# Patient Record
Sex: Female | Born: 1960 | Race: White | Hispanic: No | Marital: Married | Smoking: Never smoker
Health system: Southern US, Community
[De-identification: ages and names within clinical notes are randomized; demographics above are authoritative.]

## PROBLEM LIST (undated history)

## (undated) DIAGNOSIS — Z9889 Other specified postprocedural states: Secondary | ICD-10-CM

## (undated) DIAGNOSIS — E119 Type 2 diabetes mellitus without complications: Secondary | ICD-10-CM

## (undated) DIAGNOSIS — R112 Nausea with vomiting, unspecified: Secondary | ICD-10-CM

## (undated) DIAGNOSIS — I10 Essential (primary) hypertension: Secondary | ICD-10-CM

## (undated) HISTORY — PX: BUNIONECTOMY: SHX129

## (undated) HISTORY — DX: Essential (primary) hypertension: I10

## (undated) HISTORY — DX: Type 2 diabetes mellitus without complications: E11.9

---

## 2007-02-14 ENCOUNTER — Ambulatory Visit: Payer: Self-pay | Admitting: Family Medicine

## 2007-04-06 ENCOUNTER — Ambulatory Visit: Payer: Self-pay | Admitting: Family Medicine

## 2017-01-05 DIAGNOSIS — Z23 Encounter for immunization: Secondary | ICD-10-CM | POA: Diagnosis not present

## 2018-02-20 ENCOUNTER — Other Ambulatory Visit
Admission: RE | Admit: 2018-02-20 | Discharge: 2018-02-20 | Disposition: A | Discharge status: Home / Self Care | Payer: Self-pay | Source: Ambulatory Visit | Attending: Internal Medicine | Admitting: Internal Medicine

## 2018-02-20 DIAGNOSIS — M48061 Spinal stenosis, lumbar region without neurogenic claudication: Secondary | ICD-10-CM | POA: Diagnosis not present

## 2018-02-20 DIAGNOSIS — M546 Pain in thoracic spine: Secondary | ICD-10-CM | POA: Insufficient documentation

## 2018-02-20 DIAGNOSIS — R739 Hyperglycemia, unspecified: Secondary | ICD-10-CM | POA: Diagnosis not present

## 2018-02-20 DIAGNOSIS — I10 Essential (primary) hypertension: Secondary | ICD-10-CM | POA: Diagnosis not present

## 2018-02-20 LAB — FIBRIN DERIVATIVES D-DIMER (ARMC ONLY): Fibrin derivatives D-dimer (ARMC): 287.41 ng/mL (FEU) (ref 0.00–499.00)

## 2018-03-06 DIAGNOSIS — E119 Type 2 diabetes mellitus without complications: Secondary | ICD-10-CM | POA: Diagnosis not present

## 2018-03-06 DIAGNOSIS — I10 Essential (primary) hypertension: Secondary | ICD-10-CM | POA: Diagnosis not present

## 2018-03-06 DIAGNOSIS — M545 Low back pain: Secondary | ICD-10-CM | POA: Diagnosis not present

## 2018-03-27 ENCOUNTER — Encounter: Payer: Self-pay | Admitting: Internal Medicine

## 2018-03-27 ENCOUNTER — Encounter (INDEPENDENT_AMBULATORY_CARE_PROVIDER_SITE_OTHER): Payer: Self-pay

## 2018-03-27 ENCOUNTER — Ambulatory Visit: Payer: BLUE CROSS/BLUE SHIELD | Admitting: Internal Medicine

## 2018-03-27 VITALS — BP 170/90 | HR 108 | Temp 98.0°F | Ht 66.75 in | Wt 203.0 lb

## 2018-03-27 DIAGNOSIS — Z23 Encounter for immunization: Secondary | ICD-10-CM | POA: Diagnosis not present

## 2018-03-27 DIAGNOSIS — E1159 Type 2 diabetes mellitus with other circulatory complications: Secondary | ICD-10-CM

## 2018-03-27 DIAGNOSIS — E119 Type 2 diabetes mellitus without complications: Secondary | ICD-10-CM | POA: Insufficient documentation

## 2018-03-27 DIAGNOSIS — I10 Essential (primary) hypertension: Secondary | ICD-10-CM | POA: Insufficient documentation

## 2018-03-27 MED ORDER — METFORMIN HCL 500 MG PO TABS: 500.0000 mg | ORAL_TABLET | Freq: Two times a day (BID) | ORAL | 0 refills | Status: DC

## 2018-03-27 MED ORDER — LISINOPRIL-HYDROCHLOROTHIAZIDE 20-12.5 MG PO TABS: 2.0000 | ORAL_TABLET | Freq: Every day | ORAL | 0 refills | Status: DC

## 2018-03-27 NOTE — Addendum Note (Signed)
Addended by: Lurlean Nanny on: 03/27/2018 12:52 PM   Modules accepted: Orders

## 2018-03-27 NOTE — Assessment & Plan Note (Signed)
Uncontrolled Change Lisinopril 20 mg to Lisinopril 20-12.5 mg, 2 tabs PO daily Reinforced DASH diet and exercise for weight loss Will check CMET at next visit  RTC in 2 weeks for HTN followup

## 2018-03-27 NOTE — Patient Instructions (Signed)
Diabetes Mellitus and Standards of Medical Care Managing diabetes (diabetes mellitus) can be complicated. Your diabetes treatment may be managed by a team of health care providers, including:  A physician who specializes in diabetes (endocrinologist).  A nurse practitioner or physician assistant.  Nurses.  A diet and nutrition specialist (registered dietitian).  A certified diabetes educator (CDE).  An exercise specialist.  A pharmacist.  An eye doctor.  A foot specialist (podiatrist).  A dentist.  A primary care provider.  A mental health provider. Your health care providers follow guidelines to help you get the best quality of care. The following schedule is a general guideline for your diabetes management plan. Your health care providers may give you more specific instructions. Physical exams Upon being diagnosed with diabetes mellitus, and each year after that, your health care provider will ask about your medical and family history. He or she will also do a physical exam. Your exam may include:  Measuring your height, weight, and body mass index (BMI).  Checking your blood pressure. This will be done at every routine medical visit. Your target blood pressure may vary depending on your medical conditions, your age, and other factors.  Thyroid gland exam.  Skin exam.  Screening for damage to your nerves (peripheral neuropathy). This may include checking the pulse in your legs and feet and checking the level of sensation in your hands and feet.  A complete foot exam to inspect the structure and skin of your feet, including checking for cuts, bruises, redness, blisters, sores, or other problems.  Screening for blood vessel (vascular) problems, which may include checking the pulse in your legs and feet and checking your temperature. Blood tests Depending on your treatment plan and your personal needs, you may have the following tests done:  HbA1c (hemoglobin A1c). This  test provides information about blood sugar (glucose) control over the previous 2-3 months. It is used to adjust your treatment plan, if needed. This test will be done: ? At least 2 times a year, if you are meeting your treatment goals. ? 4 times a year, if you are not meeting your treatment goals or if treatment goals have changed.  Lipid testing, including total, LDL, and HDL cholesterol and triglyceride levels. ? The goal for LDL is less than 100 mg/dL (5.5 mmol/L). If you are at high risk for complications, the goal is less than 70 mg/dL (3.9 mmol/L). ? The goal for HDL is 40 mg/dL (2.2 mmol/L) or higher for men and 50 mg/dL (2.8 mmol/L) or higher for women. An HDL cholesterol of 60 mg/dL (3.3 mmol/L) or higher gives some protection against heart disease. ? The goal for triglycerides is less than 150 mg/dL (8.3 mmol/L).  Liver function tests.  Kidney function tests.  Thyroid function tests. Dental and eye exams  Visit your dentist two times a year.  If you have type 1 diabetes, your health care provider may recommend an eye exam 3-5 years after you are diagnosed, and then once a year after your first exam. ? For children with type 1 diabetes, a health care provider may recommend an eye exam when your child is age 10 or older and has had diabetes for 3-5 years. After the first exam, your child should get an eye exam once a year.  If you have type 2 diabetes, your health care provider may recommend an eye exam as soon as you are diagnosed, and then once a year after your first exam. Immunizations   The  your first exam.  Immunizations    The yearly flu (influenza) vaccine is recommended for everyone 6 months or older who has diabetes.  The pneumonia (pneumococcal) vaccine is recommended for everyone 2 years or older who has diabetes. If you are 65 or older, you may get the pneumonia vaccine as a series of two separate shots.  The hepatitis B vaccine is recommended for adults shortly after being diagnosed with diabetes.  Adults and children with diabetes should receive all other vaccines  according to age-specific recommendations from the Centers for Disease Control and Prevention (CDC).  Mental and emotional health  Screening for symptoms of eating disorders, anxiety, and depression is recommended at the time of diagnosis and afterward as needed. If your screening shows that you have symptoms (positive screening result), you may need more evaluation and you may work with a mental health care provider.  Treatment plan  Your treatment plan will be reviewed at every medical visit. You and your health care provider will discuss:  How you are taking your medicines, including insulin.  Any side effects you are experiencing.  Your blood glucose target goals.  The frequency of your blood glucose monitoring.  Lifestyle habits, such as activity level as well as tobacco, alcohol, and substance use.  Diabetes self-management education  Your health care provider will assess how well you are monitoring your blood glucose levels and whether you are taking your insulin correctly. He or she may refer you to:  A certified diabetes educator to manage your diabetes throughout your life, starting at diagnosis.  A registered dietitian who can create or review your personal nutrition plan.  An exercise specialist who can discuss your activity level and exercise plan.  Summary  Managing diabetes (diabetes mellitus) can be complicated. Your diabetes treatment may be managed by a team of health care providers.  Your health care providers follow guidelines in order to help you get the best quality of care.  Standards of care including having regular physical exams, blood tests, blood pressure monitoring, immunizations, screening tests, and education about how to manage your diabetes.  Your health care providers may also give you more specific instructions based on your individual health.  This information is not intended to replace advice given to you by your health care provider. Make sure you discuss any questions you have  with your health care provider.  Document Released: 01/10/2009 Document Revised: 12/02/2017 Document Reviewed: 12/12/2015  Elsevier Interactive Patient Education  2019 [employer]

## 2018-03-27 NOTE — Assessment & Plan Note (Signed)
Uncontrolled She is not interested in getting a meter at this time Continue Metformin, refilled today No microalbumin needed secondary to ACEI  therapy Discussed low carb diet and exercise for weight loss Foot exam today Flu and pneumovax today Encouraged her to make an appt for an annual eye exam

## 2018-03-27 NOTE — Progress Notes (Signed)
HPI  Pt presents to the clinic today to establish care and for management of the conditions listed below.  HTN: Diagnosed 01/2018. She is taking Lisinopril as prescribed. Her BP today is 170/90 . She has had a slight scratchy throat but denies cough. There is no ECG on file.  DM 2: New diagnosis 01/2018. A1C 11.8, 02/20/2018. She was started on Metformin 500 mg BID, which she has been taking as prescribed. She has had some mild GI upset but nothing that is worrisome. She is not checking her sugars. She reports numbness and tingling in hands and feet. She denies visual changes, increased thirst or urinary frequency. She checks her feet daily. She has never had a pneumonia shot. Her last eye exam was > 1 year ago.  Flu: 12/2016 Tetanus: > 10 years ago Pneumovax: never Pap Smear: > 5 years ago Mammogram: > 5 years ago Colon Screening: never Vision Screening: as needed Dentist: biannually  Past Medical History:  Diagnosis Date  . Diabetes mellitus without complication ([place])   . Hypertension     Current Outpatient Medications  Medication Sig Dispense Refill  . lisinopril (PRINIVIL,ZESTRIL) 20 MG tablet Take by mouth.    . metFORMIN (GLUCOPHAGE) 500 MG tablet Take 500 mg by mouth 2 (two) times daily with a meal.      No current facility-administered medications for this visit.     No Known Allergies  Family History  Problem Relation Age of Onset  . Heart disease Father   . Heart attack Father   . Heart disease Maternal Grandfather     Social History   Socioeconomic History  . Marital status: Single    Spouse name: Not on file  . Number of children: Not on file  . Years of education: Not on file  . Highest education level: Not on file  Occupational History  . Not on file  Social Needs  . Financial resource strain: Not on file  . Food insecurity:    Worry: Not on file    Inability: Not on file  . Transportation needs:    Medical: Not on file    Non-medical: Not on  file  Tobacco Use  . Smoking status: Never Smoker  . Smokeless tobacco: Never Used  Substance and Sexual Activity  . Alcohol use: Yes    Comment: recently quit 01/2018, 3-4 beers QD  . Drug use: Never  . Sexual activity: Not on file  Lifestyle  . Physical activity:    Days per week: Not on file    Minutes per session: Not on file  . Stress: Not on file  Relationships  . Social connections:    Talks on phone: Not on file    Gets together: Not on file    Attends religious service: Not on file    Active member of club or organization: Not on file    Attends meetings of clubs or organizations: Not on file    Relationship status: Not on file  . Intimate partner violence:    Fear of current or ex partner: Not on file    Emotionally abused: Not on file    Physically abused: Not on file    Forced sexual activity: Not on file  Other Topics Concern  . Not on file  Social History Narrative  . Not on file    ROS:  Constitutional: Pt reports intermittent fatigue. Denies fever, malaise, headache or abrupt weight changes.  HEENT: Denies eye pain, eye redness, ear pain,  ringing in the ears, wax buildup, runny nose, nasal congestion, bloody nose, or sore throat. Respiratory: Denies difficulty breathing, shortness of breath, cough or sputum production.   Cardiovascular: Denies chest pain, chest tightness, palpitations or swelling in the hands or feet.  Gastrointestinal: Denies abdominal pain, bloating, constipation, diarrhea or blood in the stool.  GU: Denies frequency, urgency, pain with urination, blood in urine, odor or discharge. Musculoskeletal: Denies decrease in range of motion, difficulty with gait, muscle pain or joint pain and swelling.  Skin: Denies redness, rashes, lesions or ulcercations.  Neurological: Pt reports intermittent lightheadedness, numbness and tingling of hands and feet. Denies dizziness, difficulty with memory, difficulty with speech or problems with balance and  coordination.  Psych: Denies anxiety, depression, SI/HI.  No other specific complaints in a complete review of systems (except as listed in HPI above).  PE:  BP (!) 170/90   Pulse (!) 108   Temp 98 F (36.7 C) (Oral)   Ht 5' 6.75" (1.695 m)   Wt 203 lb (92.1 kg)   SpO2 98%   BMI 32.03 kg/m   Wt Readings from Last 3 Encounters:  03/27/18 203 lb (92.1 kg)    General: Appears her stated age, obese, in NAD. Skin: Vascular changes noted of BLE. Scab noted of left lateral lower leg. HEENT: Head: normal shape and size; Eyes: sclera white, no icterus, conjunctiva pink, PERRLA and EOMs intact;  Cardiovascular: Normal rate and rhythm. S1,S2 noted.  No murmur, rubs or gallops noted. No JVD or BLE edema. No carotid bruits noted. Pulmonary/Chest: Normal effort and positive vesicular breath sounds. No respiratory distress. No wheezes, rales or ronchi noted.  Abdomen: Soft and nontender. Normal bowel sounds. Neurological: Alert and oriented. Sensation intact to BLE. Psychiatric: Mood and affect normal. Behavior is normal. Judgment and thought content normal.     Assessment and Plan:

## 2018-04-10 ENCOUNTER — Ambulatory Visit: Payer: BLUE CROSS/BLUE SHIELD | Admitting: Internal Medicine

## 2018-04-10 ENCOUNTER — Encounter: Payer: Self-pay | Admitting: Internal Medicine

## 2018-04-10 VITALS — BP 160/90 | HR 105 | Temp 98.1°F | Wt 198.0 lb

## 2018-04-10 DIAGNOSIS — I10 Essential (primary) hypertension: Secondary | ICD-10-CM

## 2018-04-10 LAB — BASIC METABOLIC PANEL
BUN: 29 mg/dL — AB (ref 6–23)
CALCIUM: 9.2 mg/dL (ref 8.4–10.5)
CO2: 29 mEq/L (ref 19–32)
CREATININE: 0.91 mg/dL (ref 0.40–1.20)
Chloride: 93 mEq/L — ABNORMAL LOW (ref 96–112)
GFR: 67.6 mL/min (ref >60.00)
Glucose, Bld: 481 mg/dL — ABNORMAL HIGH (ref 70–99)
Potassium: 4.2 mEq/L (ref 3.5–5.1)
Sodium: 130 mEq/L — ABNORMAL LOW (ref 135–145)

## 2018-04-10 MED ORDER — LOSARTAN POTASSIUM 50 MG PO TABS: 50.0000 mg | ORAL_TABLET | Freq: Every day | ORAL | 0 refills | Status: DC

## 2018-04-10 NOTE — Progress Notes (Signed)
Subjective:    Patient ID: Rebecca Park, female    DOB: [birth year], 58 y.o.   MRN: [id number]  HPI  Pt presents to the clinic today for 2 week follow up of HTN. At her last visit, her Lisinopril was changed to Lisinopril 20-12.5 mg tabs, take 2 tabs daily. She has been taking the medication as prescribed. She denies adverse side effects. Her BP today is 160/90. She reports she took her blood pressure medication about 1 hour ago. There is no ECG on file but she recently had one at [facility].  Review of Systems      Past Medical History:  Diagnosis Date  . Diabetes mellitus without complication ([place])   . Hypertension     Current Outpatient Medications  Medication Sig Dispense Refill  . lisinopril (PRINIVIL,ZESTRIL) 20 MG tablet Take by mouth.    Marland Kitchen lisinopril-hydrochlorothiazide (ZESTORETIC) 20-12.5 MG tablet Take 2 tablets by mouth daily. 60 tablet 0  . metFORMIN (GLUCOPHAGE) 500 MG tablet Take 1 tablet (500 mg total) by mouth 2 (two) times daily with a meal. 180 tablet 0   No current facility-administered medications for this visit.     No Known Allergies  Family History  Problem Relation Age of Onset  . Heart disease Father   . Heart attack Father   . Heart disease Maternal Grandfather     Social History   Socioeconomic History  . Marital status: Single    Spouse name: Not on file  . Number of children: Not on file  . Years of education: Not on file  . Highest education level: Not on file  Occupational History  . Not on file  Social Needs  . Financial resource strain: Not on file  . Food insecurity:    Worry: Not on file    Inability: Not on file  . Transportation needs:    Medical: Not on file    Non-medical: Not on file  Tobacco Use  . Smoking status: Never Smoker  . Smokeless tobacco: Never Used  Substance and Sexual Activity  . Alcohol use: Yes    Comment: recently quit 01/2018, 3-4 beers QD  . Drug use: Never  . Sexual activity: Not on file    Lifestyle  . Physical activity:    Days per week: Not on file    Minutes per session: Not on file  . Stress: Not on file  Relationships  . Social connections:    Talks on phone: Not on file    Gets together: Not on file    Attends religious service: Not on file    Active member of club or organization: Not on file    Attends meetings of clubs or organizations: Not on file    Relationship status: Not on file  . Intimate partner violence:    Fear of current or ex partner: Not on file    Emotionally abused: Not on file    Physically abused: Not on file    Forced sexual activity: Not on file  Other Topics Concern  . Not on file  Social History Narrative  . Not on file     Constitutional: Pt reports fatigue. Denies fever, malaise, headache or abrupt weight changes.  Respiratory: Denies difficulty breathing, shortness of breath, cough or sputum production.   Cardiovascular: Denies chest pain, chest tightness, palpitations or swelling in the hands or feet.  Neurological: Denies dizziness, difficulty with memory, difficulty with speech or problems with balance and coordination.  No other specific complaints in a complete review of systems (except as listed in HPI above).  Objective:   Physical Exam   BP (!) 160/90   Pulse (!) 105   Temp 98.1 F (36.7 C) (Oral)   Wt 198 lb (89.8 kg)   SpO2 98%   BMI 31.24 kg/m  Wt Readings from Last 3 Encounters:  04/10/18 198 lb (89.8 kg)  03/27/18 203 lb (92.1 kg)    General: Appears her stated age, obese, in NAD. Cardiovascular: Tachycardic with normal rhythm. S1,S2 noted.  No murmur, rubs or gallops noted. No JVD or BLE edema.  Pulmonary/Chest: Normal effort and positive vesicular breath sounds. No respiratory distress. No wheezes, rales or ronchi noted.  Neurological: Alert and oriented.       Assessment & Plan:

## 2018-04-10 NOTE — Assessment & Plan Note (Signed)
Uncontrolled on Lisinopril HCT Will change to Losartan 50 mg daily Reinforced DASH diet and exercise for weight loss BMET today Will request record of ECG from [facility]  RTC in 2 weeks for BP check

## 2018-04-10 NOTE — Patient Instructions (Signed)

## 2018-04-12 ENCOUNTER — Encounter: Payer: Self-pay | Admitting: Internal Medicine

## 2018-04-13 ENCOUNTER — Encounter: Payer: Self-pay | Admitting: Internal Medicine

## 2018-04-13 MED ORDER — METFORMIN HCL 1000 MG PO TABS: 1000.0000 mg | ORAL_TABLET | Freq: Two times a day (BID) | ORAL | 0 refills | Status: DC

## 2018-04-19 ENCOUNTER — Other Ambulatory Visit: Payer: Self-pay | Admitting: Internal Medicine

## 2018-04-24 ENCOUNTER — Encounter: Payer: Self-pay | Admitting: Internal Medicine

## 2018-04-24 ENCOUNTER — Ambulatory Visit: Payer: BLUE CROSS/BLUE SHIELD | Admitting: Internal Medicine

## 2018-04-24 VITALS — BP 152/94 | HR 100 | Temp 98.2°F | Wt 203.0 lb

## 2018-04-24 DIAGNOSIS — I10 Essential (primary) hypertension: Secondary | ICD-10-CM | POA: Diagnosis not present

## 2018-04-24 MED ORDER — LOSARTAN POTASSIUM 100 MG PO TABS: 100.0000 mg | ORAL_TABLET | Freq: Every day | ORAL | 1 refills | Status: DC

## 2018-04-24 MED ORDER — AMLODIPINE BESYLATE 5 MG PO TABS: 5.0000 mg | ORAL_TABLET | Freq: Every day | ORAL | 1 refills | Status: DC

## 2018-04-24 NOTE — Patient Instructions (Signed)

## 2018-04-24 NOTE — Assessment & Plan Note (Signed)
Still not controlled Continue Losartan 100 mg daily, RX sent to pharmacy Consider changing to Losartan- HCT if edema persists or worsens Add Amlodipine 5 mg daily, RX sent to pharmacy Will obtain US to check for renal artery stenosis Reinforced DASH diet and exercise for weight loss Update me via mychart in 1 week with BP readings  RTC in 1 month for your annual exam

## 2018-04-24 NOTE — Progress Notes (Signed)
Subjective:    Patient ID: Rebecca Park, female    DOB: 1961-02-16, 58 y.o.   MRN: [id number]  HPI  Pt presents to the clinic today for follow up of HTN. At her last visit, she was started on Losartan 50 mg. She initially felt a little lightheaded and felt like her blood pressures were low, she she stopped the medication after only a few days. I advised her to cut Losartan in half, but then she noticed her blood pressures were elevated. She increased her Losartan to 50 mg and BP at home was 152/90, 170/92. I advised her to start taking 2 tabs of Losartan. She has been taking the medication as prescribed. She denies adverse side effects. She has noticed some mild swelling in her legs since coming off the Lisinopril HCT but it is not painful. She denies cough or SOB. Her BP today is 152/94. There is no ECG on file.   Review of Systems       Past Medical History:  Diagnosis Date  . Diabetes mellitus without complication ([place])   . Hypertension     Current Outpatient Medications  Medication Sig Dispense Refill  . losartan (COZAAR) 50 MG tablet Take 1 tablet (50 mg total) by mouth daily. 30 tablet 0  . metFORMIN (GLUCOPHAGE) 1000 MG tablet Take 1 tablet (1,000 mg total) by mouth 2 (two) times daily with a meal. 180 tablet 0   No current facility-administered medications for this visit.     No Known Allergies  Family History  Problem Relation Age of Onset  . Heart disease Father   . Heart attack Father   . Heart disease Maternal Grandfather     Social History   Socioeconomic History  . Marital status: Single    Spouse name: Not on file  . Number of children: Not on file  . Years of education: Not on file  . Highest education level: Not on file  Occupational History  . Not on file  Social Needs  . Financial resource strain: Not on file  . Food insecurity:    Worry: Not on file    Inability: Not on file  . Transportation needs:    Medical: Not on file    Non-medical: Not  on file  Tobacco Use  . Smoking status: Never Smoker  . Smokeless tobacco: Never Used  Substance and Sexual Activity  . Alcohol use: Yes    Comment: recently quit 01/2018, 3-4 beers QD  . Drug use: Never  . Sexual activity: Not on file  Lifestyle  . Physical activity:    Days per week: Not on file    Minutes per session: Not on file  . Stress: Not on file  Relationships  . Social connections:    Talks on phone: Not on file    Gets together: Not on file    Attends religious service: Not on file    Active member of club or organization: Not on file    Attends meetings of clubs or organizations: Not on file    Relationship status: Not on file  . Intimate partner violence:    Fear of current or ex partner: Not on file    Emotionally abused: Not on file    Physically abused: Not on file    Forced sexual activity: Not on file  Other Topics Concern  . Not on file  Social History Narrative  . Not on file     Constitutional: Denies fever, malaise,  fatigue, headache or abrupt weight changes.  Respiratory: Denies difficulty breathing, shortness of breath, cough or sputum production.   Cardiovascular: Pt reports mild swelling in legs. Denies chest pain, chest tightness, palpitations or swelling in the hands.  Neurological: Denies dizziness, difficulty with memory, difficulty with speech or problems with balance and coordination.    No other specific complaints in a complete review of systems (except as listed in HPI above).  Objective:   Physical Exam  BP (!) 152/94   Pulse 100   Temp 98.2 F (36.8 C) (Oral)   Wt 203 lb (92.1 kg)   SpO2 98%   BMI 32.03 kg/m  Wt Readings from Last 3 Encounters:  04/24/18 203 lb (92.1 kg)  04/10/18 198 lb (89.8 kg)  03/27/18 203 lb (92.1 kg)    General: Appears her stated age, obese, in NAD. Cardiovascular: Tachycardic with normal rhythm. S1,S2 noted.  No murmur, rubs or gallops noted. Trace BLE edema. Pulmonary/Chest: Normal effort and  positive vesicular breath sounds. No respiratory distress. No wheezes, rales or ronchi noted.  Neurological: Alert and oriented.    BMET    Component Value Date/Time   NA 130 (L) 04/10/2018 0828   K 4.2 04/10/2018 0828   CL 93 (L) 04/10/2018 0828   CO2 29 04/10/2018 0828   GLUCOSE 481 (H) 04/10/2018 0828   BUN 29 (H) 04/10/2018 0828   CREATININE 0.91 04/10/2018 0828   CALCIUM 9.2 04/10/2018 0828    Lipid Panel  No results found for: CHOL, TRIG, HDL, CHOLHDL, VLDL, LDLCALC  CBC No results found for: WBC, RBC, HGB, HCT, PLT, MCV, MCH, MCHC, RDW, LYMPHSABS, MONOABS, EOSABS, BASOSABS  Hgb A1C No results found for: HGBA1C          Assessment & Plan:

## 2018-04-25 ENCOUNTER — Encounter: Payer: Self-pay | Admitting: Internal Medicine

## 2018-04-28 ENCOUNTER — Encounter: Payer: Self-pay | Admitting: Internal Medicine

## 2018-05-02 ENCOUNTER — Other Ambulatory Visit: Payer: Self-pay | Admitting: Internal Medicine

## 2018-05-16 ENCOUNTER — Other Ambulatory Visit: Payer: Self-pay | Admitting: Internal Medicine

## 2018-06-06 ENCOUNTER — Ambulatory Visit (INDEPENDENT_AMBULATORY_CARE_PROVIDER_SITE_OTHER): Payer: BLUE CROSS/BLUE SHIELD | Admitting: Internal Medicine

## 2018-06-06 ENCOUNTER — Encounter: Payer: Self-pay | Admitting: Internal Medicine

## 2018-06-06 VITALS — BP 146/100 | HR 110 | Temp 97.9°F | Ht 66.75 in | Wt 212.0 lb

## 2018-06-06 DIAGNOSIS — Z114 Encounter for screening for human immunodeficiency virus [HIV]: Secondary | ICD-10-CM

## 2018-06-06 DIAGNOSIS — E1159 Type 2 diabetes mellitus with other circulatory complications: Secondary | ICD-10-CM

## 2018-06-06 DIAGNOSIS — Z0001 Encounter for general adult medical examination with abnormal findings: Secondary | ICD-10-CM | POA: Diagnosis not present

## 2018-06-06 DIAGNOSIS — I10 Essential (primary) hypertension: Secondary | ICD-10-CM | POA: Diagnosis not present

## 2018-06-06 DIAGNOSIS — Z1211 Encounter for screening for malignant neoplasm of colon: Secondary | ICD-10-CM | POA: Diagnosis not present

## 2018-06-06 DIAGNOSIS — Z23 Encounter for immunization: Secondary | ICD-10-CM

## 2018-06-06 DIAGNOSIS — Z1239 Encounter for other screening for malignant neoplasm of breast: Secondary | ICD-10-CM

## 2018-06-06 DIAGNOSIS — Z1159 Encounter for screening for other viral diseases: Secondary | ICD-10-CM

## 2018-06-06 LAB — COMPREHENSIVE METABOLIC PANEL
ALT: 20 U/L (ref 0–35)
AST: 14 U/L (ref 0–37)
Albumin: 4.2 g/dL (ref 3.5–5.2)
Alkaline Phosphatase: 139 U/L — ABNORMAL HIGH (ref 39–117)
BUN: 19 mg/dL (ref 6–23)
CO2: 26 mEq/L (ref 19–32)
Calcium: 9.4 mg/dL (ref 8.4–10.5)
Chloride: 98 mEq/L (ref 96–112)
Creatinine, Ser: 0.64 mg/dL (ref 0.40–1.20)
GFR: 95.42 mL/min (ref >60.00)
GLUCOSE: 361 mg/dL — AB (ref 70–99)
Potassium: 4 mEq/L (ref 3.5–5.1)
Sodium: 133 mEq/L — ABNORMAL LOW (ref 135–145)
Total Bilirubin: 0.6 mg/dL (ref 0.2–1.2)
Total Protein: 7.2 g/dL (ref 6.0–8.3)

## 2018-06-06 LAB — LIPID PANEL
CHOL/HDL RATIO: 3
Cholesterol: 245 mg/dL — ABNORMAL HIGH (ref 0–200)
HDL: 75.7 mg/dL (ref >39.00)
LDL Cholesterol: 150 mg/dL — ABNORMAL HIGH (ref 0–99)
NonHDL: 169.2
Triglycerides: 98 mg/dL (ref 0.0–149.0)
VLDL: 19.6 mg/dL (ref 0.0–40.0)

## 2018-06-06 LAB — VITAMIN D 25 HYDROXY (VIT D DEFICIENCY, FRACTURES): VITD: 18.99 ng/mL — ABNORMAL LOW (ref 30.00–100.00)

## 2018-06-06 LAB — CBC
HCT: 39.4 % (ref 36.0–46.0)
Hemoglobin: 13.4 g/dL (ref 12.0–15.0)
MCHC: 34 g/dL (ref 30.0–36.0)
MCV: 87.3 fl (ref 78.0–100.0)
Platelets: 155 10*3/uL (ref 150.0–400.0)
RBC: 4.52 Mil/uL (ref 3.87–5.11)
RDW: 13.6 % (ref 11.5–15.5)
WBC: 7.2 10*3/uL (ref 4.0–10.5)

## 2018-06-06 LAB — HEMOGLOBIN A1C: Hgb A1c MFr Bld: 11.1 % — ABNORMAL HIGH (ref 4.6–6.5)

## 2018-06-06 MED ORDER — HYDROCHLOROTHIAZIDE 25 MG PO TABS: 25.0000 mg | ORAL_TABLET | Freq: Every day | ORAL | 3 refills | Status: DC

## 2018-06-06 NOTE — Patient Instructions (Signed)
Health Maintenance for Postmenopausal Women Menopause is a normal process in which your reproductive ability comes to an end. This process happens gradually over a span of months to years, usually between the ages of 62 and 89. Menopause is complete when you have missed 12 consecutive menstrual periods. It is important to talk with your health care provider about some of the most common conditions that affect postmenopausal women, such as heart disease, cancer, and bone loss (osteoporosis). Adopting a healthy lifestyle and getting preventive care can help to promote your health and wellness. Those actions can also lower your chances of developing some of these common conditions. What should I know about menopause? During menopause, you may experience a number of symptoms, such as:  Moderate-to-severe hot flashes.  Night sweats.  Decrease in sex drive.  Mood swings.  Headaches.  Tiredness.  Irritability.  Memory problems.  Insomnia. Choosing to treat or not to treat menopausal changes is an individual decision that you make with your health care provider. What should I know about hormone replacement therapy and supplements? Hormone therapy products are effective for treating symptoms that are associated with menopause, such as hot flashes and night sweats. Hormone replacement carries certain risks, especially as you become older. If you are thinking about using estrogen or estrogen with progestin treatments, discuss the benefits and risks with your health care provider. What should I know about heart disease and stroke? Heart disease, heart attack, and stroke become more likely as you age. This may be due, in part, to the hormonal changes that your body experiences during menopause. These can affect how your body processes dietary fats, triglycerides, and cholesterol. Heart attack and stroke are both medical emergencies. There are many things that you can do to help prevent heart disease  and stroke:  Have your blood pressure checked at least every 1-2 years. High blood pressure causes heart disease and increases the risk of stroke.  If you are 79-72 years old, ask your health care provider if you should take aspirin to prevent a heart attack or a stroke.  Do not use any tobacco products, including cigarettes, chewing tobacco, or electronic cigarettes. If you need help quitting, ask your health care provider.  It is important to eat a healthy diet and maintain a healthy weight. ? Be sure to include plenty of vegetables, fruits, low-fat dairy products, and lean protein. ? Avoid eating foods that are high in solid fats, added sugars, or salt (sodium).  Get regular exercise. This is one of the most important things that you can do for your health. ? Try to exercise for at least 150 minutes each week. The type of exercise that you do should increase your heart rate and make you sweat. This is known as moderate-intensity exercise. ? Try to do strengthening exercises at least twice each week. Do these in addition to the moderate-intensity exercise.  Know your numbers.Ask your health care provider to check your cholesterol and your blood glucose. Continue to have your blood tested as directed by your health care provider.  What should I know about cancer screening? There are several types of cancer. Take the following steps to reduce your risk and to catch any cancer development as early as possible. Breast Cancer  Practice breast self-awareness. ? This means understanding how your breasts normally appear and feel. ? It also means doing regular breast self-exams. Let your health care provider know about any changes, no matter how small.  If you are 40 or  older, have a clinician do a breast exam (clinical breast exam or CBE) every year. Depending on your age, family history, and medical history, it may be recommended that you also have a yearly breast X-ray (mammogram).  If you  have a family history of breast cancer, talk with your health care provider about genetic screening.  If you are at high risk for breast cancer, talk with your health care provider about having an MRI and a mammogram every year.  Breast cancer (BRCA) gene test is recommended for women who have family members with BRCA-related cancers. Results of the assessment will determine the need for genetic counseling and BRCA1 and for BRCA2 testing. BRCA-related cancers include these types: ? Breast. This occurs in males or females. ? Ovarian. ? Tubal. This may also be called fallopian tube cancer. ? Cancer of the abdominal or pelvic lining (peritoneal cancer). ? Prostate. ? Pancreatic. Cervical, Uterine, and Ovarian Cancer Your health care provider may recommend that you be screened regularly for cancer of the pelvic organs. These include your ovaries, uterus, and vagina. This screening involves a pelvic exam, which includes checking for microscopic changes to the surface of your cervix (Pap test).  For women ages 21-65, health care providers may recommend a pelvic exam and a Pap test every three years. For women ages 39-65, they may recommend the Pap test and pelvic exam, combined with testing for human papilloma virus (HPV), every five years. Some types of HPV increase your risk of cervical cancer. Testing for HPV may also be done on women of any age who have unclear Pap test results.  Other health care providers may not recommend any screening for nonpregnant women who are considered low risk for pelvic cancer and have no symptoms. Ask your health care provider if a screening pelvic exam is right for you.  If you have had past treatment for cervical cancer or a condition that could lead to cancer, you need Pap tests and screening for cancer for at least 20 years after your treatment. If Pap tests have been discontinued for you, your risk factors (such as having a new sexual partner) need to be reassessed  to determine if you should start having screenings again. Some women have medical problems that increase the chance of getting cervical cancer. In these cases, your health care provider may recommend that you have screening and Pap tests more often.  If you have a family history of uterine cancer or ovarian cancer, talk with your health care provider about genetic screening.  If you have vaginal bleeding after reaching menopause, tell your health care provider.  There are currently no reliable tests available to screen for ovarian cancer. Lung Cancer Lung cancer screening is recommended for adults 57-50 years old who are at high risk for lung cancer because of a history of smoking. A yearly low-dose CT scan of the lungs is recommended if you:  Currently smoke.  Have a history of at least 30 pack-years of smoking and you currently smoke or have quit within the past 15 years. A pack-year is smoking an average of one pack of cigarettes per day for one year. Yearly screening should:  Continue until it has been 15 years since you quit.  Stop if you develop a health problem that would prevent you from having lung cancer treatment. Colorectal Cancer  This type of cancer can be detected and can often be prevented.  Routine colorectal cancer screening usually begins at age 12 and continues through  age 75.  If you have risk factors for colon cancer, your health care provider may recommend that you be screened at an earlier age.  If you have a family history of colorectal cancer, talk with your health care provider about genetic screening.  Your health care provider may also recommend using home test kits to check for hidden blood in your stool.  A small camera at the end of a tube can be used to examine your colon directly (sigmoidoscopy or colonoscopy). This is done to check for the earliest forms of colorectal cancer.  Direct examination of the colon should be repeated every 5-10 years until  age 75. However, if early forms of precancerous polyps or small growths are found or if you have a family history or genetic risk for colorectal cancer, you may need to be screened more often. Skin Cancer  Check your skin from head to toe regularly.  Monitor any moles. Be sure to tell your health care provider: ? About any new moles or changes in moles, especially if there is a change in a mole's shape or color. ? If you have a mole that is larger than the size of a pencil eraser.  If any of your family members has a history of skin cancer, especially at a young age, talk with your health care provider about genetic screening.  Always use sunscreen. Apply sunscreen liberally and repeatedly throughout the day.  Whenever you are outside, protect yourself by wearing long sleeves, pants, a wide-brimmed hat, and sunglasses. What should I know about osteoporosis? Osteoporosis is a condition in which bone destruction happens more quickly than new bone creation. After menopause, you may be at an increased risk for osteoporosis. To help prevent osteoporosis or the bone fractures that can happen because of osteoporosis, the following is recommended:  If you are 19-50 years old, get at least 1,000 mg of calcium and at least 600 mg of vitamin D per day.  If you are older than age 50 but younger than age 70, get at least 1,200 mg of calcium and at least 600 mg of vitamin D per day.  If you are older than age 70, get at least 1,200 mg of calcium and at least 800 mg of vitamin D per day. Smoking and excessive alcohol intake increase the risk of osteoporosis. Eat foods that are rich in calcium and vitamin D, and do weight-bearing exercises several times each week as directed by your health care provider. What should I know about how menopause affects my mental health? Depression may occur at any age, but it is more common as you become older. Common symptoms of depression include:  Low or sad  mood.  Changes in sleep patterns.  Changes in appetite or eating patterns.  Feeling an overall lack of motivation or enjoyment of activities that you previously enjoyed.  Frequent crying spells. Talk with your health care provider if you think that you are experiencing depression. What should I know about immunizations? It is important that you get and maintain your immunizations. These include:  Tetanus, diphtheria, and pertussis (Tdap) booster vaccine.  Influenza every year before the flu season begins.  Pneumonia vaccine.  Shingles vaccine. Your health care provider may also recommend other immunizations. This information is not intended to replace advice given to you by your health care provider. Make sure you discuss any questions you have with your health care provider. Document Released: 05/07/2005 Document Revised: 10/03/2015 Document Reviewed: 12/17/2014 Elsevier Interactive Patient Education    2019 [place].

## 2018-06-06 NOTE — Assessment & Plan Note (Signed)
BP's much better at home, she will continue to monitor Reinforced DASH diet and exercise for weight loss CBC and CMET today Continue Losartan and Amlodipine Add HCTZ 25 mg for edema

## 2018-06-06 NOTE — Progress Notes (Signed)
Subjective:    Patient ID: Rebecca Park, female    DOB: Jul 09, 1960, 58 y.o.   MRN: [id number]  HPI  Pt presents to the clinic today for her annual exam.  HTN: Her BP today is 148/96. Her BP runs 120-130/70-80's. She seems to have an element of [place] HTN. She is taking Losartan and Amlodipine as prescribed. She denies side effects. There is no ECG of file.  DM 2: Her last A1C was 11.8, 01/2018. She stopped taking Metformin 1 week ago due to GI upset. She is not checking her sugars. She checks her feet routinely. She has not had an eye exam in a long time.  Flu: 02/2018 Tetanus: > 10 years ago Pneumovax: 02/2018 Pap Smear: > 5 years ago Mammogram: > 5 years ago Colon Screening: never Vision Screening: as needed Dentist: biannually  Diet: She does eat meat. She consumes fruits and veggies daily. She tries to avoid fried foods. She drinks mostly water, Diet Coke Exercise: none  Review of Systems      Past Medical History:  Diagnosis Date  . Diabetes mellitus without complication ([place])   . Hypertension     Current Outpatient Medications  Medication Sig Dispense Refill  . amLODipine (NORVASC) 5 MG tablet TAKE 1 TABLET BY MOUTH EVERY DAY 30 tablet 0  . losartan (COZAAR) 100 MG tablet TAKE 1 TABLET BY MOUTH EVERY DAY 30 tablet 0  . Multiple Vitamins-Minerals (WOMENS MULTIVITAMIN PO) Take by mouth.    . metFORMIN (GLUCOPHAGE) 1000 MG tablet Take 1 tablet (1,000 mg total) by mouth 2 (two) times daily with a meal. (Patient not taking: Reported on 06/06/2018) 180 tablet 0   No current facility-administered medications for this visit.     No Known Allergies  Family History  Problem Relation Age of Onset  . Heart disease Father   . Heart attack Father   . Heart disease Maternal Grandfather     Social History   Socioeconomic History  . Marital status: Single    Spouse name: Not on file  . Number of children: Not on file  . Years of education: Not on file  . Highest  education level: Not on file  Occupational History  . Not on file  Social Needs  . Financial resource strain: Not on file  . Food insecurity:    Worry: Not on file    Inability: Not on file  . Transportation needs:    Medical: Not on file    Non-medical: Not on file  Tobacco Use  . Smoking status: Never Smoker  . Smokeless tobacco: Never Used  Substance and Sexual Activity  . Alcohol use: Yes    Comment: recently quit 01/2018, 3-4 beers QD  . Drug use: Never  . Sexual activity: Not on file  Lifestyle  . Physical activity:    Days per week: Not on file    Minutes per session: Not on file  . Stress: Not on file  Relationships  . Social connections:    Talks on phone: Not on file    Gets together: Not on file    Attends religious service: Not on file    Active member of club or organization: Not on file    Attends meetings of clubs or organizations: Not on file    Relationship status: Not on file  . Intimate partner violence:    Fear of current or ex partner: Not on file    Emotionally abused: Not on file  Physically abused: Not on file    Forced sexual activity: Not on file  Other Topics Concern  . Not on file  Social History Narrative  . Not on file     Constitutional: Denies fever, malaise, fatigue, headache or abrupt weight changes.  HEENT: Denies eye pain, eye redness, ear pain, ringing in the ears, wax buildup, runny nose, nasal congestion, bloody nose, or sore throat. Respiratory: Denies difficulty breathing, shortness of breath, cough or sputum production.   Cardiovascular: Denies chest pain, chest tightness, palpitations or swelling in the hands or feet.  Gastrointestinal: Denies abdominal pain, bloating, constipation, diarrhea or blood in the stool.  GU: Denies urgency, frequency, pain with urination, burning sensation, blood in urine, odor or discharge. Musculoskeletal: Denies decrease in range of motion, difficulty with gait, muscle pain or joint pain and  swelling.  Skin: Denies redness, rashes, lesions or ulcercations.  Neurological: Denies dizziness, difficulty with memory, difficulty with speech or problems with balance and coordination.  Psych: Denies anxiety, depression, SI/HI.  No other specific complaints in a complete review of systems (except as listed in HPI above).  Objective:   Physical Exam  BP (!) 148/96   Temp 97.9 F (36.6 C) (Oral)   Ht 5' 6.75" (1.695 m)   Wt 212 lb (96.2 kg)   SpO2 98%   BMI 33.45 kg/m  Wt Readings from Last 3 Encounters:  06/06/18 212 lb (96.2 kg)  04/24/18 203 lb (92.1 kg)  04/10/18 198 lb (89.8 kg)    General: Appears her stated age, obese, in NAD. Skin: Warm, dry and intact. PVD changes noted of BLE. HEENT: Head: normal shape and size; Eyes: sclera white, no icterus, conjunctiva pink, PERRLA and EOMs intact; Ears: Tm's gray and intact, normal light reflex; Nose: mucosa pink and moist, septum midline; Throat/Mouth: Teeth present, mucosa pink and moist, no exudate, lesions or ulcerations noted.  Neck:  Neck supple, trachea midline. No masses, lumps or thyromegaly present.  Cardiovascular: Normal rate and rhythm. S1,S2 noted.  No murmur, rubs or gallops noted. 1+ ptting BLE edema. No carotid bruits noted. Pulmonary/Chest: Normal effort and positive vesicular breath sounds. No respiratory distress. No wheezes, rales or ronchi noted.  Abdomen: Soft and nontender. Normal bowel sounds. No distention or masses noted.  Musculoskeletal: Strength 5/5 BUE/BLE. No difficulty with gait.  Neurological: Alert and oriented. Cranial nerves II-XII grossly intact. Coordination normal.  Psychiatric: Mood and affect normal. Mildly anxious appearing. Judgment and thought content normal.     BMET    Component Value Date/Time   NA 130 (L) 04/10/2018 0828   K 4.2 04/10/2018 0828   CL 93 (L) 04/10/2018 0828   CO2 29 04/10/2018 0828   GLUCOSE 481 (H) 04/10/2018 0828   BUN 29 (H) 04/10/2018 0828   CREATININE  0.91 04/10/2018 0828   CALCIUM 9.2 04/10/2018 0828    Lipid Panel  No results found for: CHOL, TRIG, HDL, CHOLHDL, VLDL, LDLCALC  CBC No results found for: WBC, RBC, HGB, HCT, PLT, MCV, MCH, MCHC, RDW, LYMPHSABS, MONOABS, EOSABS, BASOSABS  Hgb A1C No results found for: HGBA1C          Assessment & Plan:   Preventative Health Maintenance:  Flu shot UTD Tdap today Pneumovax UTD She will call to schedule pap smear Mammogram ordered, she will call [facility] to schedule Referral to GI for screening colonoscopy Encouraged her to consume a balanced diet and exercise regimen Advised her to see an eye doctor and dentist annually Will check CBC,  CMET, Lipid, A1C, HIV, Hep C and Vit D today  Will follow up after labs, RTC in 3 months for follow up DM 2 Webb Silversmith, NP

## 2018-06-06 NOTE — Assessment & Plan Note (Signed)
A1C today No microalbumin secondary to ARB therapy Encouraged her to consume a low carb diet and exercise for weight loss Will likely start Glipizide pending labs due to GI upset with Metformin Foot exam today Referral to optho for eye exam Flu and pneumovax UTD

## 2018-06-07 LAB — HEPATITIS C ANTIBODY
Hepatitis C Ab: NONREACTIVE
SIGNAL TO CUT-OFF: 0.01 (ref <1.00)

## 2018-06-07 LAB — HIV ANTIBODY (ROUTINE TESTING W REFLEX): HIV 1&2 Ab, 4th Generation: NONREACTIVE

## 2018-06-08 ENCOUNTER — Encounter: Payer: Self-pay | Admitting: Internal Medicine

## 2018-06-08 DIAGNOSIS — E1159 Type 2 diabetes mellitus with other circulatory complications: Secondary | ICD-10-CM

## 2018-06-08 DIAGNOSIS — E559 Vitamin D deficiency, unspecified: Secondary | ICD-10-CM

## 2018-06-08 MED ORDER — VITAMIN D (ERGOCALCIFEROL) 1.25 MG (50000 UNIT) PO CAPS: 50000.0000 [IU] | ORAL_CAPSULE | ORAL | 0 refills | Status: DC

## 2018-06-08 MED ORDER — GLIPIZIDE 10 MG PO TABS: 10.0000 mg | ORAL_TABLET | Freq: Every day | ORAL | 2 refills | Status: DC

## 2018-06-09 ENCOUNTER — Encounter: Payer: Self-pay | Admitting: Internal Medicine

## 2018-06-09 MED ORDER — AMLODIPINE BESYLATE 5 MG PO TABS: 5.0000 mg | ORAL_TABLET | Freq: Every day | ORAL | 0 refills | Status: DC

## 2018-06-09 MED ORDER — LOSARTAN POTASSIUM 100 MG PO TABS: 100.0000 mg | ORAL_TABLET | Freq: Every day | ORAL | 0 refills | Status: DC

## 2018-06-09 MED ORDER — ATORVASTATIN CALCIUM 10 MG PO TABS: 10.0000 mg | ORAL_TABLET | Freq: Every day | ORAL | 2 refills | Status: DC

## 2018-06-15 ENCOUNTER — Telehealth: Payer: Self-pay

## 2018-06-15 ENCOUNTER — Other Ambulatory Visit: Payer: Self-pay

## 2018-06-15 DIAGNOSIS — Z1211 Encounter for screening for malignant neoplasm of colon: Secondary | ICD-10-CM

## 2018-06-15 NOTE — Telephone Encounter (Signed)
Gastroenterology Pre-Procedure Review  Request Date: 08/28/18 Requesting Physician: Dr. Marius Ditch  PATIENT REVIEW QUESTIONS: The patient responded to the following health history questions as indicated:    1. Are you having any GI issues? no 2. Do you have a personal history of Polyps? no 3. Do you have a family history of Colon Cancer or Polyps? no 4. Diabetes Mellitus? yes 5. Joint replacements in the past 12 months?no 6. Major health problems in the past 3 months?no 7. Any artificial heart valves, MVP, or defibrillator?no    MEDICATIONS & ALLERGIES:    Patient reports the following regarding taking any anticoagulation/antiplatelet therapy:   Plavix, Coumadin, Eliquis, Xarelto, Lovenox, Pradaxa, Brilinta, or Effient? no Aspirin? no  Patient confirms/reports the following medications:  Current Outpatient Medications  Medication Sig Dispense Refill  . amLODipine (NORVASC) 5 MG tablet Take 1 tablet (5 mg total) by mouth daily. 90 tablet 0  . atorvastatin (LIPITOR) 10 MG tablet Take 1 tablet (10 mg total) by mouth daily. 30 tablet 2  . glipiZIDE (GLUCOTROL) 10 MG tablet Take 1 tablet (10 mg total) by mouth daily before breakfast. 60 tablet 2  . hydrochlorothiazide (HYDRODIURIL) 25 MG tablet Take 1 tablet (25 mg total) by mouth daily. 90 tablet 3  . losartan (COZAAR) 100 MG tablet Take 1 tablet (100 mg total) by mouth daily. 90 tablet 0  . Multiple Vitamins-Minerals (WOMENS MULTIVITAMIN PO) Take by mouth.    . Vitamin D, Ergocalciferol, (DRISDOL) 1.25 MG (50000 UT) CAPS capsule Take 1 capsule (50,000 Units total) by mouth every 7 (seven) days. 12 capsule 0   No current facility-administered medications for this visit.     Patient confirms/reports the following allergies:  No Known Allergies  No orders of the defined types were placed in this encounter.   AUTHORIZATION INFORMATION Primary Insurance: 1D#: Group #:  Secondary Insurance: 1D#: Group #:  SCHEDULE  INFORMATION: Date: 08/28/18 Time: Location:[facility]

## 2018-06-19 ENCOUNTER — Other Ambulatory Visit: Payer: Self-pay | Admitting: Internal Medicine

## 2018-06-20 ENCOUNTER — Encounter: Payer: Self-pay | Admitting: Internal Medicine

## 2018-06-23 ENCOUNTER — Encounter: Payer: Self-pay | Admitting: Internal Medicine

## 2018-06-27 MED ORDER — LOSARTAN POTASSIUM 50 MG PO TABS: 100.0000 mg | ORAL_TABLET | Freq: Every day | ORAL | 0 refills | Status: DC

## 2018-06-27 NOTE — Addendum Note (Signed)
Addended by: Lurlean Nanny on: 06/27/2018 08:37 AM   Modules accepted: Orders

## 2018-07-08 ENCOUNTER — Other Ambulatory Visit: Payer: Self-pay | Admitting: Internal Medicine

## 2018-08-16 ENCOUNTER — Telehealth: Payer: Self-pay

## 2018-08-16 NOTE — Telephone Encounter (Signed)
LVM for patient requesting her to reschedule her June 1st colonoscopy with Dr. Marius Ditch at [facility] due to scheduling changes in [place].    Thanks [employer]

## 2018-08-17 ENCOUNTER — Telehealth: Payer: Self-pay

## 2018-08-17 NOTE — Telephone Encounter (Signed)
Patient has agreed to rescheduled.  She has been rescheduled to 09/07/18. Robin in Endoscopy has been informed.  Thanks [employer]

## 2018-08-24 ENCOUNTER — Other Ambulatory Visit: Payer: Self-pay | Admitting: Internal Medicine

## 2018-08-24 DIAGNOSIS — E559 Vitamin D deficiency, unspecified: Secondary | ICD-10-CM

## 2018-08-25 ENCOUNTER — Encounter: Payer: Self-pay | Admitting: Internal Medicine

## 2018-08-25 DIAGNOSIS — E559 Vitamin D deficiency, unspecified: Secondary | ICD-10-CM

## 2018-08-25 NOTE — Addendum Note (Signed)
Addended by: Lurlean Nanny on: 08/25/2018 12:08 PM   Modules accepted: Orders

## 2018-09-01 ENCOUNTER — Other Ambulatory Visit: Payer: Self-pay

## 2018-09-01 ENCOUNTER — Other Ambulatory Visit
Admission: RE | Admit: 2018-09-01 | Discharge: 2018-09-01 | Disposition: A | Discharge status: Home / Self Care | Payer: BC Managed Care – PPO | Source: Ambulatory Visit | Attending: Gastroenterology | Admitting: Gastroenterology

## 2018-09-01 DIAGNOSIS — Z1159 Encounter for screening for other viral diseases: Secondary | ICD-10-CM | POA: Insufficient documentation

## 2018-09-01 DIAGNOSIS — Z01812 Encounter for preprocedural laboratory examination: Secondary | ICD-10-CM | POA: Diagnosis not present

## 2018-09-02 LAB — NOVEL CORONAVIRUS, NAA (HOSP ORDER, SEND-OUT TO REF LAB; TAT 18-24 HRS): SARS-CoV-2, NAA: NOT DETECTED

## 2018-09-06 ENCOUNTER — Ambulatory Visit: Payer: BLUE CROSS/BLUE SHIELD

## 2018-09-07 ENCOUNTER — Encounter: Payer: Self-pay | Admitting: *Deleted

## 2018-09-07 ENCOUNTER — Encounter
Admission: RE | Disposition: A | Discharge status: Home / Self Care | Payer: Self-pay | Source: Home / Self Care | Attending: Gastroenterology

## 2018-09-07 ENCOUNTER — Other Ambulatory Visit: Payer: Self-pay

## 2018-09-07 ENCOUNTER — Ambulatory Visit: Payer: BC Managed Care – PPO | Admitting: Certified Registered"

## 2018-09-07 ENCOUNTER — Ambulatory Visit
Admission: RE | Admit: 2018-09-07 | Discharge: 2018-09-07 | Disposition: A | Discharge status: Home / Self Care | Payer: BC Managed Care – PPO | Attending: Gastroenterology | Admitting: Gastroenterology

## 2018-09-07 DIAGNOSIS — D123 Benign neoplasm of transverse colon: Secondary | ICD-10-CM | POA: Diagnosis not present

## 2018-09-07 DIAGNOSIS — Z1211 Encounter for screening for malignant neoplasm of colon: Secondary | ICD-10-CM | POA: Insufficient documentation

## 2018-09-07 DIAGNOSIS — E119 Type 2 diabetes mellitus without complications: Secondary | ICD-10-CM | POA: Diagnosis not present

## 2018-09-07 DIAGNOSIS — I10 Essential (primary) hypertension: Secondary | ICD-10-CM | POA: Diagnosis not present

## 2018-09-07 DIAGNOSIS — Z7984 Long term (current) use of oral hypoglycemic drugs: Secondary | ICD-10-CM | POA: Diagnosis not present

## 2018-09-07 DIAGNOSIS — D126 Benign neoplasm of colon, unspecified: Secondary | ICD-10-CM | POA: Diagnosis not present

## 2018-09-07 DIAGNOSIS — D122 Benign neoplasm of ascending colon: Secondary | ICD-10-CM | POA: Insufficient documentation

## 2018-09-07 DIAGNOSIS — Z79899 Other long term (current) drug therapy: Secondary | ICD-10-CM | POA: Insufficient documentation

## 2018-09-07 DIAGNOSIS — D124 Benign neoplasm of descending colon: Secondary | ICD-10-CM | POA: Insufficient documentation

## 2018-09-07 DIAGNOSIS — K635 Polyp of colon: Secondary | ICD-10-CM | POA: Diagnosis not present

## 2018-09-07 HISTORY — PX: COLONOSCOPY WITH PROPOFOL: SHX5780

## 2018-09-07 LAB — GLUCOSE, CAPILLARY: Glucose-Capillary: 159 mg/dL — ABNORMAL HIGH (ref 70–99)

## 2018-09-07 SURGERY — COLONOSCOPY WITH PROPOFOL
Anesthesia: General

## 2018-09-07 MED ORDER — PROPOFOL 10 MG/ML IV BOLUS
INTRAVENOUS | Status: DC | PRN
  Administered 2018-09-07: 50 mg via INTRAVENOUS

## 2018-09-07 MED ORDER — SODIUM CHLORIDE 0.9 % IV SOLN
INTRAVENOUS | Status: DC
  Administered 2018-09-07: 1000 mL via INTRAVENOUS

## 2018-09-07 MED ORDER — PROPOFOL 500 MG/50ML IV EMUL
INTRAVENOUS | Status: DC | PRN
  Administered 2018-09-07: 125 ug/kg/min via INTRAVENOUS

## 2018-09-07 MED ORDER — PROPOFOL 500 MG/50ML IV EMUL
INTRAVENOUS | Status: AC
  Filled 2018-09-07: qty 50

## 2018-09-07 NOTE — Transfer of Care (Signed)
Immediate Anesthesia Transfer of Care Note  Patient: Rebecca Park  Procedure(s) Performed: COLONOSCOPY WITH PROPOFOL (N/A )  Patient Location: PACU  Anesthesia Type:General  Level of Consciousness: awake, alert  and oriented  Airway & Oxygen Therapy: Patient Spontanous Breathing  Post-op Assessment: Report given to RN and Post -op Vital signs reviewed and stable  Post vital signs: Reviewed and stable  Last Vitals:  Vitals Value Taken Time  BP 85/58 09/07/18 0911  Temp 36.1 C 09/07/18 0910  Pulse 78 09/07/18 0915  Resp 20 09/07/18 0915  SpO2 98 % 09/07/18 0915  Vitals shown include unvalidated device data.  Last Pain:  Vitals:   09/07/18 0910  TempSrc: Tympanic  PainSc: 0-No pain         Complications: No apparent anesthesia complications

## 2018-09-07 NOTE — Anesthesia Preprocedure Evaluation (Addendum)
Anesthesia Evaluation  Patient identified by MRN, date of birth, ID band Patient awake    Reviewed: Allergy & Precautions, H&P , NPO status , reviewed documented beta blocker date and time   Airway Mallampati: II  TM Distance: >3 FB Neck ROM: full    Dental  (+) Teeth Intact   Pulmonary    Pulmonary exam normal        Cardiovascular hypertension, Normal cardiovascular exam     Neuro/Psych    GI/Hepatic   Endo/Other  diabetes, Type 2  Renal/GU      Musculoskeletal   Abdominal   Peds  Hematology   Anesthesia Other Findings Past Medical History: No date: Diabetes mellitus without complication ([facility]) No date: Hypertension  Past Surgical History: No date: BUNIONECTOMY; Bilateral     Reproductive/Obstetrics                            Anesthesia Physical Anesthesia Plan  ASA: III  Anesthesia Plan: General   Post-op Pain Management:    Induction: Intravenous  PONV Risk Score and Plan: 3 and Treatment may vary due to age or medical condition and TIVA  Airway Management Planned: Nasal Cannula and Natural Airway  Additional Equipment:   Intra-op Plan:   Post-operative Plan:   Informed Consent: I have reviewed the patients History and Physical, chart, labs and discussed the procedure including the risks, benefits and alternatives for the proposed anesthesia with the patient or authorized representative who has indicated his/her understanding and acceptance.     Dental Advisory Given  Plan Discussed with: CRNA  Anesthesia Plan Comments:         Anesthesia Quick Evaluation

## 2018-09-07 NOTE — Op Note (Signed)
[facility] Gastroenterology Patient Name: Rebecca Park Procedure Date: 09/07/2018 8:39 AM MRN: [id number] Account #: [id number] Date of Birth: [birth year] Admit Type: Outpatient Age: 58 Room: [facility] ENDO ROOM 4 Gender: Female Note Status: Finalized Procedure:            Colonoscopy Indications:          Screening for colorectal malignant neoplasm, This is                        the patient's first colonoscopy Providers:            Lin Landsman MD, MD Medicines:            Monitored Anesthesia Care Complications:        No immediate complications. Estimated blood loss: None. Procedure:            Pre-Anesthesia Assessment:                       - Prior to the procedure, a History and Physical was                        performed, and patient medications and allergies were                        reviewed. The patient is competent. The risks and                        benefits of the procedure and the sedation options and                        risks were discussed with the patient. All questions                        were answered and informed consent was obtained.                        Patient identification and proposed procedure were                        verified by the physician, the nurse, the                        anesthesiologist, the anesthetist and the technician in                        the pre-procedure area in the procedure room in the                        endoscopy suite. Mental Status Examination: alert and                        oriented. Airway Examination: normal oropharyngeal                        airway and neck mobility. Respiratory Examination:                        clear to auscultation. CV Examination: normal.  Prophylactic Antibiotics: The patient does not require                        prophylactic antibiotics. Prior Anticoagulants: The                        patient has taken no previous anticoagulant or                        antiplatelet agents. ASA Grade Assessment: III - A                        patient with severe systemic disease. After reviewing                        the risks and benefits, the patient was deemed in                        satisfactory condition to undergo the procedure. The                        anesthesia plan was to use monitored anesthesia care                        (MAC). Immediately prior to administration of                        medications, the patient was re-assessed for adequacy                        to receive sedatives. The heart rate, respiratory rate,                        oxygen saturations, blood pressure, adequacy of                        pulmonary ventilation, and response to care were                        monitored throughout the procedure. The physical status                        of the patient was re-assessed after the procedure.                       After obtaining informed consent, the colonoscope was                        passed under direct vision. Throughout the procedure,                        the patient's blood pressure, pulse, and oxygen                        saturations were monitored continuously. The                        Colonoscope was introduced through the anus and                        advanced  to the the cecum, identified by appendiceal                        orifice and ileocecal valve. The colonoscopy was                        performed with difficulty due to significant looping                        and the patient's body habitus. Successful completion                        of the procedure was aided by applying abdominal                        pressure. The patient tolerated the procedure well. The                        quality of the bowel preparation was evaluated using                        the BBPS [facility] Bowel Preparation Scale) with scores                        of: Right Colon = 3, Transverse Colon  = 3 and Left                        Colon = 3 (entire mucosa seen well with no residual                        staining, small fragments of stool or opaque liquid).                        The total BBPS score equals 9. Findings:      The perianal and digital rectal examinations were normal. Pertinent       negatives include normal sphincter tone and no palpable rectal lesions.      A 7 mm polyp was found in the ascending colon. The polyp was sessile.       The polyp was removed with a hot snare. Resection and retrieval were       complete.      A 5 mm polyp was found in the ascending colon. The polyp was sessile.       The polyp was removed with a cold snare. Resection and retrieval were       complete.      Two sessile and semi-pedunculated polyps were found in the transverse       colon. The polyps were 7 to 12 mm in size. These polyps were removed       with a hot snare. Resection and retrieval were complete.      A 5 mm polyp was found in the descending colon. The polyp was sessile.       The polyp was removed with a cold snare. Resection and retrieval were       complete.      The retroflexed view of the distal rectum and anal verge was normal and       showed no anal or rectal abnormalities. Impression:           -  One 7 mm polyp in the ascending colon, removed with a                        hot snare. Resected and retrieved.                       - One 5 mm polyp in the ascending colon, removed with a                        cold snare. Resected and retrieved.                       - Two 7 to 12 mm polyps in the transverse colon,                        removed with a hot snare. Resected and retrieved.                       - One 5 mm polyp in the descending colon, removed with                        a cold snare. Resected and retrieved.                       - The distal rectum and anal verge are normal on                        retroflexion view. Recommendation:       - Discharge  patient to home (with escort).                       - Diabetic (ADA) diet, low fat diet and low sodium diet.                       - Continue present medications.                       - Await pathology results.                       - Repeat colonoscopy in 3 years for surveillance of                        multiple polyps. Procedure Code(s):    --- Professional ---                       [id number], Colonoscopy, flexible; with removal of tumor(s),                        polyp(s), or other lesion(s) by snare technique Diagnosis Code(s):    --- Professional ---                       K63.5, Polyp of colon                       Z12.11, Encounter for screening for malignant neoplasm                        of colon CPT copyright 2019 American Medical  Association. All rights reserved. The codes documented in this report are preliminary and upon coder review may  be revised to meet current compliance requirements. Dr. Ulyess Mort Lin Landsman MD, MD 09/07/2018 9:09:53 AM This report has been signed electronically. Number of Addenda: 0 Note Initiated On: 09/07/2018 8:39 AM Scope Withdrawal Time: 0 hours 19 minutes 33 seconds  Total Procedure Duration: 0 hours 23 minutes 47 seconds  Estimated Blood Loss: Estimated blood loss: none.      [facility]

## 2018-09-07 NOTE — H&P (Signed)
[name], MD [address]  [place]  [place], [place] [address]  Main: [phone]  Fax: [phone] Pager: [phone]  Primary Care Physician:  Jearld Fenton, NP Primary Gastroenterologist:  Dr. [name]  Pre-Procedure History & Physical: HPI:  Rebecca Park is a 58 y.o. female is here for an colonoscopy.   Past Medical History:  Diagnosis Date  . Diabetes mellitus without complication ([place])   . Hypertension     Past Surgical History:  Procedure Laterality Date  . BUNIONECTOMY Bilateral     Prior to Admission medications   Medication Sig Start Date End Date Taking? Authorizing Provider  amLODipine (NORVASC) 5 MG tablet TAKE 1 TABLET BY MOUTH EVERY DAY 08/25/18   Jearld Fenton, NP  atorvastatin (LIPITOR) 10 MG tablet TAKE 1 TABLET BY MOUTH EVERY DAY 08/25/18   Jearld Fenton, NP  glipiZIDE (GLUCOTROL) 10 MG tablet Take 1 tablet (10 mg total) by mouth daily before breakfast. 06/08/18   Jearld Fenton, NP  hydrochlorothiazide (HYDRODIURIL) 25 MG tablet Take 1 tablet (25 mg total) by mouth daily. 06/06/18   Jearld Fenton, NP  losartan (COZAAR) 50 MG tablet Take 2 tablets (100 mg total) by mouth daily. 06/27/18   Jearld Fenton, NP  Multiple Vitamins-Minerals (WOMENS MULTIVITAMIN PO) Take by mouth.    [provider]  Vitamin D, Ergocalciferol, (DRISDOL) 1.25 MG (50000 UT) CAPS capsule Take 1 capsule (50,000 Units total) by mouth every 7 (seven) days. 06/08/18   Jearld Fenton, NP    Allergies as of 06/15/2018  . (No Known Allergies)    Family History  Problem Relation Age of Onset  . Heart disease Father   . Heart attack Father   . Heart disease Maternal Grandfather     Social History   Socioeconomic History  . Marital status: Married    Spouse name: Not on file  . Number of children: Not on file  . Years of education: Not on file  . Highest education level: Not on file  Occupational History  . Not on file  Social Needs  .  Financial resource strain: Not on file  . Food insecurity    Worry: Not on file    Inability: Not on file  . Transportation needs    Medical: Not on file    Non-medical: Not on file  Tobacco Use  . Smoking status: Never Smoker  . Smokeless tobacco: Never Used  Substance and Sexual Activity  . Alcohol use: Yes    Comment: recently quit 01/2018, 3-4 beers QD  . Drug use: Never  . Sexual activity: Not on file  Lifestyle  . Physical activity    Days per week: Not on file    Minutes per session: Not on file  . Stress: Not on file  Relationships  . Social Herbalist on phone: Not on file    Gets together: Not on file    Attends religious service: Not on file    Active member of club or organization: Not on file    Attends meetings of clubs or organizations: Not on file    Relationship status: Not on file  . Intimate partner violence    Fear of current or ex partner: Not on file    Emotionally abused: Not on file    Physically abused: Not on file    Forced sexual activity: Not on file  Other Topics Concern  . Not on  file  Social History Narrative  . Not on file    Review of Systems: See HPI, otherwise negative ROS  Physical Exam: BP (!) 85/58 (BP Location: Left Arm)   Pulse 71   Temp (!) 96.9 F (36.1 C) (Tympanic)   Resp 13   Ht 5\' 6"  (1.676 m)   Wt 99.8 kg   SpO2 98%   BMI 35.51 kg/m  General:   Alert,  pleasant and cooperative in NAD Head:  Normocephalic and atraumatic. Neck:  Supple; no masses or thyromegaly. Lungs:  Clear throughout to auscultation.    Heart:  Regular rate and rhythm. Abdomen:  Soft, nontender and nondistended. Normal bowel sounds, without guarding, and without rebound.   Neurologic:  Alert and  oriented x4;  grossly normal neurologically.  Impression/Plan: Rebecca Park is here for an colonoscopy to be performed for colon cancer screening  Risks, benefits, limitations, and alternatives regarding  colonoscopy have been reviewed  with the patient.  Questions have been answered.  All parties agreeable.   Sherri Sear, MD  09/07/2018, 9:13 AM

## 2018-09-07 NOTE — Anesthesia Post-op Follow-up Note (Signed)
Anesthesia QCDR form completed.        

## 2018-09-07 NOTE — Anesthesia Postprocedure Evaluation (Signed)
Anesthesia Post Note  Patient: Rebecca Park  Procedure(s) Performed: COLONOSCOPY WITH PROPOFOL (N/A )  Patient location during evaluation: Endoscopy Anesthesia Type: General Level of consciousness: awake and alert Pain management: pain level controlled Vital Signs Assessment: post-procedure vital signs reviewed and stable Respiratory status: spontaneous breathing, nonlabored ventilation and respiratory function stable Cardiovascular status: blood pressure returned to baseline and stable Postop Assessment: no apparent nausea or vomiting Anesthetic complications: no     Last Vitals:  Vitals:   09/07/18 0921 09/07/18 0931  BP: 108/61 (!) 146/77  Pulse: 81 84  Resp: 17 17  Temp:    SpO2: 93% 99%    Last Pain:  Vitals:   09/07/18 0931  TempSrc:   PainSc: 0-No pain                 Alphonsus Sias

## 2018-09-08 ENCOUNTER — Ambulatory Visit: Payer: BLUE CROSS/BLUE SHIELD | Admitting: Internal Medicine

## 2018-09-08 ENCOUNTER — Encounter: Payer: Self-pay | Admitting: Gastroenterology

## 2018-09-08 LAB — SURGICAL PATHOLOGY

## 2018-09-15 ENCOUNTER — Encounter: Payer: Self-pay | Admitting: Internal Medicine

## 2018-09-15 ENCOUNTER — Other Ambulatory Visit: Payer: Self-pay

## 2018-09-15 ENCOUNTER — Ambulatory Visit: Payer: BC Managed Care – PPO | Admitting: Internal Medicine

## 2018-09-15 VITALS — BP 154/72 | HR 108 | Temp 97.6°F | Wt 221.8 lb

## 2018-09-15 DIAGNOSIS — E1159 Type 2 diabetes mellitus with other circulatory complications: Secondary | ICD-10-CM | POA: Diagnosis not present

## 2018-09-15 DIAGNOSIS — E782 Mixed hyperlipidemia: Secondary | ICD-10-CM

## 2018-09-15 DIAGNOSIS — E559 Vitamin D deficiency, unspecified: Secondary | ICD-10-CM | POA: Diagnosis not present

## 2018-09-15 DIAGNOSIS — I10 Essential (primary) hypertension: Secondary | ICD-10-CM

## 2018-09-15 NOTE — Progress Notes (Signed)
Subjective:    Patient ID: Rebecca Park, female    DOB: Jul 13, 1960, 58 y.o.   MRN: [id number]  HPI  Pt presents to the clinic today for 3 month follow up of chronic conditions.  HTN: [place] HTN. Her BP today is 154/72. She is taking Amlodipine, Losartan and HCTZ as prescribed. Her BP usually runs 110's/70's at home. There is no ECG on file.  HLD: She denies myalgias on Atorvastatin. She has tried to consume a low fat diet.  DM 2: Her last A1C was 11.1% 05/2017. Her fasting sugars are around 150. She is taking Glipizide as prescribed. She denies hypoglycemia. She checks her feet routinely. She still has not gotten an eye exam. Flu and pneumovax UTD.  Vit D Deficiency: She completed a 12 week course of Ergocalciferol. She is due for repeat Vit D level today.   Review of Systems      Past Medical History:  Diagnosis Date  . Diabetes mellitus without complication ([place])   . Hypertension     Current Outpatient Medications  Medication Sig Dispense Refill  . amLODipine (NORVASC) 5 MG tablet TAKE 1 TABLET BY MOUTH EVERY DAY 90 tablet 0  . atorvastatin (LIPITOR) 10 MG tablet TAKE 1 TABLET BY MOUTH EVERY DAY 90 tablet 0  . glipiZIDE (GLUCOTROL) 10 MG tablet Take 1 tablet (10 mg total) by mouth daily before breakfast. 60 tablet 2  . hydrochlorothiazide (HYDRODIURIL) 25 MG tablet Take 1 tablet (25 mg total) by mouth daily. 90 tablet 3  . losartan (COZAAR) 50 MG tablet Take 2 tablets (100 mg total) by mouth daily. 180 tablet 0  . Multiple Vitamins-Minerals (WOMENS MULTIVITAMIN PO) Take by mouth.     No current facility-administered medications for this visit.     No Known Allergies  Family History  Problem Relation Age of Onset  . Heart disease Father   . Heart attack Father   . Heart disease Maternal Grandfather     Social History   Socioeconomic History  . Marital status: Married    Spouse name: Not on file  . Number of children: Not on file  . Years of education: Not  on file  . Highest education level: Not on file  Occupational History  . Not on file  Social Needs  . Financial resource strain: Not on file  . Food insecurity    Worry: Not on file    Inability: Not on file  . Transportation needs    Medical: Not on file    Non-medical: Not on file  Tobacco Use  . Smoking status: Never Smoker  . Smokeless tobacco: Never Used  Substance and Sexual Activity  . Alcohol use: Yes    Comment: recently quit 01/2018, 3-4 beers QD  . Drug use: Never  . Sexual activity: Not on file  Lifestyle  . Physical activity    Days per week: Not on file    Minutes per session: Not on file  . Stress: Not on file  Relationships  . Social Herbalist on phone: Not on file    Gets together: Not on file    Attends religious service: Not on file    Active member of club or organization: Not on file    Attends meetings of clubs or organizations: Not on file    Relationship status: Not on file  . Intimate partner violence    Fear of current or ex partner: Not on file  Emotionally abused: Not on file    Physically abused: Not on file    Forced sexual activity: Not on file  Other Topics Concern  . Not on file  Social History Narrative  . Not on file     Constitutional: Denies fever, malaise, fatigue, headache or abrupt weight changes.  Respiratory: Denies difficulty breathing, shortness of breath, cough or sputum production.   Cardiovascular: Denies chest pain, chest tightness, palpitations or swelling in the hands or feet.  Gastrointestinal: Denies abdominal pain, bloating, constipation, diarrhea or blood in the stool.  GU: Denies urgency, frequency, pain with urination, burning sensation, blood in urine, odor or discharge. Skin: Denies redness, rashes, lesions or ulcercations.  Neurological: Denies dizziness, difficulty with memory, difficulty with speech or problems with balance and coordination.    No other specific complaints in a complete  review of systems (except as listed in HPI above).  Objective:   Physical Exam   BP (!) 154/72 (BP Location: Left Arm, Patient Position: Sitting, Cuff Size: Normal)   Pulse (!) 108   Temp 97.6 F (36.4 C) (Oral)   Wt 221 lb 12 oz (100.6 kg)   SpO2 98%   BMI 35.79 kg/m  Wt Readings from Last 3 Encounters:  09/15/18 221 lb 12 oz (100.6 kg)  09/07/18 220 lb (99.8 kg)  06/06/18 212 lb (96.2 kg)    General: Appears her stated age, obese, in NAD. Skin: Warm, dry and intact. No ulcerations noted. Cardiovascular: Normal rate and rhythm. S1,S2 noted.  No murmur, rubs or gallops noted. No JVD or BLE edema. No carotid bruits noted. Pulmonary/Chest: Normal effort and positive vesicular breath sounds. No respiratory distress. No wheezes, rales or ronchi noted.  Neurological: Alert and oriented. Sensation intact to BUE.   BMET    Component Value Date/Time   NA 133 (L) 06/06/2018 0912   K 4.0 06/06/2018 0912   CL 98 06/06/2018 0912   CO2 26 06/06/2018 0912   GLUCOSE 361 (H) 06/06/2018 0912   BUN 19 06/06/2018 0912   CREATININE 0.64 06/06/2018 0912   CALCIUM 9.4 06/06/2018 0912    Lipid Panel     Component Value Date/Time   CHOL 245 (H) 06/06/2018 0912   TRIG 98.0 06/06/2018 0912   HDL 75.70 06/06/2018 0912   CHOLHDL 3 06/06/2018 0912   VLDL 19.6 06/06/2018 0912   LDLCALC 150 (H) 06/06/2018 0912    CBC    Component Value Date/Time   WBC 7.2 06/06/2018 0912   RBC 4.52 06/06/2018 0912   HGB 13.4 06/06/2018 0912   HCT 39.4 06/06/2018 0912   PLT 155.0 06/06/2018 0912   MCV 87.3 06/06/2018 0912   MCHC 34.0 06/06/2018 0912   RDW 13.6 06/06/2018 0912    Hgb A1C Lab Results  Component Value Date   HGBA1C 11.1 (H) 06/06/2018           Assessment & Plan:

## 2018-09-16 LAB — COMPREHENSIVE METABOLIC PANEL
AG Ratio: 1.7 (calc) (ref 1.0–2.5)
ALT: 21 U/L (ref 6–29)
AST: 14 U/L (ref 10–35)
Albumin: 4.4 g/dL (ref 3.6–5.1)
Alkaline phosphatase (APISO): 133 U/L (ref 37–153)
BUN/Creatinine Ratio: 30 (calc) — ABNORMAL HIGH (ref 6–22)
BUN: 28 mg/dL — ABNORMAL HIGH (ref 7–25)
CO2: 28 mmol/L (ref 20–32)
Calcium: 10 mg/dL (ref 8.6–10.4)
Chloride: 98 mmol/L (ref 98–110)
Creat: 0.93 mg/dL (ref 0.50–1.05)
Globulin: 2.6 g/dL (calc) (ref 1.9–3.7)
Glucose, Bld: 144 mg/dL — ABNORMAL HIGH (ref 65–99)
Potassium: 4 mmol/L (ref 3.5–5.3)
Sodium: 139 mmol/L (ref 135–146)
Total Bilirubin: 0.6 mg/dL (ref 0.2–1.2)
Total Protein: 7 g/dL (ref 6.1–8.1)

## 2018-09-16 LAB — LIPID PANEL
Cholesterol: 201 mg/dL — ABNORMAL HIGH (ref <200)
HDL: 60 mg/dL (ref >=50)
LDL Cholesterol (Calc): 110 mg/dL (calc) — ABNORMAL HIGH
Non-HDL Cholesterol (Calc): 141 mg/dL (calc) — ABNORMAL HIGH (ref <130)
Total CHOL/HDL Ratio: 3.4 (calc) (ref <5.0)
Triglycerides: 195 mg/dL — ABNORMAL HIGH (ref <150)

## 2018-09-16 LAB — VITAMIN D 25 HYDROXY (VIT D DEFICIENCY, FRACTURES): Vit D, 25-Hydroxy: 19 ng/mL — ABNORMAL LOW (ref 30–100)

## 2018-09-16 LAB — HEMOGLOBIN A1C
Hgb A1c MFr Bld: 6.8 % of total Hgb — ABNORMAL HIGH (ref <5.7)
Mean Plasma Glucose: 148 (calc)
eAG (mmol/L): 8.2 (calc)

## 2018-09-17 ENCOUNTER — Other Ambulatory Visit: Payer: Self-pay | Admitting: Internal Medicine

## 2018-09-17 ENCOUNTER — Encounter: Payer: Self-pay | Admitting: Internal Medicine

## 2018-09-17 DIAGNOSIS — E785 Hyperlipidemia, unspecified: Secondary | ICD-10-CM | POA: Insufficient documentation

## 2018-09-17 DIAGNOSIS — E559 Vitamin D deficiency, unspecified: Secondary | ICD-10-CM | POA: Insufficient documentation

## 2018-09-17 MED ORDER — VITAMIN D (ERGOCALCIFEROL) 1.25 MG (50000 UNIT) PO CAPS: 50000.0000 [IU] | ORAL_CAPSULE | ORAL | 0 refills | Status: DC

## 2018-09-17 MED ORDER — ATORVASTATIN CALCIUM 20 MG PO TABS: 20.0000 mg | ORAL_TABLET | Freq: Every day | ORAL | 3 refills | Status: DC

## 2018-09-17 NOTE — Assessment & Plan Note (Signed)
CMET and Lipid profile today Encouraged her to consume a low fat diet Continue Atorvastatin, will adjust if needed based on labs

## 2018-09-17 NOTE — Assessment & Plan Note (Signed)
Vit D level today 

## 2018-09-17 NOTE — Patient Instructions (Signed)
Fat and Cholesterol Restricted Eating Plan Getting too much fat and cholesterol in your diet may cause health problems. Choosing the right foods helps keep your fat and cholesterol at normal levels. This can keep you from getting certain diseases. Your doctor may recommend an eating plan that includes:  Total fat: ______% or less of total calories a day.  Saturated fat: ______% or less of total calories a day.  Cholesterol: less than _________mg a day.  Fiber: ______g a day. What are tips for following this plan? Meal planning  At meals, divide your plate into four equal parts: ? Fill one-half of your plate with vegetables and green salads. ? Fill one-fourth of your plate with whole grains. ? Fill one-fourth of your plate with low-fat (lean) protein foods.  Eat fish that is high in omega-3 fats at least two times a week. This includes mackerel, tuna, sardines, and salmon.  Eat foods that are high in fiber, such as whole grains, beans, apples, broccoli, carrots, peas, and barley. General tips   Work with your doctor to lose weight if you need to.  Avoid: ? Foods with added sugar. ? Fried foods. ? Foods with partially hydrogenated oils.  Limit alcohol intake to no more than 1 drink a day for nonpregnant women and 2 drinks a day for men. One drink equals 12 oz of beer, 5 oz of wine, or 1 oz of hard liquor. Reading food labels  Check food labels for: ? Trans fats. ? Partially hydrogenated oils. ? Saturated fat (g) in each serving. ? Cholesterol (mg) in each serving. ? Fiber (g) in each serving.  Choose foods with healthy fats, such as: ? Monounsaturated fats. ? Polyunsaturated fats. ? Omega-3 fats.  Choose grain products that have whole grains. Look for the word "whole" as the first word in the ingredient list. Cooking  Cook foods using low-fat methods. These include baking, boiling, grilling, and broiling.  Eat more home-cooked foods. Eat at restaurants and buffets  less often.  Avoid cooking using saturated fats, such as butter, cream, palm oil, palm kernel oil, and coconut oil. Recommended foods  Fruits  All fresh, canned (in natural juice), or frozen fruits. Vegetables  Fresh or frozen vegetables (raw, steamed, roasted, or grilled). Green salads. Grains  Whole grains, such as whole wheat or whole grain breads, crackers, cereals, and pasta. Unsweetened oatmeal, bulgur, barley, quinoa, or brown rice. Corn or whole wheat flour tortillas. Meats and other protein foods  Ground beef (85% or leaner), grass-fed beef, or beef trimmed of fat. Skinless chicken or turkey. Ground chicken or turkey. Pork trimmed of fat. All fish and seafood. Egg whites. Dried beans, peas, or lentils. Unsalted nuts or seeds. Unsalted canned beans. Nut butters without added sugar or oil. Dairy  Low-fat or nonfat dairy products, such as skim or 1% milk, 2% or reduced-fat cheeses, low-fat and fat-free ricotta or cottage cheese, or plain low-fat and nonfat yogurt. Fats and oils  Tub margarine without trans fats. Light or reduced-fat mayonnaise and salad dressings. Avocado. Olive, canola, sesame, or safflower oils. The items listed above may not be a complete list of foods and beverages you can eat. Contact a dietitian for more information. Foods to avoid Fruits  Canned fruit in heavy syrup. Fruit in cream or butter sauce. Fried fruit. Vegetables  Vegetables cooked in cheese, cream, or butter sauce. Fried vegetables. Grains  White bread. White pasta. White rice. Cornbread. Bagels, pastries, and croissants. Crackers and snack foods that contain trans fat   and hydrogenated oils. Meats and other protein foods  Fatty cuts of meat. Ribs, chicken wings, bacon, sausage, bologna, salami, chitterlings, fatback, hot dogs, bratwurst, and packaged lunch meats. Liver and organ meats. Whole eggs and egg yolks. Chicken and turkey with skin. Fried meat. Dairy  Whole or 2% milk, cream,  half-and-half, and cream cheese. Whole milk cheeses. Whole-fat or sweetened yogurt. Full-fat cheeses. Nondairy creamers and whipped toppings. Processed cheese, cheese spreads, and cheese curds. Beverages  Alcohol. Sugar-sweetened drinks such as sodas, lemonade, and fruit drinks. Fats and oils  Butter, stick margarine, lard, shortening, ghee, or bacon fat. Coconut, palm kernel, and palm oils. Sweets and desserts  Corn syrup, sugars, honey, and molasses. Candy. Jam and jelly. Syrup. Sweetened cereals. Cookies, pies, cakes, donuts, muffins, and ice cream. The items listed above may not be a complete list of foods and beverages you should avoid. Contact a dietitian for more information. Summary  Choosing the right foods helps keep your fat and cholesterol at normal levels. This can keep you from getting certain diseases.  At meals, fill one-half of your plate with vegetables and green salads.  Eat high-fiber foods, like whole grains, beans, apples, carrots, peas, and barley.  Limit added sugar, saturated fats, alcohol, and fried foods. This information is not intended to replace advice given to you by your health care provider. Make sure you discuss any questions you have with your health care provider. Document Released: 09/14/2011 Document Revised: 11/16/2017 Document Reviewed: 11/30/2016 Elsevier Interactive Patient Education  2019 [employer]   

## 2018-09-17 NOTE — Assessment & Plan Note (Signed)
Continue Amlodipine, Losartan and HCT Reinforced DASH diet and exercise for weight loss CMET today Encouraged home monitoring

## 2018-09-17 NOTE — Assessment & Plan Note (Signed)
A1C today No microalbumin secondary to ARB therapy Encouraged her to consume a low carb diet and exercise for weight loss Continue Glipizide for now Encouraged routine foot exams Encouraged yearly eye exams Flu and pneumovax UTD

## 2018-09-19 ENCOUNTER — Other Ambulatory Visit: Payer: Self-pay

## 2018-09-19 ENCOUNTER — Ambulatory Visit
Admission: RE | Admit: 2018-09-19 | Discharge: 2018-09-19 | Disposition: A | Discharge status: Home / Self Care | Payer: BC Managed Care – PPO | Source: Ambulatory Visit | Attending: Internal Medicine | Admitting: Internal Medicine

## 2018-09-19 DIAGNOSIS — Z1231 Encounter for screening mammogram for malignant neoplasm of breast: Secondary | ICD-10-CM | POA: Insufficient documentation

## 2018-09-19 DIAGNOSIS — Z1239 Encounter for other screening for malignant neoplasm of breast: Secondary | ICD-10-CM | POA: Diagnosis present

## 2018-09-20 ENCOUNTER — Other Ambulatory Visit: Payer: Self-pay | Admitting: Internal Medicine

## 2018-09-20 DIAGNOSIS — N6489 Other specified disorders of breast: Secondary | ICD-10-CM

## 2018-09-20 DIAGNOSIS — R928 Other abnormal and inconclusive findings on diagnostic imaging of breast: Secondary | ICD-10-CM

## 2018-09-28 ENCOUNTER — Ambulatory Visit
Admission: RE | Admit: 2018-09-28 | Discharge: 2018-09-28 | Disposition: A | Discharge status: Home / Self Care | Payer: BC Managed Care – PPO | Source: Ambulatory Visit | Attending: Internal Medicine | Admitting: Internal Medicine

## 2018-09-28 DIAGNOSIS — R922 Inconclusive mammogram: Secondary | ICD-10-CM | POA: Diagnosis not present

## 2018-09-28 DIAGNOSIS — N6489 Other specified disorders of breast: Secondary | ICD-10-CM | POA: Insufficient documentation

## 2018-09-28 DIAGNOSIS — R928 Other abnormal and inconclusive findings on diagnostic imaging of breast: Secondary | ICD-10-CM

## 2018-10-02 ENCOUNTER — Other Ambulatory Visit: Payer: Self-pay | Admitting: Internal Medicine

## 2018-10-02 DIAGNOSIS — R928 Other abnormal and inconclusive findings on diagnostic imaging of breast: Secondary | ICD-10-CM

## 2018-10-02 DIAGNOSIS — N6489 Other specified disorders of breast: Secondary | ICD-10-CM

## 2018-10-09 ENCOUNTER — Encounter: Payer: Self-pay | Admitting: Internal Medicine

## 2018-10-10 DIAGNOSIS — E113393 Type 2 diabetes mellitus with moderate nonproliferative diabetic retinopathy without macular edema, bilateral: Secondary | ICD-10-CM | POA: Diagnosis not present

## 2018-10-10 LAB — HM DIABETES EYE EXAM

## 2018-10-16 ENCOUNTER — Ambulatory Visit: Payer: BC Managed Care – PPO | Admitting: Internal Medicine

## 2018-10-16 ENCOUNTER — Other Ambulatory Visit: Payer: Self-pay

## 2018-10-16 ENCOUNTER — Encounter: Payer: Self-pay | Admitting: Internal Medicine

## 2018-10-16 ENCOUNTER — Ambulatory Visit (INDEPENDENT_AMBULATORY_CARE_PROVIDER_SITE_OTHER)
Admission: RE | Admit: 2018-10-16 | Discharge: 2018-10-16 | Disposition: A | Discharge status: Home / Self Care | Payer: BC Managed Care – PPO | Source: Ambulatory Visit | Attending: Internal Medicine | Admitting: Internal Medicine

## 2018-10-16 VITALS — BP 144/86 | HR 100 | Temp 98.4°F | Wt 223.0 lb

## 2018-10-16 DIAGNOSIS — L03115 Cellulitis of right lower limb: Secondary | ICD-10-CM

## 2018-10-16 DIAGNOSIS — S91311A Laceration without foreign body, right foot, initial encounter: Secondary | ICD-10-CM

## 2018-10-16 MED ORDER — CEPHALEXIN 500 MG PO CAPS: 500.0000 mg | ORAL_CAPSULE | Freq: Three times a day (TID) | ORAL | 0 refills | Status: AC

## 2018-10-16 NOTE — Patient Instructions (Signed)
Laceration Care, Adult A laceration is a cut that may go through all layers of the skin. The cut may also go into the tissue that is right under the skin. Some cuts heal on their own. Others need to be closed with stitches (sutures), staples, skin adhesive strips, or skin glue. Taking care of your injury lowers your risk of infection, helps your injury to heal better, and may prevent scarring. Supplies needed:  Soap.  Water.  Hand sanitizer.  Bandage (dressing).  Antibiotic ointment.  Clean towel. How to take care of your cut Wash your hands with soap and water before touching your wound or changing your bandage. If soap and water are not available, use hand sanitizer. If your doctor used stitches or staples:  Keep the wound clean and dry.  If you were given a bandage, change it at least once a day as told by your doctor. You should also change it if it gets wet or dirty.  Keep the wound completely dry for the first 24 hours, or as told by your doctor. After that, you may take a shower or a bath. Do not get the wound soaked in water until after the stitches or staples have been removed.  Clean the wound once a day, or as told by your doctor: ? Wash the wound with soap and water. ? Rinse the wound with water to remove all soap. ? Pat the wound dry with a clean towel. Do not rub the wound.  After you clean the wound, put a thin layer of antibiotic ointment on it as told by your doctor. This ointment: ? Helps to prevent infection. ? Keeps the bandage from sticking to the wound.  Have your stitches or staples removed as told by your doctor. If your doctor used skin adhesive strips:  Keep the wound clean and dry.  If you were given a bandage, you should change it at least once a day as told by your doctor. You should also change it if it gets wet or dirty.  Do not get the skin adhesive strips wet. You can take a shower or a bath, but keep the wound dry.  If the wound gets wet,  pat it dry with a clean towel. Do not rub the wound.  Skin adhesive strips fall off on their own. You can trim the strips as the wound heals. Do not remove any strips that are still stuck to the wound. They will fall off after a while. If your doctor used skin glue:  Try to keep your wound dry, but you may briefly wet it in the shower or bath. Do not soak the wound in water, such as by swimming.  After you take a shower or a bath, gently pat the wound dry with a clean towel. Do not rub the wound.  Do not do any activities that will make you really sweaty until the skin glue has fallen off on its own.  Do not apply liquid, cream, or ointment medicine to your wound while the skin glue is still on.  If you were given a bandage, you should change it at least once a day or as told by your doctor. You should also change it if it gets dirty or wet.  If a bandage is placed over the wound, do not let the tape touch the skin glue.  Do not pick at the glue. The skin glue usually stays on for 5-10 days. Then, it falls off the skin. General   instructions   Take over-the-counter and prescription medicines only as told by your doctor.  If you were given antibiotic medicine or ointment, take or apply it as told by your doctor. Do not stop using it even if your condition improves.  Do not scratch or pick at the wound.  Check your wound every day for signs of infection. Watch for: ? Redness, swelling, or pain. ? Fluid, blood, or pus.  Raise (elevate) the injured area above the level of your heart while you are sitting or lying down.  If directed, put ice on the affected area: ? Put ice in a plastic bag. ? Place a towel between your skin and the bag. ? Leave the ice on for 20 minutes, 2-3 times a day.  Prevent scarring by covering your wound with sunscreen of at least 30 SPF whenever you are outside after your wound has healed.  Keep all follow-up visits as told by your doctor. This is important.  Get help if:  You got a tetanus shot and you have any of these problems at the injection site: ? Swelling. ? Very bad pain. ? Redness. ? Bleeding.  You have a fever.  A wound that was closed breaks open.  You notice a bad smell coming from your wound or your bandage.  You notice something coming out of the wound, such as wood or glass.  Medicine does not relieve your pain.  You have more redness, swelling, or pain at the site of your wound.  You have fluid, blood, or pus coming from your wound.  You notice a change in the color of your skin near your wound.  You need to change the bandage often because fluid, blood, or pus is coming from the wound.  You start to have a new rash.  You start to have numbness around the wound. Get help right away if:  You have very bad swelling around the wound.  Your pain suddenly gets worse and is very bad.  You notice painful lumps near the wound or anywhere on your body.  You have a red streak going away from your wound.  The wound is on your hand or foot, and: ? You cannot move a finger or toe. ? Your fingers or toes look pale or bluish. Summary  A laceration is a cut that may go through all layers of the skin. The cut may also go into the tissue right under the skin.  Some cuts heal on their own. Others need to be closed with stitches, staples, skin adhesive strips, or skin glue.  Follow your doctor's instructions for caring for your cut. Proper care of a cut lowers the risk of infection, helps the cut heal better, and prevents scarring. This information is not intended to replace advice given to you by your health care provider. Make sure you discuss any questions you have with your health care provider. Document Released: 09/01/2007 Document Revised: 05/13/2017 Document Reviewed: 04/04/2017 Elsevier Patient Education  2020 [employer].

## 2018-10-16 NOTE — Progress Notes (Signed)
Subjective:    Patient ID: Rebecca Park, female    DOB: 10-Sep-1960, 58 y.o.   MRN: [id number]  HPI  Patient presents to the clinic today with complaints of a possibly infected cut to the bottom of her right foot which she sustained 8 days ago. She tripped over a 2 x 4 and cut her foot on the timber She has kept it clean and has been applying antibiotic ointment ever since.  She is concerned about healing because she is diabetic. She started to notice redness to her 5th digit on right foot with associated swelling. Her last Tetanus vaccine was 05/2018.   Review of Systems  Past Medical History:  Diagnosis Date  . Diabetes mellitus without complication ([place])   . Hypertension     Current Outpatient Medications  Medication Sig Dispense Refill  . amLODipine (NORVASC) 5 MG tablet TAKE 1 TABLET BY MOUTH EVERY DAY 90 tablet 0  . atorvastatin (LIPITOR) 20 MG tablet Take 1 tablet (20 mg total) by mouth daily. 90 tablet 3  . glipiZIDE (GLUCOTROL) 10 MG tablet Take 1 tablet (10 mg total) by mouth daily before breakfast. 60 tablet 2  . hydrochlorothiazide (HYDRODIURIL) 25 MG tablet Take 1 tablet (25 mg total) by mouth daily. 90 tablet 3  . losartan (COZAAR) 50 MG tablet Take 2 tablets (100 mg total) by mouth daily. 180 tablet 0  . Multiple Vitamins-Minerals (WOMENS MULTIVITAMIN PO) Take by mouth.    . Vitamin D, Ergocalciferol, (DRISDOL) 1.25 MG (50000 UT) CAPS capsule Take 1 capsule (50,000 Units total) by mouth every 7 (seven) days. 12 capsule 0   No current facility-administered medications for this visit.     No Known Allergies  Family History  Problem Relation Age of Onset  . Heart disease Father   . Heart attack Father   . Heart disease Maternal Grandfather     Social History   Socioeconomic History  . Marital status: Married    Spouse name: Not on file  . Number of children: Not on file  . Years of education: Not on file  . Highest education level: Not on file  Occupational  History  . Not on file  Social Needs  . Financial resource strain: Not on file  . Food insecurity    Worry: Not on file    Inability: Not on file  . Transportation needs    Medical: Not on file    Non-medical: Not on file  Tobacco Use  . Smoking status: Never Smoker  . Smokeless tobacco: Never Used  Substance and Sexual Activity  . Alcohol use: Yes    Comment: recently quit 01/2018, 3-4 beers QD  . Drug use: Never  . Sexual activity: Not on file  Lifestyle  . Physical activity    Days per week: Not on file    Minutes per session: Not on file  . Stress: Not on file  Relationships  . Social Herbalist on phone: Not on file    Gets together: Not on file    Attends religious service: Not on file    Active member of club or organization: Not on file    Attends meetings of clubs or organizations: Not on file    Relationship status: Not on file  . Intimate partner violence    Fear of current or ex partner: Not on file    Emotionally abused: Not on file    Physically abused: Not on file  Forced sexual activity: Not on file  Other Topics Concern  . Not on file  Social History Narrative  . Not on file     Constitutional: Denies fever, malaise, fatigue, headache or abrupt weight changes.  Cardiovascular: Pt reports swelling to BLE.  Denies chest pain, chest tightness, palpitations. Skin: Pt reports laceration to bottom of right foot with redness and warmth.  No other specific complaints in a complete review of systems (except as listed in HPI above).     Objective:   Physical Exam  BP (!) 144/86   Pulse 100   Temp 98.4 F (36.9 C) (Oral)   Wt 223 lb (101.2 kg)   SpO2 98%   BMI 35.99 kg/m    Wt Readings from Last 3 Encounters:  09/15/18 221 lb 12 oz (100.6 kg)  09/07/18 220 lb (99.8 kg)  06/06/18 212 lb (96.2 kg)    General: Appears her stated age, obese. Skin: 1 in long, 2-3 mm deep laceration to right plantar surface of the foot just below the  toes with gray, pale skin with associated warmth, edema, erythema extending to the anterior part of the foot near the 5th digit.   BMET    Component Value Date/Time   NA 139 09/15/2018 1545   K 4.0 09/15/2018 1545   CL 98 09/15/2018 1545   CO2 28 09/15/2018 1545   GLUCOSE 144 (H) 09/15/2018 1545   BUN 28 (H) 09/15/2018 1545   CREATININE 0.93 09/15/2018 1545   CALCIUM 10.0 09/15/2018 1545    Lipid Panel     Component Value Date/Time   CHOL 201 (H) 09/15/2018 1545   TRIG 195 (H) 09/15/2018 1545   HDL 60 09/15/2018 1545   CHOLHDL 3.4 09/15/2018 1545   VLDL 19.6 06/06/2018 0912   LDLCALC 110 (H) 09/15/2018 1545    CBC    Component Value Date/Time   WBC 7.2 06/06/2018 0912   RBC 4.52 06/06/2018 0912   HGB 13.4 06/06/2018 0912   HCT 39.4 06/06/2018 0912   PLT 155.0 06/06/2018 0912   MCV 87.3 06/06/2018 0912   MCHC 34.0 06/06/2018 0912   RDW 13.6 06/06/2018 0912    Hgb A1C Lab Results  Component Value Date   HGBA1C 6.8 (H) 09/15/2018           Assessment & Plan:   Laceration to Plantar Area of Right Foot:  DG right foot- checking for foreign body and infection Instructed patient to keep foot clean and dry.   Can do Epsom salt soaks.  Cellulitis of Right Foot:  Keflex 500 mg TID x 10 days Encouraged elevation  Return precautions discussed.  Webb Silversmith, NP

## 2018-10-19 ENCOUNTER — Encounter: Payer: Self-pay | Admitting: Internal Medicine

## 2018-10-27 ENCOUNTER — Encounter: Payer: Self-pay | Admitting: Internal Medicine

## 2018-10-27 DIAGNOSIS — E11628 Type 2 diabetes mellitus with other skin complications: Secondary | ICD-10-CM

## 2018-10-27 DIAGNOSIS — S91311D Laceration without foreign body, right foot, subsequent encounter: Secondary | ICD-10-CM

## 2018-10-30 ENCOUNTER — Telehealth: Payer: Self-pay | Admitting: *Deleted

## 2018-10-30 ENCOUNTER — Ambulatory Visit: Payer: BC Managed Care – PPO | Admitting: Internal Medicine

## 2018-10-30 ENCOUNTER — Other Ambulatory Visit: Payer: Self-pay | Admitting: Internal Medicine

## 2018-10-30 DIAGNOSIS — R509 Fever, unspecified: Secondary | ICD-10-CM | POA: Diagnosis not present

## 2018-10-30 DIAGNOSIS — Z03818 Encounter for observation for suspected exposure to other biological agents ruled out: Secondary | ICD-10-CM | POA: Diagnosis not present

## 2018-10-30 DIAGNOSIS — Z209 Contact with and (suspected) exposure to unspecified communicable disease: Secondary | ICD-10-CM | POA: Diagnosis not present

## 2018-10-30 DIAGNOSIS — R110 Nausea: Secondary | ICD-10-CM | POA: Diagnosis not present

## 2018-10-30 NOTE — Telephone Encounter (Signed)
Pt going to [facility] and number was given to call... pt also asked about the wound clinic referral... did not see please advise

## 2018-10-30 NOTE — Telephone Encounter (Signed)
Patient called stating that she has a wound on her leg and is being referred to the [facility].  Patient stated that the area on her leg is no longer red and swollen. Patient stated that the only problem that she is having now is it continues to bleed. Patient stated that she started late Saturday with nausea, back ache and fever. Patient stated that her fever now is 101.3 and she has been feeling horrible. Patient scheduled for a virtual visit today at 3:45. Patient stated that she is not aware of being exposed to Covid that she is aware of.

## 2018-10-30 NOTE — Telephone Encounter (Signed)
Noted.  Agree with the advice given.

## 2018-10-30 NOTE — Telephone Encounter (Signed)
Per verbal order from [facility], pt needs to go to urgent care, can not wait for an afternoon appt

## 2018-11-06 ENCOUNTER — Other Ambulatory Visit: Payer: Self-pay | Admitting: Physician Assistant

## 2018-11-06 ENCOUNTER — Other Ambulatory Visit
Admission: RE | Admit: 2018-11-06 | Discharge: 2018-11-06 | Disposition: A | Discharge status: Home / Self Care | Payer: BC Managed Care – PPO | Source: Ambulatory Visit | Attending: Physician Assistant | Admitting: Physician Assistant

## 2018-11-06 ENCOUNTER — Other Ambulatory Visit: Payer: Self-pay

## 2018-11-06 ENCOUNTER — Encounter: Payer: BC Managed Care – PPO | Attending: Physician Assistant | Admitting: Physician Assistant

## 2018-11-06 DIAGNOSIS — Z8249 Family history of ischemic heart disease and other diseases of the circulatory system: Secondary | ICD-10-CM | POA: Insufficient documentation

## 2018-11-06 DIAGNOSIS — I10 Essential (primary) hypertension: Secondary | ICD-10-CM | POA: Insufficient documentation

## 2018-11-06 DIAGNOSIS — L97513 Non-pressure chronic ulcer of other part of right foot with necrosis of muscle: Secondary | ICD-10-CM | POA: Insufficient documentation

## 2018-11-06 DIAGNOSIS — E11621 Type 2 diabetes mellitus with foot ulcer: Secondary | ICD-10-CM | POA: Diagnosis not present

## 2018-11-06 DIAGNOSIS — L03031 Cellulitis of right toe: Secondary | ICD-10-CM | POA: Insufficient documentation

## 2018-11-06 DIAGNOSIS — B9689 Other specified bacterial agents as the cause of diseases classified elsewhere: Secondary | ICD-10-CM | POA: Diagnosis not present

## 2018-11-06 DIAGNOSIS — M86171 Other acute osteomyelitis, right ankle and foot: Secondary | ICD-10-CM | POA: Diagnosis not present

## 2018-11-06 DIAGNOSIS — E119 Type 2 diabetes mellitus without complications: Secondary | ICD-10-CM | POA: Diagnosis not present

## 2018-11-06 NOTE — Progress Notes (Signed)
[name], [name] ([id number]) Visit Report for 11/06/2018 Abuse/Suicide Risk Screen Details Patient Name: Rebecca Park, [name] Date of Service: 11/06/2018 9:45 AM Medical Record Number: [id number] Patient Account Number: [id number] Date of Birth/Sex: 26-Oct-1960 (58 y.o. F) Treating RN: Rebecca Park Primary Care Rebecca Park: Rebecca Park Other Clinician: Referring Rebecca Park: Rebecca Park Treating Rebecca Park/Extender: Rebecca Park Weeks in Treatment: 0 Abuse/Suicide Risk Screen Items Answer ABUSE RISK SCREEN: Has anyone close to you tried to hurt or harm you recentlyo No Do you feel uncomfortable with anyone in your familyo No Has anyone forced you do things that you didnot want to doo No Electronic Signature(s) Signed: 11/06/2018 5:04:06 PM By: Rebecca Park, BSN, RN, CWS, Rebecca Park Entered By: Rebecca Park, BSN, RN, CWS, Rebecca on 11/06/2018 10:22:40 Rebecca Park, [name] ([id number]) -------------------------------------------------------------------------------- Activities of Daily Living Details Patient Name: [name], [name] Date of Service: 11/06/2018 9:45 AM Medical Record Number: [id number] Patient Account Number: [id number] Date of Birth/Sex: [birth year] (58 y.o. F) Treating RN: Rebecca Park Primary Care Rebecca Park: Rebecca Park Other Clinician: Referring Rebecca Park: Rebecca Park Treating Gissell Barra/Extender: Rebecca Park Weeks in Treatment: 0 Activities of Daily Living Items Answer Activities of Daily Living (Please select one for each item) Drive Automobile Completely Able Take Medications Completely Able Use Telephone Completely Able Care for Appearance Completely Able Use Toilet Completely Able Bath / Shower Completely Able Dress Self Completely Able Feed Self Completely Able Walk Completely Able Get In / Out Bed Completely Able Housework Completely Able Prepare Meals Completely Able Handle Money Completely Able Shop for Self Completely Able Electronic Signature(s) Signed: 11/06/2018 5:04:06 PM By:  Rebecca Park, BSN, RN, CWS, Rebecca Park Entered By: Rebecca Park, BSN, RN, CWS, Rebecca on 11/06/2018 10:22:51 Rebecca Park ([id number]) -------------------------------------------------------------------------------- Education Screening Details Patient Name: Rebecca Park, [name] Date of Service: 11/06/2018 9:45 AM Medical Record Number: [id number] Patient Account Number: [id number] Date of Birth/Sex: May 23, 1960 (58 y.o. F) Treating RN: Rebecca Park Primary Care Rebecca Park: Rebecca Park Other Clinician: Referring Rebecca Park: Rebecca Park Treating Rebecca Park/Extender: Rebecca Park Weeks in Treatment: 0 Learning Preferences/Education Level/Primary Language Learning Preference: Explanation, Demonstration Highest Education Level: College or Above Preferred Language: English Cognitive Barrier Language Barrier: No Translator Needed: No Memory Deficit: No Emotional Barrier: No Cultural/Religious Beliefs Affecting Medical Care: No Physical Barrier Impaired Vision: No Impaired Hearing: No Decreased Hand dexterity: No Knowledge/Comprehension Knowledge Level: High Comprehension Level: High Ability to understand written High instructions: Ability to understand verbal High instructions: Motivation Anxiety Level: Calm Cooperation: Cooperative Education Importance: Acknowledges Need Interest in Health Problems: Asks Questions Perception: Coherent Willingness to Engage in Self- High Management Activities: Readiness to Engage in Self- High Management Activities: Electronic Signature(s) Signed: 11/06/2018 5:04:06 PM By: Rebecca Park, BSN, RN, CWS, Rebecca Park Entered By: Rebecca Park, BSN, RN, CWS, Rebecca on 11/06/2018 10:23:32 Rebecca Park ([id number]) -------------------------------------------------------------------------------- Fall Risk Assessment Details Patient Name: Rebecca Park, [name] Date of Service: 11/06/2018 9:45 AM Medical Record Number: [id number] Patient Account Number: [id number] Date of Birth/Sex:  04/13/1960 (58 y.o. F) Treating RN: Rebecca Park Primary Care Rebecca Park: Rebecca Park Other Clinician: Referring Rebecca Park: Rebecca Park Treating Rebecca Park/Extender: Rebecca Park Weeks in Treatment: 0 Fall Risk Assessment Items Have you had 2 or more falls in the last 12 monthso 0 No Have you had any fall that resulted in injury in the last 12 monthso 0 No FALLS RISK SCREEN History of falling - immediate or within 3 months 0 No Secondary diagnosis (Do you have 2 or more medical diagnoseso) 0 No Ambulatory aid None/bed rest/wheelchair/nurse 0  Yes Crutches/cane/walker 0 No Furniture 0 No Intravenous therapy Access/Saline/Heparin Lock 0 No Gait/Transferring Normal/ bed rest/ wheelchair 0 Yes Weak (short steps with or without shuffle, stooped but able to lift head while 0 No walking, may seek support from furniture) Impaired (short steps with shuffle, may have difficulty arising from chair, head 0 No down, impaired balance) Mental Status Oriented to own ability 0 Yes Electronic Signature(s) Signed: 11/06/2018 5:04:06 PM By: Rebecca Park, BSN, RN, CWS, Rebecca Park Entered By: Rebecca Park, BSN, RN, CWS, Rebecca on 11/06/2018 10:24:09 Rebecca Park, [name] ([id number]) -------------------------------------------------------------------------------- Foot Assessment Details Patient Name: Rebecca Park, [name] Date of Service: 11/06/2018 9:45 AM Medical Record Number: [id number] Patient Account Number: [id number] Date of Birth/Sex: January 22, 1961 (58 y.o. F) Treating RN: Rebecca Park Primary Care Linc Renne: Rebecca Park Other Clinician: Referring Rebecca Park: Rebecca Park Treating Rebecca Park/Extender: Rebecca Park Weeks in Treatment: 0 Foot Assessment Items Site Locations + = Sensation present, - = Sensation absent, C = Callus, U = Ulcer R = Redness, W = Warmth, M = Maceration, PU = Pre-ulcerative lesion F = Fissure, S = Swelling, D = Dryness Assessment Right: Left: Other Deformity: No No Prior Foot Ulcer: No  No Prior Amputation: No No Charcot Joint: No No Ambulatory Status: Ambulatory Without Help Gait: Steady Electronic Signature(s) Signed: 11/06/2018 5:04:06 PM By: Rebecca Park, BSN, RN, CWS, Rebecca Park Entered By: Rebecca Park, BSN, RN, CWS, Rebecca on 11/06/2018 10:28:20 Rebecca Park Kitchen ([id number]) -------------------------------------------------------------------------------- Nutrition Risk Screening Details Patient Name: Rebecca Park, [name] Date of Service: 11/06/2018 9:45 AM Medical Record Number: [id number] Patient Account Number: [id number] Date of Birth/Sex: Aug 02, 1960 (58 y.o. F) Treating RN: Rebecca Park Primary Care Colette Dicamillo: Rebecca Park Other Clinician: Referring Aquilla Shambley: Rebecca Park Treating Demarea Lorey/Extender: Rebecca Park Weeks in Treatment: 0 Height (in): 66 Weight (lbs): 209 Body Mass Index (BMI): 33.7 Nutrition Risk Screening Items Score Screening NUTRITION RISK SCREEN: I have an illness or condition that made me change the kind and/or amount of 0 No food I eat I eat fewer than two meals per day 0 No I eat few fruits and vegetables, or milk products 0 No I have three or more drinks of beer, liquor or wine almost every day 0 No I have tooth or mouth problems that make it hard for me to eat 0 No I don't always have enough money to buy the food I need 0 No I eat alone most of the time 0 No I take three or more different prescribed or over-the-counter drugs a day 1 Yes Without wanting to, I have lost or gained 10 pounds in the last six months 0 No I am not always physically able to shop, cook and/or feed myself 0 No Nutrition Protocols Good Risk Protocol 0 No interventions needed Moderate Risk Protocol High Risk Proctocol Risk Level: Good Risk Score: 1 Electronic Signature(s) Signed: 11/06/2018 5:04:06 PM By: Rebecca Park, BSN, RN, CWS, Rebecca Park Entered By: Rebecca Park, BSN, RN, CWS, Rebecca on 11/06/2018 10:24:36

## 2018-11-06 NOTE — Progress Notes (Addendum)
Rebecca Park, Rebecca Park ([id number]) Visit Report for 11/06/2018 Allergy List Details Patient Name: Batty, Verline L. Date of Service: 11/06/2018 9:45 AM Medical Record Number: [id number] Patient Account Number: [id number] Date of Birth/Sex: [birth year] (58 y.o. F) Treating RN: Cornell Barman Primary Care Tallan Sandoz: Webb Silversmith Other Clinician: Referring Andranik Jeune: Webb Silversmith Treating Samanthajo Payano/Extender: Melburn Hake, HOYT Weeks in Treatment: 0 Allergies Active Allergies No Known Drug Allergies Allergy Notes Electronic Signature(s) Signed: 11/06/2018 5:04:06 PM By: Gretta Cool, BSN, RN, CWS, Kim RN, BSN Entered By: Gretta Cool, BSN, RN, CWS, Kim on 11/06/2018 10:17:35 Aggarwal, Su Grand ([id number]) -------------------------------------------------------------------------------- Arrival Information Details Patient Name: [name], [name] Date of Service: 11/06/2018 9:45 AM Medical Record Number: [id number] Patient Account Number: [id number] Date of Birth/Sex: 09-09-1960 (58 y.o. F) Treating RN: Harold Barban Primary Care Kahley Leib: Webb Silversmith Other Clinician: Referring Seidy Labreck: Webb Silversmith Treating Deuntae Kocsis/Extender: Melburn Hake, HOYT Weeks in Treatment: 0 Visit Information Patient Arrived: Ambulatory Arrival Time: 10:05 Accompanied By: self Transfer Assistance: None Patient Identification Verified: Yes Secondary Verification Process Completed: Yes Patient Requires Transmission-Based No Precautions: Patient Has Alerts: Yes Patient Alerts: Type II Diabetic Electronic Signature(s) Signed: 11/06/2018 5:04:06 PM By: Gretta Cool, BSN, RN, CWS, Kim RN, BSN Entered By: Gretta Cool, BSN, RN, CWS, Kim on 11/06/2018 10:15:18 Burroughs, Su Grand ([id number]) -------------------------------------------------------------------------------- Clinic Level of Care Assessment Details Patient Name: Rebecca Park, [name] Date of Service: 11/06/2018 9:45 AM Medical Record Number: [id number] Patient Account Number: [id number] Date of Birth/Sex:  05-Nov-1960 (58 y.o. F) Treating RN: Harold Barban Primary Care Karanvir Balderston: Webb Silversmith Other Clinician: Referring Jaimarie Rapozo: Webb Silversmith Treating Kambria Grima/Extender: Melburn Hake, HOYT Weeks in Treatment: 0 Clinic Level of Care Assessment Items TOOL 2 Quantity Score []  - Use when only an EandM is performed on the INITIAL visit 0 ASSESSMENTS - Nursing Assessment / Reassessment X - General Physical Exam (combine w/ comprehensive assessment (listed just below) when 1 20 performed on new pt. evals) X- 1 25 Comprehensive Assessment (HX, ROS, Risk Assessments, Wounds Hx, etc.) ASSESSMENTS - Wound and Skin Assessment / Reassessment X - Simple Wound Assessment / Reassessment - one wound 1 5 []  - 0 Complex Wound Assessment / Reassessment - multiple wounds []  - 0 Dermatologic / Skin Assessment (not related to wound area) ASSESSMENTS - Ostomy and/or Continence Assessment and Care []  - Incontinence Assessment and Management 0 []  - 0 Ostomy Care Assessment and Management (repouching, etc.) PROCESS - Coordination of Care X - Simple Patient / Family Education for ongoing care 1 15 []  - 0 Complex (extensive) Patient / Family Education for ongoing care []  - 0 Staff obtains Programmer, systems, Records, Test Results / Process Orders []  - 0 Staff telephones HHA, Nursing Homes / Clarify orders / etc []  - 0 Routine Transfer to another Facility (non-emergent condition) []  - 0 Routine Hospital Admission (non-emergent condition) []  - 0 New Admissions / Biomedical engineer / Ordering NPWT, Apligraf, etc. []  - 0 Emergency Hospital Admission (emergent condition) X- 1 10 Simple Discharge Coordination []  - 0 Complex (extensive) Discharge Coordination PROCESS - Special Needs []  - Pediatric / Minor Patient Management 0 []  - 0 Isolation Patient Management Ibsen, Fatimah L. ([id number]) []  - 0 Hearing / Language / Visual special needs []  - 0 Assessment of Community assistance (transportation, D/C planning,  etc.) []  - 0 Additional assistance / Altered mentation []  - 0 Support Surface(s) Assessment (bed, cushion, seat, etc.) INTERVENTIONS - Wound Cleansing / Measurement X - Wound Imaging (photographs - any number of wounds) 1 5 []  - 0 Wound Tracing (instead of photographs) X-  1 5 Simple Wound Measurement - one wound []  - 0 Complex Wound Measurement - multiple wounds X- 1 5 Simple Wound Cleansing - one wound []  - 0 Complex Wound Cleansing - multiple wounds INTERVENTIONS - Wound Dressings X - Small Wound Dressing one or multiple wounds 1 10 []  - 0 Medium Wound Dressing one or multiple wounds []  - 0 Large Wound Dressing one or multiple wounds []  - 0 Application of Medications - injection INTERVENTIONS - Miscellaneous []  - External ear exam 0 []  - 0 Specimen Collection (cultures, biopsies, blood, body fluids, etc.) []  - 0 Specimen(s) / Culture(s) sent or taken to Lab for analysis []  - 0 Patient Transfer (multiple staff / Civil Service fast streamer / Similar devices) []  - 0 Simple Staple / Suture removal (25 or less) []  - 0 Complex Staple / Suture removal (26 or more) []  - 0 Hypo / Hyperglycemic Management (close monitor of Blood Glucose) X- 1 15 Ankle / Brachial Index (ABI) - do not check if billed separately Has the patient been seen at the hospital within the last three years: Yes Total Score: 115 Level Of Care: New/Established - Level 3 Electronic Signature(s) Signed: 11/06/2018 4:21:56 PM By: Harold Barban Entered By: Harold Barban on 11/06/2018 11:16:27 Henigan, Trixie L. ([id number]) -------------------------------------------------------------------------------- Encounter Discharge Information Details Patient Name: Rebecca Park, [name] Date of Service: 11/06/2018 9:45 AM Medical Record Number: [id number] Patient Account Number: [id number] Date of Birth/Sex: 11-27-1960 (58 y.o. F) Treating RN: Army Melia Primary Care Tonantzin Mimnaugh: Webb Silversmith Other Clinician: Referring Jackob Crookston: Webb Silversmith Treating Dee Maday/Extender: Melburn Hake, HOYT Weeks in Treatment: 0 Encounter Discharge Information Items Post Procedure Vitals Discharge Condition: Stable Temperature (F): 99.2 Ambulatory Status: Ambulatory Pulse (bpm): 115 Discharge Destination: Home Respiratory Rate (breaths/min): 16 Transportation: Private Auto Blood Pressure (mmHg): 153/84 Accompanied By: mother Schedule Follow-up Appointment: Yes Clinical Summary of Care: Electronic Signature(s) Signed: 11/06/2018 11:53:26 AM By: Army Melia Entered By: Army Melia on 11/06/2018 11:27:21 Hanssen, Sury L. ([id number]) -------------------------------------------------------------------------------- Lower Extremity Assessment Details Patient Name: Rebecca Park, [name] Date of Service: 11/06/2018 9:45 AM Medical Record Number: [id number] Patient Account Number: [id number] Date of Birth/Sex: December 21, 1960 (58 y.o. F) Treating RN: Cornell Barman Primary Care Shubh Chiara: Webb Silversmith Other Clinician: Referring Chrislynn Mosely: Webb Silversmith Treating Elanie Hammitt/Extender: Melburn Hake, HOYT Weeks in Treatment: 0 Edema Assessment Assessed: [Left: No] [Right: Yes] Edema: [Left: N] [Right: o] Vascular Assessment Pulses: Dorsalis Pedis Palpable: [Right:Yes] Doppler Audible: [Right:Yes] Posterior Tibial Palpable: [Right:Yes] Doppler Audible: [Right:Yes] Blood Pressure: Brachial: [Right:130] Dorsalis Pedis: 158 Ankle: Posterior Tibial: 150 Ankle Brachial Index: [Right:1.22] Notes Patient wound is on the foot and was from trauma.. Did not do left ABI. Electronic Signature(s) Signed: 11/06/2018 5:04:06 PM By: Gretta Cool, BSN, RN, CWS, Kim RN, BSN Entered By: Gretta Cool, BSN, RN, CWS, Kim on 11/06/2018 10:45:15 Gallaga, Su Grand ([id number]) -------------------------------------------------------------------------------- Multi Wound Chart Details Patient Name: Rebecca Park, [name] Date of Service: 11/06/2018 9:45 AM Medical Record Number: [id number] Patient  Account Number: [id number] Date of Birth/Sex: 1960-04-28 (58 y.o. F) Treating RN: Harold Barban Primary Care Jerrie Schussler: Webb Silversmith Other Clinician: Referring Nazirah Tri: Webb Silversmith Treating Aeson Sawyers/Extender: STONE III, HOYT Weeks in Treatment: 0 Vital Signs Height(in): 66 Pulse(bpm): 115 Weight(lbs): 209 Blood Pressure(mmHg): 153/84 Body Mass Index(BMI): 34 Temperature(F): 99.2 Respiratory Rate 18 (breaths/min): Photos: [N/A:N/A] Wound Location: Right Toe - Web between 4th N/A N/A and 5th Wounding Event: Trauma N/A N/A Primary Etiology: Trauma, Other N/A N/A Secondary Etiology: Diabetic Wound/Ulcer of the N/A N/A Lower Extremity Comorbid History: Hypertension, Type II Diabetes N/A N/A  Date Acquired: 10/08/2018 N/A N/A Weeks of Treatment: 0 N/A N/A Wound Status: Open N/A N/A Pending Amputation on Yes N/A N/A Presentation: Shira, [place] ([id number]) Measurements L x W x D 3.5x1.5x1.2 N/A N/A (cm) Area (cm) : 4.123 N/A N/A Volume (cm) : 4.948 N/A N/A % Reduction in Area: 0.00% N/A N/A % Reduction in Volume: 0.00% N/A N/A Classification: Full Thickness Without N/A N/A Exposed Support Structures Exudate Amount: Large N/A N/A Exudate Type: Serous N/A N/A Exudate Color: amber N/A N/A Wound Margin: Flat and Intact N/A N/A Granulation Amount: None Present (0%) N/A N/A Necrotic Amount: Large (67-100%) N/A N/A Exposed Structures: Fat Layer (Subcutaneous N/A N/A Tissue) Exposed: Yes Fascia: No Tendon: No Muscle: No Joint: No Bone: No Epithelialization: None N/A N/A Assessment Notes: wound is macerated. N/A N/A Treatment Notes Electronic Signature(s) Signed: 11/06/2018 4:21:56 PM By: Harold Barban Entered By: Harold Barban on 11/06/2018 10:59:49 Loudermilk, Gizel Carlean Jews ([id number]) -------------------------------------------------------------------------------- [place] Details Patient Name: Rebecca Park, [name] Date of Service: 11/06/2018 9:45  AM Medical Record Number: [id number] Patient Account Number: [id number] Date of Birth/Sex: Nov 12, 1960 (58 y.o. F) Treating RN: Harold Barban Primary Care Ruhee Enck: Webb Silversmith Other Clinician: Referring Aneeka Bowden: Webb Silversmith Treating Aamari West/Extender: Melburn Hake, HOYT Weeks in Treatment: 0 Active Inactive Electronic Signature(s) Signed: 11/13/2018 11:41:19 AM By: Gretta Cool, BSN, RN, CWS, Kim RN, BSN Signed: 11/24/2018 1:06:48 PM By: Harold Barban Previous Signature: 11/06/2018 4:21:56 PM Version By: Harold Barban Entered By: Gretta Cool BSN, RN, CWS, Kim on 11/13/2018 11:41:18 [place], [place] ([id number]) -------------------------------------------------------------------------------- Pain Assessment Details Patient Name: Rebecca Park, [name] Date of Service: 11/06/2018 9:45 AM Medical Record Number: [id number] Patient Account Number: [id number] Date of Birth/Sex: 26-Dec-1960 (58 y.o. F) Treating RN: Harold Barban Primary Care Jalesha Plotz: Webb Silversmith Other Clinician: Referring Gwendolen Hewlett: Webb Silversmith Treating Makaia Rappa/Extender: Melburn Hake, HOYT Weeks in Treatment: 0 Active Problems Location of Pain Severity and Description of Pain Patient Has Paino No Site Locations Pain Management and Medication Current Pain Management: Notes Patient denies pain at this time. Electronic Signature(s) Signed: 11/06/2018 4:21:56 PM By: Harold Barban Signed: 11/06/2018 5:04:06 PM By: Gretta Cool, BSN, RN, CWS, Kim RN, BSN Entered By: Gretta Cool, BSN, RN, CWS, Kim on 11/06/2018 10:15:47 Styles, Su Grand ([id number]) -------------------------------------------------------------------------------- Patient/Caregiver Education Details Patient Name: Rebecca Park, [name] Date of Service: 11/06/2018 9:45 AM Medical Record Number: [id number] Patient Account Number: [id number] Date of Birth/Gender: June 12, 1960 (58 y.o. F) Treating RN: Harold Barban Primary Care Physician: Webb Silversmith Other Clinician: Referring Physician: Webb Silversmith Treating Physician/Extender: Melburn Hake, HOYT Weeks in Treatment: 0 Education Assessment Education Provided To: Patient Education Topics Provided Pressure: Handouts: Pressure Ulcers: Care and Offloading Methods: Demonstration, Explain/Verbal Responses: State content correctly Welcome [facility]: Handouts: Welcome [facility] Methods: Demonstration, Explain/Verbal Responses: State content correctly Wound/Skin Impairment: Handouts: Caring for Your Ulcer Methods: Demonstration, Explain/Verbal Responses: State content correctly Electronic Signature(s) Signed: 11/06/2018 4:21:56 PM By: Harold Barban Entered By: Harold Barban on 11/06/2018 11:00:27 Hornberger, Vinia L. ([id number]) -------------------------------------------------------------------------------- Wound Assessment Details Patient Name: Rebecca Park, [name] Date of Service: 11/06/2018 9:45 AM Medical Record Number: [id number] Patient Account Number: [id number] Date of Birth/Sex: [birth year] (58 y.o. F) Treating RN: Cornell Barman Primary Care Shevon Sian: Webb Silversmith Other Clinician: Referring Ashlin Hidalgo: Webb Silversmith Treating Lecretia Buczek/Extender: STONE III, HOYT Weeks in Treatment: 0 Wound Status Wound Number: 1 Primary Etiology: Trauma, Other Wound Location: Right Toe - Web between 4th and 5th Secondary Diabetic Wound/Ulcer of the Lower Etiology: Extremity Wounding Event: Trauma Wound Status: Open  Date Acquired: 10/08/2018 Comorbid History: Hypertension, Type II Diabetes Weeks Of Treatment: 0 Clustered Wound: No Pending Amputation On Presentation Photos Wound Measurements Length: (cm) 3.5 Width: (cm) 1.5 Depth: (cm) 1.2 Area: (cm) 4.123 Volume: (cm) 4.948 % Reduction in Area: 0% % Reduction in Volume: 0% Epithelialization: None Tunneling: No Undermining: No Wound Description Full Thickness Without Exposed Support Classification: Structures Wound Margin: Flat and  Intact Exudate Large Amount: Exudate Type: Serous Exudate Color: amber Foul Odor After Cleansing: No Slough/Fibrino Yes Wound Bed Granulation Amount: None Present (0%) Exposed Structure Necrotic Amount: Large (67-100%) Fascia Exposed: No Necrotic Quality: Adherent Slough Fat Layer (Subcutaneous Tissue) Exposed: Yes Tendon Exposed: No Muscle Exposed: No Joint Exposed: No Bone Exposed: No Rhoda, Ondrea L. ([id number]) Assessment Notes wound is macerated. Electronic Signature(s) Signed: 11/06/2018 1:39:53 PM By: Harold Barban Signed: 11/06/2018 5:04:06 PM By: Gretta Cool, BSN, RN, CWS, Kim RN, BSN Entered By: Harold Barban on 11/06/2018 13:39:52 Rebecca Park, [name] ([id number]) -------------------------------------------------------------------------------- Vitals Details Patient Name: Rebecca Park, [name] Date of Service: 11/06/2018 9:45 AM Medical Record Number: [id number] Patient Account Number: [id number] Date of Birth/Sex: [birth year] (58 y.o. F) Treating RN: Harold Barban Primary Care Ayva Veilleux: Webb Silversmith Other Clinician: Referring Cniyah Sproull: Webb Silversmith Treating Sabirin Baray/Extender: STONE III, HOYT Weeks in Treatment: 0 Vital Signs Time Taken: 10:10 Temperature (F): 99.2 Height (in): 66 Pulse (bpm): 115 Source: Stated Respiratory Rate (breaths/min): 18 Weight (lbs): 209 Blood Pressure (mmHg): 153/84 Source: Measured Reference Range: 80 - 120 mg / dl Body Mass Index (BMI): 33.7 Notes Patient states she has not taken any of her medications today. BP 153/84 Damoney Julia notified, patient instructed to follow-up with PCP if this continues. Electronic Signature(s) Signed: 11/06/2018 5:04:06 PM By: Gretta Cool, BSN, RN, CWS, Kim RN, BSN Entered By: Gretta Cool, BSN, RN, CWS, Kim on 11/06/2018 10:17:23

## 2018-11-07 ENCOUNTER — Ambulatory Visit
Admission: RE | Admit: 2018-11-07 | Discharge: 2018-11-07 | Disposition: A | Discharge status: Home / Self Care | Payer: BC Managed Care – PPO | Source: Ambulatory Visit | Attending: Physician Assistant | Admitting: Physician Assistant

## 2018-11-07 DIAGNOSIS — L97513 Non-pressure chronic ulcer of other part of right foot with necrosis of muscle: Secondary | ICD-10-CM

## 2018-11-07 DIAGNOSIS — S91311A Laceration without foreign body, right foot, initial encounter: Secondary | ICD-10-CM | POA: Diagnosis not present

## 2018-11-07 LAB — POCT I-STAT CREATININE: Creatinine, Ser: 1.4 mg/dL — ABNORMAL HIGH (ref 0.44–1.00)

## 2018-11-07 MED ORDER — GADOBUTROL 1 MMOL/ML IV SOLN
9.0000 mL | Freq: Once | INTRAVENOUS | Status: AC | PRN
  Administered 2018-11-07: 10:00:00 9 mL via INTRAVENOUS

## 2018-11-07 NOTE — Progress Notes (Addendum)
CAEDENCE, SNOWDEN ([id number]) Visit Report for 11/06/2018 Chief Complaint Document Details Patient Name: Park, [name] Date of Service: 11/06/2018 9:45 AM Medical Record Number: [id number] Patient Account Number: [id number] Date of Birth/Sex: 10/22/1960 (58 y.o. F) Treating RN: Harold Barban Primary Care Provider: Webb Silversmith Other Clinician: Referring Provider: Webb Silversmith Treating Provider/Extender: Rebecca Park Weeks in Treatment: 0 Information Obtained from: Patient Chief Complaint Right foot ulcer Electronic Signature(s) Signed: 11/06/2018 1:00:42 PM By: Worthy Keeler PA-C Entered By: Worthy Keeler on 11/06/2018 13:00:42 Laconte, Graham. ([id number]) -------------------------------------------------------------------------------- Debridement Details Patient Name: Park, [name] Date of Service: 11/06/2018 9:45 AM Medical Record Number: [id number] Patient Account Number: [id number] Date of Birth/Sex: [birth year] (58 y.o. F) Treating RN: Harold Barban Primary Care Provider: Webb Silversmith Other Clinician: Referring Provider: Webb Silversmith Treating Provider/Extender: Rebecca Park Weeks in Treatment: 0 Debridement Performed for Wound #1 Right Toe - Web between 4th and 5th Assessment: Performed By: Physician Rebecca III, Anhthu Perdew E., PA-C Debridement Type: Debridement Severity of Tissue Pre Necrosis of muscle Debridement: Level of Consciousness (Pre- Awake and Alert procedure): Pre-procedure Verification/Time Yes - 11:05 Out Taken: Start Time: 11:05 Pain Control: Lidocaine Total Area Debrided (L x W): 3.5 (cm) x 1.5 (cm) = 5.25 (cm) Tissue and other material Viable, Non-Viable, Callus, Muscle, Slough, Subcutaneous, Skin: Epidermis, Slough debrided: Level: Skin/Subcutaneous Tissue/Muscle Debridement Description: Excisional Instrument: Curette, Forceps, Scissors Specimen: Tissue Culture Number of Specimens Taken: 1 Bleeding: Moderate Hemostasis Achieved:  Pressure End Time: 11:13 Procedural Pain: 0 Post Procedural Pain: 0 Response to Treatment: Procedure was tolerated well Level of Consciousness Awake and Alert (Post-procedure): Post Debridement Measurements of Total Wound Length: (cm) 3.5 Width: (cm) 1.5 Depth: (cm) 1.2 Volume: (cm) 4.948 Character of Wound/Ulcer Post Debridement: Improved Severity of Tissue Post Debridement: Necrosis of muscle Post Procedure Diagnosis Same as Pre-procedure Electronic Signature(s) Signed: 11/06/2018 1:43:09 PM By: Worthy Keeler PA-C Signed: 11/06/2018 4:21:56 PM By: Harold Barban Entered By: Worthy Keeler on 11/06/2018 13:43:08 Rebecca Park ([id number]) [place], Park ([id number]) -------------------------------------------------------------------------------- HPI Details Patient Name: Park, [name] Date of Service: 11/06/2018 9:45 AM Medical Record Number: [id number] Patient Account Number: [id number] Date of Birth/Sex: 1960-10-16 (58 y.o. F) Treating RN: Harold Barban Primary Care Provider: Webb Silversmith Other Clinician: Referring Provider: Webb Silversmith Treating Provider/Extender: Rebecca Hake, Ashvin Adelson Weeks in Treatment: 0 History of Present Illness HPI Description: 11/06/2018 upon evaluation today patient presents for initial evaluation in our clinic concerning issues that she is having with her right foot. This mainly involves the webspace between her fourth and fifth toes. With that being said this actually occurred as an injury on 10/08/2018. She was working in her garden when she inadvertently put down 1 of the tomato cages on her croc and did not realize it. When she went to walk away the [place] pulled off made her stumble and she actually hit a wood beam causing an injury/laceration to her foot. Subsequently she likely should have gone in and have this checked out immediately although about a week before she went to go see her primary care provider and according to the patient  he stated she likely had needed stitches initially. Obviously that point that was not a possibility. Since that time she was placed on Keflex which she finished about a week ago that was prescribed by her primary care provider. No culture was taken at that time. Subsequently she did have an x-ray which was negative she also does have a good ABI today at  1.22 here in the office. She does have hypertension as well as diabetes her most recent hemoglobin A1c was 6.8 and that was just recently she was only initially diagnosed around 6-9 months ago with diabetes. Obviously this is excellent progress during that time. She is obviously very concerned about her foot as well at this time this is a significant injury/ulcer. Electronic Signature(s) Signed: 11/06/2018 1:38:51 PM By: Worthy Keeler PA-C Entered By: Worthy Keeler on 11/06/2018 13:38:51 Rebecca Park ([id number]) -------------------------------------------------------------------------------- Physical Exam Details Patient Name: Park, [name] Date of Service: 11/06/2018 9:45 AM Medical Record Number: [id number] Patient Account Number: [id number] Date of Birth/Sex: [birth year] (58 y.o. F) Treating RN: Harold Barban Primary Care Provider: Webb Silversmith Other Clinician: Referring Provider: Webb Silversmith Treating Provider/Extender: Rebecca III, Kyair Ditommaso Weeks in Treatment: 0 Constitutional patient is hypertensive.. pulse regular and within target range for patient.Marland Kitchen respirations regular, non-labored and within target range for patient.Marland Kitchen temperature within target range for patient.. Well-nourished and well-hydrated in no acute distress. Eyes conjunctiva clear no eyelid edema noted. pupils equal round and reactive to light and accommodation. Ears, Nose, Mouth, and Throat no gross abnormality of ear auricles or external auditory canals. normal hearing noted during conversation. mucus membranes moist. Respiratory normal breathing without  difficulty. clear to auscultation bilaterally. Cardiovascular regular rate and rhythm with normal S1, S2. 2+ dorsalis pedis/posterior tibialis pulses. no clubbing, cyanosis, significant edema, <3 sec cap refill. Gastrointestinal (GI) soft, non-tender, non-distended, +BS. no ventral hernia noted. Musculoskeletal normal gait and posture. no significant deformity or arthritic changes, no loss or range of motion, no clubbing. Psychiatric this patient is able to make decisions and demonstrates good insight into disease process. Alert and Oriented x 3. pleasant and cooperative. Notes Upon inspection patient's wound bed actually showed a lot of necrotic tissue in the surface of the wound upon evaluation today. She actually did tolerate debridement today without complication she really did not seem to have any pain at this time. Fortunately there is no sign of active infection at this point either. Fortunately there does not appear to be any sign of systemic infection at this time. With that being said I do feel like she has a bit of local infection noted currently. Post debridement the wound bed appears to be doing better although again she still has quite a bit a ways to go before she will be out of the woods as far as necrotic tissue is concerned. I do believe this is can end up being a little bit deeper than what we see currently even though she did have a significant debridement today as well. Electronic Signature(s) Signed: 11/06/2018 1:40:19 PM By: Worthy Keeler PA-C Entered By: Worthy Keeler on 11/06/2018 13:40:19 Park, Rebecca Carlean Jews ([id number]) -------------------------------------------------------------------------------- Physician Orders Details Patient Name: Park, [name] Date of Service: 11/06/2018 9:45 AM Medical Record Number: [id number] Patient Account Number: [id number] Date of Birth/Sex: 08-31-1960 (58 y.o. F) Treating RN: Harold Barban Primary Care Provider: Webb Silversmith  Other Clinician: Referring Provider: Webb Silversmith Treating Provider/Extender: Rebecca Hake, Rahel Carlton Weeks in Treatment: 0 Verbal / Phone Orders: No Diagnosis Coding Wound Cleansing Wound #1 Right Toe - Web between 4th and 5th o Dial antibacterial soap, wash wounds, rinse and pat dry prior to dressing wounds - Don't clean wound in the shower. Primary Wound Dressing Wound #1 Right Toe - Web between 4th and 5th o Silver Alginate Secondary Dressing Wound #1 Right Toe - Web between 4th and 5th o ABD and Kerlix/Conform o  Dry Gauze Dressing Change Frequency Wound #1 Right Toe - Web between 4th and 5th o Change dressing every day. Follow-up Appointments Wound #1 Right Toe - Web between 4th and 5th o Return Appointment in 1 week. Off-Loading Wound #1 Right Toe - Web between 4th and 5th o Open toe surgical [place] to: o Other: - Elevate leg when sitting Additional Orders / Instructions o Other: - Supplies ordered through [facility], gave patient their phone number Hyperbaric Oxygen Therapy Wound #1 Right Toe - Web between 4th and 5th o Evaluate for HBO Therapy o Indication: - Wagoner grade 3 ulcer as confirmed My MRI on 11/07/18 show osteomyelitis of the 5th toe and 4th and 5th metatarsals o If appropriate for treatment, begin HBOT per protocol: o 2.0 ATA for 90 Minutes without Air Breaks o One treatment per day (delivered Monday through Friday unless otherwise specified in Special Instructions below): o Total # of Treatments: - 40 o Finger stick Blood Glucose Pre- and Post- HBOT Treatment. o Follow Hyperbaric Oxygen Glycemia Protocol Park, [name] ([id number]) Consults o Infectious Disease - [facility] o MRI, lower extremity without contrast - Right Foot, web between 4th and 5th digits. Patient Medications Allergies: No Known Drug Allergies Notifications Medication Indication Start End doxycycline hyclate 11/06/2018 DOSE 1 - oral 100 mg  capsule - 1 capsule oral taken 2 times a day for 14 days GLYCEMIA INTERVENTIONS PROTOCOL PRE-HBO GLYCEMIA INTERVENTIONS ACTION INTERVENTION Obtain pre-HBO capillary blood glucose 1 (ensure physician order is in chart). A. Notify HBO physician and await physician orders. 2 If result is 70 mg/dl or below: B. If the result meets the hospital definition of a critical result, follow hospital policy. A. Give patient an 8 ounce Glucerna Shake, an 8 ounce Ensure, or 8 ounces of a Glucerna/Ensure equivalent dietary supplement*. B. Wait 30 minutes. C. Retest patientos capillary blood If result is 71 mg/dl to 130 mg/dl: glucose (CBG). D. If result greater than or equal to 110 mg/dl, proceed with HBO. If result less than 110 mg/dl, notify HBO physician and consider holding HBO. If result is 131 mg/dl to 249 mg/dl: A. Proceed with HBO. A. Notify HBO physician and await physician orders. B. It is recommended to hold HBO and If result is 250 mg/dl or greater: do blood/urine ketone testing. C. If the result meets the hospital definition of a critical result, follow hospital policy. POST-HBO GLYCEMIA INTERVENTIONS ACTION INTERVENTION Obtain post HBO capillary blood glucose 1 (ensure physician order is in chart). A. Notify HBO physician and await physician orders. 2 If result is 70 mg/dl or below: B. If the result meets the hospital definition of a critical result, follow hospital policy. If result is 71 mg/dl to 100 mg/dl: Fullam, Shenee L. ([id number]) A. Give patient an 8 ounce Glucerna Shake, an 8 ounce Ensure, or 8 ounces of a Glucerna/Ensure equivalent dietary supplement*. B. Wait 15 minutes for symptoms of hypoglycemia (i.e. nervousness, anxiety, sweating, chills, clamminess, irritability, confusion, tachycardia or dizziness). C. If patient asymptomatic, discharge patient. If patient symptomatic, repeat capillary blood glucose (CBG) and notify HBO physician. If  result is 101 mg/dl to 249 mg/dl: A. Discharge patient. A. Notify HBO physician and await physician orders. B. It is recommended to do blood/urine If result is 250 mg/dl or greater: ketone testing. C. If the result meets the hospital definition of a critical result, follow hospital policy. *Juice or candies are NOT equivalent products. If patient refuses the Glucerna or Ensure, please consult the hospital dietitian for an  appropriate substitute. Electronic Signature(s) Unsigned Previous Signature: 11/06/2018 4:21:56 PM Version By: Harold Barban Previous Signature: 11/06/2018 5:48:01 PM Version By: Worthy Keeler PA-C Previous Signature: 11/06/2018 11:27:39 AM Version By: Worthy Keeler PA-C Entered By: Worthy Keeler on 11/07/2018 12:48:32 Signature(s): Date(s): Park, [name] ([id number]) -------------------------------------------------------------------------------- Problem List Details Patient Name: Park, [name] Date of Service: 11/06/2018 9:45 AM Medical Record Number: [id number] Patient Account Number: [id number] Date of Birth/Sex: 06/24/1960 (58 y.o. 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 M86.171 Other acute osteomyelitis, right ankle and foot 11/07/2018 No Yes I10 Essential (primary) hypertension 11/06/2018 No Yes Inactive Problems Resolved Problems Electronic Signature(s) Signed: 11/07/2018 12:00:30 PM By: Worthy Keeler PA-C Previous Signature: 11/06/2018 1:36:46 PM Version By: Worthy Keeler PA-C Previous Signature: 11/06/2018 11:25:39 AM Version By: Worthy Keeler PA-C Entered By: Worthy Keeler on 11/07/2018 12:00:30 [place], [place] ([id number]) -------------------------------------------------------------------------------- Progress Note Details Patient Name: Park, [name] Date of Service: 11/06/2018 9:45 AM Medical Record Number: [id number] Patient Account Number: [id number] Date of Birth/Sex: 1961/03/27 (58 y.o. F) Treating RN: Harold Barban Primary Care Provider: Webb Silversmith Other Clinician: Referring Provider: Webb Silversmith Treating Provider/Extender: Rebecca Hake, Jewell Ryans Weeks in Treatment: 0 Subjective Chief Complaint Information obtained from Patient Right foot ulcer History of Present Illness (HPI) 11/06/2018 upon evaluation today patient presents for initial evaluation in our clinic concerning issues that she is having with her right foot. This mainly involves the webspace between her fourth and fifth toes. With that being said this actually occurred as an injury on 10/08/2018. She was working in her garden when she inadvertently put down 1 of the tomato cages on her croc and did not realize it. When she went to walk away the [place] pulled off made her stumble and she actually hit a wood beam causing an injury/laceration to her foot. Subsequently she likely should have gone in and have this checked out immediately although about a week before she went to go see her primary care provider and according to the patient he stated she likely had needed stitches initially. Obviously that point that was not a possibility. Since that time she was placed on Keflex which she finished about a week ago that was prescribed by her primary care provider. No culture was taken at that time. Subsequently she did have an x-ray which was negative she also does have a good ABI today at 1.22 here in the office. She does have hypertension as well as diabetes her most recent hemoglobin A1c was 6.8 and that was just recently she was only initially diagnosed  around 6-9 months ago with diabetes. Obviously this is excellent progress during that time. She is obviously very concerned about her foot as well at this time this is a significant injury/ulcer. Patient History Information obtained from Patient. Allergies No Known Drug Allergies Family History Heart Disease - Father, Hypertension - Father, Stroke - Father, No family history of Cancer, Diabetes, Kidney Disease, Lung Disease, Seizures, Thyroid Problems, Tuberculosis. Social History Never smoker, Marital Status - Married, Alcohol Use - Moderate, Drug Use - No History, Caffeine Use - Rarely. Medical History Cardiovascular Patient has history of Hypertension  Denies history of Angina, Arrhythmia, Congestive Heart Failure, Coronary Artery Disease, Deep Vein Thrombosis, Hypotension, Myocardial Infarction, Peripheral Arterial Disease Endocrine Patient has history of Type II Diabetes Genitourinary Denies history of End Stage Renal Disease Patient is treated with Oral Agents. Blood sugar is not tested. Review of Systems (ROS) Wamble, Rebecca Park ([id number]) Eyes Denies complaints or symptoms of Dry Eyes, Vision Changes, Glasses / Contacts. Ear/Nose/Mouth/Throat Denies complaints or symptoms of Difficult clearing ears, Sinusitis. Hematologic/Lymphatic Denies complaints or symptoms of Bleeding / Clotting Disorders, Human Immunodeficiency Virus. Respiratory Denies complaints or symptoms of Chronic or frequent coughs, Shortness of Breath. Cardiovascular Complains or has symptoms of LE edema - time to time legs swell. Denies complaints or symptoms of Chest pain. Gastrointestinal Denies complaints or symptoms of Frequent diarrhea, Nausea, Vomiting. Genitourinary Denies complaints or symptoms of Kidney failure/ Dialysis, Incontinence/dribbling. Immunological Denies complaints or symptoms of Hives, Itching. Integumentary (Skin) Complains or has symptoms of Wounds. Musculoskeletal Denies  complaints or symptoms of Muscle Pain, Muscle Weakness. Neurologic Denies complaints or symptoms of Numbness/parasthesias, Focal/Weakness. Psychiatric Denies complaints or symptoms of Anxiety, Claustrophobia. Objective Constitutional patient is hypertensive.. pulse regular and within target range for patient.Marland Kitchen respirations regular, non-labored and within target range for patient.Marland Kitchen temperature within target range for patient.. Well-nourished and well-hydrated in no acute distress. Vitals Time Taken: 10:10 AM, Height: 66 in, Source: Stated, Weight: 209 lbs, Source: Measured, BMI: 33.7, Temperature: 99.2 F, Pulse: 115 bpm, Respiratory Rate: 18 breaths/min, Blood Pressure: 153/84 mmHg. General Notes: Patient states she has not taken any of her medications today. BP 153/84 provider notified, patient instructed to follow-up with PCP if this continues. Eyes conjunctiva clear no eyelid edema noted. pupils equal round and reactive to light and accommodation. Ears, Nose, Mouth, and Throat no gross abnormality of ear auricles or external auditory canals. normal hearing noted during conversation. mucus membranes moist. Respiratory normal breathing without difficulty. clear to auscultation bilaterally. Cardiovascular regular rate and rhythm with normal S1, S2. 2+ dorsalis pedis/posterior tibialis pulses. no clubbing, cyanosis, significant edema, Gastrointestinal (GI) Watkinson, Bryanah L. ([id number]) soft, non-tender, non-distended, +BS. no ventral hernia noted. Musculoskeletal normal gait and posture. no significant deformity or arthritic changes, no loss or range of motion, no clubbing. Psychiatric this patient is able to make decisions and demonstrates good insight into disease process. Alert and Oriented x 3. pleasant and cooperative. General Notes: Upon inspection patient's wound bed actually showed a lot of necrotic tissue in the surface of the wound upon evaluation today. She actually did  tolerate debridement today without complication she really did not seem to have any pain at this time. Fortunately there is no sign of active infection at this point either. Fortunately there does not appear to be any sign of systemic infection at this time. With that being said I do feel like she has a bit of local infection noted currently. Post debridement the wound bed appears to be doing better although again she still has quite a bit a ways to go before she will be out of the woods as far as necrotic tissue is concerned. I do believe this is can end up being a little bit deeper than what we see currently even though she did have a significant debridement today as well. Integumentary (Hair, Skin) Wound #1 status is Open. Original cause of wound was Trauma. The wound is located on the Right Toe - Web between 4th and 5th. The wound measures 3.5cm length x 1.5cm width x 1.2cm depth; 4.123cm^2 area and 4.948cm^3  volume. There is Fat Layer (Subcutaneous Tissue) Exposed exposed. There is no tunneling or undermining noted. There is a large amount of serous drainage noted. The wound margin is flat and intact. There is no granulation within the wound bed. There is a large (67-100%) amount of necrotic tissue within the wound bed including Adherent Slough. General Notes: wound is macerated. Assessment Active Problems ICD-10 Type 2 diabetes mellitus with foot ulcer Non-pressure chronic ulcer of other part of right foot with necrosis of muscle Other acute osteomyelitis, right ankle and foot Essential (primary) hypertension Procedures Wound #1 Pre-procedure diagnosis of Wound #1 is a Trauma, Other located on the Right Toe - Web between 4th and 5th .Severity of Tissue Pre Debridement is: Necrosis of muscle. There was a Excisional Skin/Subcutaneous Tissue/Muscle Debridement with a total area of 5.25 sq cm performed by Rebecca III, Paticia Moster E., PA-C. With the following instrument(s): Curette, Forceps,  and Scissors to remove Viable and Non-Viable tissue/material. Material removed includes Muscle, Callus, Subcutaneous Tissue, Slough, and Skin: Epidermis after achieving pain control using Lidocaine. 1 specimen was taken by a Tissue Culture and sent to the lab per facility protocol. A time out was conducted at 11:05, prior to the start of the procedure. A Moderate amount of bleeding was controlled with Pressure. The procedure was tolerated well with a pain level of 0 throughout and a pain level of 0 following the procedure. Post Debridement Measurements: 3.5cm length x 1.5cm width x 1.2cm depth; 4.948cm^3 volume. Character of Wound/Ulcer Post Debridement is improved. Severity of Tissue Post Debridement is: Necrosis of muscle. Post procedure Diagnosis Wound #1: Same as Pre-Procedure Park, [name] ([id number]) Plan Wound Cleansing: Wound #1 Right Toe - Web between 4th and 5th: Dial antibacterial soap, wash wounds, rinse and pat dry prior to dressing wounds - Don't clean wound in the shower. Primary Wound Dressing: Wound #1 Right Toe - Web between 4th and 5th: Silver Alginate Secondary Dressing: Wound #1 Right Toe - Web between 4th and 5th: ABD and Kerlix/Conform Dry Gauze Dressing Change Frequency: Wound #1 Right Toe - Web between 4th and 5th: Change dressing every day. Follow-up Appointments: Wound #1 Right Toe - Web between 4th and 5th: Return Appointment in 1 week. Off-Loading: Wound #1 Right Toe - Web between 4th and 5th: Open toe surgical [place] to: Other: - Elevate leg when sitting Additional Orders / Instructions: Other: - Supplies ordered through [facility], gave patient their phone number Hyperbaric Oxygen Therapy: Wound #1 Right Toe - Web between 4th and 5th: Evaluate for HBO Therapy Indication: - Wagoner grade 3 ulcer as confirmed My MRI on 11/07/18 show osteomyelitis of the 5th toe and 4th and 5th metatarsals If appropriate for treatment, begin HBOT per protocol: 2.0 ATA for  90 Minutes without Air Breaks One treatment per day (delivered Monday through Friday unless otherwise specified in Special Instructions below): Total # of Treatments: - 40 Finger stick Blood Glucose Pre- and Post- HBOT Treatment. Follow Hyperbaric Oxygen Glycemia Protocol Radiology ordered were: MRI, lower extremity without contrast - Right Foot, web between 4th and 5th digits. Consults ordered were: Infectious Disease - [place] The following medication(s) was prescribed: doxycycline hyclate oral 100 mg capsule 1 1 capsule oral taken 2 times a day for 14 days starting 11/06/2018 1. I would recommend that we go ahead and initiate treatment with a silver alginate dressing just due to the drainage and maceration that I see between the webbing of the fourth and fifth digits on her right foot. I think  this will also help to fill in the space currently noted with regard to the wound location. Obviously overall I think moisture control is 1 of the big issues at this point today. 2. I am also going to suggest that we put dry gauze and a Kerlix wrap around this in order to give this something to drain into as needed. 3. I would also suggest a postop Drenning for the patient in order to help with not having to put the dressing and everything else [place] ([id number]) into her Kreuzer which obviously is not benefit very well. 4. I did go ahead and perform a culture of the deep wound area after I debrided and this was at the base of the wound not on the surface. 5. I am also going to go ahead and order an MRI of the right foot in order to evaluate for abscess, septic arthritis, or osteomyelitis. 6. I did go ahead and place the order for the patient to have doxycycline for the next 14 days we will make any adjustments as necessary based on the culture results. We will see patient back for reevaluation in 1 week here in the clinic. If anything worsens or changes patient will contact our office for  additional recommendations. Addendum: I did review patient's MRI today which was performed earlier this morning. Does show that she has evidence of acute osteomyelitis at the fifth toe proximal phalanx. Subsequently there is also marrow edema which is present in the distal portion of the fifth toe as well as the fourth and fifth metatarsal heads. This is potential for another site of osteomyelitis although osteitis is at minimum suspected. There was also diffuse myositis. Nonetheless this does represent confirmation of a [place] Mall grade 3 ulcer in regard to the patient's wound that we are helping to take care of at this point. I do think it would be appropriate for [place] to see about initiation of hyperbaric oxygen therapy along with potentially IV antibiotics I am making a referral to infectious disease today and subsequently also recommending the HBO therapy obviously in order to try to prevent amputation.. Again I did obtain a deep wound culture following significant debridement of the wound today and we will see what this shows as well. In the meantime she is on the doxycycline which have already started her on. Electronic Signature(s) Signed: 11/07/2018 12:48:43 PM By: Worthy Keeler PA-C Previous Signature: 11/07/2018 12:47:58 PM Version By: Worthy Keeler PA-C Previous Signature: 11/07/2018 12:44:35 PM Version By: Worthy Keeler PA-C Previous Signature: 11/06/2018 1:43:33 PM Version By: Worthy Keeler PA-C Previous Signature: 11/06/2018 1:41:55 PM Version By: Worthy Keeler PA-C Entered By: Worthy Keeler on 11/07/2018 12:48:43 [place], [place] ([id number]) -------------------------------------------------------------------------------- ROS/PFSH Details Patient Name: Halling, [name] Date of Service: 11/06/2018 9:45 AM Medical Record Number: [id number] Patient Account Number: [id number] Date of Birth/Sex: 08/05/1960 (58 y.o. F) Treating RN: Cornell Barman Primary Care Provider: Webb Silversmith Other  Clinician: Referring Provider: Webb Silversmith Treating Provider/Extender: Rebecca Hake, Mireille Lacombe Weeks in Treatment: 0 Information Obtained From Patient Eyes Complaints and Symptoms: Negative for: Dry Eyes; Vision Changes; Glasses / Contacts Ear/Nose/Mouth/Throat Complaints and Symptoms: Negative for: Difficult clearing ears; Sinusitis Hematologic/Lymphatic Complaints and Symptoms: Negative for: Bleeding / Clotting Disorders; Human Immunodeficiency Virus Respiratory Complaints and Symptoms: Negative for: Chronic or frequent coughs; Shortness of Breath Cardiovascular Complaints and Symptoms: Positive for: LE edema - time to time legs swell Negative for: Chest pain Medical History: Positive for: Hypertension Negative  for: Angina; Arrhythmia; Congestive Heart Failure; Coronary Artery Disease; Deep Vein Thrombosis; Hypotension; Myocardial Infarction; Peripheral Arterial Disease Gastrointestinal Complaints and Symptoms: Negative for: Frequent diarrhea; Nausea; Vomiting Genitourinary Complaints and Symptoms: Negative for: Kidney failure/ Dialysis; Incontinence/dribbling Medical History: Negative for: End Stage Renal Disease Immunological Mcneil, Lesha L. ([id number]) Complaints and Symptoms: Negative for: Hives; Itching Integumentary (Skin) Complaints and Symptoms: Positive for: Wounds Musculoskeletal Complaints and Symptoms: Negative for: Muscle Pain; Muscle Weakness Neurologic Complaints and Symptoms: Negative for: Numbness/parasthesias; Focal/Weakness Psychiatric Complaints and Symptoms: Negative for: Anxiety; Claustrophobia Endocrine Medical History: Positive for: Type II Diabetes Time with diabetes: 9 months Treated with: Oral agents Blood sugar tested every day: No Immunizations Pneumococcal Vaccine: Received Pneumococcal Vaccination: Yes Tetanus Vaccine: Last tetanus shot: 03/29/2018 Implantable Devices None Family and Social History Cancer: No; Diabetes: No;  Heart Disease: Yes - Father; Hypertension: Yes - Father; Kidney Disease: No; Lung Disease: No; Seizures: No; Stroke: Yes - Father; Thyroid Problems: No; Tuberculosis: No; Never smoker; Marital Status - Married; Alcohol Use: Moderate; Drug Use: No History; Caffeine Use: Rarely Electronic Signature(s) Signed: 11/06/2018 5:04:06 PM By: Gretta Cool, BSN, RN, CWS, Kim RN, BSN Signed: 11/06/2018 5:48:01 PM By: Worthy Keeler PA-C Entered By: Gretta Cool, BSN, RN, CWS, Kim on 11/06/2018 10:22:32 Gad, Kimia Carlean Jews ([id number]) -------------------------------------------------------------------------------- SuperBill Details Patient Name: Goldinger, Alyxis L. Date of Service: 11/06/2018 Medical Record Number: [id number] Patient Account Number: [id number] Date of Birth/Sex: 21-Feb-1961 (58 y.o. F) Treating RN: Harold Barban Primary Care Provider: Webb Silversmith Other Clinician: Referring Provider: Webb Silversmith Treating Provider/Extender: Rebecca Hake, Jumar Greenstreet Weeks in Treatment: 0 Diagnosis Coding ICD-10 Codes Code Description E11.621 Type 2 diabetes mellitus with foot ulcer L97.513 Non-pressure chronic ulcer of other part of right foot with necrosis of muscle I10 Essential (primary) hypertension Facility Procedures CPT4 Code Description: 37858850 99213 - WOUND CARE VISIT-LEV 3 EST PT Modifier: Quantity: 1 CPT4 Code Description: [id number] 11043 - DEB MUSC/FASCIA 20 SQ CM/< ICD-10 Diagnosis Description L97.513 Non-pressure chronic ulcer of other part of right foot with necr Modifier: osis of muscle Quantity: 1 Physician Procedures CPT4 Code Description: [id number] 94709 - WC PHYS LEVEL 4 - NEW PT ICD-10 Diagnosis Description E11.621 Type 2 diabetes mellitus with foot ulcer L97.513 Non-pressure chronic ulcer of other part of right foot with necro I10 Essential (primary) hypertension Modifier: 25 sis of muscle Quantity: 1 CPT4 Code Description: [id number] 94765 - WC PHYS DEBR MUSCLE/FASCIA 20 SQ CM ICD-10 Diagnosis Description  L97.513 Non-pressure chronic ulcer of other part of right foot with necro Modifier: sis of muscle Quantity: 1 Electronic Signature(s) Signed: 11/06/2018 1:44:04 PM By: Worthy Keeler PA-C Previous Signature: 11/06/2018 1:42:19 PM Version By: Worthy Keeler PA-C Entered By: Worthy Keeler on 11/06/2018 13:44:03

## 2018-11-08 ENCOUNTER — Encounter: Payer: Self-pay | Admitting: *Deleted

## 2018-11-08 ENCOUNTER — Inpatient Hospital Stay
Admission: EM | Admit: 2018-11-08 | Discharge: 2018-11-12 | DRG: 617 | Disposition: A | Discharge status: Home Health | Payer: BC Managed Care – PPO | Attending: Internal Medicine | Admitting: Internal Medicine

## 2018-11-08 ENCOUNTER — Other Ambulatory Visit: Payer: Self-pay

## 2018-11-08 ENCOUNTER — Encounter: Payer: Self-pay | Admitting: Internal Medicine

## 2018-11-08 DIAGNOSIS — M869 Osteomyelitis, unspecified: Secondary | ICD-10-CM | POA: Diagnosis not present

## 2018-11-08 DIAGNOSIS — E11628 Type 2 diabetes mellitus with other skin complications: Secondary | ICD-10-CM | POA: Diagnosis not present

## 2018-11-08 DIAGNOSIS — Z89421 Acquired absence of other right toe(s): Secondary | ICD-10-CM | POA: Diagnosis not present

## 2018-11-08 DIAGNOSIS — M86171 Other acute osteomyelitis, right ankle and foot: Secondary | ICD-10-CM | POA: Diagnosis not present

## 2018-11-08 DIAGNOSIS — S91301A Unspecified open wound, right foot, initial encounter: Secondary | ICD-10-CM

## 2018-11-08 DIAGNOSIS — Z20828 Contact with and (suspected) exposure to other viral communicable diseases: Secondary | ICD-10-CM | POA: Diagnosis not present

## 2018-11-08 DIAGNOSIS — E876 Hypokalemia: Secondary | ICD-10-CM | POA: Diagnosis not present

## 2018-11-08 DIAGNOSIS — I10 Essential (primary) hypertension: Secondary | ICD-10-CM | POA: Diagnosis present

## 2018-11-08 DIAGNOSIS — E11621 Type 2 diabetes mellitus with foot ulcer: Secondary | ICD-10-CM | POA: Diagnosis not present

## 2018-11-08 DIAGNOSIS — Z03818 Encounter for observation for suspected exposure to other biological agents ruled out: Secondary | ICD-10-CM | POA: Diagnosis not present

## 2018-11-08 DIAGNOSIS — E785 Hyperlipidemia, unspecified: Secondary | ICD-10-CM | POA: Diagnosis not present

## 2018-11-08 DIAGNOSIS — Z7984 Long term (current) use of oral hypoglycemic drugs: Secondary | ICD-10-CM | POA: Diagnosis not present

## 2018-11-08 DIAGNOSIS — Z8249 Family history of ischemic heart disease and other diseases of the circulatory system: Secondary | ICD-10-CM

## 2018-11-08 DIAGNOSIS — E1169 Type 2 diabetes mellitus with other specified complication: Secondary | ICD-10-CM | POA: Diagnosis not present

## 2018-11-08 DIAGNOSIS — E1142 Type 2 diabetes mellitus with diabetic polyneuropathy: Secondary | ICD-10-CM | POA: Diagnosis not present

## 2018-11-08 DIAGNOSIS — M79671 Pain in right foot: Secondary | ICD-10-CM | POA: Diagnosis not present

## 2018-11-08 DIAGNOSIS — L03115 Cellulitis of right lower limb: Secondary | ICD-10-CM | POA: Diagnosis present

## 2018-11-08 DIAGNOSIS — Z9889 Other specified postprocedural states: Secondary | ICD-10-CM | POA: Diagnosis not present

## 2018-11-08 DIAGNOSIS — L97519 Non-pressure chronic ulcer of other part of right foot with unspecified severity: Secondary | ICD-10-CM | POA: Diagnosis present

## 2018-11-08 DIAGNOSIS — L03031 Cellulitis of right toe: Secondary | ICD-10-CM | POA: Diagnosis not present

## 2018-11-08 DIAGNOSIS — R000 Tachycardia, unspecified: Secondary | ICD-10-CM | POA: Diagnosis not present

## 2018-11-08 DIAGNOSIS — L97514 Non-pressure chronic ulcer of other part of right foot with necrosis of bone: Secondary | ICD-10-CM | POA: Diagnosis not present

## 2018-11-08 DIAGNOSIS — M009 Pyogenic arthritis, unspecified: Secondary | ICD-10-CM | POA: Diagnosis not present

## 2018-11-08 DIAGNOSIS — Z09 Encounter for follow-up examination after completed treatment for conditions other than malignant neoplasm: Secondary | ICD-10-CM

## 2018-11-08 DIAGNOSIS — E119 Type 2 diabetes mellitus without complications: Secondary | ICD-10-CM | POA: Diagnosis not present

## 2018-11-08 DIAGNOSIS — R110 Nausea: Secondary | ICD-10-CM | POA: Diagnosis not present

## 2018-11-08 DIAGNOSIS — Z4781 Encounter for orthopedic aftercare following surgical amputation: Secondary | ICD-10-CM | POA: Diagnosis not present

## 2018-11-08 LAB — CBC
HCT: 34.3 % — ABNORMAL LOW (ref 36.0–46.0)
Hemoglobin: 11.7 g/dL — ABNORMAL LOW (ref 12.0–15.0)
MCH: 29.3 pg (ref 26.0–34.0)
MCHC: 34.1 g/dL (ref 30.0–36.0)
MCV: 86 fL (ref 80.0–100.0)
Platelets: 249 10*3/uL (ref 150–400)
RBC: 3.99 MIL/uL (ref 3.87–5.11)
RDW: 12.5 % (ref 11.5–15.5)
WBC: 14.5 10*3/uL — ABNORMAL HIGH (ref 4.0–10.5)
nRBC: 0 % (ref 0.0–0.2)

## 2018-11-08 LAB — URINALYSIS, COMPLETE (UACMP) WITH MICROSCOPIC
Bacteria, UA: NONE SEEN
Bilirubin Urine: NEGATIVE
Glucose, UA: NEGATIVE mg/dL
Hgb urine dipstick: NEGATIVE
Ketones, ur: NEGATIVE mg/dL
Nitrite: NEGATIVE
Protein, ur: NEGATIVE mg/dL
Specific Gravity, Urine: 1.009 (ref 1.005–1.030)
pH: 5 (ref 5.0–8.0)

## 2018-11-08 LAB — BASIC METABOLIC PANEL
Anion gap: 15 (ref 5–15)
BUN: 29 mg/dL — ABNORMAL HIGH (ref 6–20)
CO2: 25 mmol/L (ref 22–32)
Calcium: 9.4 mg/dL (ref 8.9–10.3)
Chloride: 89 mmol/L — ABNORMAL LOW (ref 98–111)
Creatinine, Ser: 1.09 mg/dL — ABNORMAL HIGH (ref 0.44–1.00)
GFR calc Af Amer: >60 mL/min (ref >60)
GFR calc non Af Amer: 56 mL/min — ABNORMAL LOW (ref >60)
Glucose, Bld: 217 mg/dL — ABNORMAL HIGH (ref 70–99)
Potassium: 2.9 mmol/L — ABNORMAL LOW (ref 3.5–5.1)
Sodium: 129 mmol/L — ABNORMAL LOW (ref 135–145)

## 2018-11-08 LAB — TROPONIN I (HIGH SENSITIVITY)
Troponin I (High Sensitivity): 8 ng/L (ref <18)
Troponin I (High Sensitivity): 8 ng/L (ref <18)

## 2018-11-08 LAB — SARS CORONAVIRUS 2 BY RT PCR (HOSPITAL ORDER, PERFORMED IN ~~LOC~~ HOSPITAL LAB): SARS Coronavirus 2: NEGATIVE

## 2018-11-08 LAB — LACTIC ACID, PLASMA: Lactic Acid, Venous: 1.2 mmol/L (ref 0.5–1.9)

## 2018-11-08 MED ORDER — SODIUM CHLORIDE 0.9 % IV SOLN
2.0000 g | INTRAVENOUS | Status: DC
  Administered 2018-11-08: 2 g via INTRAVENOUS
  Filled 2018-11-08: qty 20

## 2018-11-08 MED ORDER — SODIUM CHLORIDE 0.9 % IV BOLUS
1000.0000 mL | Freq: Once | INTRAVENOUS | Status: AC
  Administered 2018-11-08: 1000 mL via INTRAVENOUS

## 2018-11-08 MED ORDER — POTASSIUM CHLORIDE 10 MEQ/100ML IV SOLN
10.0000 meq | Freq: Once | INTRAVENOUS | Status: AC
  Administered 2018-11-08: 10 meq via INTRAVENOUS
  Filled 2018-11-08: qty 100

## 2018-11-08 MED ORDER — SODIUM CHLORIDE 0.9% FLUSH
3.0000 mL | Freq: Once | INTRAVENOUS | Status: AC
  Administered 2018-11-08: 3 mL via INTRAVENOUS

## 2018-11-08 MED ORDER — METRONIDAZOLE IN NACL 5-0.79 MG/ML-% IV SOLN
500.0000 mg | Freq: Three times a day (TID) | INTRAVENOUS | Status: DC
  Administered 2018-11-08: 500 mg via INTRAVENOUS
  Filled 2018-11-08 (×3): qty 100

## 2018-11-08 NOTE — ED Triage Notes (Signed)
Pt ambulatory to triage.  Pt has a wound to bottom of right foot. Had mri of foot yesterday.  Treated at wound clinic   Diabetic.  Pt reports feeling weak and tired today with nausea.  Pt alert  Speech clear.

## 2018-11-08 NOTE — ED Notes (Signed)
Pt states that she injured bottom of R foot x 1 month, just started seeing wound clinic on Monday. Type 2 DM, takes glipizide. C/o x 2 weeks of nausea and feeling weak. Denies vomiting. Denies fever. A&O, speaking in complete sentences. No distress noted at this time.

## 2018-11-08 NOTE — ED Provider Notes (Signed)
[facility] Emergency Department Provider Note ____________________________________________   First MD Initiated Contact with Patient 11/08/18 1740     (approximate)  I have reviewed the triage vital signs and the nursing notes.   HISTORY  Chief Complaint Nausea and Weakness    HPI Rebecca Park is a 58 y.o. female with a history of diabetes and other PMH as noted below who presents with nausea and weakness over the last few weeks, gradual onset, associated with some lightheadedness intermittently.  The patient reports some chills but no known fever.  She has recently been treated for a right lower extremity wound and had an MRI performed yesterday.  She was told by her doctor to come to the hospital for evaluation.  Past Medical History:  Diagnosis Date   Diabetes mellitus without complication ([place])    Hypertension     Patient Active Problem List   Diagnosis Date Noted   Osteomyelitis ([place]) 11/08/2018   Vitamin D deficiency 09/17/2018   HLD (hyperlipidemia) 09/17/2018   Diabetes mellitus ([place]) 03/27/2018   White coat syndrome with diagnosis of hypertension 03/27/2018    Past Surgical History:  Procedure Laterality Date   BUNIONECTOMY Bilateral    COLONOSCOPY WITH PROPOFOL N/A 09/07/2018   Procedure: COLONOSCOPY WITH PROPOFOL;  Surgeon: Lin Landsman, MD;  Location: [facility];  Service: Gastroenterology;  Laterality: N/A;    Prior to Admission medications   Medication Sig Start Date End Date Taking? Authorizing Provider  amLODipine (NORVASC) 5 MG tablet TAKE 1 TABLET BY MOUTH EVERY DAY 08/25/18   Jearld Fenton, NP  atorvastatin (LIPITOR) 20 MG tablet Take 1 tablet (20 mg total) by mouth daily. 09/17/18   Jearld Fenton, NP  glipiZIDE (GLUCOTROL) 10 MG tablet Take 1 tablet (10 mg total) by mouth daily before breakfast. 06/08/18   Jearld Fenton, NP  hydrochlorothiazide (HYDRODIURIL) 25 MG tablet Take 1 tablet (25 mg total) by  mouth daily. 06/06/18   Jearld Fenton, NP  losartan (COZAAR) 50 MG tablet TAKE 2 TABLETS BY MOUTH EVERY DAY 10/30/18   Jearld Fenton, NP  Multiple Vitamins-Minerals (WOMENS MULTIVITAMIN PO) Take by mouth.    [provider]  Vitamin D, Ergocalciferol, (DRISDOL) 1.25 MG (50000 UT) CAPS capsule Take 1 capsule (50,000 Units total) by mouth every 7 (seven) days. 09/17/18   Jearld Fenton, NP    Allergies Patient has no known allergies.  Family History  Problem Relation Age of Onset   Heart disease Father    Heart attack Father    Heart disease Maternal Grandfather     Social History Social History   Tobacco Use   Smoking status: Never Smoker   Smokeless tobacco: Never Used  Substance Use Topics   Alcohol use: Yes    Comment: recently quit 01/2018, 3-4 beers QD   Drug use: Never    Review of Systems  Constitutional: Positive for chills. Eyes: No redness. ENT: No sore throat. Cardiovascular: Denies chest pain. Respiratory: Denies shortness of breath. Gastrointestinal: Positive for nausea.  No vomiting or diarrhea.  Genitourinary: Negative for dysuria.  Musculoskeletal: Negative for back pain. Skin: Negative for rash. Neurological: Negative for headache.   ____________________________________________   PHYSICAL EXAM:  VITAL SIGNS: ED Triage Vitals  Enc Vitals Group     BP 11/08/18 1600 (!) 141/83     Pulse Rate 11/08/18 1600 (!) 112     Resp 11/08/18 1600 20     Temp 11/08/18 1600 99.3 F (  37.4 C)     Temp Source 11/08/18 1600 Oral     SpO2 11/08/18 1600 100 %     Weight --      Height --      Head Circumference --      Peak Flow --      Pain Score 11/08/18 1557 0     Pain Loc --      Pain Edu? --      Excl. in [place]? --     Constitutional: Alert and oriented.  Relatively well appearing and in no acute distress. Eyes: Conjunctivae are normal.  Head: Atraumatic. Nose: No congestion/rhinnorhea. Mouth/Throat: Mucous membranes are moist.     Neck: Normal range of motion.  Cardiovascular: Borderline tachycardic, regular rhythm. Grossly normal heart sounds.  Good peripheral circulation. Respiratory: Normal respiratory effort.  No retractions.  Gastrointestinal: No distention.  Musculoskeletal: No lower extremity edema.  Extremities warm and well perfused.  Right fifth toe with erythema and swelling, with surrounding 2 to 3 cm of erythema and induration to the distal foot.  There is faint streaking up the dorsum of the foot.  Full range of motion at the ankle. Neurologic:  Normal speech and language. No gross focal neurologic deficits are appreciated.  Skin:  Skin is warm and dry. No rash noted. Psychiatric: Mood and affect are normal. Speech and behavior are normal.  ____________________________________________   LABS (all labs ordered are listed, but only abnormal results are displayed)  Labs Reviewed  BASIC METABOLIC PANEL - Abnormal; Notable for the following components:      Result Value   Sodium 129 (*)    Potassium 2.9 (*)    Chloride 89 (*)    Glucose, Bld 217 (*)    BUN 29 (*)    Creatinine, Ser 1.09 (*)    GFR calc non Af Amer 56 (*)    All other components within normal limits  CBC - Abnormal; Notable for the following components:   WBC 14.5 (*)    Hemoglobin 11.7 (*)    HCT 34.3 (*)    All other components within normal limits  URINALYSIS, COMPLETE (UACMP) WITH MICROSCOPIC - Abnormal; Notable for the following components:   Color, Urine YELLOW (*)    APPearance HAZY (*)    Leukocytes,Ua SMALL (*)    All other components within normal limits  SARS CORONAVIRUS 2 (HOSPITAL ORDER, [facility])  CULTURE, BLOOD (ROUTINE X 2)  CULTURE, BLOOD (ROUTINE X 2)  LACTIC ACID, PLASMA  LACTIC ACID, PLASMA  TROPONIN I (HIGH SENSITIVITY)  TROPONIN I (HIGH SENSITIVITY)    ____________________________________________  EKG   ____________________________________________  RADIOLOGY    ____________________________________________   PROCEDURES  Procedure(s) performed: No  Procedures  Critical Care performed: No ____________________________________________   INITIAL IMPRESSION / ASSESSMENT AND PLAN / ED COURSE  Pertinent labs & imaging results that were available during my care of the patient were reviewed by me and considered in my medical decision making (see chart for details).  58 year old female with a history of diabetes and other PMH as noted above and recently being treated for a right lower extremity wound infection presents with nausea and fatigue over the last few weeks, which have worsened recently.  She has also had some chills.  She states that the right leg wound has not improved despite being on antibiotics.  I reviewed the past medical records in epic.  The patient was most recently put on Keflex on 10/16/2018.  She had an MRI performed yesterday which demonstrates the following findings:   1. Findings highly suspicious for osteomyelitis of the fifth toe proximal phalanx. 2. Marrow edema is also present within the fifth toe distal phalanx and plantar surfaces of the fourth and fifth metatarsal heads without corresponding T1 signal abnormality to suggest acute osteomyelitis. Findings may reflect reactive osteitis secondary to associated soft tissue infection. 3. Findings suggestive of cellulitis centered at the fourth and fifth digits with focal area of hypoenhancement within the dorsal soft tissues at the level of the fourth MTP joint concerning for an area of soft tissue necrosis. No well-defined or drainable fluid collections. 4. Diffuse myositis.  On exam the patient is overall relatively well-appearing.  She has a low-grade temperature and is slightly tachycardic with otherwise normal vital signs.  There is erythema and  swelling of the right great toe and the distal foot surrounding the base of the toe.  Overall presentation is consistent with osteomyelitis versus or in combination with cellulitis.  Given that the patient has already taken oral antibiotics without improvement, her abnormal vital signs, elevated WBC count, and the possibility of osteomyelitis, she will need admission for IV antibiotics.  Initial lab work also reveals hyponatremia and hypokalemia.  I will order fluids, potassium repletion, IV antibiotics, blood cultures, lactic acid, COVID-19 swab, and plan for admission.  ----------------------------------------- 9:52 PM on 11/08/2018 -----------------------------------------  Patient is negative for COVID-19.  Lactic acid is normal.  Her vital signs have improved.  I signed the patient out to the hospitalist service and discussed her case with NP Seals.  _______________________________  Casandra Doffing was evaluated in Emergency Department on 11/08/2018 for the symptoms described in the history of present illness. She was evaluated in the context of the global COVID-19 pandemic, which necessitated consideration that the patient might be at risk for infection with the SARS-CoV-2 virus that causes COVID-19. Institutional protocols and algorithms that pertain to the evaluation of patients at risk for COVID-19 are in a state of rapid change based on information released by regulatory bodies including the CDC and federal and state organizations. These policies and algorithms were followed during the patient's care in the ED. ____________________________________________   FINAL CLINICAL IMPRESSION(S) / ED DIAGNOSES  Final diagnoses:  Osteomyelitis of right foot, unspecified type ([place])      NEW MEDICATIONS STARTED DURING THIS VISIT:  New Prescriptions   No medications on file     Note:  This document was prepared using Dragon voice recognition software and may include unintentional  dictation errors.    Arta Silence, MD 11/08/18 2152

## 2018-11-08 NOTE — ED Notes (Addendum)
ED TO INPATIENT HANDOFF REPORT  ED Nurse Name and Phone #:  Gershon Mussel RN   [phone]  S Name/Age/Gender Rebecca Park 58 y.o. female Room/Bed: ED04A/ED04A  Code Status   Code Status: Not on file  Home/SNF/Other Home Patient oriented to: self, place, time and situation Is this baseline? Yes   Triage Complete: Triage complete  Chief Complaint poss bladder infection  Triage Note Pt ambulatory to triage.  Pt has a wound to bottom of right foot. Had mri of foot yesterday.  Treated at wound clinic   Diabetic.  Pt reports feeling weak and tired today with nausea.  Pt alert  Speech clear.     Allergies No Known Allergies  Level of Care/Admitting Diagnosis ED Disposition    ED Disposition Condition [facility] Area: Halfway [[id number]]  Level of Care: Med-Surg [16]  Covid Evaluation: Confirmed COVID Negative  Diagnosis: Osteomyelitis [facility]) [[id number]]  Admitting Physician: Mayer Camel [[id number]]  Attending Physician: Mayer Camel [[id number]]  Estimated length of stay: past midnight tomorrow  Certification:: I certify this patient will need inpatient services for at least 2 midnights  PT Class (Do Not Modify): Inpatient [101]  PT Acc Code (Do Not Modify): Private [1]       B Medical/Surgery History Past Medical History:  Diagnosis Date  . Diabetes mellitus without complication ([place])   . Hypertension    Past Surgical History:  Procedure Laterality Date  . BUNIONECTOMY Bilateral   . COLONOSCOPY WITH PROPOFOL N/A 09/07/2018   Procedure: COLONOSCOPY WITH PROPOFOL;  Surgeon: Lin Landsman, MD;  Location: [facility] ENDOSCOPY;  Service: Gastroenterology;  Laterality: N/A;     A IV Location/Drains/Wounds Patient Lines/Drains/Airways Status   Active Line/Drains/Airways    Name:   Placement date:   Placement time:   Site:   Days:   Peripheral IV 11/08/18 Right Forearm   11/08/18    1846    Forearm   less than 1   Peripheral IV 11/08/18 Left  Antecubital   11/08/18    1851    Antecubital   less than 1          Intake/Output Last 24 hours  Intake/Output Summary (Last 24 hours) at 11/08/2018 2317 Last data filed at 11/08/2018 2148 Gross per 24 hour  Intake 1290.92 ml  Output -  Net 1290.92 ml    Labs/Imaging Results for orders placed or performed during the hospital encounter of 11/08/18 (from the past 48 hour(s))  Basic metabolic panel     Status: Abnormal   Collection Time: 11/08/18  4:01 PM  Result Value Ref Range   Sodium 129 (L) 135 - 145 mmol/L   Potassium 2.9 (L) 3.5 - 5.1 mmol/L   Chloride 89 (L) 98 - 111 mmol/L   CO2 25 22 - 32 mmol/L   Glucose, Bld 217 (H) 70 - 99 mg/dL   BUN 29 (H) 6 - 20 mg/dL   Creatinine, Ser 1.09 (H) 0.44 - 1.00 mg/dL   Calcium 9.4 8.9 - 10.3 mg/dL   GFR calc non Af Amer 56 (L) >60 mL/min   GFR calc Af Amer >60 >60 mL/min   Anion gap 15 5 - 15    Comment: Performed at [facility], [address] [place], [place] [address]  CBC     Status: Abnormal   Collection Time: 11/08/18  4:01 PM  Result Value Ref Range   WBC 14.5 (H) 4.0 - 10.5 K/uL  RBC 3.99 3.87 - 5.11 MIL/uL   Hemoglobin 11.7 (L) 12.0 - 15.0 g/dL   HCT 34.3 (L) 36.0 - 46.0 %   MCV 86.0 80.0 - 100.0 fL   MCH 29.3 26.0 - 34.0 pg   MCHC 34.1 30.0 - 36.0 g/dL   RDW 12.5 11.5 - 15.5 %   Platelets 249 150 - 400 K/uL   nRBC 0.0 0.0 - 0.2 %    Comment: Performed at [facility], [address] [place], [place] [address]  Urinalysis, Complete w Microscopic     Status: Abnormal   Collection Time: 11/08/18  4:01 PM  Result Value Ref Range   Color, Urine YELLOW (A) YELLOW   APPearance HAZY (A) CLEAR   Specific Gravity, Urine 1.009 1.005 - 1.030   pH 5.0 5.0 - 8.0   Glucose, UA NEGATIVE NEGATIVE mg/dL   Hgb urine dipstick NEGATIVE NEGATIVE   Bilirubin Urine NEGATIVE NEGATIVE   Ketones, ur NEGATIVE NEGATIVE mg/dL   Protein, ur NEGATIVE NEGATIVE mg/dL   Nitrite NEGATIVE NEGATIVE   Leukocytes,Ua SMALL  (A) NEGATIVE   RBC / HPF 0-5 0 - 5 RBC/hpf   WBC, UA 11-20 0 - 5 WBC/hpf   Bacteria, UA NONE SEEN NONE SEEN   Squamous Epithelial / LPF 6-10 0 - 5   Mucus PRESENT    Hyaline Casts, UA PRESENT     Comment: Performed at [facility], [place], [place] [address]  Troponin I (High Sensitivity)     Status: None   Collection Time: 11/08/18  4:01 PM  Result Value Ref Range   Troponin I (High Sensitivity) 8 <18 ng/L    Comment: (NOTE) Elevated high sensitivity troponin I (hsTnI) values and significant  changes across serial measurements may suggest ACS but many other  chronic and acute conditions are known to elevate hsTnI results.  Refer to the "Links" section for chest pain algorithms and additional  guidance. Performed at [facility], [place], [place] [address]   Troponin I (High Sensitivity)     Status: None   Collection Time: 11/08/18  6:39 PM  Result Value Ref Range   Troponin I (High Sensitivity) 8 <18 ng/L    Comment: (NOTE) Elevated high sensitivity troponin I (hsTnI) values and significant  changes across serial measurements may suggest ACS but many other  chronic and acute conditions are known to elevate hsTnI results.  Refer to the "Links" section for chest pain algorithms and additional  guidance. Performed at [facility], [address] [place], [place] [address]   Lactic acid, plasma     Status: None   Collection Time: 11/08/18  6:39 PM  Result Value Ref Range   Lactic Acid, Venous 1.2 0.5 - 1.9 mmol/L    Comment: Performed at [facility], [address] [place], [place] [address]  SARS Coronavirus 2 [facility] order, Performed in [facility] hospital lab) Nasopharyngeal Nasopharyngeal Swab     Status: None   Collection Time: 11/08/18  6:39 PM   Specimen: Nasopharyngeal Swab  Result Value Ref Range   SARS Coronavirus 2 NEGATIVE NEGATIVE    Comment: (NOTE) If result is NEGATIVE SARS-CoV-2  target nucleic acids are NOT DETECTED. The SARS-CoV-2 RNA is generally detectable in upper and lower  respiratory specimens during the acute phase of infection. The lowest  concentration of SARS-CoV-2 viral copies this assay can detect is 250  copies / mL. A negative result does not preclude SARS-CoV-2  infection  and should not be used as the sole basis for treatment or other  patient management decisions.  A negative result may occur with  improper specimen collection / handling, submission of specimen other  than nasopharyngeal swab, presence of viral mutation(s) within the  areas targeted by this assay, and inadequate number of viral copies  (<250 copies / mL). A negative result must be combined with clinical  observations, patient history, and epidemiological information. If result is POSITIVE SARS-CoV-2 target nucleic acids are DETECTED. The SARS-CoV-2 RNA is generally detectable in upper and lower  respiratory specimens dur ing the acute phase of infection.  Positive  results are indicative of active infection with SARS-CoV-2.  Clinical  correlation with patient history and other diagnostic information is  necessary to determine patient infection status.  Positive results do  not rule out bacterial infection or co-infection with other viruses. If result is PRESUMPTIVE POSTIVE SARS-CoV-2 nucleic acids MAY BE PRESENT.   A presumptive positive result was obtained on the submitted specimen  and confirmed on repeat testing.  While 2019 novel coronavirus  (SARS-CoV-2) nucleic acids may be present in the submitted sample  additional confirmatory testing may be necessary for epidemiological  and / or clinical management purposes  to differentiate between  SARS-CoV-2 and other Sarbecovirus currently known to infect humans.  If clinically indicated additional testing with an alternate test  methodology [phone]) is advised. The SARS-CoV-2 RNA is generally  detectable in upper and lower  respiratory sp ecimens during the acute  phase of infection. The expected result is Negative. Fact Sheet for Patients:  StrictlyIdeas.no Fact Sheet for Healthcare Providers: BankingDealers.co.za This test is not yet approved or cleared by the [place] FDA and has been authorized for detection and/or diagnosis of SARS-CoV-2 by FDA under an Emergency Use Authorization (EUA).  This EUA will remain in effect (meaning this test can be used) for the duration of the COVID-19 declaration under Section 564(b)(1) of the Act, 21 U.S.C. section 360bbb-3(b)(1), unless the authorization is terminated or revoked sooner. Performed at [facility], [address] [place], [place] [address]    Mr Foot Right W Wo Contrast  Result Date: 11/07/2018 CLINICAL DATA:  Right foot pain with plantar surface laceration. EXAM: MRI OF THE RIGHT FOREFOOT WITHOUT AND WITH CONTRAST TECHNIQUE: Multiplanar, multisequence MR imaging of the right forefoot was performed before and after the administration of intravenous contrast. CONTRAST:  9 mL Gadavist intravenous contrast COMPARISON:  X-ray 10/16/2018 FINDINGS: Bones/Joint/Cartilage Confluent low T1 signal with associated T2 hyperintensity within the fifth digit proximal phalanx (series 7 & 9, images 8-11). There is marrow edema also present within the fifth digit distal phalanx base and the plantar aspect of the fifth metatarsal head without associated confluent low T1 marrow signal. Similar mild marrow edema along the plantar aspect of the fourth metatarsal head without corresponding T1 signal abnormality. No acute fracture identified. Postoperative changes at the first metatarsal from prior bunionectomy. No joint effusions. Mild first MTP degenerative changes. Ligaments Ligaments including the Lisfranc ligament are intact. Muscles and Tendons There is extensive intramuscular throughout the intrinsic foot musculature.  Tendons appear intact without tenosynovitis. Soft tissues There is extensive subcutaneous edema centered at the level of the fourth and fifth digits. Focal area of non enhancement within the dorsal soft tissues dorsal to the fourth MTP joint may represent an area of soft tissue necrosis (series 12, image 20). No well-defined or rim enhancing fluid collections. No discrete foreign body identified by  MRI. IMPRESSION: 1. Findings highly suspicious for osteomyelitis of the fifth toe proximal phalanx. 2. Marrow edema is also present within the fifth toe distal phalanx and plantar surfaces of the fourth and fifth metatarsal heads without corresponding T1 signal abnormality to suggest acute osteomyelitis. Findings may reflect reactive osteitis secondary to associated soft tissue infection. 3. Findings suggestive of cellulitis centered at the fourth and fifth digits with focal area of hypoenhancement within the dorsal soft tissues at the level of the fourth MTP joint concerning for an area of soft tissue necrosis. No well-defined or drainable fluid collections. 4. Diffuse myositis. These results will be called to the ordering clinician or representative by the Radiologist Assistant, and communication documented in the PACS or zVision Dashboard. Electronically Signed   By: Davina Poke M.D.   On: 11/07/2018 10:50    Pending Labs Unresulted Labs (From admission, onward)    Start     Ordered   11/08/18 1832  Lactic acid, plasma  Now then every 2 hours,   STAT     11/08/18 1831   11/08/18 1832  Culture, blood (routine x 2)  BLOOD CULTURE X 2,   STAT     11/08/18 1831   Signed and Held  Hemoglobin A1c  Once,   R    Comments: To assess prior glycemic control    Signed and Held   Signed and Held  CBC  (enoxaparin (LOVENOX)    CrCl >/= 30 ml/min)  Once,   R    Comments: Baseline for enoxaparin therapy IF NOT ALREADY DRAWN.  Notify MD if PLT < 100 K.    Signed and Held   Signed and Held  Creatinine, serum   (enoxaparin (LOVENOX)    CrCl >/= 30 ml/min)  Once,   R    Comments: Baseline for enoxaparin therapy IF NOT ALREADY DRAWN.    Signed and Held   Signed and Held  Creatinine, serum  (enoxaparin (LOVENOX)    CrCl >/= 30 ml/min)  Weekly,   R    Comments: while on enoxaparin therapy    Signed and Held   Signed and Held  Hemoglobin A1c  Once,   R     Signed and Held   Signed and Held  Basic metabolic panel  Tomorrow morning,   R     Signed and Held   Signed and Held  CBC  Tomorrow morning,   R     Signed and Held   Signed and Held  Aerobic Culture (superficial specimen)  Once,   R    Question:  Patient immune status  Answer:  Normal   Signed and Held          Vitals/Pain Today's Vitals   11/08/18 2000 11/08/18 2130 11/08/18 2230 11/08/18 2300  BP: 124/72 106/71 117/70 127/77  Pulse: (!) 104 (!) 102 100 98  Resp: 18 12 17 20   Temp:      TempSrc:      SpO2: 100% 98% 96% 97%  PainSc:        Isolation Precautions No active isolations  Medications Medications  cefTRIAXone (ROCEPHIN) 2 g in sodium chloride 0.9 % 100 mL IVPB (0 g Intravenous Stopped 11/08/18 2115)    And  metroNIDAZOLE (FLAGYL) IVPB 500 mg (0 mg Intravenous Stopped 11/08/18 2027)  sodium chloride flush (NS) 0.9 % injection 3 mL (3 mLs Intravenous Given 11/08/18 1941)  sodium chloride 0.9 % bolus 1,000 mL (1,000 mLs Intravenous New Bag/Given 11/08/18 2157)  potassium chloride 10 mEq in 100 mL IVPB (0 mEq Intravenous Stopped 11/08/18 2024)  sodium chloride 0.9 % bolus 1,000 mL (0 mLs Intravenous Stopped 11/08/18 2148)    Mobility walks with device Low fall risk   Focused Assessments Cardiac Assessment Handoff:    No results found for: CKTOTAL, CKMB, CKMBINDEX, TROPONINI No results found for: DDIMER Does the Patient currently have chest pain? No      R Recommendations: See Admitting Provider Note  Report given to:  Lexine Baton RN on 1A  Additional Notes:

## 2018-11-08 NOTE — ED Notes (Signed)
Unable to void at this time.

## 2018-11-08 NOTE — H&P (Signed)
[place] [facility] [place] NAME: Rebecca Park    MR#:  [id number]  DATE OF BIRTH:  [birth year]  DATE OF ADMISSION:  11/08/2018  PRIMARY CARE PHYSICIAN: Jearld Fenton, NP   REQUESTING/REFERRING PHYSICIAN: Noralee Stain, MD  CHIEF COMPLAINT:   Chief Complaint  Patient presents with  . Nausea  . Weakness    HISTORY OF PRESENT ILLNESS:  Rebecca Park  is a 58 y.o. female with a known history of diabetes mellitus and hypertension, hyperlipidemia.  She presented to the emergency room complaining of nausea with weakness over the last 3 to 4 weeks which has been gradual in onset with some intermittent lightheadedness.  She has also noted occasional chills however no fever.  She injured her right fifth toe about 1 month ago while she was in her garden.  She completed a course of oral Keflex about 10 days ago with no improvement in appearance of right fifth toe with continued erythema, edema, and drainage as well as increased pain.  She was seen by the wound clinic on Monday with MRI completed yesterday demonstrating findings highly suspicious for osteomyelitis of the fifth toe with marrow edema within the fifth toe distal phalanx and plantar surfaces of the fourth and fifth metatarsal heads suggesting acute osteomyelitis.  No well-defined or drainable fluid collections.  Diffuse myositis.  WBC is 14.5 on arrival.  We have admitted her to the hospitalist for further management.  PAST MEDICAL HISTORY:   Past Medical History:  Diagnosis Date  . Diabetes mellitus without complication ([place])   . Hypertension     PAST SURGICAL HISTORY:   Past Surgical History:  Procedure Laterality Date  . BUNIONECTOMY Bilateral   . COLONOSCOPY WITH PROPOFOL N/A 09/07/2018   Procedure: COLONOSCOPY WITH PROPOFOL;  Surgeon: Lin Landsman, MD;  Location[facility] ENDOSCOPY;  Service: Gastroenterology;  Laterality: N/A;    SOCIAL HISTORY:   Social History    Tobacco Use  . Smoking status: Never Smoker  . Smokeless tobacco: Never Used  Substance Use Topics  . Alcohol use: Yes    Comment: recently quit 01/2018, 3-4 beers QD    FAMILY HISTORY:   Family History  Problem Relation Age of Onset  . Heart disease Father   . Heart attack Father   . Heart disease Maternal Grandfather     DRUG ALLERGIES:  No Known Allergies  REVIEW OF SYSTEMS:   Review of Systems  Constitutional: Positive for malaise/fatigue. Negative for chills and fever.  HENT: Negative for congestion, sinus pain and sore throat.   Respiratory: Negative for sputum production, shortness of breath, wheezing and stridor.   Cardiovascular: Negative for chest pain and palpitations.  Gastrointestinal: Positive for nausea. Negative for abdominal pain, constipation, diarrhea and vomiting.  Genitourinary: Negative for dysuria and hematuria.  Musculoskeletal: Negative for back pain, falls, myalgias and neck pain.  Skin: Negative for itching and rash.       Right small toe infection  Neurological: Negative for dizziness and headaches.  Psychiatric/Behavioral: Negative for depression.    MEDICATIONS AT HOME:   Prior to Admission medications   Medication Sig Start Date End Date Taking? Authorizing Provider  amLODipine (NORVASC) 5 MG tablet TAKE 1 TABLET BY MOUTH EVERY DAY 08/25/18   Jearld Fenton, NP  atorvastatin (LIPITOR) 20 MG tablet Take 1 tablet (20 mg total) by mouth daily. 09/17/18   Jearld Fenton, NP  glipiZIDE (GLUCOTROL) 10 MG tablet Take 1 tablet (10 mg  total) by mouth daily before breakfast. 06/08/18   Jearld Fenton, NP  hydrochlorothiazide (HYDRODIURIL) 25 MG tablet Take 1 tablet (25 mg total) by mouth daily. 06/06/18   Jearld Fenton, NP  losartan (COZAAR) 50 MG tablet TAKE 2 TABLETS BY MOUTH EVERY DAY 10/30/18   Jearld Fenton, NP  Multiple Vitamins-Minerals (WOMENS MULTIVITAMIN PO) Take by mouth.    [provider]  Vitamin D, Ergocalciferol, (DRISDOL)  1.25 MG (50000 UT) CAPS capsule Take 1 capsule (50,000 Units total) by mouth every 7 (seven) days. 09/17/18   Jearld Fenton, NP      VITAL SIGNS:  Blood pressure 124/72, pulse (!) 104, temperature 99.3 F (37.4 C), temperature source Oral, resp. rate 18, SpO2 100 %.  PHYSICAL EXAMINATION:  Physical Exam  GENERAL:  58 y.o.-year-old patient lying in the bed with no acute distress.  EYES: Pupils equal, round, reactive to light and accommodation. No scleral icterus. Extraocular muscles intact.  HEENT: Head atraumatic, normocephalic. Oropharynx and nasopharynx clear.  NECK:  Supple, no jugular venous distention. No thyroid enlargement, no tenderness.  LUNGS: Normal breath sounds bilaterally, no wheezing, rales,rhonchi or crepitation. No use of accessory muscles of respiration.  CARDIOVASCULAR: Regular rate and rhythm, S1, S2 normal. No murmurs, rubs, or gallops.  ABDOMEN: Soft, nondistended, nontender. Bowel sounds present. No organomegaly or mass.  EXTREMITIES: No pedal edema, cyanosis, or clubbing.  NEUROLOGIC: Cranial nerves II through XII are intact. Muscle strength 5/5 in all extremities. Sensation intact. Gait not checked.  PSYCHIATRIC: The patient is alert and oriented x 3.  Normal affect and good eye contact. SKIN: right 5th toe edema with anterior edema and dark discoloration of the 5th toe and area between 4th  And 5th toes  LABORATORY PANEL:   CBC Recent Labs  Lab 11/08/18 1601  WBC 14.5*  HGB 11.7*  HCT 34.3*  PLT 249   ------------------------------------------------------------------------------------------------------------------  Chemistries  Recent Labs  Lab 11/08/18 1601  NA 129*  K 2.9*  CL 89*  CO2 25  GLUCOSE 217*  BUN 29*  CREATININE 1.09*  CALCIUM 9.4   ------------------------------------------------------------------------------------------------------------------  Cardiac Enzymes No results for input(s): TROPONINI in the last 168 hours.  ------------------------------------------------------------------------------------------------------------------  RADIOLOGY:  Mr Foot Right W Wo Contrast  Result Date: 11/07/2018 CLINICAL DATA:  Right foot pain with plantar surface laceration. EXAM: MRI OF THE RIGHT FOREFOOT WITHOUT AND WITH CONTRAST TECHNIQUE: Multiplanar, multisequence MR imaging of the right forefoot was performed before and after the administration of intravenous contrast. CONTRAST:  9 mL Gadavist intravenous contrast COMPARISON:  X-ray 10/16/2018 FINDINGS: Bones/Joint/Cartilage Confluent low T1 signal with associated T2 hyperintensity within the fifth digit proximal phalanx (series 7 & 9, images 8-11). There is marrow edema also present within the fifth digit distal phalanx base and the plantar aspect of the fifth metatarsal head without associated confluent low T1 marrow signal. Similar mild marrow edema along the plantar aspect of the fourth metatarsal head without corresponding T1 signal abnormality. No acute fracture identified. Postoperative changes at the first metatarsal from prior bunionectomy. No joint effusions. Mild first MTP degenerative changes. Ligaments Ligaments including the Lisfranc ligament are intact. Muscles and Tendons There is extensive intramuscular throughout the intrinsic foot musculature. Tendons appear intact without tenosynovitis. Soft tissues There is extensive subcutaneous edema centered at the level of the fourth and fifth digits. Focal area of non enhancement within the dorsal soft tissues dorsal to the fourth MTP joint may represent an area of soft tissue necrosis (series 12, image 20).  No well-defined or rim enhancing fluid collections. No discrete foreign body identified by MRI. IMPRESSION: 1. Findings highly suspicious for osteomyelitis of the fifth toe proximal phalanx. 2. Marrow edema is also present within the fifth toe distal phalanx and plantar surfaces of the fourth and fifth metatarsal heads  without corresponding T1 signal abnormality to suggest acute osteomyelitis. Findings may reflect reactive osteitis secondary to associated soft tissue infection. 3. Findings suggestive of cellulitis centered at the fourth and fifth digits with focal area of hypoenhancement within the dorsal soft tissues at the level of the fourth MTP joint concerning for an area of soft tissue necrosis. No well-defined or drainable fluid collections. 4. Diffuse myositis. These results will be called to the ordering clinician or representative by the Radiologist Assistant, and communication documented in the PACS or zVision Dashboard. Electronically Signed   By: Davina Poke M.D.   On: 11/07/2018 10:50      IMPRESSION AND PLAN:   1.  Right fifth toe osteomyelitis - IV Rocephin and Flagyl - Wound culture obtained -Wound care nurse consulted -Podiatry consulted for further evaluation and recommendations  2. right foot pain - We will treat pain with analgesic -We will treat nausea with IV antiemetic  3.  Diabetes mellitus - Hemoglobin A1c pending - Sliding scale insulin -Glucotrol continued  4.  Hypertension - Norvasc continue -We will treat persistent hypertension expectantly with PRN medication  DVT and PPI prophylaxis    All the records are reviewed and case discussed with ED provider. The plan of care was discussed in details with the patient (and family). I answered all questions. The patient agreed to proceed with the above mentioned plan. Further management will depend upon hospital course.   CODE STATUS: Full code  TOTAL TIME TAKING CARE OF THIS PATIENT: 30minutes.    [place] on 11/08/2018 at 9:11 PM  Pager - [phone]  After 6pm go to [email or web address] - Proofreader  [employer]  Hospitalists  Office  [phone]  CC: Primary care physician; Jearld Fenton, NP   Note: This dictation was prepared with Dragon dictation along with smaller  phrase technology. Any transcriptional errors that result from this process are unintentional.

## 2018-11-09 ENCOUNTER — Inpatient Hospital Stay: Payer: BC Managed Care – PPO

## 2018-11-09 ENCOUNTER — Other Ambulatory Visit: Payer: Self-pay

## 2018-11-09 LAB — BASIC METABOLIC PANEL
Anion gap: 11 (ref 5–15)
BUN: 25 mg/dL — ABNORMAL HIGH (ref 6–20)
CO2: 26 mmol/L (ref 22–32)
Calcium: 8.6 mg/dL — ABNORMAL LOW (ref 8.9–10.3)
Chloride: 98 mmol/L (ref 98–111)
Creatinine, Ser: 0.96 mg/dL (ref 0.44–1.00)
GFR calc Af Amer: >60 mL/min (ref >60)
GFR calc non Af Amer: >60 mL/min (ref >60)
Glucose, Bld: 180 mg/dL — ABNORMAL HIGH (ref 70–99)
Potassium: 3.4 mmol/L — ABNORMAL LOW (ref 3.5–5.1)
Sodium: 135 mmol/L (ref 135–145)

## 2018-11-09 LAB — CBC
HCT: 31.4 % — ABNORMAL LOW (ref 36.0–46.0)
Hemoglobin: 10.6 g/dL — ABNORMAL LOW (ref 12.0–15.0)
MCH: 29.3 pg (ref 26.0–34.0)
MCHC: 33.8 g/dL (ref 30.0–36.0)
MCV: 86.7 fL (ref 80.0–100.0)
Platelets: 202 10*3/uL (ref 150–400)
RBC: 3.62 MIL/uL — ABNORMAL LOW (ref 3.87–5.11)
RDW: 12.5 % (ref 11.5–15.5)
WBC: 11.8 10*3/uL — ABNORMAL HIGH (ref 4.0–10.5)
nRBC: 0 % (ref 0.0–0.2)

## 2018-11-09 LAB — HEMOGLOBIN A1C
Hgb A1c MFr Bld: 6.7 % — ABNORMAL HIGH (ref 4.8–5.6)
Mean Plasma Glucose: 145.59 mg/dL

## 2018-11-09 LAB — GLUCOSE, CAPILLARY
Glucose-Capillary: 157 mg/dL — ABNORMAL HIGH (ref 70–99)
Glucose-Capillary: 162 mg/dL — ABNORMAL HIGH (ref 70–99)
Glucose-Capillary: 169 mg/dL — ABNORMAL HIGH (ref 70–99)
Glucose-Capillary: 175 mg/dL — ABNORMAL HIGH (ref 70–99)
Glucose-Capillary: 194 mg/dL — ABNORMAL HIGH (ref 70–99)

## 2018-11-09 LAB — LACTIC ACID, PLASMA: Lactic Acid, Venous: 1.2 mmol/L (ref 0.5–1.9)

## 2018-11-09 MED ORDER — SODIUM CHLORIDE 0.9 % IV SOLN
2.0000 g | INTRAVENOUS | Status: DC
  Administered 2018-11-09 – 2018-11-11 (×3): 2 g via INTRAVENOUS
  Filled 2018-11-09 (×3): qty 2
  Filled 2018-11-09: qty 20

## 2018-11-09 MED ORDER — VITAMIN D (ERGOCALCIFEROL) 1.25 MG (50000 UNIT) PO CAPS
50000.0000 [IU] | ORAL_CAPSULE | ORAL | Status: DC
  Administered 2018-11-12: 50000 [IU] via ORAL
  Filled 2018-11-09: qty 1

## 2018-11-09 MED ORDER — ENOXAPARIN SODIUM 40 MG/0.4ML ~~LOC~~ SOLN
40.0000 mg | SUBCUTANEOUS | Status: DC
  Administered 2018-11-09 – 2018-11-12 (×3): 40 mg via SUBCUTANEOUS
  Filled 2018-11-09 (×4): qty 0.4

## 2018-11-09 MED ORDER — SODIUM CHLORIDE 0.9 % IV SOLN
INTRAVENOUS | Status: DC | PRN
  Administered 2018-11-09: 250 mL via INTRAVENOUS

## 2018-11-09 MED ORDER — OCUVITE-LUTEIN PO CAPS
1.0000 | ORAL_CAPSULE | Freq: Every day | ORAL | Status: DC
  Administered 2018-11-12: 1 via ORAL
  Filled 2018-11-09 (×3): qty 1

## 2018-11-09 MED ORDER — ACETAMINOPHEN 325 MG PO TABS
650.0000 mg | ORAL_TABLET | Freq: Four times a day (QID) | ORAL | Status: DC | PRN
  Administered 2018-11-12: 650 mg via ORAL
  Filled 2018-11-09: qty 2

## 2018-11-09 MED ORDER — METRONIDAZOLE IN NACL 5-0.79 MG/ML-% IV SOLN
500.0000 mg | Freq: Three times a day (TID) | INTRAVENOUS | Status: DC
  Administered 2018-11-09 – 2018-11-12 (×10): 500 mg via INTRAVENOUS
  Filled 2018-11-09 (×14): qty 100

## 2018-11-09 MED ORDER — ONDANSETRON HCL 4 MG PO TABS
4.0000 mg | ORAL_TABLET | Freq: Four times a day (QID) | ORAL | Status: DC | PRN
  Administered 2018-11-10 – 2018-11-11 (×2): 4 mg via ORAL
  Filled 2018-11-09 (×2): qty 1

## 2018-11-09 MED ORDER — GLIPIZIDE 10 MG PO TABS: 10.0000 mg | ORAL_TABLET | Freq: Every day | ORAL | Status: DC

## 2018-11-09 MED ORDER — LOSARTAN POTASSIUM 50 MG PO TABS: 100.0000 mg | ORAL_TABLET | Freq: Every day | ORAL | Status: DC

## 2018-11-09 MED ORDER — AMLODIPINE BESYLATE 5 MG PO TABS
5.0000 mg | ORAL_TABLET | Freq: Every day | ORAL | Status: DC
  Administered 2018-11-09 – 2018-11-12 (×4): 5 mg via ORAL
  Filled 2018-11-09 (×4): qty 1

## 2018-11-09 MED ORDER — SODIUM CHLORIDE 0.9% FLUSH
3.0000 mL | Freq: Two times a day (BID) | INTRAVENOUS | Status: DC
  Administered 2018-11-09 – 2018-11-12 (×7): 3 mL via INTRAVENOUS

## 2018-11-09 MED ORDER — HYDROCHLOROTHIAZIDE 25 MG PO TABS: 25.0000 mg | ORAL_TABLET | Freq: Every day | ORAL | Status: DC

## 2018-11-09 MED ORDER — POLYETHYLENE GLYCOL 3350 17 G PO PACK: 17.0000 g | PACK | Freq: Every day | ORAL | Status: DC | PRN

## 2018-11-09 MED ORDER — INSULIN ASPART 100 UNIT/ML ~~LOC~~ SOLN
0.0000 [IU] | Freq: Three times a day (TID) | SUBCUTANEOUS | Status: DC
  Administered 2018-11-09 – 2018-11-11 (×7): 3 [IU] via SUBCUTANEOUS
  Administered 2018-11-11: 2 [IU] via SUBCUTANEOUS
  Administered 2018-11-12: 3 [IU] via SUBCUTANEOUS
  Filled 2018-11-09 (×9): qty 1

## 2018-11-09 MED ORDER — ATORVASTATIN CALCIUM 20 MG PO TABS
20.0000 mg | ORAL_TABLET | Freq: Every day | ORAL | Status: DC
  Administered 2018-11-09 – 2018-11-11 (×3): 20 mg via ORAL
  Filled 2018-11-09 (×3): qty 1

## 2018-11-09 MED ORDER — INSULIN ASPART 100 UNIT/ML ~~LOC~~ SOLN: 0.0000 [IU] | Freq: Every day | SUBCUTANEOUS | Status: DC

## 2018-11-09 MED ORDER — ONDANSETRON HCL 4 MG/2ML IJ SOLN: 4.0000 mg | Freq: Four times a day (QID) | INTRAMUSCULAR | Status: DC | PRN

## 2018-11-09 MED ORDER — ACETAMINOPHEN 650 MG RE SUPP: 650.0000 mg | Freq: Four times a day (QID) | RECTAL | Status: DC | PRN

## 2018-11-09 MED ORDER — ENSURE MAX PROTEIN PO LIQD
11.0000 [oz_av] | Freq: Two times a day (BID) | ORAL | Status: DC
  Administered 2018-11-09 – 2018-11-12 (×3): 11 [oz_av] via ORAL
  Filled 2018-11-09: qty 330

## 2018-11-09 NOTE — Progress Notes (Signed)
   11/09/18 1200  Clinical Encounter Type  Visited With Patient not available  Visit Type Initial  Referral From Nurse  Consult/Referral To Chaplain   Chaplain received an OR to complete or update an AD. Upon arrival, the patient was talking on the phone with a visitor at the bedside. The chaplain offered to return at a later/better time.

## 2018-11-09 NOTE — Progress Notes (Signed)
[facility] [place] NAME: Rebecca Park    MR#:  [id number]  DATE OF BIRTH:  1960/11/02  SUBJECTIVE:   Patient presented to the hospital due to right foot ulcer/wound and noted to have right foot osteomyelitis.  Patient denies any significant pain, nausea vomiting fever or any other associated symptoms.  REVIEW OF SYSTEMS:    Review of Systems  Constitutional: Negative for chills and fever.  HENT: Negative for congestion and tinnitus.   Eyes: Negative for blurred vision and double vision.  Respiratory: Negative for cough, shortness of breath and wheezing.   Cardiovascular: Negative for chest pain, orthopnea and PND.  Gastrointestinal: Negative for abdominal pain, diarrhea, nausea and vomiting.  Genitourinary: Negative for dysuria and hematuria.  Neurological: Negative for dizziness, sensory change and focal weakness.  All other systems reviewed and are negative.   Nutrition: Heart Healthy/Carb control  Tolerating Diet: Yes Tolerating PT: Await Eval post-operatively.      DRUG ALLERGIES:  No Known Allergies  VITALS:  Blood pressure (!) 153/83, pulse 97, temperature 98.2 F (36.8 C), resp. rate 16, height 5\' 6"  (1.676 m), weight 96.8 kg, SpO2 98 %.  PHYSICAL EXAMINATION:   Physical Exam  GENERAL:  58 y.o.-year-old obese patient lying in bed in no acute distress.  EYES: Pupils equal, round, reactive to light and accommodation. No scleral icterus. Extraocular muscles intact.  HEENT: Head atraumatic, normocephalic. Oropharynx and nasopharynx clear.  NECK:  Supple, no jugular venous distention. No thyroid enlargement, no tenderness.  LUNGS: Normal breath sounds bilaterally, no wheezing, rales, rhonchi. No use of accessory muscles of respiration.  CARDIOVASCULAR: S1, S2 normal. No murmurs, rubs, or gallops.  ABDOMEN: Soft, nontender, nondistended. Bowel sounds present. No organomegaly or mass.  EXTREMITIES: No cyanosis, clubbing  or edema b/l.    NEUROLOGIC: Cranial nerves II through XII are intact. No focal Motor or sensory deficits b/l.   PSYCHIATRIC: The patient is alert and oriented x 3.  SKIN: No obvious rash, lesion.  Right toes/foot ulcer as shown below         LABORATORY PANEL:   CBC Recent Labs  Lab 11/09/18 0057  WBC 11.8*  HGB 10.6*  HCT 31.4*  PLT 202   ------------------------------------------------------------------------------------------------------------------  Chemistries  Recent Labs  Lab 11/09/18 0057  NA 135  K 3.4*  CL 98  CO2 26  GLUCOSE 180*  BUN 25*  CREATININE 0.96  CALCIUM 8.6*   ------------------------------------------------------------------------------------------------------------------  Cardiac Enzymes No results for input(s): TROPONINI in the last 168 hours. ------------------------------------------------------------------------------------------------------------------  RADIOLOGY:  No results found.   ASSESSMENT AND PLAN:   58 year old female with past medical history of diabetes, hypertension who presents to the hospital due to a fall and also noted to have a right foot fourth and fifth toe ulcer and suspected to have osteomyelitis.  1.  Right foot osteomyelitis- patient has a osteomyelitis of the right fifth toe and metatarsal and right fourth toe and metatarsal. -Continue broad-spectrum IV antibiotics Ceftriaxone, Flagyl.  Seen by podiatry and plan to do surgery tomorrow with ray amputations of both fourth and fifth toe.  Podiatry has also ordered arterial duplexes of the lower extremities to rule out peripheral vascular disease. -Continue pain control and further care as per podiatry.  2.  Diabetes type 2 without complication- continue carb controlled diet, sliding scale insulin.  Follow blood sugar stable.  3.  Essential hypertension-continue Norvasc.  4.  Hyperlipidemia-continue atorvastatin.     All the records  are reviewed and case  discussed with Care Management/Social Worker. Management plans discussed with the patient, family and they are in agreement.  CODE STATUS: Full code  DVT Prophylaxis: Lovenox  TOTAL TIME TAKING CARE OF THIS PATIENT: 30 minutes.   POSSIBLE D/C IN 2-3 DAYS, DEPENDING ON CLINICAL CONDITION.   Henreitta Leber M.D on 11/09/2018 at 2:12 PM  Between 7am to 6pm - Pager - [phone]  After 6pm go to [email or web address] - [employer] [facility]  Office  [phone]  CC: Primary care physician; Jearld Fenton, NP

## 2018-11-09 NOTE — Consult Note (Addendum)
PODIATRY / FOOT AND ANKLE SURGERY CONSULTATION NOTE  Reason for consult: Right foot osteomyelitis  Chief Complaint: Right fourth and fifth toe wounds   HPI: Rebecca Park is a 58 y.o. female who presents with an ulceration to the right fourth and fifth webspace.  Patient states that about a month ago she was gardening and suffered a laceration between the 2 toes.  Patient was seen in wound care and an MRI was performed showing osteomyelitic changes to the right fifth toe as well as fourth and fifth metatarsals.  Patient states that she has been changing her dressing daily with alginate type dressing materials.  She has been partial weightbearing to the right foot with heel contact only in a surgical [place].  Patient states that she has been feeling nauseous and has had fever and chills in the past, but she is feeling improved since her admission.  Patient's last hemoglobin A1c was 6.7%.  PMHx:  Past Medical History:  Diagnosis Date  . Diabetes mellitus without complication ([place])   . Hypertension     Surgical Hx:  Past Surgical History:  Procedure Laterality Date  . BUNIONECTOMY Bilateral   . COLONOSCOPY WITH PROPOFOL N/A 09/07/2018   Procedure: COLONOSCOPY WITH PROPOFOL;  Surgeon: Lin Landsman, MD;  Location[facility] ENDOSCOPY;  Service: Gastroenterology;  Laterality: N/A;    FHx:  Family History  Problem Relation Age of Onset  . Heart disease Father   . Heart attack Father   . Heart disease Maternal Grandfather     Social History:  reports that she has never smoked. She has never used smokeless tobacco. She reports current alcohol use. She reports that she does not use drugs.  Allergies: No Known Allergies  Review of Systems: General ROS: negative Psychological ROS: negative Respiratory ROS: no cough, shortness of breath, or wheezing Cardiovascular ROS: no chest pain or dyspnea on exertion Musculoskeletal ROS: positive for - joint swelling Neurological ROS: positive for -  numbness/tingling Dermatological ROS: positive for Right foot wound  Medications Prior to Admission  Medication Sig Dispense Refill  . amLODipine (NORVASC) 5 MG tablet TAKE 1 TABLET BY MOUTH EVERY DAY (Patient taking differently: Take 5 mg by mouth daily. ) 90 tablet 0  . atorvastatin (LIPITOR) 20 MG tablet Take 1 tablet (20 mg total) by mouth daily. 90 tablet 3  . glipiZIDE (GLUCOTROL) 10 MG tablet Take 1 tablet (10 mg total) by mouth daily before breakfast. 60 tablet 2  . hydrochlorothiazide (HYDRODIURIL) 25 MG tablet Take 1 tablet (25 mg total) by mouth daily. 90 tablet 3  . losartan (COZAAR) 50 MG tablet TAKE 2 TABLETS BY MOUTH EVERY DAY (Patient taking differently: Take 100 mg by mouth daily. ) 180 tablet 0  . Multiple Vitamins-Minerals (WOMENS MULTIVITAMIN PO) Take 1 tablet by mouth daily.     . Vitamin D, Ergocalciferol, (DRISDOL) 1.25 MG (50000 UT) CAPS capsule Take 1 capsule (50,000 Units total) by mouth every 7 (seven) days. 12 capsule 0    Physical Exam: General: Alert and oriented.  No apparent distress.  Vascular: DP/PT pulses intact bilaterally, capillary fill time intact the digits bilaterally, no hair growth to the digits noted bilaterally.  Edema and erythema noted to the right distal lateral foot.  Both feet are warm to touch.  Neuro: Light touch sensation diminished to bilateral lower extremities.  Derm: Ulceration to the right webspace between fourth and fifth toes with extension to the plantar fourth and fifth metatarsal heads, measures: 2 cm x 2.5  cm x 1.2 cm deep, probes to bone at the level of the fourth and fifth metatarsal heads and base of the proximal phalanx of the fourth and fifth toes, fibronecrotic wound beds, serous sanguinous/fibrotic wound debris/drainage.  MSK: No pain on palpation of the right forefoot.  Results for orders placed or performed during the hospital encounter of 11/08/18 (from the past 48 hour(s))  Basic metabolic panel     Status: Abnormal    Collection Time: 11/08/18  4:01 PM  Result Value Ref Range   Sodium 129 (L) 135 - 145 mmol/L   Potassium 2.9 (L) 3.5 - 5.1 mmol/L   Chloride 89 (L) 98 - 111 mmol/L   CO2 25 22 - 32 mmol/L   Glucose, Bld 217 (H) 70 - 99 mg/dL   BUN 29 (H) 6 - 20 mg/dL   Creatinine, Ser 1.09 (H) 0.44 - 1.00 mg/dL   Calcium 9.4 8.9 - 10.3 mg/dL   GFR calc non Af Amer 56 (L) >60 mL/min   GFR calc Af Amer >60 >60 mL/min   Anion gap 15 5 - 15    Comment: Performed at [facility], [address] [place], [place] [address]  CBC     Status: Abnormal   Collection Time: 11/08/18  4:01 PM  Result Value Ref Range   WBC 14.5 (H) 4.0 - 10.5 K/uL   RBC 3.99 3.87 - 5.11 MIL/uL   Hemoglobin 11.7 (L) 12.0 - 15.0 g/dL   HCT 34.3 (L) 36.0 - 46.0 %   MCV 86.0 80.0 - 100.0 fL   MCH 29.3 26.0 - 34.0 pg   MCHC 34.1 30.0 - 36.0 g/dL   RDW 12.5 11.5 - 15.5 %   Platelets 249 150 - 400 K/uL   nRBC 0.0 0.0 - 0.2 %    Comment: Performed at [facility], [address] [place], [place] [address]  Urinalysis, Complete w Microscopic     Status: Abnormal   Collection Time: 11/08/18  4:01 PM  Result Value Ref Range   Color, Urine YELLOW (A) YELLOW   APPearance HAZY (A) CLEAR   Specific Gravity, Urine 1.009 1.005 - 1.030   pH 5.0 5.0 - 8.0   Glucose, UA NEGATIVE NEGATIVE mg/dL   Hgb urine dipstick NEGATIVE NEGATIVE   Bilirubin Urine NEGATIVE NEGATIVE   Ketones, ur NEGATIVE NEGATIVE mg/dL   Protein, ur NEGATIVE NEGATIVE mg/dL   Nitrite NEGATIVE NEGATIVE   Leukocytes,Ua SMALL (A) NEGATIVE   RBC / HPF 0-5 0 - 5 RBC/hpf   WBC, UA 11-20 0 - 5 WBC/hpf   Bacteria, UA NONE SEEN NONE SEEN   Squamous Epithelial / LPF 6-10 0 - 5   Mucus PRESENT    Hyaline Casts, UA PRESENT     Comment: Performed at [facility], [place], [place] [address]  Troponin I (High Sensitivity)     Status: None   Collection Time: 11/08/18  4:01 PM  Result Value Ref Range   Troponin I (High Sensitivity)  8 <18 ng/L    Comment: (NOTE) Elevated high sensitivity troponin I (hsTnI) values and significant  changes across serial measurements may suggest ACS but many other  chronic and acute conditions are known to elevate hsTnI results.  Refer to the "Links" section for chest pain algorithms and additional  guidance. Performed at [facility], [address] [place], [place] [address]   Troponin I (High Sensitivity)     Status: None   Collection Time:  11/08/18  6:39 PM  Result Value Ref Range   Troponin I (High Sensitivity) 8 <18 ng/L    Comment: (NOTE) Elevated high sensitivity troponin I (hsTnI) values and significant  changes across serial measurements may suggest ACS but many other  chronic and acute conditions are known to elevate hsTnI results.  Refer to the "Links" section for chest pain algorithms and additional  guidance. Performed at [facility], [address] [place], [place] [address]   Lactic acid, plasma     Status: None   Collection Time: 11/08/18  6:39 PM  Result Value Ref Range   Lactic Acid, Venous 1.2 0.5 - 1.9 mmol/L    Comment: Performed at [facility], [address] [place], [place] [address]  Culture, blood (routine x 2)     Status: None (Preliminary result)   Collection Time: 11/08/18  6:39 PM   Specimen: BLOOD  Result Value Ref Range   Specimen Description BLOOD BLOOD RIGHT FOREARM    Special Requests      BOTTLES DRAWN AEROBIC AND ANAEROBIC Blood Culture results may not be optimal due to an inadequate volume of blood received in culture bottles   Culture      NO GROWTH < 12 HOURS Performed at [facility], [address], [place], [place] [address]    Report Status PENDING   SARS Coronavirus 2 [facility] order, Performed in [facility] hospital lab) Nasopharyngeal Nasopharyngeal Swab     Status: None   Collection Time: 11/08/18  6:39 PM   Specimen: Nasopharyngeal Swab  Result Value Ref Range   SARS  Coronavirus 2 NEGATIVE NEGATIVE    Comment: (NOTE) If result is NEGATIVE SARS-CoV-2 target nucleic acids are NOT DETECTED. The SARS-CoV-2 RNA is generally detectable in upper and lower  respiratory specimens during the acute phase of infection. The lowest  concentration of SARS-CoV-2 viral copies this assay can detect is 250  copies / mL. A negative result does not preclude SARS-CoV-2 infection  and should not be used as the sole basis for treatment or other  patient management decisions.  A negative result may occur with  improper specimen collection / handling, submission of specimen other  than nasopharyngeal swab, presence of viral mutation(s) within the  areas targeted by this assay, and inadequate number of viral copies  (<250 copies / mL). A negative result must be combined with clinical  observations, patient history, and epidemiological information. If result is POSITIVE SARS-CoV-2 target nucleic acids are DETECTED. The SARS-CoV-2 RNA is generally detectable in upper and lower  respiratory specimens dur ing the acute phase of infection.  Positive  results are indicative of active infection with SARS-CoV-2.  Clinical  correlation with patient history and other diagnostic information is  necessary to determine patient infection status.  Positive results do  not rule out bacterial infection or co-infection with other viruses. If result is PRESUMPTIVE POSTIVE SARS-CoV-2 nucleic acids MAY BE PRESENT.   A presumptive positive result was obtained on the submitted specimen  and confirmed on repeat testing.  While 2019 novel coronavirus  (SARS-CoV-2) nucleic acids may be present in the submitted sample  additional confirmatory testing may be necessary for epidemiological  and / or clinical management purposes  to differentiate between  SARS-CoV-2 and other Sarbecovirus currently known to infect humans.  If clinically indicated additional testing with an alternate test  methodology  [phone]) is advised. The SARS-CoV-2 RNA is generally  detectable in upper and lower respiratory sp ecimens during  the acute  phase of infection. The expected result is Negative. Fact Sheet for Patients:  StrictlyIdeas.no Fact Sheet for Healthcare Providers: BankingDealers.co.za This test is not yet approved or cleared by the [place] FDA and has been authorized for detection and/or diagnosis of SARS-CoV-2 by FDA under an Emergency Use Authorization (EUA).  This EUA will remain in effect (meaning this test can be used) for the duration of the COVID-19 declaration under Section 564(b)(1) of the Act, 21 U.S.C. section 360bbb-3(b)(1), unless the authorization is terminated or revoked sooner. Performed at [facility], [address] [place], [place] [address]   Culture, blood (routine x 2)     Status: None (Preliminary result)   Collection Time: 11/08/18  6:40 PM   Specimen: BLOOD  Result Value Ref Range   Specimen Description BLOOD LEFT ANTECUBITAL    Special Requests      BOTTLES DRAWN AEROBIC AND ANAEROBIC Blood Culture results may not be optimal due to an excessive volume of blood received in culture bottles   Culture      NO GROWTH < 12 HOURS Performed at [facility], [address] [place], [place] [address]    Report Status PENDING   Glucose, capillary     Status: Abnormal   Collection Time: 11/09/18 12:49 AM  Result Value Ref Range   Glucose-Capillary 157 (H) 70 - 99 mg/dL   Comment 1 Notify RN   Lactic acid, plasma     Status: None   Collection Time: 11/09/18 12:57 AM  Result Value Ref Range   Lactic Acid, Venous 1.2 0.5 - 1.9 mmol/L    Comment: Performed at [facility], [address] [place], [place] [address]  Hemoglobin A1c     Status: Abnormal   Collection Time: 11/09/18 12:57 AM  Result Value Ref Range   Hgb A1c MFr Bld 6.7 (H) 4.8 - 5.6 %    Comment: (NOTE) Pre diabetes:           5.7%-6.4% Diabetes:              >6.4% Glycemic control for   <7.0% adults with diabetes    Mean Plasma Glucose 145.59 mg/dL    Comment: Performed at [facility], [place] [address] [place], [place] [address]  Basic metabolic panel     Status: Abnormal   Collection Time: 11/09/18 12:57 AM  Result Value Ref Range   Sodium 135 135 - 145 mmol/L   Potassium 3.4 (L) 3.5 - 5.1 mmol/L   Chloride 98 98 - 111 mmol/L   CO2 26 22 - 32 mmol/L   Glucose, Bld 180 (H) 70 - 99 mg/dL   BUN 25 (H) 6 - 20 mg/dL   Creatinine, Ser 0.96 0.44 - 1.00 mg/dL   Calcium 8.6 (L) 8.9 - 10.3 mg/dL   GFR calc non Af Amer >60 >60 mL/min   GFR calc Af Amer >60 >60 mL/min   Anion gap 11 5 - 15    Comment: Performed at [facility], [address] [place],  [address]  CBC     Status: Abnormal   Collection Time: 11/09/18 12:57 AM  Result Value Ref Range   WBC 11.8 (H) 4.0 - 10.5 K/uL   RBC 3.62 (L) 3.87 - 5.11 MIL/uL   Hemoglobin 10.6 (L) 12.0 - 15.0 g/dL   HCT 31.4 (L) 36.0 - 46.0 %   MCV 86.7 80.0 - 100.0 fL   MCH 29.3 26.0 - 34.0 pg  MCHC 33.8 30.0 - 36.0 g/dL   RDW 12.5 11.5 - 15.5 %   Platelets 202 150 - 400 K/uL   nRBC 0.0 0.0 - 0.2 %    Comment: Performed at [facility], [address] [place], [place] [address]  Glucose, capillary     Status: Abnormal   Collection Time: 11/09/18  8:28 AM  Result Value Ref Range   Glucose-Capillary 175 (H) 70 - 99 mg/dL   No results found.  Blood pressure (!) 153/83, pulse 97, temperature 98.2 F (36.8 C), resp. rate 16, height 5\' 6"  (1.676 m), weight 96.8 kg, SpO2 98 %.  Assessment 1. 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 Cellulitis right foot 3. Diabetes type 2 with polyneuropathy 4. Ulceration right foot with fat layer and bone exposed  Plan -Examine both feet. -Reviewed and examined x-ray and MRI imaging to the right foot.  Osteomyelitis appears to be present in possibly the  right fourth and fifth metatarsal heads as well as the entirety of the fifth toe. -Based on examination and radiographic imaging, believe patient has osteomyelitis of the right fifth toe and metatarsal and possibly right fourth toe and metatarsal. -Believe patient has adequate blood flow for healing based on clinical examination.  Will order baseline arterial Dopplers to assess flow further. -Discussed treatment options with the patient both conservative and surgical attempts at correction including the potential risks and complications of surgical intervention.  At this time the patient has elected for surgical intervention consisting of right partial fourth and fifth ray amputations, but will make intraop determination.  Discussed postoperative course in detail with the patient.  Patient understands and is agreeable. -Patient will be n.p.o. at midnight's 11/09/18 surgery.  Plan for surgery at 10 AM on 11/10/2018. -Continue Betadine dressings to the right foot ulceration daily until surgery. -Appreciate recommendations for antibiotic therapy at this time.  We will take intraoperative cultures to guide therapy.  Anticipate complete resection of infection with amputation.  Likely can transition to oral antibiotics after procedure.   Caroline More 11/09/2018, 11:48 AM

## 2018-11-09 NOTE — Progress Notes (Signed)
   11/09/18 2000  Clinical Encounter Type  Visited With Patient  Visit Type Follow-up  Referral From Nurse  Consult/Referral To Chaplain   Chaplain followed up with the patient GR:MBOBOF or created AD. Upon arrival, the patient was in bed with the lights dimmed. She was awake, alert, and oriented. The patient reported that she was feeling "worried" about her upcoming surgery for amputation of her 5th toe. Chaplain provided support in the form of compassionate presence, active and reflective listening and encouragement. Chaplain confirmed that the patient was interested in hearing about the AD; education provided and brochure left for her completion at a later time.

## 2018-11-09 NOTE — Consult Note (Signed)
[facility] Nurse wound consult note Reason for Consult:Right plantar foot ulceration with osteomyelitis to 4th and 5th metatarsals.  Surgery plans amputation of these two tomorrow.  Betadine dressings daily until surgery per MD orders.  No further WOC needs at this time.  Please re consult if we are needed postoperatively.  Will not follow at this time.  Please re-consult if needed.  Domenic Moras MSN, RN, FNP-BC CWON Wound, Ostomy, Continence Nurse Pager [phone]

## 2018-11-09 NOTE — Progress Notes (Signed)
Initial Nutrition Assessment  RD working remotely.  DOCUMENTATION CODES:   Obesity unspecified  INTERVENTION:  Provide Ensure Max Protein po BID, each supplement provides 150 kcal and 30 grams of protein.  Provide Ocuvite daily for wound healing (provides zinc, vitamin A, vitamin C, Vitamin E, copper, and selenium).  NUTRITION DIAGNOSIS:   Increased nutrient needs related to wound healing as evidenced by estimated needs.  GOAL:   Patient will meet greater than or equal to 90% of their needs  MONITOR:   PO intake, Supplement acceptance, Labs, Weight trends, Skin, I & O's  REASON FOR ASSESSMENT:   Malnutrition Screening Tool    ASSESSMENT:   58 year old female with PMHx of DM, HTN admitted with right fifth toe osteomyelitis.   Spoke with patient over the phone. Her wife was also present in the room. She reports her appetite has been decreased for the past 2-3 weeks. She attempts to eat at meals but was only eating bites at home. She had considered buying some Boost to supplement her intake but they did not have the chance to get any before they were directed to come into the hospital. She is amenable to trying ONS to help meet calorie/protein needs. She reports she does feel like eating today and she had ordered some [place] and vegetables for lunch.  Patient reports she has lost about 10 lbs in the past two weeks. Per chart she was 101.2 kg on 10/16/2018. She is now 96.8 kg (213.3 lbs). She has lost 4.4 kg (4.3% body weight) over the past two weeks per pt report, which is significant for time frame.  Medications reviewed and include: Novolog 0-15 units TID, Novolog 0-5 units QHS, ergocalciferol 50000 units every 7 days, ceftriaxone, Flagyl.  Labs reviewed: CBG 157-175, Potassium 3.4.  Patient is at risk for malnutrition.   NUTRITION - FOCUSED PHYSICAL EXAM:  Unable to complete at this time.  Diet Order:   Diet Order            Diet NPO time specified Except for: Sips  with Meds  Diet effective midnight        Diet heart healthy/carb modified Room service appropriate? Yes; Fluid consistency: Thin  Diet effective now             EDUCATION NEEDS:   No education needs have been identified at this time  Skin:  Skin Assessment: Skin Integrity Issues:(diabetic foot ulcer right foot)  Last BM:  11/05/2018 per chart  Height:   Ht Readings from Last 1 Encounters:  11/09/18 5\' 6"  (1.676 m)   Weight:   Wt Readings from Last 1 Encounters:  11/09/18 96.8 kg   Ideal Body Weight:  59.1 kg  BMI:  Body mass index is 34.43 kg/m.  Estimated Nutritional Needs:   Kcal:  1900-2100  Protein:  95-105 grams  Fluid:  2 L/day  Willey Blade, MS, RD, LDN Office: [phone] Pager: [phone] After Hours/Weekend Pager: [phone]

## 2018-11-10 ENCOUNTER — Inpatient Hospital Stay: Payer: BC Managed Care – PPO

## 2018-11-10 ENCOUNTER — Inpatient Hospital Stay: Payer: BC Managed Care – PPO | Admitting: Anesthesiology

## 2018-11-10 ENCOUNTER — Encounter: Payer: Self-pay | Admitting: Anesthesiology

## 2018-11-10 ENCOUNTER — Encounter
Admission: EM | Disposition: A | Discharge status: Home Health | Payer: Self-pay | Source: Home / Self Care | Attending: Specialist

## 2018-11-10 HISTORY — PX: BONE BIOPSY: SHX375

## 2018-11-10 HISTORY — PX: AMPUTATION: SHX166

## 2018-11-10 LAB — GLUCOSE, CAPILLARY
Glucose-Capillary: 155 mg/dL — ABNORMAL HIGH (ref 70–99)
Glucose-Capillary: 151 mg/dL — ABNORMAL HIGH (ref 70–99)
Glucose-Capillary: 129 mg/dL — ABNORMAL HIGH (ref 70–99)
Glucose-Capillary: 135 mg/dL — ABNORMAL HIGH (ref 70–99)
Glucose-Capillary: 200 mg/dL — ABNORMAL HIGH (ref 70–99)
Glucose-Capillary: 185 mg/dL — ABNORMAL HIGH (ref 70–99)

## 2018-11-10 LAB — POTASSIUM
Potassium: 2.9 mmol/L — ABNORMAL LOW (ref 3.5–5.1)
Potassium: 3 mmol/L — ABNORMAL LOW (ref 3.5–5.1)

## 2018-11-10 LAB — MAGNESIUM: Magnesium: 1.7 mg/dL (ref 1.7–2.4)

## 2018-11-10 LAB — SURGICAL PCR SCREEN
MRSA, PCR: NEGATIVE
Staphylococcus aureus: NEGATIVE

## 2018-11-10 SURGERY — AMPUTATION, FOOT, RAY
Anesthesia: General | Laterality: Right

## 2018-11-10 MED ORDER — ONDANSETRON HCL 4 MG/2ML IJ SOLN: 4.0000 mg | Freq: Once | INTRAMUSCULAR | Status: DC | PRN

## 2018-11-10 MED ORDER — BUPIVACAINE HCL 0.5 % IJ SOLN
INTRAMUSCULAR | Status: DC | PRN
  Administered 2018-11-10: 20 mL

## 2018-11-10 MED ORDER — PROPOFOL 10 MG/ML IV BOLUS
INTRAVENOUS | Status: AC
  Filled 2018-11-10: qty 20

## 2018-11-10 MED ORDER — ONDANSETRON HCL 4 MG/2ML IJ SOLN
INTRAMUSCULAR | Status: DC | PRN
  Administered 2018-11-10: 4 mg via INTRAVENOUS

## 2018-11-10 MED ORDER — FENTANYL CITRATE (PF) 100 MCG/2ML IJ SOLN
INTRAMUSCULAR | Status: AC
  Filled 2018-11-10: qty 2

## 2018-11-10 MED ORDER — POTASSIUM CHLORIDE CRYS ER 20 MEQ PO TBCR
60.0000 meq | EXTENDED_RELEASE_TABLET | Freq: Once | ORAL | Status: AC
  Administered 2018-11-10: 09:00:00 60 meq via ORAL
  Filled 2018-11-10: qty 3

## 2018-11-10 MED ORDER — EPHEDRINE SULFATE 50 MG/ML IJ SOLN
INTRAMUSCULAR | Status: DC | PRN
  Administered 2018-11-10: 10 mg via INTRAVENOUS

## 2018-11-10 MED ORDER — VANCOMYCIN HCL 1000 MG IV SOLR
INTRAVENOUS | Status: AC
  Filled 2018-11-10: qty 1000

## 2018-11-10 MED ORDER — LIDOCAINE HCL (PF) 2 % IJ SOLN
INTRAMUSCULAR | Status: AC
  Filled 2018-11-10: qty 10

## 2018-11-10 MED ORDER — NEOMYCIN-POLYMYXIN B GU 40-200000 IR SOLN
Status: DC | PRN
  Administered 2018-11-10: 2 mL

## 2018-11-10 MED ORDER — MIDAZOLAM HCL 2 MG/2ML IJ SOLN
INTRAMUSCULAR | Status: DC | PRN
  Administered 2018-11-10: 2 mg via INTRAVENOUS

## 2018-11-10 MED ORDER — PROPOFOL 10 MG/ML IV BOLUS
INTRAVENOUS | Status: DC | PRN
  Administered 2018-11-10: 100 mg via INTRAVENOUS

## 2018-11-10 MED ORDER — FENTANYL CITRATE (PF) 100 MCG/2ML IJ SOLN: 25.0000 ug | INTRAMUSCULAR | Status: DC | PRN

## 2018-11-10 MED ORDER — VANCOMYCIN HCL 1000 MG IV SOLR
INTRAVENOUS | Status: DC | PRN
  Administered 2018-11-10: 1000 mg via TOPICAL

## 2018-11-10 MED ORDER — MIDAZOLAM HCL 2 MG/2ML IJ SOLN
INTRAMUSCULAR | Status: AC
  Filled 2018-11-10: qty 2

## 2018-11-10 MED ORDER — FENTANYL CITRATE (PF) 100 MCG/2ML IJ SOLN
INTRAMUSCULAR | Status: DC | PRN
  Administered 2018-11-10 (×4): 25 ug via INTRAVENOUS

## 2018-11-10 MED ORDER — SODIUM CHLORIDE 0.9 % IV SOLN
INTRAVENOUS | Status: DC
  Administered 2018-11-10 – 2018-11-11 (×3): via INTRAVENOUS

## 2018-11-10 MED ORDER — PROPOFOL 500 MG/50ML IV EMUL
INTRAVENOUS | Status: DC | PRN
  Administered 2018-11-10: 200 ug/kg/min via INTRAVENOUS

## 2018-11-10 MED ORDER — ONDANSETRON HCL 4 MG/2ML IJ SOLN
INTRAMUSCULAR | Status: AC
  Filled 2018-11-10: qty 2

## 2018-11-10 MED ORDER — PROPOFOL 500 MG/50ML IV EMUL
INTRAVENOUS | Status: AC
  Filled 2018-11-10: qty 50

## 2018-11-10 MED ORDER — EPHEDRINE SULFATE 50 MG/ML IJ SOLN
INTRAMUSCULAR | Status: AC
  Filled 2018-11-10: qty 1

## 2018-11-10 SURGICAL SUPPLY — 51 items
BLADE MED AGGRESSIVE (BLADE) ×2 IMPLANT
BLADE OSC/SAGITTAL MD 5.5X18 (BLADE) ×1 IMPLANT
BLADE SURG 15 STRL LF DISP TIS (BLADE) ×2 IMPLANT
BLADE SURG 15 STRL SS (BLADE) ×1
BLADE SURG MINI STRL (BLADE) ×1 IMPLANT
BNDG CONFORM 2 STRL LF (GAUZE/BANDAGES/DRESSINGS) ×2 IMPLANT
BNDG ELASTIC 4X5.8 VLCR STR LF (GAUZE/BANDAGES/DRESSINGS) ×2 IMPLANT
BNDG ESMARK 4X12 TAN STRL LF (GAUZE/BANDAGES/DRESSINGS) ×2 IMPLANT
BNDG GAUZE 4.5X4.1 6PLY STRL (MISCELLANEOUS) ×2 IMPLANT
CANISTER SUCT 1200ML W/VALVE (MISCELLANEOUS) ×2 IMPLANT
CNTNR SPEC 2.5X3XGRAD LEK (MISCELLANEOUS) ×1
CONT SPEC 4OZ STER OR WHT (MISCELLANEOUS) ×1
CONTAINER SPEC 2.5X3XGRAD LEK (MISCELLANEOUS) IMPLANT
COVER WAND RF STERILE (DRAPES) ×2 IMPLANT
CUFF TOURN 18 STER (MISCELLANEOUS) ×2 IMPLANT
CUFF TOURN DUAL PL 12 NO SLV (MISCELLANEOUS) ×2 IMPLANT
DRAPE FLUOR MINI C-ARM 54X84 (DRAPES) ×1 IMPLANT
DURAPREP 26ML APPLICATOR (WOUND CARE) ×2 IMPLANT
ELECT REM PT RETURN 9FT ADLT (ELECTROSURGICAL) ×2
ELECTRODE REM PT RTRN 9FT ADLT (ELECTROSURGICAL) ×1 IMPLANT
GAUZE SPONGE 4X4 12PLY STRL (GAUZE/BANDAGES/DRESSINGS) ×2 IMPLANT
GAUZE XEROFORM 1X8 LF (GAUZE/BANDAGES/DRESSINGS) ×2 IMPLANT
GLOVE BIO SURGEON STRL SZ7 (GLOVE) ×1 IMPLANT
GLOVE BIO SURGEON STRL SZ7.5 (GLOVE) ×2 IMPLANT
GLOVE INDICATOR 7.0 STRL GRN (GLOVE) ×1 IMPLANT
GLOVE INDICATOR 8.0 STRL GRN (GLOVE) ×2 IMPLANT
GOWN STRL REUS W/ TWL LRG LVL3 (GOWN DISPOSABLE) ×2 IMPLANT
GOWN STRL REUS W/TWL LRG LVL3 (GOWN DISPOSABLE) ×2
HANDPIECE VERSAJET DEBRIDEMENT (MISCELLANEOUS) ×1 IMPLANT
KIT TURNOVER KIT A (KITS) ×2 IMPLANT
LABEL OR SOLS (LABEL) ×1 IMPLANT
NDL FILTER BLUNT 18X1 1/2 (NEEDLE) ×1 IMPLANT
NDL HYPO 25X1 1.5 SAFETY (NEEDLE) ×2 IMPLANT
NEEDLE FILTER BLUNT 18X 1/2SAF (NEEDLE) ×1
NEEDLE FILTER BLUNT 18X1 1/2 (NEEDLE) ×1 IMPLANT
NEEDLE HYPO 25X1 1.5 SAFETY (NEEDLE) ×2 IMPLANT
NS IRRIG 500ML POUR BTL (IV SOLUTION) ×2 IMPLANT
PACK EXTREMITY ARMC (MISCELLANEOUS) ×2 IMPLANT
PAD ABD DERMACEA PRESS 5X9 (GAUZE/BANDAGES/DRESSINGS) ×1 IMPLANT
SOL .9 NS 3000ML IRR  AL (IV SOLUTION) ×1
SOL .9 NS 3000ML IRR UROMATIC (IV SOLUTION) ×1 IMPLANT
SOL PREP PVP 2OZ (MISCELLANEOUS) ×2
SOLUTION PREP PVP 2OZ (MISCELLANEOUS) ×1 IMPLANT
STOCKINETTE STRL 6IN 960660 (GAUZE/BANDAGES/DRESSINGS) ×2 IMPLANT
STRIP CLOSURE SKIN 1/4X4 (GAUZE/BANDAGES/DRESSINGS) ×1 IMPLANT
SUT ETHILON 3-0 FS-10 30 BLK (SUTURE) ×4
SUT VIC AB 3-0 SH 27 (SUTURE) ×1
SUT VIC AB 3-0 SH 27X BRD (SUTURE) IMPLANT
SUTURE EHLN 3-0 FS-10 30 BLK (SUTURE) ×1 IMPLANT
SWAB CULTURE AMIES ANAERIB BLU (MISCELLANEOUS) ×1 IMPLANT
SYR 10ML LL (SYRINGE) ×2 IMPLANT

## 2018-11-10 NOTE — H&P (Signed)
HISTORY AND PHYSICAL INTERVAL NOTE:  11/10/2018  11:17 AM  Rebecca Park  has presented today for surgery, with the diagnosis of Infection right 4th and 5th toes/rays due to an ulceration.  The various methods of treatment have been discussed with the patient.  No guarantees were given.  After consideration of risks, benefits and other options for treatment, the patient has consented to surgery.  I have reviewed the patients' chart and labs.     A history and physical examination was performed in my office.  The patient was reexamined.  There have been no changes to this history and physical examination.  Caroline More

## 2018-11-10 NOTE — Op Note (Signed)
PODIATRY / FOOT AND ANKLE SURGERY OPERATIVE REPORT    SURGEON: Caroline More, DPM  PRE-OPERATIVE DIAGNOSIS:  1.  Osteomyelitis right fifth ray 2.  Questionable osteomyelitis right fourth metatarsal head  POST-OPERATIVE DIAGNOSIS:  1.  Osteomyelitis right fifth ray  PROCEDURE(S): 1. Partial right fifth ray amputation 2. Bone biopsy right fourth metatarsal head  HEMOSTASIS: Ankle tourniquet  ANESTHESIA: MAC  ESTIMATED BLOOD LOSS: 5 cc  FINDING(S): 1.  Osteomyelitis right fifth digit and metatarsal head 2.  No tracking of the wound proximally and clinically/visually no evidence of osteomyelitis right fourth MTPJ, bone biopsy taken. 3.  Believe all infection was removed with amputation and patient can transition to oral antibiotics based on culture results  PATHOLOGY/SPECIMEN(S): Right foot wound, right fourth met head bone biopsy  INDICATIONS:   Rebecca Park is a 58 y.o. female who presents with an ulceration to the right fourth intermetatarsal webspace.  Patient states that she has had a wound for about a month in this area due to a gardening accident.  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  She was seen in wound care and subsequently sent to the hospital for admission and further work-up.  MRI imaging showed osteomyelitis of the right fifth toe and a portion of the fifth metatarsal head, questionable changes at the plantar aspect of the fifth metatarsal head with concern for osteomyelitis.  Discussed all treatment options with the patient both conservative and surgical attempts at correction at this time patient has elected for surgical intervention consisting of right partial fifth ray amputation with possible fourth ray partial amputation or bone biopsy.  Patient agreeable to procedure and postop course was discussed in detail.  DESCRIPTION: After obtaining full informed written consent, the patient was brought back to the operating room and placed  supine upon the operating table.  The patient received IV antibiotics prior to induction.  After obtaining adequate anesthesia a preop block with 20 cc of half percent Marcaine plain was injected about the operative site of the right fifth ray and fourth ray, the patient was prepped and draped in the standard fashion.  Measurement bandage was used to exsanguinate the right lower extremity and the pneumatic ankle tourniquet was inflated.  Attention was then directed to the right fifth ray at the level of the metatarsal phalangeal joint where a racquet type incision was made about the fifth toe with an arm of the incision extending laterally across the fifth metatarsal distally.  The incision was made straight to bone in this area.  Once the incision was made approximately 3 cc of purulence was released from the fourth webspace.  A wound culture was taken from this area and sent off for culture.  At this time the fifth metatarsal phalangeal joint was identified and a capsular and periosteal incision was made followed by release the collateral and suspensory ligaments in the knee attachments to the plantar plate and flexor tendon and the fifth toe was disarticulated and passed off the operative site.  The base the fifth toe appeared to be fractured with obvious signs of osteomyelitis.  The fifth metatarsal head appeared to have mild degeneration consistent with changes of osteomyelitis so circumferential dissection was performed around the fifth metatarsal head.  The sagittal bone saw was then used to resect the fifth metatarsal head at the distal shaft level with the appropriate beveling.  Debridement was performed of the soft tissue in the first interspace removing all nonviable necrotic tissue in the area.  The capsule appeared to be intact of the fourth metatarsal phalangeal joint with no clear evidence of osteomyelitis.  The tissues appeared to be healthy and viable in this region.  The surgical site was  flushed with copious amounts normal sterile saline.  A small capsular incision was made into the fourth metatarsal phalangeal joint and the fourth metatarsal phalangeal joint was visualized.  There did not appear to be any clinical signs of osteomyelitis in this area as the bone appeared to be healthy and viable.  A bone biopsy was taken of the plantar lateral aspect of the fourth metatarsal head and sent off for culture/pathology.  The surgical site was once again flushed with copious amounts normal sterile saline.  1 g of vancomycin powder was then placed into the surgical site.  The subcutaneous tissue was then reapproximated well coapted with 3-0 Vicryl, the skin was then reapproximated well coapted with 3-0 nylon in a combination of simple and horizontal mattress type stitching.  The incision site appeared to be well coapted overall with minimal skin tension.  The pneumatic ankle tourniquet was deflated and a prompt hyperemic response was noted all digits that remained of the right foot.  A postoperative dressing was applied consisting of Xeroform followed by 4 x 4 gauze, ABD, Kerlix, and Ace wrap.  The patient tolerated the procedure and anesthesia well was transferred to the recovery room with all signs stable vascular status intact to all toes the right foot.  The patient will be discharged back to the inpatient room with the following written oral and postop instructions: Keep surgical dressings clean, dry, and intact, remain partial weightbearing to the right heel for transfers only with use of the surgical Cotroneo, take postop pain and antibiotic medication as prescribed if prescribed, elevate and ice right lower extremity when at rest, patient will continue to be followed up with until discharge.  COMPLICATIONS: None  CONDITION: Good, stable  Believe patient can likely transition from IV to oral antibiotics based on culture results.  Do not believe any residual infection is still within the right  foot but will follow culture results closely.  Caroline More

## 2018-11-10 NOTE — Progress Notes (Signed)
PT Cancellation Note  Patient Details Name: DANIAH ZALDIVAR MRN: [id number] DOB: 02-Nov-1960   Cancelled Treatment:    Reason Eval/Treat Not Completed: Other (comment). Consult received and chart reviewed. Pt is s/p 5th MT amputation this date. Will evaluate next date.   Whitleigh Garramone 11/10/2018, 2:44 PM Greggory Stallion, PT, DPT [phone]

## 2018-11-10 NOTE — Anesthesia Procedure Notes (Signed)
Procedure Name: LMA Insertion Date/Time: 11/10/2018 11:30 AM Performed by: Babs Sciara, CRNA Pre-anesthesia Checklist: Patient identified, Emergency Drugs available, Suction available, Patient being monitored and Timeout performed Patient Re-evaluated:Patient Re-evaluated prior to induction Oxygen Delivery Method: Circle system utilized Preoxygenation: Pre-oxygenation with 100% oxygen Induction Type: IV induction Ventilation: Mask ventilation without difficulty LMA: LMA inserted LMA Size: 4.0 Number of attempts: 1 Placement Confirmation: positive ETCO2 and breath sounds checked- equal and bilateral Dental Injury: Teeth and Oropharynx as per pre-operative assessment

## 2018-11-10 NOTE — Anesthesia Preprocedure Evaluation (Addendum)
Anesthesia Evaluation  Patient identified by MRN, date of birth, ID band Patient awake    Reviewed: Allergy & Precautions, H&P , NPO status , reviewed documented beta blocker date and time   Airway Mallampati: II  TM Distance: >3 FB Neck ROM: full    Dental  (+) Teeth Intact   Pulmonary neg pulmonary ROS,    Pulmonary exam normal        Cardiovascular hypertension, Pt. on medications Normal cardiovascular exam     Neuro/Psych negative neurological ROS  negative psych ROS   GI/Hepatic negative GI ROS, Neg liver ROS,   Endo/Other  diabetes, Type 2  Renal/GU negative Renal ROS  negative genitourinary   Musculoskeletal negative musculoskeletal ROS (+)   Abdominal   Peds negative pediatric ROS (+)  Hematology negative hematology ROS (+)   Anesthesia Other Findings Past Medical History: No date: Diabetes mellitus without complication (HCC) No date: Hypertension  Past Surgical History: No date: BUNIONECTOMY; Bilateral     Reproductive/Obstetrics                             Anesthesia Physical  Anesthesia Plan  ASA: III  Anesthesia Plan: General   Post-op Pain Management:    Induction: Intravenous  PONV Risk Score and Plan: 3 and Treatment may vary due to age or medical condition and TIVA  Airway Management Planned: LMA  Additional Equipment:   Intra-op Plan:   Post-operative Plan: Extubation in OR  Informed Consent: I have reviewed the patients History and Physical, chart, labs and discussed the procedure including the risks, benefits and alternatives for the proposed anesthesia with the patient or authorized representative who has indicated his/her understanding and acceptance.     Dental Advisory Given  Plan Discussed with: CRNA  Anesthesia Plan Comments:        Anesthesia Quick Evaluation

## 2018-11-10 NOTE — Transfer of Care (Signed)
Immediate Anesthesia Transfer of Care Note  Patient: Rebecca Park  Procedure(s) Performed: AMPUTATION RAY (amputation 5th metatarsal Right foot (Right ) BONE BIOPSY- Right 4th toe (Right )  Patient Location: PACU  Anesthesia Type:General  Level of Consciousness: awake  Airway & Oxygen Therapy: Patient Spontanous Breathing  Post-op Assessment: Report given to RN and Post -op Vital signs reviewed and stable  Post vital signs: Reviewed and stable  Last Vitals:  Vitals Value Taken Time  BP 105/66 11/10/18 1233  Temp    Pulse 89 11/10/18 1234  Resp 25 11/10/18 1234  SpO2 99 % 11/10/18 1234  Vitals shown include unvalidated device data.  Last Pain:  Vitals:   11/10/18 1109  TempSrc: Tympanic  PainSc: 0-No pain         Complications: No apparent anesthesia complications

## 2018-11-10 NOTE — Anesthesia Post-op Follow-up Note (Signed)
Anesthesia QCDR form completed.        

## 2018-11-10 NOTE — Progress Notes (Signed)
[facility] NAME: Rebecca Park    MR#:  [id number]  DATE OF BIRTH:  05/12/1960  SUBJECTIVE:   Patient presented to the hospital due to right foot ulcer/wound and noted to have right foot osteomyelitis.    Seen by podiatry and status post right fifth ray amputation today.  Bone biopsy of the right fourth metatarsal head.  No other acute events overnight.  Patient was noted to be hypokalemic and it was replaced preoperatively.  REVIEW OF SYSTEMS:    Review of Systems  Constitutional: Negative for chills and fever.  HENT: Negative for congestion and tinnitus.   Eyes: Negative for blurred vision and double vision.  Respiratory: Negative for cough, shortness of breath and wheezing.   Cardiovascular: Negative for chest pain, orthopnea and PND.  Gastrointestinal: Negative for abdominal pain, diarrhea, nausea and vomiting.  Genitourinary: Negative for dysuria and hematuria.  Neurological: Negative for dizziness, sensory change and focal weakness.  All other systems reviewed and are negative.   Nutrition: Heart Healthy/Carb control  Tolerating Diet: Yes Tolerating PT: Await Eval      DRUG ALLERGIES:  No Known Allergies  VITALS:  Blood pressure 131/68, pulse 91, temperature 97.6 F (36.4 C), temperature source Oral, resp. rate 19, height 5\' 6"  (1.676 m), weight 96.8 kg, SpO2 95 %.  PHYSICAL EXAMINATION:   Physical Exam  GENERAL:  58 y.o.-year-old obese patient lying in bed in no acute distress.  EYES: Pupils equal, round, reactive to light and accommodation. No scleral icterus. Extraocular muscles intact.  HEENT: Head atraumatic, normocephalic. Oropharynx and nasopharynx clear.  NECK:  Supple, no jugular venous distention. No thyroid enlargement, no tenderness.  LUNGS: Normal breath sounds bilaterally, no wheezing, rales, rhonchi. No use of accessory muscles of respiration.  CARDIOVASCULAR: S1, S2 normal. No murmurs, rubs,  or gallops.  ABDOMEN: Soft, nontender, nondistended. Bowel sounds present. No organomegaly or mass.  EXTREMITIES: No cyanosis, clubbing or edema b/l.    NEUROLOGIC: Cranial nerves II through XII are intact. No focal Motor or sensory deficits b/l.   PSYCHIATRIC: The patient is alert and oriented x 3.  SKIN: No obvious rash, lesion.  Right toes/foot ulcer as shown below         LABORATORY PANEL:   CBC Recent Labs  Lab 11/09/18 0057  WBC 11.8*  HGB 10.6*  HCT 31.4*  PLT 202   ------------------------------------------------------------------------------------------------------------------  Chemistries  Recent Labs  Lab 11/09/18 0057 11/10/18 0354 11/10/18 1034  NA 135  --   --   K 3.4* 2.9* 3.0*  CL 98  --   --   CO2 26  --   --   GLUCOSE 180*  --   --   BUN 25*  --   --   CREATININE 0.96  --   --   CALCIUM 8.6*  --   --   MG  --  1.7  --    ------------------------------------------------------------------------------------------------------------------  Cardiac Enzymes No results for input(s): TROPONINI in the last 168 hours. ------------------------------------------------------------------------------------------------------------------  RADIOLOGY:  US Arterial Abi (screening Lower Extremity)  Result Date: 11/10/2018 CLINICAL DATA:  57 year old female, presents for foot surgery EXAM: NONINVASIVE PHYSIOLOGIC VASCULAR STUDY OF BILATERAL LOWER EXTREMITIES TECHNIQUE: Evaluation of both lower extremities was performed at rest, including calculation of ankle-brachial indices, multiple segmental pressure evaluation, segmental Doppler and segmental pulse volume recording. COMPARISON:  None. FINDINGS: Right ABI:  1.07 Left ABI: 1.09 Right Lower Extremity: Doppler signal at  the right ankle is triphasic. PVR maintained. Left Lower Extremity: Doppler signal at the left ankle is triphasic. PVR maintained. IMPRESSION: Resting ABI of the bilateral lower extremities within normal  limits, with the segmental exam showing no significant arterial occlusive disease. Signed, Dulcy Fanny. Dellia Nims, RPVI [facility] Radiology Electronically Signed   By: Corrie Mckusick D.O.   On: 11/10/2018 07:26     ASSESSMENT AND PLAN:   58 year old female with past medical history of diabetes, hypertension who presents to the hospital due to a fall and also noted to have a right foot fourth and fifth toe ulcer and suspected to have osteomyelitis.  1.  Right foot osteomyelitis- patient has a osteomyelitis of the right fifth toe and metatarsal and right fourth toe and metatarsal. -Continue  IV antibiotics Ceftriaxone, Flagyl.  Seen by podiatry and status post right fifth toe ray amputation and bone biopsy of the right fourth metatarsal head. -Continue pain control and further care as per podiatry.  Follow intraoperative cultures.    2.  Diabetes type 2 without complication- continue carb controlled diet, sliding scale insulin.  Follow blood sugar stable.  3.  Essential hypertension-continue Norvasc.  4.  Hyperlipidemia-continue atorvastatin.  5. Hypokalemia - improved with supplementation and will repeat in a.m  - check Mg. In a.m.   All the records are reviewed and case discussed with Care Management/Social Worker. Management plans discussed with the patient, family and they are in agreement.  CODE STATUS: Full code  DVT Prophylaxis: Lovenox  TOTAL TIME TAKING CARE OF THIS PATIENT: 30 minutes.   POSSIBLE D/C IN 2-3 DAYS, DEPENDING ON CLINICAL CONDITION.   Henreitta Leber M.D on 11/10/2018 at 2:23 PM  Between 7am to 6pm - Pager - [phone]  After 6pm go to [email or web address] - [employer] brewer [facility]  Office  [phone]  CC: Primary care physician; Jearld Fenton, NP

## 2018-11-10 NOTE — Anesthesia Postprocedure Evaluation (Signed)
Anesthesia Post Note  Patient: Rebecca Park  Procedure(s) Performed: AMPUTATION RAY (amputation 5th metatarsal Right foot (Right ) BONE BIOPSY- Right 4th toe (Right )  Patient location during evaluation: PACU Anesthesia Type: General Level of consciousness: awake and alert and oriented Pain management: pain level controlled Vital Signs Assessment: post-procedure vital signs reviewed and stable Respiratory status: spontaneous breathing Cardiovascular status: blood pressure returned to baseline Anesthetic complications: no     Last Vitals:  Vitals:   11/10/18 1421 11/10/18 1515  BP: 131/68 103/62  Pulse: 91 92  Resp: 19 19  Temp: 36.4 C 36.6 C  SpO2: 98% 98%    Last Pain:  Vitals:   11/10/18 1515  TempSrc: Oral  PainSc:                  Hillary Struss

## 2018-11-11 ENCOUNTER — Encounter: Payer: Self-pay | Admitting: Podiatry

## 2018-11-11 LAB — GLUCOSE, CAPILLARY
Glucose-Capillary: 153 mg/dL — ABNORMAL HIGH (ref 70–99)
Glucose-Capillary: 171 mg/dL — ABNORMAL HIGH (ref 70–99)
Glucose-Capillary: 139 mg/dL — ABNORMAL HIGH (ref 70–99)
Glucose-Capillary: 148 mg/dL — ABNORMAL HIGH (ref 70–99)

## 2018-11-11 LAB — CBC
HCT: 30.2 % — ABNORMAL LOW (ref 36.0–46.0)
Hemoglobin: 9.9 g/dL — ABNORMAL LOW (ref 12.0–15.0)
MCH: 29.6 pg (ref 26.0–34.0)
MCHC: 32.8 g/dL (ref 30.0–36.0)
MCV: 90.1 fL (ref 80.0–100.0)
Platelets: 186 10*3/uL (ref 150–400)
RBC: 3.35 MIL/uL — ABNORMAL LOW (ref 3.87–5.11)
RDW: 12.5 % (ref 11.5–15.5)
WBC: 13 10*3/uL — ABNORMAL HIGH (ref 4.0–10.5)
nRBC: 0 % (ref 0.0–0.2)

## 2018-11-11 LAB — BASIC METABOLIC PANEL
Anion gap: 7 (ref 5–15)
BUN: 17 mg/dL (ref 6–20)
CO2: 26 mmol/L (ref 22–32)
Calcium: 8.6 mg/dL — ABNORMAL LOW (ref 8.9–10.3)
Chloride: 104 mmol/L (ref 98–111)
Creatinine, Ser: 0.83 mg/dL (ref 0.44–1.00)
GFR calc Af Amer: >60 mL/min (ref >60)
GFR calc non Af Amer: >60 mL/min (ref >60)
Glucose, Bld: 203 mg/dL — ABNORMAL HIGH (ref 70–99)
Potassium: 3.8 mmol/L (ref 3.5–5.1)
Sodium: 137 mmol/L (ref 135–145)

## 2018-11-11 LAB — MAGNESIUM: Magnesium: 1.6 mg/dL — ABNORMAL LOW (ref 1.7–2.4)

## 2018-11-11 MED ORDER — MAGNESIUM SULFATE 2 GM/50ML IV SOLN
2.0000 g | Freq: Once | INTRAVENOUS | Status: AC
  Administered 2018-11-11: 2 g via INTRAVENOUS
  Filled 2018-11-11: qty 50

## 2018-11-11 NOTE — Progress Notes (Signed)
PODIATRY / FOOT AND ANKLE SURGERY PROGRESS NOTE   HPI: Rebecca Park is a 58 y.o. female who presents status post 1 day right partial fifth ray amputation with bone biopsy of fourth metatarsal head.  Patient states that she is feeling well overall and has minimal pain to the right foot at this time.  Patient has been partial weightbearing with heel contact only in a surgical [place] to the right foot.  Patient feels well overall and denies nausea, vomiting, fever, and chills.  PMHx:  Past Medical History:  Diagnosis Date  . Diabetes mellitus without complication ([place])   . Hypertension     Surgical Hx:  Past Surgical History:  Procedure Laterality Date  . AMPUTATION Right 11/10/2018   Procedure: AMPUTATION RAY (amputation 5th metatarsal Right foot;  Surgeon: Caroline More, DPM;  Location: [facility] ORS;  Service: Podiatry;  Laterality: Right;  . BONE BIOPSY Right 11/10/2018   Procedure: BONE BIOPSY- Right 4th toe;  Surgeon: Caroline More, DPM;  Location: [facility] ORS;  Service: Podiatry;  Laterality: Right;  . BUNIONECTOMY Bilateral   . COLONOSCOPY WITH PROPOFOL N/A 09/07/2018   Procedure: COLONOSCOPY WITH PROPOFOL;  Surgeon: Lin Landsman, MD;  Location: [facility] ENDOSCOPY;  Service: Gastroenterology;  Laterality: N/A;    FHx:  Family History  Problem Relation Age of Onset  . Heart disease Father   . Heart attack Father   . Heart disease Maternal Grandfather     Social History:  reports that she has never smoked. She has never used smokeless tobacco. She reports current alcohol use. She reports that she does not use drugs.  Allergies: No Known Allergies  Review of Systems: General ROS: negative Psychological ROS: negative Respiratory ROS: no cough, shortness of breath, or wheezing Cardiovascular ROS: no chest pain or dyspnea on exertion Musculoskeletal ROS: positive for - joint swelling Neurological ROS: positive for - numbness/tingling Dermatological ROS: positive for Incision right  lateral foot  Medications Prior to Admission  Medication Sig Dispense Refill  . amLODipine (NORVASC) 5 MG tablet TAKE 1 TABLET BY MOUTH EVERY DAY (Patient taking differently: Take 5 mg by mouth daily. ) 90 tablet 0  . atorvastatin (LIPITOR) 20 MG tablet Take 1 tablet (20 mg total) by mouth daily. 90 tablet 3  . glipiZIDE (GLUCOTROL) 10 MG tablet Take 1 tablet (10 mg total) by mouth daily before breakfast. 60 tablet 2  . hydrochlorothiazide (HYDRODIURIL) 25 MG tablet Take 1 tablet (25 mg total) by mouth daily. 90 tablet 3  . losartan (COZAAR) 50 MG tablet TAKE 2 TABLETS BY MOUTH EVERY DAY (Patient taking differently: Take 100 mg by mouth daily. ) 180 tablet 0  . Multiple Vitamins-Minerals (WOMENS MULTIVITAMIN PO) Take 1 tablet by mouth daily.     . Vitamin D, Ergocalciferol, (DRISDOL) 1.25 MG (50000 UT) CAPS capsule Take 1 capsule (50,000 Units total) by mouth every 7 (seven) days. 12 capsule 0    Physical Exam: General: Alert and oriented.  No apparent distress. Postop dressing intact to right foot, no strikethrough.  Results for orders placed or performed during the hospital encounter of 11/08/18 (from the past 48 hour(s))  Glucose, capillary     Status: Abnormal   Collection Time: 11/09/18  7:06 PM  Result Value Ref Range   Glucose-Capillary 162 (H) 70 - 99 mg/dL  Glucose, capillary     Status: Abnormal   Collection Time: 11/09/18  8:59 PM  Result Value Ref Range   Glucose-Capillary 169 (H) 70 - 99 mg/dL  Potassium     Status: Abnormal   Collection Time: 11/10/18  3:54 AM  Result Value Ref Range   Potassium 2.9 (L) 3.5 - 5.1 mmol/L    Comment: Performed at [facility], [address] [place], [place] [address]  Magnesium     Status: None   Collection Time: 11/10/18  3:54 AM  Result Value Ref Range   Magnesium 1.7 1.7 - 2.4 mg/dL    Comment: Performed at [facility], [address] [place], [place] [address]  Surgical pcr screen     Status: None    Collection Time: 11/10/18  7:24 AM   Specimen: Nasal Mucosa; Nasal Swab  Result Value Ref Range   MRSA, PCR NEGATIVE NEGATIVE   Staphylococcus aureus NEGATIVE NEGATIVE    Comment: (NOTE) The Xpert SA Assay (FDA approved for NASAL specimens in patients 25 years of age and older), is one component of a comprehensive surveillance program. It is not intended to diagnose infection nor to guide or monitor treatment. Performed at [facility], [address] [place], [place] [address]   Glucose, capillary     Status: Abnormal   Collection Time: 11/10/18  7:41 AM  Result Value Ref Range   Glucose-Capillary 155 (H) 70 - 99 mg/dL  Potassium     Status: Abnormal   Collection Time: 11/10/18 10:34 AM  Result Value Ref Range   Potassium 3.0 (L) 3.5 - 5.1 mmol/L    Comment: Performed at [facility], [address] [place], [place] [address]  Glucose, capillary     Status: Abnormal   Collection Time: 11/10/18 11:16 AM  Result Value Ref Range   Glucose-Capillary 151 (H) 70 - 99 mg/dL  Aerobic Culture (superficial specimen)     Status: None (Preliminary result)   Collection Time: 11/10/18 11:53 AM   Specimen: Foot; Wound  Result Value Ref Range   Specimen Description      FOOT Performed at [facility], [address] [place], [place] [address]    Special Requests      Normal Performed at [facility], [place], [place] [address]    Gram Stain      RARE WBC PRESENT, PREDOMINANTLY PMN ABUNDANT GRAM POSITIVE COCCI MODERATE GRAM NEGATIVE RODS FEW GRAM POSITIVE RODS    Culture      TOO YOUNG TO READ Performed at [facility], [place] [address], [place], [place] [address]    Report Status PENDING   Aerobic/Anaerobic Culture (surgical/deep wound)     Status: None (Preliminary result)   Collection Time: 11/10/18 11:53 AM   Specimen: Bone; Tissue  Result Value Ref Range   Specimen Description      BONE Performed at [facility], [address] [place], [place] [address]    Special Requests      NONE Performed at [facility], [place], [place] [address]    Gram Stain NO WBC SEEN NO ORGANISMS SEEN     Culture      NO GROWTH < 24 HOURS Performed at [facility], [place] [address], [place], [place] [address]    Report Status PENDING   Glucose, capillary     Status: Abnormal   Collection Time: 11/10/18 12:43 PM  Result Value Ref Range   Glucose-Capillary 129 (H) 70 - 99 mg/dL  Glucose, capillary     Status: Abnormal   Collection Time: 11/10/18  1:44 PM  Result Value Ref Range   Glucose-Capillary 135 (H) 70 - 99 mg/dL  Glucose, capillary     Status: Abnormal   Collection Time: 11/10/18  4:31 PM  Result Value Ref Range   Glucose-Capillary 200 (H) 70 - 99 mg/dL  Glucose, capillary     Status: Abnormal   Collection Time: 11/10/18  9:53 PM  Result Value Ref Range   Glucose-Capillary 185 (H) 70 - 99 mg/dL  CBC     Status: Abnormal   Collection Time: 11/11/18  3:56 AM  Result Value Ref Range   WBC 13.0 (H) 4.0 - 10.5 K/uL   RBC 3.35 (L) 3.87 - 5.11 MIL/uL   Hemoglobin 9.9 (L) 12.0 - 15.0 g/dL   HCT 30.2 (L) 36.0 - 46.0 %   MCV 90.1 80.0 - 100.0 fL   MCH 29.6 26.0 - 34.0 pg   MCHC 32.8 30.0 - 36.0 g/dL   RDW 12.5 11.5 - 15.5 %   Platelets 186 150 - 400 K/uL   nRBC 0.0 0.0 - 0.2 %    Comment: Performed at [facility], [address] [place], [place] [address]  Basic metabolic panel     Status: Abnormal   Collection Time: 11/11/18  3:56 AM  Result Value Ref Range   Sodium 137 135 - 145 mmol/L   Potassium 3.8 3.5 - 5.1 mmol/L   Chloride 104 98 - 111 mmol/L   CO2 26 22 - 32 mmol/L   Glucose, Bld 203 (H) 70 - 99 mg/dL   BUN 17 6 - 20 mg/dL   Creatinine, Ser 0.83 0.44 - 1.00 mg/dL   Calcium 8.6 (L) 8.9 - 10.3 mg/dL   GFR calc non Af Amer >60 >60 mL/min   GFR calc Af Amer >60 >60 mL/min   Anion gap 7 5 - 15    Comment: Performed at [facility], [address] [place], [place] [address]  Magnesium     Status: Abnormal   Collection Time: 11/11/18  3:56 AM  Result Value Ref Range   Magnesium 1.6 (L) 1.7 - 2.4 mg/dL    Comment: Performed at [facility], [place], [place] [address]  Glucose, capillary     Status: Abnormal   Collection Time: 11/11/18  9:00 AM  Result Value Ref Range   Glucose-Capillary 153 (H) 70 - 99 mg/dL   Comment 1 Notify RN   Glucose, capillary     Status: Abnormal   Collection Time: 11/11/18 11:57 AM  Result Value Ref Range   Glucose-Capillary 171 (H) 70 - 99 mg/dL   Comment 1 Notify RN    US Arterial Abi (screening Lower Extremity)  Result Date: 11/10/2018 CLINICAL DATA:  58 year old female, presents for foot surgery EXAM: NONINVASIVE PHYSIOLOGIC VASCULAR STUDY OF BILATERAL LOWER EXTREMITIES TECHNIQUE: Evaluation of both lower extremities was performed at rest, including calculation of ankle-brachial indices, multiple segmental pressure evaluation, segmental Doppler and segmental pulse volume recording. COMPARISON:  None. FINDINGS: Right ABI:  1.07 Left ABI: 1.09 Right Lower Extremity: Doppler signal at the right ankle is triphasic. PVR maintained. Left Lower Extremity: Doppler signal at the left ankle is triphasic. PVR maintained. IMPRESSION: Resting ABI of the bilateral lower extremities within normal limits, with the segmental exam showing no significant arterial occlusive disease. Signed, Dulcy Fanny. Dellia Nims, RPVI [facility] Electronically Signed   By: Corrie Mckusick D.O.   On: 11/10/2018 07:26   Dg Foot 2  Views Right  Result Date: 11/10/2018 CLINICAL DATA:  Status post right fifth toe amputation. EXAM: RIGHT FOOT - 2 VIEW COMPARISON:  Radiographs of October 16, 2018. FINDINGS: Status post surgical amputation of the distal fifth metatarsal and phalanges. No radiopaque foreign body is noted. Mild posterior calcaneal  spurring is noted. Joint spaces are unremarkable. IMPRESSION: Status post surgical amputation of the distal fifth metatarsal and phalanges. Electronically Signed   By: Marijo Conception M.D.   On: 11/10/2018 14:24    Blood pressure 135/72, pulse 91, temperature 98.8 F (37.1 C), temperature source Axillary, resp. rate 18, height 5\' 6"  (1.676 m), weight 96.8 kg, SpO2 100 %.   Assessment 1. 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 Cellulitis right foot 3. Diabetes type 2 with polyneuropathy 4. Ulceration right foot with fat layer and bone exposed  Plan -Dressing intact with no strikethrough. -Appreciate recommendations for antibiotic therapy at this time.  Likely can transition from IV to oral antibiotics based on culture results.  We will continue to watch bone biopsy culture which has no growth to date still of the remaining fourth metatarsal head. -Wound culture from operative procedure producing Morganella, strep G, and Klebsiella.  Appreciate antibiotic recommendations per medicine team.  White blood cell count down from admission from 14.5 to 13.  Downtrending. -Appreciate recommendations per PT.  Continue partial weightbearing with right heel contact in a surgical [place] at all times.  Try to be on foot for transfers only and stay off the foot as much as possible. -Keep dressings clean, dry, and intact.  If patient is still admitted on Monday will change dressing and reexamine incision site. -We will continue to monitor culture results until discharge.  Caroline More 11/11/2018, 3:43 PM

## 2018-11-11 NOTE — Progress Notes (Signed)
Patient ID: Rebecca Park, female   DOB: [birth year], 58 y.o.   MRN: [id number]  [employer] PROGRESS NOTE  Rebecca Park DOB: 1960-05-08 DOA: 11/08/2018 PCP: Jearld Fenton, NP  HPI/Subjective: Patient feeling a little of nausea.  Yesterday after the surgery she felt okay but late last night and early this morning a lot of nauseousness.  No pain in the foot.  Not taking any pain medications.  Objective: Vitals:   11/10/18 2344 11/11/18 0831  BP: (!) 147/73 (!) 159/79  Pulse: (!) 108 97  Resp: 18 19  Temp: 98.3 F (36.8 C) 98.9 F (37.2 C)  SpO2: 96%     Filed Weights   11/09/18 0100  Weight: 96.8 kg    ROS: Review of Systems  Constitutional: Negative for chills and fever.  Eyes: Negative for blurred vision.  Respiratory: Negative for cough and shortness of breath.   Cardiovascular: Negative for chest pain.  Gastrointestinal: Negative for abdominal pain, constipation, diarrhea, nausea and vomiting.  Genitourinary: Negative for dysuria.  Musculoskeletal: Negative for joint pain.  Neurological: Negative for dizziness and headaches.   Exam: Physical Exam  Constitutional: She is oriented to person, place, and time.  HENT:  Nose: No mucosal edema.  Mouth/Throat: No oropharyngeal exudate or posterior oropharyngeal edema.  Eyes: Pupils are equal, round, and reactive to light. Conjunctivae, EOM and lids are normal.  Neck: No JVD present. Carotid bruit is not present. No edema present. No thyroid mass and no thyromegaly present.  Cardiovascular: S1 normal and S2 normal. Exam reveals no gallop.  No murmur heard. Pulses:      Dorsalis pedis pulses are 2+ on the right side and 2+ on the left side.  Respiratory: No respiratory distress. She has no wheezes. She has no rhonchi. She has no rales.  GI: Soft. Bowel sounds are normal. There is no abdominal tenderness.  Musculoskeletal:     Right ankle: She exhibits swelling.     Left ankle: She exhibits no swelling.   Lymphadenopathy:    She has no cervical adenopathy.  Neurological: She is alert and oriented to person, place, and time. No cranial nerve deficit.  Skin: Skin is warm. No rash noted. Nails show no clubbing.  Psychiatric: She has a normal mood and affect.      Data Reviewed: Basic Metabolic Panel: Recent Labs  Lab 11/07/18 0946 11/08/18 1601 11/09/18 0057 11/10/18 0354 11/10/18 1034 11/11/18 0356  NA  --  129* 135  --   --  137  K  --  2.9* 3.4* 2.9* 3.0* 3.8  CL  --  89* 98  --   --  104  CO2  --  25 26  --   --  26  GLUCOSE  --  217* 180*  --   --  203*  BUN  --  29* 25*  --   --  17  CREATININE 1.40* 1.09* 0.96  --   --  0.83  CALCIUM  --  9.4 8.6*  --   --  8.6*  MG  --   --   --  1.7  --  1.6*   CBC: Recent Labs  Lab 11/08/18 1601 11/09/18 0057 11/11/18 0356  WBC 14.5* 11.8* 13.0*  HGB 11.7* 10.6* 9.9*  HCT 34.3* 31.4* 30.2*  MCV 86.0 86.7 90.1  PLT 249 202 186    CBG: Recent Labs  Lab 11/10/18 1344 11/10/18 1631 11/10/18 2153 11/11/18 0900 11/11/18 1157  GLUCAP 135* 200*  185* 153* 171*    Recent Results (from the past 240 hour(s))  Aerobic Culture (superficial specimen)     Status: None   Collection Time: 11/06/18 11:15 AM   Specimen: Toe  Result Value Ref Range Status   Specimen Description   Final    TOE WEB BETWEEN 4TH AND 5TH TOE RIGHT FOOT Performed at [facility], [address], [place], [place] [address]    Special Requests   Final    NONE Performed at [facility], [address] [place], [place] [address]    Gram Stain   Final    ABUNDANT WBC PRESENT, PREDOMINANTLY PMN ABUNDANT GRAM POSITIVE COCCI ABUNDANT GRAM NEGATIVE RODS MODERATE GRAM POSITIVE RODS Performed at [facility], [place] [address], [place], [place] [address]    Culture   Final    ABUNDANT MORGANELLA MORGANII MODERATE KLEBSIELLA OXYTOCA FEW STREPTOCOCCUS GROUP G    Report Status 11/09/2018 FINAL  Final  Culture, blood (routine x 2)      Status: None (Preliminary result)   Collection Time: 11/08/18  6:39 PM   Specimen: BLOOD  Result Value Ref Range Status   Specimen Description BLOOD BLOOD RIGHT FOREARM  Final   Special Requests   Final    BOTTLES DRAWN AEROBIC AND ANAEROBIC Blood Culture results may not be optimal due to an inadequate volume of blood received in culture bottles   Culture   Final    NO GROWTH 3 DAYS Performed at [facility], [address] [place],  [address]    Report Status PENDING  Incomplete  SARS Coronavirus 2 [facility] order, Performed in [place] hospital lab) Nasopharyngeal Nasopharyngeal Swab     Status: None   Collection Time: 11/08/18  6:39 PM   Specimen: Nasopharyngeal Swab  Result Value Ref Range Status   SARS Coronavirus 2 NEGATIVE NEGATIVE Final    Comment: (NOTE) If result is NEGATIVE SARS-CoV-2 target nucleic acids are NOT DETECTED. The SARS-CoV-2 RNA is generally detectable in upper and lower  respiratory specimens during the acute phase of infection. The lowest  concentration of SARS-CoV-2 viral copies this assay can detect is 250  copies / mL. A negative result does not preclude SARS-CoV-2 infection  and should not be used as the sole basis for treatment or other  patient management decisions.  A negative result may occur with  improper specimen collection / handling, submission of specimen other  than nasopharyngeal swab, presence of viral mutation(s) within the  areas targeted by this assay, and inadequate number of viral copies  (<250 copies / mL). A negative result must be combined with clinical  observations, patient history, and epidemiological information. If result is POSITIVE SARS-CoV-2 target nucleic acids are DETECTED. The SARS-CoV-2 RNA is generally detectable in upper and lower  respiratory specimens dur ing the acute phase of infection.  Positive  results are indicative of active infection with SARS-CoV-2.  Clinical  correlation with  patient history and other diagnostic information is  necessary to determine patient infection status.  Positive results do  not rule out bacterial infection or co-infection with other viruses. If result is PRESUMPTIVE POSTIVE SARS-CoV-2 nucleic acids MAY BE PRESENT.   A presumptive positive result was obtained on the submitted specimen  and confirmed on repeat testing.  While 2019 novel coronavirus  (SARS-CoV-2) nucleic acids may be present in the submitted sample  additional confirmatory testing may be necessary for epidemiological  and / or clinical management purposes  to  differentiate between  SARS-CoV-2 and other Sarbecovirus currently known to infect humans.  If clinically indicated additional testing with an alternate test  methodology [phone]) is advised. The SARS-CoV-2 RNA is generally  detectable in upper and lower respiratory sp ecimens during the acute  phase of infection. The expected result is Negative. Fact Sheet for Patients:  StrictlyIdeas.no Fact Sheet for Healthcare Providers: BankingDealers.co.za This test is not yet approved or cleared by the [place] FDA and has been authorized for detection and/or diagnosis of SARS-CoV-2 by FDA under an Emergency Use Authorization (EUA).  This EUA will remain in effect (meaning this test can be used) for the duration of the COVID-19 declaration under Section 564(b)(1) of the Act, 21 U.S.C. section 360bbb-3(b)(1), unless the authorization is terminated or revoked sooner. Performed at [facility], [address] [place], [place] [address]   Culture, blood (routine x 2)     Status: None (Preliminary result)   Collection Time: 11/08/18  6:40 PM   Specimen: BLOOD  Result Value Ref Range Status   Specimen Description BLOOD LEFT ANTECUBITAL  Final   Special Requests   Final    BOTTLES DRAWN AEROBIC AND ANAEROBIC Blood Culture results may not be optimal due to an  excessive volume of blood received in culture bottles   Culture   Final    NO GROWTH 3 DAYS Performed at [facility], [address] [place], [place] [address]    Report Status PENDING  Incomplete  Surgical pcr screen     Status: None   Collection Time: 11/10/18  7:24 AM   Specimen: Nasal Mucosa; Nasal Swab  Result Value Ref Range Status   MRSA, PCR NEGATIVE NEGATIVE Final   Staphylococcus aureus NEGATIVE NEGATIVE Final    Comment: (NOTE) The Xpert SA Assay (FDA approved for NASAL specimens in patients 48 years of age and older), is one component of a comprehensive surveillance program. It is not intended to diagnose infection nor to guide or monitor treatment. Performed at [facility], [address] [place], [place] [address]   Aerobic Culture (superficial specimen)     Status: None (Preliminary result)   Collection Time: 11/10/18 11:53 AM   Specimen: Foot; Wound  Result Value Ref Range Status   Specimen Description   Final    FOOT Performed at [facility], [address] [place], [place] [address]    Special Requests   Final    Normal Performed at [facility], [place], [place] [address]    Gram Stain   Final    RARE WBC PRESENT, PREDOMINANTLY PMN ABUNDANT GRAM POSITIVE COCCI MODERATE GRAM NEGATIVE RODS FEW GRAM POSITIVE RODS    Culture   Final    TOO YOUNG TO READ Performed at [facility], [place] [address], [place], [place] [address]    Report Status PENDING  Incomplete  Aerobic/Anaerobic Culture (surgical/deep wound)     Status: None (Preliminary result)   Collection Time: 11/10/18 11:53 AM   Specimen: Bone; Tissue  Result Value Ref Range Status   Specimen Description   Final    BONE Performed at [facility], [address] [place], [place] [address]    Special Requests   Final    NONE Performed at [facility], [address] [place],  [address]    Gram Stain  NO WBC SEEN NO ORGANISMS SEEN   Final   Culture   Final    NO  GROWTH < 24 HOURS Performed at [facility], [place] [address] [place], [place] [address]    Report Status PENDING  Incomplete     Studies: US Arterial Burnard Bunting (screening Lower Extremity)  Result Date: 11/10/2018 CLINICAL DATA:  58 year old female, presents for foot surgery EXAM: NONINVASIVE PHYSIOLOGIC VASCULAR STUDY OF BILATERAL LOWER EXTREMITIES TECHNIQUE: Evaluation of both lower extremities was performed at rest, including calculation of ankle-brachial indices, multiple segmental pressure evaluation, segmental Doppler and segmental pulse volume recording. COMPARISON:  None. FINDINGS: Right ABI:  1.07 Left ABI: 1.09 Right Lower Extremity: Doppler signal at the right ankle is triphasic. PVR maintained. Left Lower Extremity: Doppler signal at the left ankle is triphasic. PVR maintained. IMPRESSION: Resting ABI of the bilateral lower extremities within normal limits, with the segmental exam showing no significant arterial occlusive disease. Signed, Dulcy Fanny. Dellia Nims, RPVI [facility] Electronically Signed   By: Corrie Mckusick D.O.   On: 11/10/2018 07:26   Dg Foot 2 Views Right  Result Date: 11/10/2018 CLINICAL DATA:  Status post right fifth toe amputation. EXAM: RIGHT FOOT - 2 VIEW COMPARISON:  Radiographs of October 16, 2018. FINDINGS: Status post surgical amputation of the distal fifth metatarsal and phalanges. No radiopaque foreign body is noted. Mild posterior calcaneal spurring is noted. Joint spaces are unremarkable. IMPRESSION: Status post surgical amputation of the distal fifth metatarsal and phalanges. Electronically Signed   By: Marijo Conception M.D.   On: 11/10/2018 14:24    Scheduled Meds: . amLODipine  5 mg Oral Daily  . atorvastatin  20 mg Oral Daily  . enoxaparin (LOVENOX) injection  40 mg Subcutaneous Q24H  . insulin aspart  0-15 Units Subcutaneous TID WC  .  insulin aspart  0-5 Units Subcutaneous QHS  . multivitamin-lutein  1 capsule Oral Daily  . Ensure Max Protein  11 oz Oral BID BM  . sodium chloride flush  3 mL Intravenous Q12H  . [START ON 11/12/2018] Vitamin D (Ergocalciferol)  50,000 Units Oral Q7 days   Continuous Infusions: . sodium chloride 250 mL (11/09/18 2017)  . sodium chloride 50 mL/hr at 11/10/18 2157  . cefTRIAXone (ROCEPHIN)  IV 2 g (11/10/18 2158)  . metronidazole 500 mg (11/11/18 1211)    Assessment/Plan:  1. Right foot osteomyelitis.  Patient had partial fifth ray amputation and bone biopsy of the fourth metatarsal head.  Patient on Rocephin and Flagyl.  Initial culture of Morganella, strep G and Klebsiella.  Follow-up intraoperative reports especially the bone biopsy of the right fourth metatarsal head.  This would determine whether or not further surgical procedure versus IV antibiotics or oral antibiotics. 2. Nausea likely secondary to anesthesia 3. Type 2 diabetes mellitus with complication of foot infection and osteomyelitis.  Patient on sliding scale insulin.  Hemoglobin A1c 6.7 under good control.  Patient on glipizide at home.  We will have to make sure that she is eating better before starting this. 4. Hypertension on Norvasc 5. Hyperlipidemia on Lipitor 6. Hypomagnesemia.  Gave IV magnesium today 7. Anemia.  Continue to monitor.  Code Status:     Code Status Orders  (From admission, onward)         Start     Ordered   11/09/18 0007  Full code  Continuous     11/09/18 0006        Code Status History    This patient has a current code status but no historical code status.  Advance Care Planning Activity     Family Communication: Spoke with spouse at the bedside Disposition Plan: To be determined based on podiatry follow-up  Consultants:  Podiatry  Procedures:  Partial fifth ray amputation  Antibiotics:  Rocephin  Flagyl  Time spent: 28 minutes  [place]

## 2018-11-11 NOTE — Evaluation (Signed)
Physical Therapy Evaluation Patient Details Name: Rebecca Park MRN: [id number] DOB: [birth year] Today's Date: 11/11/2018   History of Present Illness  Rebecca Park is a 13yoF who comes to [facility] on 8/12 with nausea and weakness for 3-4weeks, found to have osteomyelitis in right foot from gardening injur in July. 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  Clinical Impression  Pt admitted with above diagnosis. Pt currently with functional limitations due to the deficits listed below (see "PT Problem List"). Upon entry, pt in bed, awake and agreeable to participate. The pt is alert and oriented x4, pleasant, conversational, and generally a good historian. Bed mobility performed well, education and setup needed for transfers; AMB is performed without physical assist or LOB, but pt is easily fatigued and has strength limitations. Pt able to ascend 3 stairs with RW without rail use. Discussed potential for kneeling scooter use at DC. Functional mobility assessment demonstrates increased effort/time requirements, poor tolerance, but no frank need for physical assistance, whereas the patient performed these at a higher level of independence PTA. Pt will benefit from skilled PT intervention to increase independence and safety with basic mobility in preparation for discharge to the venue listed below.       Follow Up Recommendations Home health PT    Equipment Recommendations  Standard walker    Recommendations for Other Services       Precautions / Restrictions Precautions Precautions: Fall Restrictions Weight Bearing Restrictions: Yes RLE Weight Bearing: Partial weight bearing RLE Partial Weight Bearing Percentage or Pounds: Heel weight bearing only in ortho wedge [place]      Mobility  Bed Mobility Overal bed mobility: Modified Independent                Transfers Overall transfer level: Modified independent Equipment used: Rolling walker (2  wheeled)             General transfer comment: educated on RW use and TTWB techniques; heavy effort needed, but no physical assist provided  Ambulation/Gait Ambulation/Gait assistance: Supervision;Min guard Gait Distance (Feet): 40 Feet Assistive device: Rolling walker (2 wheeled)       General Gait Details: NWB RLE, Left hop-to gait with RW. Oerthowedge Yeley not yet in room, hence evaluated NWB AMB capacity. Pt requires 30-sec standing rest breaks Q94f  Stairs Stairs: Yes Stairs assistance: Min guard Stair Management: With walker;Backwards Number of Stairs: 3 General stair comments: cues to perform NWB, but pt performs with PWB on RLE; attempted posterior scoot up first, which was unsuccessful  Wheelchair Mobility    Modified Rankin (Stroke Patients Only)       Balance Overall balance assessment: Modified Independent;Mild deficits observed, not formally tested                                           Pertinent Vitals/Pain Pain Assessment: No/denies pain    Home Living Family/patient expects to be discharged to:: Private residence Living Arrangements: Spouse/significant other Available Help at Discharge: Family(mother also availabel to assist)   Home Access: Stairs to enter Entrance Stairs-Rails: Right;Left;Can reach both Entrance Stairs-Number of Steps: 3 Home Layout: One level Home Equipment: Cane - single point;Shower seat - built in      Prior Function Level of Independence: Independent               HJournalist, newspaper  Dominant Hand: Right    Extremity/Trunk Assessment   Upper Extremity Assessment Upper Extremity Assessment: Overall WFL for tasks assessed;Generalized weakness    Lower Extremity Assessment Lower Extremity Assessment: Overall WFL for tasks assessed;Generalized weakness       Communication      Cognition Arousal/Alertness: Awake/alert Behavior During Therapy: WFL for tasks assessed/performed Overall  Cognitive Status: Within Functional Limits for tasks assessed                                        General Comments      Exercises     Assessment/Plan    PT Assessment Patient needs continued PT services  PT Problem List Decreased strength;Decreased activity tolerance;Decreased mobility;Decreased knowledge of use of DME;Decreased knowledge of precautions       PT Treatment Interventions Gait training;Stair training;DME instruction;Functional mobility training;Therapeutic activities;Therapeutic exercise;Patient/family education    PT Goals (Current goals can be found in the Care Plan section)  Acute Rehab PT Goals Patient Stated Goal: return to home and improve AMB PT Goal Formulation: With patient Time For Goal Achievement: 11/25/18 Potential to Achieve Goals: Good    Frequency 7X/week   Barriers to discharge        Co-evaluation               AM-PAC PT "6 Clicks" Mobility  Outcome Measure Help needed turning from your back to your side while in a flat bed without using bedrails?: None Help needed moving from lying on your back to sitting on the side of a flat bed without using bedrails?: None Help needed moving to and from a bed to a chair (including a wheelchair)?: A Little Help needed standing up from a chair using your arms (e.g., wheelchair or bedside chair)?: A Little Help needed to walk in hospital room?: A Little Help needed climbing 3-5 steps with a railing? : A Little 6 Click Score: 20    End of Session Equipment Utilized During Treatment: Gait belt Activity Tolerance: Patient tolerated treatment well;No increased pain;Patient limited by fatigue Patient left: in chair;with family/visitor present;with call bell/phone within reach Nurse Communication: Mobility status PT Visit Diagnosis: Other abnormalities of gait and mobility (R26.89);Difficulty in walking, not elsewhere classified (R26.2)    Time: 7158-0638 PT Time Calculation  (min) (ACUTE ONLY): 42 min   Charges:   PT Evaluation $PT Eval Low Complexity: 1 Low PT Treatments $Gait Training: 23-37 mins        11:44 AM, 11/11/18 Etta Grandchild, PT, DPT Physical Therapist - [facility]  [phone] ([place])    W[place] C 11/11/2018, 11:42 AM

## 2018-11-12 LAB — GLUCOSE, CAPILLARY: Glucose-Capillary: 159 mg/dL — ABNORMAL HIGH (ref 70–99)

## 2018-11-12 MED ORDER — ENSURE MAX PROTEIN PO LIQD: 11.0000 [oz_av] | Freq: Two times a day (BID) | ORAL | 0 refills | Status: DC

## 2018-11-12 MED ORDER — PENICILLIN V POTASSIUM 500 MG PO TABS
500.0000 mg | ORAL_TABLET | Freq: Four times a day (QID) | ORAL | Status: DC
  Filled 2018-11-12 (×2): qty 1

## 2018-11-12 MED ORDER — LEVOFLOXACIN 500 MG PO TABS: 750.0000 mg | ORAL_TABLET | Freq: Every day | ORAL | Status: DC

## 2018-11-12 MED ORDER — LOSARTAN POTASSIUM 50 MG PO TABS: 100.0000 mg | ORAL_TABLET | Freq: Every day | ORAL | Status: DC

## 2018-11-12 MED ORDER — LEVOFLOXACIN 750 MG PO TABS: 750.0000 mg | ORAL_TABLET | Freq: Every day | ORAL | 0 refills | Status: DC

## 2018-11-12 MED ORDER — PENICILLIN V POTASSIUM 500 MG PO TABS: 500.0000 mg | ORAL_TABLET | Freq: Four times a day (QID) | ORAL | 0 refills | Status: DC

## 2018-11-12 MED ORDER — PENICILLIN V POTASSIUM 125 MG/5ML PO SOLR
500.0000 mg | Freq: Four times a day (QID) | ORAL | Status: DC
  Filled 2018-11-12: qty 20

## 2018-11-12 NOTE — Progress Notes (Signed)
Patient is being discharged from unit today to home with family. Dr. Luana Shu in to change dressing to right foot surgical site. Dressing will remain intact until patient sees physician in office.  All personal belongings with patient. No distress noted.  All discharge instructions reviewed with patient including meds, next meds due follow up appts and guidelines for care.

## 2018-11-12 NOTE — Discharge Instructions (Signed)
Podiatry discharge instructions: Keep surgical dressings clean, dry, and intact until postoperative visit. Partial weightbearing at all times to the right foot in a surgical Brittle with heel contact only.  Try to stay off of the right foot as much as possible to limit the complications of wound healing from your amputation site.  Okay to use walker or wheelchair to get from place to place. Take antibiotics and pain medication as prescribed if prescribed. Follow-up within 1 week of discharge date for dressing change and wound reexamination.  Instructions placed on chart to make appointment at [facility] clinic with Dr. Luana Shu.  Call to make appointment.  Osteomyelitis, Adult  Bone infections (osteomyelitis) occur when bacteria or other germs get inside a bone. This can happen if you have an infection in another part of your body that spreads through your blood. Germs from your skin or from outside of your body can also cause this type of infection if you have a wound or a broken bone (fracture) that breaks the skin. Bone infections need to be treated quickly to prevent bone damage and to prevent the infection from spreading to other areas of your body. What are the causes? Most bone infections are caused by bacteria. They can also be caused by other germs, such as viruses and funguses. What increases the risk? You are more likely to develop this condition if you:  Recently had surgery, especially bone or joint surgery.  Have a long-term (chronic) disease, such as: ? Diabetes. ? HIV (human immunodeficiency virus). ? Rheumatoid arthritis. ? Sickle cell anemia. ? Kidney disease that requires dialysis.  Are aged 58 years or older.  Have a condition or take medicines that block or weaken your body's defense system (immune system).  Have a condition that reduces your blood flow.  Have an artificial joint.  Have had a joint or bone repaired with plates or screws (surgical hardware).  Use IV  drugs.  Have a central line for IV access.  Have had trauma, such as stepping on a nail or a broken bone that came through the skin. What are the signs or symptoms? Symptoms vary depending on the type and location of your infection. Common symptoms of bone infections include:  Fever and chills.  Skin redness and warmth.  Swelling.  Pain and stiffness.  Drainage of fluid or pus near the infection. How is this diagnosed? This condition may be diagnosed based on:  Your symptoms and medical history.  A physical exam.  Tests, such as: ? A sample of tissue, fluid, or blood taken to be examined under a microscope. ? Pus or discharge swabbed from a wound for testing to identify germs and to determine what type of medicine will kill them (culture and sensitivity). ? Blood tests.  Imaging studies. These may include: ? X-rays. ? MRI. ? CT scan. ? Bone scan. ? Ultrasound. How is this treated? Treatment for this condition depends on the cause and type of infection. Antibiotic medicines are usually the first treatment for a bone infection. This may be done in a hospital at first. You may have to continue IV antibiotics at home or take antibiotics by mouth for several weeks after that. Other treatments may include surgery to remove:  Dead or dying tissue from a bone.  An infected artificial joint.  Infected plates or screws that were used to repair a broken bone. Follow these instructions at home: Medicines   Take over-the-counter and prescription medicines only as told by your health care  provider.  Take your antibiotic medicine as told by your health care provider. Do not stop taking the antibiotic even if you start to feel better.  Follow instructions from your health care provider about how to take IV antibiotics at home. You may need to have a nurse come to your home to give you the IV antibiotics. General instructions   Ask your health care provider if you have any  restrictions on your activities.  If directed, put ice on the affected area: ? Put ice in a plastic bag. ? Place a towel between your skin and the bag. ? Leave the ice on for 20 minutes, 2-3 times a day.  Wash your hands often with soap and water. If soap and water are not available, use hand sanitizer.  Do not use any products that contain nicotine or tobacco, such as cigarettes and e-cigarettes. These can delay bone healing. If you need help quitting, ask your health care provider.  Keep all follow-up visits as told by your health care provider. This is important. Contact a health care provider if:  You develop a fever or chills.  You have redness, warmth, pain, or swelling that returns after treatment. Get help right away if:  You have rapid breathing or you have trouble breathing.  You have chest pain.  You cannot drink fluids or make urine.  The affected area swells, changes color, or turns blue.  You have numbness or severe pain in the affected area. Summary  Bone infections (osteomyelitis) occur when bacteria or other germs get inside a bone.  You may be more likely to get this type of infection if you have a condition, such as diabetes, that lowers your ability to fight infection or increases your chances of getting an infection.  Most bone infections are caused by bacteria. They can also be caused by other germs, such as viruses and funguses.  Treatment for this condition usually starts with taking antibiotics. Further treatment depends on the cause and type of infection. This information is not intended to replace advice given to you by your health care provider. Make sure you discuss any questions you have with your health care provider. Document Released: 03/15/2005 Document Revised: 03/31/2017 Document Reviewed: 03/24/2017 Elsevier Patient Education  2020 [employer].

## 2018-11-12 NOTE — Progress Notes (Signed)
PODIATRY / FOOT AND ANKLE SURGERY PROGRESS NOTE   HPI: Rebecca Park is a 58 y.o. female who presents status post 2 days right partial fifth ray amputation with bone biopsy of fourth metatarsal head.  Patient states that she is feeling well overall and has minimal pain to the right foot at this time.  Patient has been partial weightbearing with heel contact only in a surgical Stormer to the right foot.  Patient feels well overall and denies nausea, vomiting, fever, and chills.  PMHx:  Past Medical History:  Diagnosis Date  . Diabetes mellitus without complication ([place])   . Hypertension     Surgical Hx:  Past Surgical History:  Procedure Laterality Date  . AMPUTATION Right 11/10/2018   Procedure: AMPUTATION RAY (amputation 5th metatarsal Right foot;  Surgeon: Caroline More, DPM;  Location: [facility] ORS;  Service: Podiatry;  Laterality: Right;  . BONE BIOPSY Right 11/10/2018   Procedure: BONE BIOPSY- Right 4th toe;  Surgeon: Caroline More, DPM;  Location: [facility] ORS;  Service: Podiatry;  Laterality: Right;  . BUNIONECTOMY Bilateral   . COLONOSCOPY WITH PROPOFOL N/A 09/07/2018   Procedure: COLONOSCOPY WITH PROPOFOL;  Surgeon: Lin Landsman, MD;  Location: [facility] ENDOSCOPY;  Service: Gastroenterology;  Laterality: N/A;    FHx:  Family History  Problem Relation Age of Onset  . Heart disease Father   . Heart attack Father   . Heart disease Maternal Grandfather     Social History:  reports that she has never smoked. She has never used smokeless tobacco. She reports current alcohol use. She reports that she does not use drugs.  Allergies: No Known Allergies  Review of Systems: General ROS: negative Psychological ROS: negative Respiratory ROS: no cough, shortness of breath, or wheezing Cardiovascular ROS: no chest pain or dyspnea on exertion Musculoskeletal ROS: positive for - joint swelling Neurological ROS: positive for - numbness/tingling Dermatological ROS: positive for Incision right  lateral foot  Medications Prior to Admission  Medication Sig Dispense Refill  . amLODipine (NORVASC) 5 MG tablet TAKE 1 TABLET BY MOUTH EVERY DAY (Patient taking differently: Take 5 mg by mouth daily. ) 90 tablet 0  . atorvastatin (LIPITOR) 20 MG tablet Take 1 tablet (20 mg total) by mouth daily. 90 tablet 3  . glipiZIDE (GLUCOTROL) 10 MG tablet Take 1 tablet (10 mg total) by mouth daily before breakfast. 60 tablet 2  . hydrochlorothiazide (HYDRODIURIL) 25 MG tablet Take 1 tablet (25 mg total) by mouth daily. 90 tablet 3  . Multiple Vitamins-Minerals (WOMENS MULTIVITAMIN PO) Take 1 tablet by mouth daily.     . Vitamin D, Ergocalciferol, (DRISDOL) 1.25 MG (50000 UT) CAPS capsule Take 1 capsule (50,000 Units total) by mouth every 7 (seven) days. 12 capsule 0  . [DISCONTINUED] losartan (COZAAR) 50 MG tablet TAKE 2 TABLETS BY MOUTH EVERY DAY (Patient taking differently: Take 100 mg by mouth daily. ) 180 tablet 0    Physical Exam: General: Alert and oriented.  No apparent distress. Vascular: DP/PT pulses palpable bilateral, CFT intact to digits bilateral and flaps from incision site right fifth ray area. Derm: Incision site appears to be well coapted to the right lateral foot from the partial fifth ray amputation, appears to have mild maceration likely due to vancomycin powder placement, mild periwound erythema and edema consistent with postop course. MSK: No pain on palpation of the right forefoot.  Right partial fifth ray amputation. Neuro: Light touch sensation decreased to bilateral lower extremities.  Results for orders placed or  performed during the hospital encounter of 11/08/18 (from the past 48 hour(s))  Glucose, capillary     Status: Abnormal   Collection Time: 11/10/18 11:16 AM  Result Value Ref Range   Glucose-Capillary 151 (H) 70 - 99 mg/dL  Aerobic Culture (superficial specimen)     Status: Abnormal (Preliminary result)   Collection Time: 11/10/18 11:53 AM   Specimen: Foot; Wound   Result Value Ref Range   Specimen Description      FOOT Performed at [facility], [address] [place], [place] [address]    Special Requests      Normal Performed at [facility], [place], [place] [address]    Gram Stain      RARE WBC PRESENT, PREDOMINANTLY PMN ABUNDANT GRAM POSITIVE COCCI MODERATE GRAM NEGATIVE RODS FEW GRAM POSITIVE RODS Performed at [facility], [place] [address], [place], [place] [address]    Culture MULTIPLE ORGANISMS PRESENT, NONE PREDOMINANT (A)    Report Status PENDING   Aerobic/Anaerobic Culture (surgical/deep wound)     Status: None (Preliminary result)   Collection Time: 11/10/18 11:53 AM   Specimen: Bone; Tissue  Result Value Ref Range   Specimen Description      BONE Performed at [facility], [address] [place], [place] [address]    Special Requests      NONE Performed at [facility], [place], [place] [address]    Gram Stain NO WBC SEEN NO ORGANISMS SEEN     Culture      NO GROWTH < 24 HOURS Performed at [facility], [place] [address], [place], [place] [address]    Report Status PENDING   Glucose, capillary     Status: Abnormal   Collection Time: 11/10/18 12:43 PM  Result Value Ref Range   Glucose-Capillary 129 (H) 70 - 99 mg/dL  Glucose, capillary     Status: Abnormal   Collection Time: 11/10/18  1:44 PM  Result Value Ref Range   Glucose-Capillary 135 (H) 70 - 99 mg/dL  Glucose, capillary     Status: Abnormal   Collection Time: 11/10/18  4:31 PM  Result Value Ref Range   Glucose-Capillary 200 (H) 70 - 99 mg/dL  Glucose, capillary     Status: Abnormal   Collection Time: 11/10/18  9:53 PM  Result Value Ref Range   Glucose-Capillary 185 (H) 70 - 99 mg/dL  CBC     Status: Abnormal   Collection Time: 11/11/18  3:56 AM  Result Value Ref Range   WBC 13.0 (H) 4.0 - 10.5 K/uL   RBC 3.35 (L) 3.87 - 5.11 MIL/uL   Hemoglobin 9.9 (L) 12.0 - 15.0  g/dL   HCT 30.2 (L) 36.0 - 46.0 %   MCV 90.1 80.0 - 100.0 fL   MCH 29.6 26.0 - 34.0 pg   MCHC 32.8 30.0 - 36.0 g/dL   RDW 12.5 11.5 - 15.5 %   Platelets 186 150 - 400 K/uL   nRBC 0.0 0.0 - 0.2 %    Comment: Performed at [facility], [address], [place], [place] [address]  Basic metabolic panel     Status: Abnormal   Collection Time: 11/11/18  3:56 AM  Result Value Ref Range   Sodium 137 135 - 145 mmol/L   Potassium 3.8 3.5 - 5.1 mmol/L   Chloride 104 98 - 111 mmol/L   CO2 26 22 - 32 mmol/L   Glucose, Bld  203 (H) 70 - 99 mg/dL   BUN 17 6 - 20 mg/dL   Creatinine, Ser 0.83 0.44 - 1.00 mg/dL   Calcium 8.6 (L) 8.9 - 10.3 mg/dL   GFR calc non Af Amer >60 >60 mL/min   GFR calc Af Amer >60 >60 mL/min   Anion gap 7 5 - 15    Comment: Performed at [facility], [address], [place], [place] [address]  Magnesium     Status: Abnormal   Collection Time: 11/11/18  3:56 AM  Result Value Ref Range   Magnesium 1.6 (L) 1.7 - 2.4 mg/dL    Comment: Performed at [facility], [place], [place] [address]  Glucose, capillary     Status: Abnormal   Collection Time: 11/11/18  9:00 AM  Result Value Ref Range   Glucose-Capillary 153 (H) 70 - 99 mg/dL   Comment 1 Notify RN   Glucose, capillary     Status: Abnormal   Collection Time: 11/11/18 11:57 AM  Result Value Ref Range   Glucose-Capillary 171 (H) 70 - 99 mg/dL   Comment 1 Notify RN   Glucose, capillary     Status: Abnormal   Collection Time: 11/11/18  4:36 PM  Result Value Ref Range   Glucose-Capillary 139 (H) 70 - 99 mg/dL   Comment 1 Notify RN   Glucose, capillary     Status: Abnormal   Collection Time: 11/11/18  9:09 PM  Result Value Ref Range   Glucose-Capillary 148 (H) 70 - 99 mg/dL  Glucose, capillary     Status: Abnormal   Collection Time: 11/12/18  8:16 AM  Result Value Ref Range   Glucose-Capillary 159 (H) 70 - 99 mg/dL   Dg Foot 2 Views Right  Result Date:  11/10/2018 CLINICAL DATA:  Status post right fifth toe amputation. EXAM: RIGHT FOOT - 2 VIEW COMPARISON:  Radiographs of October 16, 2018. FINDINGS: Status post surgical amputation of the distal fifth metatarsal and phalanges. No radiopaque foreign body is noted. Mild posterior calcaneal spurring is noted. Joint spaces are unremarkable. IMPRESSION: Status post surgical amputation of the distal fifth metatarsal and phalanges. Electronically Signed   By: Marijo Conception M.D.   On: 11/10/2018 14:24    Blood pressure 126/75, pulse 83, temperature 98.5 F (36.9 C), temperature source Oral, resp. rate 17, height 5\' 6"  (1.676 m), weight 96.8 kg, SpO2 94 %.   Assessment 1. 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 Cellulitis right foot -resolved 3. Diabetes type 2 with polyneuropathy  Plan -Dressing intact with no strikethrough today. -Dressings removed to the right foot.  Cellulitis appears to be much improved and incision site is well coapted with sutures intact.  Mild maceration likely related to vancomycin powder placement during procedure. -Bone culture with no growth to date to the fourth metatarsal phalangeal joint right foot.  Wound culture grew Morganella, Klebsiella, strep group G.  Recommend 7 to 10-day course of oral antibiotics upon discharge based on wound culture results.  Appreciate medicine recs. -Continue partial weightbearing to the right foot in a surgical Haydel at all times with heel contact only.  Discussed with patient need to try to stay off of the right foot is much as possible and use for only minimal daily activities.  Discussed that being on the foot more than instructed can lead to wound healing complications.  Patient understands. -Leave dressing clean, dry, and intact until postoperative visit  in clinic.  Discussed with patient need to come in the clinic next Friday for dressing change.  From podiatry standpoint patient can be  discharged when medically stable.  Patient has follow-up instructions placed in chart to make follow-up appointment within 1 week of discharge date.  Podiatry team signing off at this time.  Caroline More 11/12/2018, 10:50 AM

## 2018-11-12 NOTE — Progress Notes (Signed)
Physical Therapy Treatment Patient Details Name: Rebecca Park MRN: [id number] DOB: 06/14/1960 Today's Date: 11/12/2018    History of Present Illness Rebecca Park is a 58yoF who comes to [facility] on 8/12 with nausea and weakness for 3-4weeks, found to have osteomyelitis in right foot from gardening injur in July. 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    PT Comments    Chart reviewed.  Per Dr, Baker 8/15 note -   Continue partial weightbearing with right heel contact in a surgical Bowler at all times.  Try to be on foot for transfers only and stay off the foot as much as possible. Pt with post-op wedge [place] on upon arrival.  Discussed with pt.  Will direct message to clarify with MD as she stated he will be in around 11:00 today for dressing changes regarding equipment needs for discharge.  Bed mobility without assist.  She is able to stand to walker with education for hand placements and transfer to wheelchair.  Education provided regarding wheelchair use and safety.  She is able to propel herself to/from rehab gym for stair training.  She is able to go up/down steps with right rail and left cane with min guard/assist for safety.  Verbal cues for sequencing with good ability to maintain PWB on steps with wedge Whan.  She was able to ambulate 10' to chair with RW with PWB R heel only with good balance and safety.  Pt steady and confident with gait and would have no trouble walking further with WB limitations but was deferred out of precaution until MD clarification/visit.   Patient suffers from 5th ray amputation which impairs his/her ability to perform daily activities like toileting, feeding, dressing, grooming, bathing in the home. A cane, walker, crutch will not resolve the patient's issue with performing activities of daily living. A lightweight wheelchair is required/recommended and will allow patient to safely perform daily activities.   Patient can safely  propel the wheelchair in the home or has a caregiver who can provide assistance.   Pt stated she was comfortable with discharge today and told her if other questions arise after discussion with MD to have nursing contact my and I will come back to address them.      Follow Up Recommendations  Home health PT     Equipment Recommendations  Rolling walker with 5" wheels;Wheelchair (measurements PT)    Recommendations for Other Services       Precautions / Restrictions Precautions Precautions: Fall Restrictions Weight Bearing Restrictions: Yes RLE Weight Bearing: Partial weight bearing Other Position/Activity Restrictions: Continue partial weightbearing with right heel contact in a surgical Lasure at all times.  Try to be on foot for transfers only and stay off the foot as much as possible. - per surgery note dated 8/15    Mobility  Bed Mobility Overal bed mobility: Modified Independent                Transfers Overall transfer level: Modified independent               General transfer comment: verbal cues for hand placements  Ambulation/Gait Ambulation/Gait assistance: Supervision;Min guard Gait Distance (Feet): 10 Feet Assistive device: Rolling walker (2 wheeled) Gait Pattern/deviations: Step-to pattern;Decreased stance time - right Gait velocity: decreased   General Gait Details: PWB R withpost op wedge [place] - does well with PWB status   Stairs Stairs: Yes Stairs assistance: Min guard;Min assist Stair Management: One rail   Right;With cane;Step to pattern Number of Stairs: 4 General stair comments: does well maintaining PWB on R with rail and [facility]   Wheelchair Mobility Wheelchair Mobility Wheelchair mobility: Yes Wheelchair propulsion: Both upper extremities;Left upper extremity Wheelchair parts: Supervision/cueing Distance: 160'  Modified Rankin (Stroke Patients Only)       Balance Overall balance assessment: Modified Independent;Mild deficits  observed, not formally tested                                          Cognition Arousal/Alertness: Awake/alert Behavior During Therapy: WFL for tasks assessed/performed Overall Cognitive Status: Within Functional Limits for tasks assessed                                        Exercises      General Comments        Pertinent Vitals/Pain Pain Assessment: No/denies pain    Home Living                      Prior Function            PT Goals (current goals can now be found in the care plan section) Progress towards PT goals: Progressing toward goals    Frequency    7X/week      PT Plan Current plan remains appropriate    Co-evaluation              AM-PAC PT "6 Clicks" Mobility   Outcome Measure  Help needed turning from your back to your side while in a flat bed without using bedrails?: None Help needed moving from lying on your back to sitting on the side of a flat bed without using bedrails?: None Help needed moving to and from a bed to a chair (including a wheelchair)?: None Help needed standing up from a chair using your arms (e.g., wheelchair or bedside chair)?: None Help needed to walk in hospital room?: A Little Help needed climbing 3-5 steps with a railing? : A Little 6 Click Score: 22    End of Session Equipment Utilized During Treatment: Gait belt Activity Tolerance: Patient tolerated treatment well Patient left: in chair;with family/visitor present;with call bell/phone within reach         Time: 0920-0945 PT Time Calculation (min) (ACUTE ONLY): 25 min  Charges:  $Gait Training: 8-22 mins $Therapeutic Activity: 8-22 mins                     Chesley Noon, PTA 11/12/18, 10:01 AM

## 2018-11-12 NOTE — Discharge Summary (Signed)
Rebecca Park NAME: Rebecca Park    MR#:  [id number]  DATE OF BIRTH:  1960-12-11  DATE OF ADMISSION:  11/08/2018 ADMITTING PHYSICIAN: Rebecca Mormon, MD  DATE OF DISCHARGE: 11/12/2018 12:45 PM  PRIMARY CARE PHYSICIAN: Rebecca Fenton, NP    ADMISSION DIAGNOSIS:  Osteomyelitis of right foot, unspecified type ([place]) [M86.9]  DISCHARGE DIAGNOSIS:  Active Problems:   Osteomyelitis ([place])   SECONDARY DIAGNOSIS:   Past Medical History:  Diagnosis Date  . Diabetes mellitus without complication ([place])   . Hypertension     HOSPITAL COURSE:   1.  Right foot osteomyelitis.  Patient had a partial fifth ray amputation and a bone biopsy of the fourth metatarsal head.  Patient was on Rocephin and Flagyl IV while here in the hospital.  Initial culture grew out [place]ella strep G and Klebsiella.  Intraoperative bone biopsy so far negative for infection.  This needs to be followed up as outpatient.  Intraoperative culture too early to read.  Patient prescribed Levaquin 750 mg daily and penicillin VK 500 mg 4 times a day upon discharge home.  Follow-up with Dr. Luana Park podiatry as outpatient.  He will do the dressing changes and follow-up appointment. 2.  Nausea.  This was secondary to anesthesia.  Patient feeling better. 3.  Type 2 diabetes mellitus with complication of foot infection and osteomyelitis.  Patient was on sliding scale insulin here and glipizide at home.  Hemoglobin A1c 6.7 under good control.  Can restart glipizide as outpatient. 4.  Hypertension on Norvasc and losartan 5.  Hyperlipidemia unspecified on Lipitor 6.  Hypomagnesemia.  Patient was given IV magnesium yesterday 7.  Anemia.  Continue to follow-up as outpatient  DISCHARGE CONDITIONS:   Satisfactory  CONSULTS OBTAINED:  Treatment Team:  Rebecca Park, DPM  DRUG ALLERGIES:  No Known Allergies  DISCHARGE MEDICATIONS:   Allergies as of 11/12/2018   No Known Allergies      Medication List    STOP taking these medications   hydrochlorothiazide 25 MG tablet Commonly known as: HYDRODIURIL     TAKE these medications   amLODipine 5 MG tablet Commonly known as: NORVASC TAKE 1 TABLET BY MOUTH EVERY DAY   atorvastatin 20 MG tablet Commonly known as: LIPITOR Take 1 tablet (20 mg total) by mouth daily.   Ensure Max Protein Liqd Take 330 mLs (11 oz total) by mouth 2 (two) times daily between meals.   glipiZIDE 10 MG tablet Commonly known as: Glucotrol Take 1 tablet (10 mg total) by mouth daily before breakfast.   levofloxacin 750 MG tablet Commonly known as: LEVAQUIN Take 1 tablet (750 mg total) by mouth daily. Start taking on: November 13, 2018   losartan 50 MG tablet Commonly known as: COZAAR Take 2 tablets (100 mg total) by mouth daily.   penicillin v potassium 500 MG tablet Commonly known as: VEETID Take 1 tablet (500 mg total) by mouth 4 (four) times daily for 8 days.   Vitamin D (Ergocalciferol) 1.25 MG (50000 UT) Caps capsule Commonly known as: DRISDOL Take 1 capsule (50,000 Units total) by mouth every 7 (seven) days.   WOMENS MULTIVITAMIN PO Take 1 tablet by mouth daily.        DISCHARGE INSTRUCTIONS:   Follow-up PMD 5 days Follow-up podiatry 1 week  If you experience worsening of your admission symptoms, develop shortness of breath, life threatening emergency, suicidal or homicidal thoughts you must seek medical attention immediately by calling 911  or calling your MD immediately  if symptoms less severe.  You Must read complete instructions/literature along with all the possible adverse reactions/side effects for all the Medicines you take and that have been prescribed to you. Take any new Medicines after you have completely understood and accept all the possible adverse reactions/side effects.   Please note  You were cared for by a hospitalist during your hospital stay. If you have any questions about your discharge medications  or the care you received while you were in the hospital after you are discharged, you can call the unit and asked to speak with the hospitalist on call if the hospitalist that took care of you is not available. Once you are discharged, your primary care physician will handle any further medical issues. Please note that NO REFILLS for any discharge medications will be authorized once you are discharged, as it is imperative that you return to your primary care physician (or establish a relationship with a primary care physician if you do not have one) for your aftercare needs so that they can reassess your need for medications and monitor your lab values.    Today   CHIEF COMPLAINT:   Chief Complaint  Patient presents with  . Nausea  . Weakness    HISTORY OF PRESENT ILLNESS:  Rebecca Park  is a 58 y.o. female came in with nausea weakness and found to have osteomyelitis   VITAL SIGNS:  Blood pressure 126/75, pulse 83, temperature 98.5 F (36.9 C), temperature source Oral, resp. rate 17, height 5\' 6"  (1.676 m), weight 96.8 kg, SpO2 94 %.    PHYSICAL EXAMINATION:  GENERAL:  57 y.o.-year-old patient lying in the bed with no acute distress.  EYES: Pupils equal, round, reactive to light and accommodation. No scleral icterus. Extraocular muscles intact.  HEENT: Head atraumatic, normocephalic. Oropharynx and nasopharynx clear.  NECK:  Supple, no jugular venous distention. No thyroid enlargement, no tenderness.  LUNGS: Normal breath sounds bilaterally, no wheezing, rales,rhonchi or crepitation. No use of accessory muscles of respiration.  CARDIOVASCULAR: S1, S2 normal. No murmurs, rubs, or gallops.  ABDOMEN: Soft, non-tender, non-distended. Bowel sounds present. No organomegaly or mass.  EXTREMITIES: No pedal edema, cyanosis, or clubbing.  NEUROLOGIC: Cranial nerves II through XII are intact. Muscle strength 5/5 in all extremities. Sensation intact. Gait not checked.  PSYCHIATRIC: The patient  is alert and oriented x 3.  SKIN: Right foot covered  DATA REVIEW:   CBC Recent Labs  Lab 11/11/18 0356  WBC 13.0*  HGB 9.9*  HCT 30.2*  PLT 186    Chemistries  Recent Labs  Lab 11/11/18 0356  NA 137  K 3.8  CL 104  CO2 26  GLUCOSE 203*  BUN 17  CREATININE 0.83  CALCIUM 8.6*  MG 1.6*     Microbiology Results  Results for orders placed or performed during the hospital encounter of 11/08/18  Culture, blood (routine x 2)     Status: None (Preliminary result)   Collection Time: 11/08/18  6:39 PM   Specimen: BLOOD  Result Value Ref Range Status   Specimen Description BLOOD BLOOD RIGHT FOREARM  Final   Special Requests   Final    BOTTLES DRAWN AEROBIC AND ANAEROBIC Blood Culture results may not be optimal due to an inadequate volume of blood received in culture bottles   Culture   Final    NO GROWTH 4 DAYS Performed at [facility], [address], [place], [place] [address]  Report Status PENDING  Incomplete  SARS Coronavirus 2 [facility] order, Performed in [facility] hospital lab) Nasopharyngeal Nasopharyngeal Swab     Status: None   Collection Time: 11/08/18  6:39 PM   Specimen: Nasopharyngeal Swab  Result Value Ref Range Status   SARS Coronavirus 2 NEGATIVE NEGATIVE Final    Comment: (NOTE) If result is NEGATIVE SARS-CoV-2 target nucleic acids are NOT DETECTED. The SARS-CoV-2 RNA is generally detectable in upper and lower  respiratory specimens during the acute phase of infection. The lowest  concentration of SARS-CoV-2 viral copies this assay can detect is 250  copies / mL. A negative result does not preclude SARS-CoV-2 infection  and should not be used as the sole basis for treatment or other  patient management decisions.  A negative result may occur with  improper specimen collection / handling, submission of specimen other  than nasopharyngeal swab, presence of viral mutation(s) within the  areas targeted by this assay, and inadequate  number of viral copies  (<250 copies / mL). A negative result must be combined with clinical  observations, patient history, and epidemiological information. If result is POSITIVE SARS-CoV-2 target nucleic acids are DETECTED. The SARS-CoV-2 RNA is generally detectable in upper and lower  respiratory specimens dur ing the acute phase of infection.  Positive  results are indicative of active infection with SARS-CoV-2.  Clinical  correlation with patient history and other diagnostic information is  necessary to determine patient infection status.  Positive results do  not rule out bacterial infection or co-infection with other viruses. If result is PRESUMPTIVE POSTIVE SARS-CoV-2 nucleic acids MAY BE PRESENT.   A presumptive positive result was obtained on the submitted specimen  and confirmed on repeat testing.  While 2019 novel coronavirus  (SARS-CoV-2) nucleic acids may be present in the submitted sample  additional confirmatory testing may be necessary for epidemiological  and / or clinical management purposes  to differentiate between  SARS-CoV-2 and other Sarbecovirus currently known to infect humans.  If clinically indicated additional testing with an alternate test  methodology [phone]) is advised. The SARS-CoV-2 RNA is generally  detectable in upper and lower respiratory sp ecimens during the acute  phase of infection. The expected result is Negative. Fact Sheet for Patients:  StrictlyIdeas.no Fact Sheet for Healthcare Providers: BankingDealers.co.za This test is not yet approved or cleared by the [place] FDA and has been authorized for detection and/or diagnosis of SARS-CoV-2 by FDA under an Emergency Use Authorization (EUA).  This EUA will remain in effect (meaning this test can be used) for the duration of the COVID-19 declaration under Section 564(b)(1) of the Act, 21 U.S.C. section 360bbb-3(b)(1), unless the  authorization is terminated or revoked sooner. Performed at [facility], [address] [place], [place] [address]   Culture, blood (routine x 2)     Status: None (Preliminary result)   Collection Time: 11/08/18  6:40 PM   Specimen: BLOOD  Result Value Ref Range Status   Specimen Description BLOOD LEFT ANTECUBITAL  Final   Special Requests   Final    BOTTLES DRAWN AEROBIC AND ANAEROBIC Blood Culture results may not be optimal due to an excessive volume of blood received in culture bottles   Culture   Final    NO GROWTH 4 DAYS Performed at [facility], [address], [place],  [address]    Report Status PENDING  Incomplete  Surgical pcr screen     Status: None   Collection Time: 11/10/18  7:24 AM   Specimen: Nasal Mucosa; Nasal Swab  Result Value Ref Range Status   MRSA, PCR NEGATIVE NEGATIVE Final   Staphylococcus aureus NEGATIVE NEGATIVE Final    Comment: (NOTE) The Xpert SA Assay (FDA approved for NASAL specimens in patients 3 years of age and older), is one component of a comprehensive surveillance program. It is not intended to diagnose infection nor to guide or monitor treatment. Performed at [facility], [address] [place], [place] [address]   Aerobic Culture (superficial specimen)     Status: Abnormal (Preliminary result)   Collection Time: 11/10/18 11:53 AM   Specimen: Foot; Wound  Result Value Ref Range Status   Specimen Description   Final    FOOT Performed at [facility], [address] [place], [place] [address]    Special Requests   Final    Normal Performed at [facility], [place], [place] [address]    Gram Stain   Final    RARE WBC PRESENT, PREDOMINANTLY PMN ABUNDANT GRAM POSITIVE COCCI MODERATE GRAM NEGATIVE RODS FEW GRAM POSITIVE RODS Performed at [facility], [place] [address] [place], [place] [address]    Culture MULTIPLE ORGANISMS PRESENT, NONE PREDOMINANT  (A)  Final   Report Status PENDING  Incomplete  Aerobic/Anaerobic Culture (surgical/deep wound)     Status: None (Preliminary result)   Collection Time: 11/10/18 11:53 AM   Specimen: Bone; Tissue  Result Value Ref Range Status   Specimen Description   Final    BONE Performed at [facility], [address] [place], [place] [address]    Special Requests   Final    NONE Performed at [facility], [address] [place], [place] [address]    Gram Stain NO WBC SEEN NO ORGANISMS SEEN   Final   Culture   Final    CULTURE REINCUBATED FOR BETTER GROWTH Performed at [facility], [place] [address] [place],  [address]    Report Status PENDING  Incomplete    RADIOLOGY:  Dg Foot 2 Views Right  Result Date: 11/10/2018 CLINICAL DATA:  Status post right fifth toe amputation. EXAM: RIGHT FOOT - 2 VIEW COMPARISON:  Radiographs of October 16, 2018. FINDINGS: Status post surgical amputation of the distal fifth metatarsal and phalanges. No radiopaque foreign body is noted. Mild posterior calcaneal spurring is noted. Joint spaces are unremarkable. IMPRESSION: Status post surgical amputation of the distal fifth metatarsal and phalanges. Electronically Signed   By: Marijo Conception M.D.   On: 11/10/2018 14:24     Management plans discussed with the patient, and she is in agreement.  CODE STATUS:     Code Status Orders  (From admission, onward)         Start     Ordered   11/09/18 0007  Full code  Continuous     11/09/18 0006        Code Status History    This patient has a current code status but no historical code status.   Advance Care Planning Activity      TOTAL TIME TAKING CARE OF THIS PATIENT: 35 minutes.    Loletha Grayer M.D on 11/12/2018 at 1:35 PM  Between 7am to 6pm - Pager - [phone]  After 6pm go to [email or web address] - Proofreader  [employer] Office  [phone]  CC: Primary care physician; Rebecca Fenton, NP

## 2018-11-13 ENCOUNTER — Ambulatory Visit: Payer: BC Managed Care – PPO | Admitting: Physician Assistant

## 2018-11-13 LAB — CULTURE, BLOOD (ROUTINE X 2)
Culture: NO GROWTH
Culture: NO GROWTH

## 2018-11-13 LAB — SURGICAL PATHOLOGY

## 2018-11-14 ENCOUNTER — Telehealth: Payer: Self-pay | Admitting: *Deleted

## 2018-11-14 ENCOUNTER — Other Ambulatory Visit: Payer: Self-pay | Admitting: Podiatry

## 2018-11-14 LAB — AEROBIC CULTURE W GRAM STAIN (SUPERFICIAL SPECIMEN): Special Requests: NORMAL

## 2018-11-14 MED ORDER — CEPHALEXIN 500 MG PO CAPS: 500.0000 mg | ORAL_CAPSULE | Freq: Four times a day (QID) | ORAL | 0 refills | Status: AC

## 2018-11-14 NOTE — Progress Notes (Signed)
Pharmacy - Antimicrobial Stewardship  Bone Culture from 8/14 OR = MSSA and S. Anginosis.  Discharged on levofloxacin and PCN VK.  Concern that discharged antibiotics will not properly treat MSSA.  Suggestion to change to Cephalexin d/w Dr Leslye Peer.  Treat as osteomyelitis  [facility], tried to speak to someone in pharmacy but eventually it transferred me to line to leave prescription via voicemail.  Prescription left for Keflex 500mg  QID #[id number] no refills.    Doreene Eland, PharmD, BCPS.   Work Cell: [phone] 11/14/2018 11:46 AM

## 2018-11-14 NOTE — Progress Notes (Signed)
Patient ID: Rebecca Park, female   DOB: Dec 22, 1960, 58 y.o.   MRN: [id number]  Left message for patient about changing abx and pharmacist to call that in.  Also toughed base with Dr Luana Shu to follow up with results.  DR Leslye Peer

## 2018-11-14 NOTE — Telephone Encounter (Signed)
Called patient to schedule a hospital follow-up based on discharge summary. Appointment scheduled with Webb Silversmith NP 11/17/18 at 4:00. Negative covid screening.

## 2018-11-15 NOTE — Telephone Encounter (Signed)
noted 

## 2018-11-16 LAB — AEROBIC/ANAEROBIC CULTURE W GRAM STAIN (SURGICAL/DEEP WOUND): Gram Stain: NONE SEEN

## 2018-11-17 ENCOUNTER — Encounter: Payer: Self-pay | Admitting: Internal Medicine

## 2018-11-17 ENCOUNTER — Other Ambulatory Visit: Payer: Self-pay

## 2018-11-17 ENCOUNTER — Ambulatory Visit: Payer: BC Managed Care – PPO | Admitting: Internal Medicine

## 2018-11-17 VITALS — BP 124/84 | HR 100 | Temp 97.7°F

## 2018-11-17 DIAGNOSIS — M86171 Other acute osteomyelitis, right ankle and foot: Secondary | ICD-10-CM | POA: Diagnosis not present

## 2018-11-17 DIAGNOSIS — Z89431 Acquired absence of right foot: Secondary | ICD-10-CM

## 2018-11-17 NOTE — Progress Notes (Signed)
Subjective:    Patient ID: Rebecca Park, female    DOB: November 24, 1960, 58 y.o.   MRN: [id number]  HPI    Review of Systems      Past Medical History:  Diagnosis Date  . Diabetes mellitus without complication ([place])   . Hypertension     Current Outpatient Medications  Medication Sig Dispense Refill  . amLODipine (NORVASC) 5 MG tablet TAKE 1 TABLET BY MOUTH EVERY DAY (Patient taking differently: Take 5 mg by mouth daily. ) 90 tablet 0  . atorvastatin (LIPITOR) 20 MG tablet Take 1 tablet (20 mg total) by mouth daily. 90 tablet 3  . cephALEXin (KEFLEX) 500 MG capsule Take 1 capsule (500 mg total) by mouth 4 (four) times daily for 7 days. 28 capsule 0  . Ensure Max Protein (ENSURE MAX PROTEIN) LIQD Take 330 mLs (11 oz total) by mouth 2 (two) times daily between meals. 330 mL 0  . glipiZIDE (GLUCOTROL) 10 MG tablet Take 1 tablet (10 mg total) by mouth daily before breakfast. 60 tablet 2  . levofloxacin (LEVAQUIN) 750 MG tablet Take 1 tablet (750 mg total) by mouth daily. 7 tablet 0  . losartan (COZAAR) 50 MG tablet Take 2 tablets (100 mg total) by mouth daily.    . Multiple Vitamins-Minerals (WOMENS MULTIVITAMIN PO) Take 1 tablet by mouth daily.     . Vitamin D, Ergocalciferol, (DRISDOL) 1.25 MG (50000 UT) CAPS capsule Take 1 capsule (50,000 Units total) by mouth every 7 (seven) days. 12 capsule 0   No current facility-administered medications for this visit.     No Known Allergies  Family History  Problem Relation Age of Onset  . Heart disease Father   . Heart attack Father   . Heart disease Maternal Grandfather     Social History   Socioeconomic History  . Marital status: Married    Spouse name: Not on file  . Number of children: Not on file  . Years of education: Not on file  . Highest education level: Not on file  Occupational History  . Not on file  Social Needs  . Financial resource strain: Not on file  . Food insecurity    Worry: Not on file    Inability: Not  on file  . Transportation needs    Medical: Not on file    Non-medical: Not on file  Tobacco Use  . Smoking status: Never Smoker  . Smokeless tobacco: Never Used  Substance and Sexual Activity  . Alcohol use: Yes    Comment: recently quit 01/2018, 3-4 beers QD  . Drug use: Never  . Sexual activity: Not on file  Lifestyle  . Physical activity    Days per week: Not on file    Minutes per session: Not on file  . Stress: Not on file  Relationships  . Social Herbalist on phone: Not on file    Gets together: Not on file    Attends religious service: Not on file    Active member of club or organization: Not on file    Attends meetings of clubs or organizations: Not on file    Relationship status: Not on file  . Intimate partner violence    Fear of current or ex partner: Not on file    Emotionally abused: Not on file    Physically abused: Not on file    Forced sexual activity: Not on file  Other Topics Concern  . Not on file  Social History Narrative  . Not on file     Constitutional: Denies fever, malaise, fatigue, headache or abrupt weight changes.  HEENT: Denies eye pain, eye redness, ear pain, ringing in the ears, wax buildup, runny nose, nasal congestion, bloody nose, or sore throat. Respiratory: Denies difficulty breathing, shortness of breath, cough or sputum production.   Cardiovascular: Denies chest pain, chest tightness, palpitations or swelling in the hands or feet.  Gastrointestinal: Denies abdominal pain, bloating, constipation, diarrhea or blood in the stool.  GU: Denies urgency, frequency, pain with urination, burning sensation, blood in urine, odor or discharge. Musculoskeletal: Denies decrease in range of motion, difficulty with gait, muscle pain or joint pain and swelling.  Skin: Denies redness, rashes, lesions or ulcercations.  Neurological: Denies dizziness, difficulty with memory, difficulty with speech or problems with balance and coordination.   Psych: Denies anxiety, depression, SI/HI.  No other specific complaints in a complete review of systems (except as listed in HPI above).  Objective:   Physical Exam  BP 124/84   Pulse 100   Temp 97.7 F (36.5 C) (Temporal)   SpO2 100%  Wt Readings from Last 3 Encounters:  11/09/18 213 lb 4.8 oz (96.8 kg)  10/16/18 223 lb (101.2 kg)  09/15/18 221 lb 12 oz (100.6 kg)    General: Appears their stated age, well developed, well nourished in NAD. Skin: Warm, dry and intact. No rashes, lesions or ulcerations noted. HEENT: Head: normal shape and size; Eyes: sclera white, no icterus, conjunctiva pink, PERRLA and EOMs intact; Ears: Tm's gray and intact, normal light reflex; Nose: mucosa pink and moist, septum midline; Throat/Mouth: Teeth present, mucosa pink and moist, no exudate, lesions or ulcerations noted.  Neck:  Neck supple, trachea midline. No masses, lumps or thyromegaly present.  Cardiovascular: Normal rate and rhythm. S1,S2 noted.  No murmur, rubs or gallops noted. No JVD or BLE edema. No carotid bruits noted. Pulmonary/Chest: Normal effort and positive vesicular breath sounds. No respiratory distress. No wheezes, rales or ronchi noted.  Abdomen: Soft and nontender. Normal bowel sounds. No distention or masses noted. Liver, spleen and kidneys non palpable. Musculoskeletal: Normal range of motion. No signs of joint swelling. No difficulty with gait.  Neurological: Alert and oriented. Cranial nerves II-XII grossly intact. Coordination normal.  Psychiatric: Mood and affect normal. Behavior is normal. Judgment and thought content normal.   EKG:  BMET    Component Value Date/Time   NA 137 11/11/2018 0356   K 3.8 11/11/2018 0356   CL 104 11/11/2018 0356   CO2 26 11/11/2018 0356   GLUCOSE 203 (H) 11/11/2018 0356   BUN 17 11/11/2018 0356   CREATININE 0.83 11/11/2018 0356   CREATININE 0.93 09/15/2018 1545   CALCIUM 8.6 (L) 11/11/2018 0356   GFRNONAA >60 11/11/2018 0356   GFRAA >60  11/11/2018 0356    Lipid Panel     Component Value Date/Time   CHOL 201 (H) 09/15/2018 1545   TRIG 195 (H) 09/15/2018 1545   HDL 60 09/15/2018 1545   CHOLHDL 3.4 09/15/2018 1545   VLDL 19.6 06/06/2018 0912   LDLCALC 110 (H) 09/15/2018 1545    CBC    Component Value Date/Time   WBC 13.0 (H) 11/11/2018 0356   RBC 3.35 (L) 11/11/2018 0356   HGB 9.9 (L) 11/11/2018 0356   HCT 30.2 (L) 11/11/2018 0356   PLT 186 11/11/2018 0356   MCV 90.1 11/11/2018 0356   MCH 29.6 11/11/2018 0356   MCHC 32.8 11/11/2018 0356   RDW  12.5 11/11/2018 0356    Hgb A1C Lab Results  Component Value Date   HGBA1C 6.7 (H) 11/09/2018            Assessment & Plan:

## 2018-11-18 ENCOUNTER — Other Ambulatory Visit: Payer: Self-pay | Admitting: Internal Medicine

## 2018-11-18 DIAGNOSIS — E1159 Type 2 diabetes mellitus with other circulatory complications: Secondary | ICD-10-CM

## 2018-11-19 ENCOUNTER — Encounter: Payer: Self-pay | Admitting: Internal Medicine

## 2018-11-19 ENCOUNTER — Other Ambulatory Visit: Payer: Self-pay | Admitting: Internal Medicine

## 2018-11-19 NOTE — Patient Instructions (Signed)
Diabetes Mellitus and Skin Care Diabetes (diabetes mellitus) can lead to health problems over time, including skin problems. People with diabetes have a higher risk for many types of skin complications. This is because having poorly controlled blood sugar (glucose) levels can:  Damage nerves and blood vessels. This can result in decreased feeling in your legs and feet, which means you may not notice minor skin injuries that could lead to serious problems.  Reduce blood flow (circulation), which makes wounds heal more slowly and increases your risk of infection.  Cause areas of skin to become thick or discolored. What are some common skin conditions that affect people with diabetes? Diabetes often causes dry skin. It can also cause the skin on the feet to get thinner, break more easily, and heal more slowly. There are certain skin conditions that commonly affect people who have diabetes, such as:  Bacterial skin infections, such as styes, boils, infected hair follicles, and infections of the skin around the nails.  Fungal skin infections. These are most common in areas where skin rubs together, such as in the armpits or under the breasts.  Open sores, especially on the feet.  Tissue death (gangrene). This can happen on your feet if a serious infection does not heal properly. Gangrene can cause the need for a foot or leg to be surgically removed (amputated). Diabetes can also cause the skin to change. You may develop:  Dark, velvety markings on the skin that usually appear on the face, neck, armpits, inner thighs, and groin (acanthosis nigricans). This typically affects people of African-American and American-Indian descent.  Red, raised, scar-like tissue that may itch, feel painful, or develop into a wound (necrobiosis lipoidica).  Blisters on feet, toes, hands, or fingers.  Thickened, wax-like areas of skin that usually occur on the hands, forehead, or toes (digital sclerosis).  Brown or  red ring-shaped or half-ring-shaped patches of skin on the ears or fingers (disseminated granuloma).  Pea-shaped yellow bumps that may be itchy and surrounded by a red ring (eruptive xanthomatosis). This usually affects the arms, feet, buttocks, and the top of the hands.  Round, discolored patches of tan skin that do not hurt or itch (diabetic dermopathy). These may look like age spots. What do I need to know about itchy skin? It is common for people with diabetes to have itchy skin caused by dryness. Frequent high blood glucose levels can cause itchiness, and poor circulation and certain skin infections can make dry, itchy skin worse. If you have itchy skin that is red or covered in a rash, this could be a sign of an allergic reaction to a medicine. If you have a rash or if your skin is very itchy, contact your health care provider. You may need help to manage your diabetes better, or you may need treatment for an infection. How can I prevent skin breakdown? When you have diabetes and you get a badly infected ulcer or sore that does not heal, your skin can break down, especially if you have poor circulation or are on bed rest. To prevent skin breakdown:  Keep your skin clean and dry. Wash your skin often. Do not use hot water.  Do not use any products that contain nicotine or tobacco, such as cigarettes and e-cigarettes. Smoking affects the body's ability to heal. If you need help quitting, ask your health care provider.  Check your skin every day for cuts, bruises, redness, blisters, or sores, especially on your feet. Tell your health care provider  about any cuts, wounds, or sores you have, especially if they are healing slowly.  If you are on bed rest, try to change positions often. What else do I need to know about taking care of my skin?   To relieve dry skin and itching: ? Limit baths and showers to 5-10 minutes. ? Bathe with lukewarm water instead of hot water. ? Use mild soap and  gentle skin cleansers. Do not use soap that is perfumed or harsh or dries your skin. ? Put on lotion as soon as you finish bathing.  Make sure that your health care provider performs a visual foot exam at every medical visit.  Schedule a foot exam with your health care provider once every year. This exam includes an inspection of the structure and skin of your feet.  If you get a skin injury, such as a cut, blister, or sore, check the area every day for signs of infection. Check for: ? More redness, swelling, or pain. ? More fluid or blood. ? Warmth. ? Pus or a bad smell. Contact a health care provider if:  You develop a cut or sore, especially on your feet.  You develop signs of infection after a skin injury.  Your blood glucose level is higher than 240 mg/dL (13.3 mmol/L) for 2 days in a row.  You have itchy skin that develops redness or a rash.  You have discolored areas of skin.  You have areas where your skin is changing, such as thickening or appearing shiny. Summary  Diabetes (diabetes mellitus) can lead to health problems over time, including skin problems.  People with diabetes have a higher risk for many types of skin complications.  Check your skin every day for cuts, bruises, redness, blisters, or sores, especially on your feet.  Tell your health care provider about any cuts, wounds, or sores you have, especially if they are healing slowly. This information is not intended to replace advice given to you by your health care provider. Make sure you discuss any questions you have with your health care provider. Document Released: 08/26/2015 Document Revised: 10/07/2016 Document Reviewed: 08/26/2015 Elsevier Patient Education  2020 [employer].

## 2018-11-19 NOTE — Progress Notes (Signed)
Subjective:    Patient ID: Rebecca Park, female    DOB: 26-Nov-1960, 58 y.o.   MRN: [id number]  HPI  Pt presents to the clinic today for hospital follow up. She went to the ER 8/12 with c/o fever, chills, nausea and right foot pain. She has sustained a laceration to the bottom of her right foot 1-2 weeks prior but did not seek care until it was too late. She does have diabetes. She was referred to the [facility] where MRI showed osteomyelitis of the right fifth toe and fourth metatarsal head. Culture grew Klebsiella and Morganella. She was treated with Rocephin a nd Flagyl. She underwent partial amputation of the toes. She was discharged on Pen VK and Levaquin. She was discharged home on 8/16. She followed up with podiatry today. They changed her dressing and said everything looked good. She is using a walker for balance but does not need PT. She is not having any pain at this time.  Review of Systems      Past Medical History:  Diagnosis Date  . Diabetes mellitus without complication ([place])   . Hypertension     Current Outpatient Medications  Medication Sig Dispense Refill  . amLODipine (NORVASC) 5 MG tablet TAKE 1 TABLET BY MOUTH EVERY DAY (Patient taking differently: Take 5 mg by mouth daily. ) 90 tablet 0  . atorvastatin (LIPITOR) 20 MG tablet Take 1 tablet (20 mg total) by mouth daily. 90 tablet 3  . cephALEXin (KEFLEX) 500 MG capsule Take 1 capsule (500 mg total) by mouth 4 (four) times daily for 7 days. 28 capsule 0  . Ensure Max Protein (ENSURE MAX PROTEIN) LIQD Take 330 mLs (11 oz total) by mouth 2 (two) times daily between meals. 330 mL 0  . glipiZIDE (GLUCOTROL) 10 MG tablet Take 1 tablet (10 mg total) by mouth daily before breakfast. 60 tablet 2  . levofloxacin (LEVAQUIN) 750 MG tablet Take 1 tablet (750 mg total) by mouth daily. 7 tablet 0  . losartan (COZAAR) 50 MG tablet Take 2 tablets (100 mg total) by mouth daily.    . Multiple Vitamins-Minerals (WOMENS MULTIVITAMIN PO)  Take 1 tablet by mouth daily.     . Vitamin D, Ergocalciferol, (DRISDOL) 1.25 MG (50000 UT) CAPS capsule Take 1 capsule (50,000 Units total) by mouth every 7 (seven) days. 12 capsule 0   No current facility-administered medications for this visit.     No Known Allergies  Family History  Problem Relation Age of Onset  . Heart disease Father   . Heart attack Father   . Heart disease Maternal Grandfather     Social History   Socioeconomic History  . Marital status: Married    Spouse name: Not on file  . Number of children: Not on file  . Years of education: Not on file  . Highest education level: Not on file  Occupational History  . Not on file  Social Needs  . Financial resource strain: Not on file  . Food insecurity    Worry: Not on file    Inability: Not on file  . Transportation needs    Medical: Not on file    Non-medical: Not on file  Tobacco Use  . Smoking status: Never Smoker  . Smokeless tobacco: Never Used  Substance and Sexual Activity  . Alcohol use: Yes    Comment: recently quit 01/2018, 3-4 beers QD  . Drug use: Never  . Sexual activity: Not on file  Lifestyle  .  Physical activity    Days per week: Not on file    Minutes per session: Not on file  . Stress: Not on file  Relationships  . Social Herbalist on phone: Not on file    Gets together: Not on file    Attends religious service: Not on file    Active member of club or organization: Not on file    Attends meetings of clubs or organizations: Not on file    Relationship status: Not on file  . Intimate partner violence    Fear of current or ex partner: Not on file    Emotionally abused: Not on file    Physically abused: Not on file    Forced sexual activity: Not on file  Other Topics Concern  . Not on file  Social History Narrative  . Not on file     Constitutional: Denies fever, malaise, fatigue, headache or abrupt weight changes.  Respiratory: Denies difficulty breathing,  shortness of breath, cough or sputum production.   Cardiovascular: Denies chest pain, chest tightness, palpitations or swelling in the hands or feet.  Musculoskeletal: Pt reports partial amputation of right foot. Denies decrease in range of motion, muscle pain or joint pain and swelling.  Neurological: Denies dizziness, difficulty with memory, difficulty with speech or problems with balance and coordination.    No other specific complaints in a complete review of systems (except as listed in HPI above).  Objective:   Physical Exam  BP 124/84   Pulse 100   Temp 97.7 F (36.5 C) (Temporal)   SpO2 100%  Wt Readings from Last 3 Encounters:  11/09/18 213 lb 4.8 oz (96.8 kg)  10/16/18 223 lb (101.2 kg)  09/15/18 221 lb 12 oz (100.6 kg)    General: Appears her stated age, obese, in NAD. Skin: Wound not visualized. Freshly wrapped from podiatrist. Cardiovascular: Tachycardic with rhythm. S1,S2 noted.  No murmur, rubs or gallops noted.  Pulmonary/Chest: Normal effort and positive vesicular breath sounds. No respiratory distress. No wheezes, rales or ronchi noted.  Musculoskeletal: Right foot in post op [place]ed. Using walker for assistance with gait. Neurological: Alert and oriented.    BMET    Component Value Date/Time   NA 137 11/11/2018 0356   K 3.8 11/11/2018 0356   CL 104 11/11/2018 0356   CO2 26 11/11/2018 0356   GLUCOSE 203 (H) 11/11/2018 0356   BUN 17 11/11/2018 0356   CREATININE 0.83 11/11/2018 0356   CREATININE 0.93 09/15/2018 1545   CALCIUM 8.6 (L) 11/11/2018 0356   GFRNONAA >60 11/11/2018 0356   GFRAA >60 11/11/2018 0356    Lipid Panel     Component Value Date/Time   CHOL 201 (H) 09/15/2018 1545   TRIG 195 (H) 09/15/2018 1545   HDL 60 09/15/2018 1545   CHOLHDL 3.4 09/15/2018 1545   VLDL 19.6 06/06/2018 0912   LDLCALC 110 (H) 09/15/2018 1545    CBC    Component Value Date/Time   WBC 13.0 (H) 11/11/2018 0356   RBC 3.35 (L) 11/11/2018 0356   HGB 9.9 (L)  11/11/2018 0356   HCT 30.2 (L) 11/11/2018 0356   PLT 186 11/11/2018 0356   MCV 90.1 11/11/2018 0356   MCH 29.6 11/11/2018 0356   MCHC 32.8 11/11/2018 0356   RDW 12.5 11/11/2018 0356    Hgb A1C Lab Results  Component Value Date   HGBA1C 6.7 (H) 11/09/2018            Assessment &  Plan:   Hospital Follow Up for Osteomyelitis of Right Foot s/p Partial Amputation:  Hospital notes, labs and imaging reviewed She will continue current meds She will continue to follow up with podiatry as planned She will continue walker until things healed Discussed the importance of tight blood sugar controls, especially during the healing process  Return precautions discussed Webb Silversmith, NP

## 2018-11-20 ENCOUNTER — Ambulatory Visit: Payer: BC Managed Care – PPO | Admitting: Internal Medicine

## 2018-11-21 ENCOUNTER — Encounter: Payer: Self-pay | Admitting: Infectious Diseases

## 2018-11-21 ENCOUNTER — Ambulatory Visit: Payer: BC Managed Care – PPO | Attending: Infectious Diseases | Admitting: Infectious Diseases

## 2018-11-21 ENCOUNTER — Other Ambulatory Visit: Payer: Self-pay

## 2018-11-21 VITALS — BP 153/86 | HR 109 | Temp 98.3°F | Ht 66.0 in | Wt 215.0 lb

## 2018-11-21 DIAGNOSIS — Z79899 Other long term (current) drug therapy: Secondary | ICD-10-CM

## 2018-11-21 DIAGNOSIS — E11628 Type 2 diabetes mellitus with other skin complications: Secondary | ICD-10-CM | POA: Diagnosis not present

## 2018-11-21 DIAGNOSIS — Z792 Long term (current) use of antibiotics: Secondary | ICD-10-CM

## 2018-11-21 DIAGNOSIS — B954 Other streptococcus as the cause of diseases classified elsewhere: Secondary | ICD-10-CM | POA: Diagnosis not present

## 2018-11-21 DIAGNOSIS — B9561 Methicillin susceptible Staphylococcus aureus infection as the cause of diseases classified elsewhere: Secondary | ICD-10-CM

## 2018-11-21 DIAGNOSIS — Z89421 Acquired absence of other right toe(s): Secondary | ICD-10-CM

## 2018-11-21 DIAGNOSIS — Z7984 Long term (current) use of oral hypoglycemic drugs: Secondary | ICD-10-CM

## 2018-11-21 DIAGNOSIS — L089 Local infection of the skin and subcutaneous tissue, unspecified: Secondary | ICD-10-CM | POA: Diagnosis not present

## 2018-11-21 DIAGNOSIS — I10 Essential (primary) hypertension: Secondary | ICD-10-CM

## 2018-11-21 NOTE — Progress Notes (Signed)
NAME: Rebecca Park  DOB: 09/18/1960  MRN: [id number]  Date/Time: 11/21/2018 11:30 AM  REQUESTING PROVIDER: Dr. Luana Shu Subjective:  REASON FOR CONSULT: Right foot infection ? Rebecca Park is a 58 y.o. female with a history of diabetes mellitus, hypertension was recently in hospital between 11/08/2018 until 11/12/2018 right foot infection for which she underwent partial fifth ray amputation on 11/10/2018 along with bone culture off the fourth metatarsal head.  She was sent home on Levaquin and penicillin before the cultures were finalized.  The cultures came back as Streptococcus anginosis and staph aureus.  So the penicillin was changed to Keflex by the pharmacist.  I am asked to see the patient for further antibiotic management.. Patient is here with her mother.  She says she sustained an injury when she was working in the yard mid July and she was managing it locally.  She saw her PCP on October 24, 2018 and was noted to have distal laceration.  She had given her Keflex 5 mg 3 times a day for 10 days.  Patient says the wound was looking better with the Keflex.  Then later after 3 weeks she started to notice some swelling erythema and the wound turned bad very quickly.  She was referred to the wound care clinic and an MRI was done on 11/07/2018 which showed extensive subcutaneous edema centered at the level of the fourth and fifth digits and high suspicion for osteomyelitis of the fifth toe proximal phalanx.  She was asked to go to the ED and was admitted between 11/08/2018 until 11/12/2018.Marland Kitchen Past Medical History:  Diagnosis Date  . Diabetes mellitus without complication ([place])   . Hypertension     Past Surgical History:  Procedure Laterality Date  . AMPUTATION Right 11/10/2018   Procedure: AMPUTATION RAY (amputation 5th metatarsal Right foot;  Surgeon: Caroline More, DPM;  Location: [facility] ORS;  Service: Podiatry;  Laterality: Right;  . BONE BIOPSY Right 11/10/2018   Procedure: BONE BIOPSY- Right 4th toe;   Surgeon: Caroline More, DPM;  Location: [facility] ORS;  Service: Podiatry;  Laterality: Right;  . BUNIONECTOMY Bilateral   . COLONOSCOPY WITH PROPOFOL N/A 09/07/2018   Procedure: COLONOSCOPY WITH PROPOFOL;  Surgeon: Lin Landsman, MD;  Location[facility] END[facility]COPY;  Service: Gastroenterology;  Laterality: N/A;    Social History   Socioeconomic History  . Marital status: Married    Spouse name: Not on file  . Number of children: Not on file  . Years of education: Not on file  . Highest education level: Not on file  Occupational History  . Not on file  Social Needs  . Financial resource strain: Not on file  . Food insecurity    Worry: Not on file    Inability: Not on file  . Transportation needs    Medical: Not on file    Non-medical: Not on file  Tobacco Use  . Smoking status: Never Smoker  . Smokeless tobacco: Never Used  Substance and Sexual Activity  . Alcohol use: Yes    Comment: recently quit 01/2018, 3-4 beers QD  . Drug use: Never  . Sexual activity: Not on file  Lifestyle  . Physical activity    Days per week: Not on file    Minutes per session: Not on file  . Stress: Not on file  Relationships  . Social Herbalist on phone: Not on file    Gets together: Not on file    Attends religious service: Not  on file    Active member of club or organization: Not on file    Attends meetings of clubs or organizations: Not on file    Relationship status: Not on file  . Intimate partner violence    Fear of current or ex partner: Not on file    Emotionally abused: Not on file    Physically abused: Not on file    Forced sexual activity: Not on file  Other Topics Concern  . Not on file  Social History Narrative  . Not on file    Family History  Problem Relation Age of Onset  . Heart disease Father   . Heart attack Father   . Heart disease Maternal Grandfather    No Known Allergies   ? Current Outpatient Medications  Medication Sig Dispense Refill  .  amLODipine (NORVASC) 5 MG tablet TAKE 1 TABLET BY MOUTH EVERY DAY (Patient taking differently: Take 5 mg by mouth daily. ) 90 tablet 0  . atorvastatin (LIPITOR) 20 MG tablet Take 1 tablet (20 mg total) by mouth daily. 90 tablet 3  . cephALEXin (KEFLEX) 500 MG capsule Take 1 capsule (500 mg total) by mouth 4 (four) times daily for 7 days. 28 capsule 0  . Ensure Max Protein (ENSURE MAX PROTEIN) LIQD Take 330 mLs (11 oz total) by mouth 2 (two) times daily between meals. 330 mL 0  . glipiZIDE (GLUCOTROL) 10 MG tablet Take 1 tablet (10 mg total) by mouth daily before breakfast. 60 tablet 2  . losartan (COZAAR) 50 MG tablet Take 2 tablets (100 mg total) by mouth daily.    . Multiple Vitamins-Minerals (WOMENS MULTIVITAMIN PO) Take 1 tablet by mouth daily.     . Vitamin D, Ergocalciferol, (DRISDOL) 1.25 MG (50000 UT) CAPS capsule Take 1 capsule (50,000 Units total) by mouth every 7 (seven) days. 12 capsule 0  . levofloxacin (LEVAQUIN) 750 MG tablet Take 1 tablet (750 mg total) by mouth daily. (Patient not taking: Reported on 11/21/2018) 7 tablet 0   No current facility-administered medications for this visit.      Abtx:  Anti-infectives (From admission, onward)   None      REVIEW OF SYSTEMS:  Const: negative fever, negative chills, negative weight loss Eyes: negative diplopia or visual changes, negative eye pain ENT: negative coryza, negative sore throat Resp: negative cough, hemoptysis, dyspnea Cards: negative for chest pain, palpitations, lower extremity edema GU: negative for frequency, dysuria and hematuria GI: Negative for abdominal pain, diarrhea, bleeding, constipation Skin: negative for rash and pruritus Heme: negative for easy bruising and gum/nose bleeding MS: negative for myalgias, arthralgias, back pain and muscle weakness Neurolo:negative for headaches, dizziness, vertigo, memory problems  Psych: negative for feelings of anxiety, depression  Endocrine: Has diabetes and states is  well controlled. Allergy/Immunology-as above Objective:  VITALS:  BP (!) 153/86 (BP Location: Right Arm, Patient Position: Sitting, Cuff Size: Normal)   Pulse (!) 109   Temp 98.3 F (36.8 C) (Oral)   Ht 5\' 6"  (1.676 m)   Wt 215 lb (97.5 kg)   BMI 34.70 kg/m  PHYSICAL EXAM:  General: Alert, cooperative, no distress, appears stated age.  Head: Normocephalic, without obvious abnormality, atraumatic. Eyes: Conjunctivae clear, anicteric sclerae. Pupils are equal ENT did not examine because of mask Neck: Supple, symmetrical, no adenopathy, thyroid: non tender no carotid bruit and no JVD. Back: No CVA tenderness. Lungs: Clear to auscultation bilaterally. No Wheezing or Rhonchi. No rales. Heart: Regular rate and rhythm, no murmur, rub  or gallop. Abdomen: Soft, non-tender,not distended. Bowel sounds normal. No masses Extremities: Right foot dressing removed       Fifth toe amputation site sutures are well coapted.  There is edema of the dorsum of the foot.  There is some duskiness over the site.  Minimal dried up bloody discharge on the dressing.  Dorsalis pedis felt Skin: No rashes or lesions. Or bruising Lymph: Cervical, supraclavicular normal. Neurologic: Grossly non-focal Pertinent Labs Lab Results CBC    Component Value Date/Time   WBC 13.0 (H) 11/11/2018 0356   RBC 3.35 (L) 11/11/2018 0356   HGB 9.9 (L) 11/11/2018 0356   HCT 30.2 (L) 11/11/2018 0356   PLT 186 11/11/2018 0356   MCV 90.1 11/11/2018 0356   MCH 29.6 11/11/2018 0356   MCHC 32.8 11/11/2018 0356   RDW 12.5 11/11/2018 0356    CMP Latest Ref Rng & Units 11/11/2018 11/10/2018 11/10/2018  Glucose 70 - 99 mg/dL 203(H) - -  BUN 6 - 20 mg/dL 17 - -  Creatinine 0.44 - 1.00 mg/dL 0.83 - -  Sodium 135 - 145 mmol/L 137 - -  Potassium 3.5 - 5.1 mmol/L 3.8 3.0(L) 2.9(L)  Chloride 98 - 111 mmol/L 104 - -  CO2 22 - 32 mmol/L 26 - -  Calcium 8.9 - 10.3 mg/dL 8.6(L) - -  Total Protein 6.1 - 8.1 g/dL - - -  Total  Bilirubin 0.2 - 1.2 mg/dL - - -  Alkaline Phos 39 - 117 U/L - - -  AST 10 - 35 U/L - - -  ALT 6 - 29 U/L - - -      Microbiology: Reviewed culture report from 11/10/2018  IMAGING RESULTS: MRI of the right foot reviewed.  Report as above. I have personally reviewed the films ? Impression/Recommendation ? ?Right foot infection following trauma.  Staph aureus and strep anginosus in the culture.  Status post fifth toe amputation.  She is currently on oral Keflex 500 mg every 6. There is some edema of the foot with some duskiness.  Asked her to keep the leg elevated.  She will be seeing Dr. Luana Shu on Friday.  He will assess the wound and then we can decide whether to continue oral antibiotics or or evaluate the need for IV antibiotic.  Diabetes mellitus on glipizide.  Hypertension on losartan.  Was on hydrochlorothiazide which was stopped because of hypokalemia.  That may also contribute to the swelling of the leg.  Asked her to discuss with her PCP? ___________________________________________________ Discussed with patient, mom and Dr. Luana Shu Note:  This document was prepared using Dragon voice recognition software and may include unintentional dictation errors.

## 2018-11-21 NOTE — Patient Instructions (Signed)
You are here for a rt foot infection- you had surgery on 8/16 and underwnet 5th ray excision- culture positive for staph aureus and streptococcus and you are on keflex. The foot is swollen- keep the foot elevated and on Friday when you see Dr.Baker we will discuss- continue keflex until then.

## 2018-12-01 ENCOUNTER — Other Ambulatory Visit
Admission: RE | Admit: 2018-12-01 | Discharge: 2018-12-01 | Disposition: A | Discharge status: Home / Self Care | Payer: BC Managed Care – PPO | Source: Ambulatory Visit | Attending: Podiatry | Admitting: Podiatry

## 2018-12-01 ENCOUNTER — Other Ambulatory Visit: Payer: Self-pay

## 2018-12-01 ENCOUNTER — Other Ambulatory Visit: Payer: Self-pay | Admitting: Podiatry

## 2018-12-01 DIAGNOSIS — Z20828 Contact with and (suspected) exposure to other viral communicable diseases: Secondary | ICD-10-CM | POA: Diagnosis not present

## 2018-12-01 DIAGNOSIS — Z89421 Acquired absence of other right toe(s): Secondary | ICD-10-CM | POA: Diagnosis not present

## 2018-12-01 DIAGNOSIS — M86271 Subacute osteomyelitis, right ankle and foot: Secondary | ICD-10-CM | POA: Diagnosis not present

## 2018-12-01 DIAGNOSIS — Z01812 Encounter for preprocedural laboratory examination: Secondary | ICD-10-CM | POA: Diagnosis not present

## 2018-12-01 DIAGNOSIS — E1142 Type 2 diabetes mellitus with diabetic polyneuropathy: Secondary | ICD-10-CM | POA: Diagnosis not present

## 2018-12-01 DIAGNOSIS — T8131XA Disruption of external operation (surgical) wound, not elsewhere classified, initial encounter: Secondary | ICD-10-CM | POA: Diagnosis not present

## 2018-12-01 LAB — SARS CORONAVIRUS 2 (TAT 6-24 HRS): SARS Coronavirus 2: NEGATIVE

## 2018-12-01 NOTE — Anesthesia Preprocedure Evaluation (Addendum)
Anesthesia Evaluation  Patient identified by MRN, date of birth, ID band Patient awake    Reviewed: Allergy & Precautions, NPO status , Patient's Chart, lab work & pertinent test results  History of Anesthesia Complications (+) PONV and history of anesthetic complications  Airway Mallampati: III   Neck ROM: Full    Dental  (+)    Pulmonary neg pulmonary ROS,    Pulmonary exam normal breath sounds clear to auscultation       Cardiovascular hypertension, Normal cardiovascular exam Rhythm:Regular Rate:Normal     Neuro/Psych negative neurological ROS     GI/Hepatic negative GI ROS,   Endo/Other  diabetes, Type 2, Insulin Dependent  Renal/GU negative Renal ROS     Musculoskeletal Right foot osteomyelitis   Abdominal   Peds  Hematology negative hematology ROS (+)   Anesthesia Other Findings   Reproductive/Obstetrics                            Anesthesia Physical Anesthesia Plan  ASA: III  Anesthesia Plan: General   Post-op Pain Management:    Induction: Intravenous  PONV Risk Score and Plan: 3 and Propofol infusion, TIVA and Scopolamine patch - Pre-op  Airway Management Planned: Natural Airway  Additional Equipment:   Intra-op Plan:   Post-operative Plan:   Informed Consent: I have reviewed the patients History and Physical, chart, labs and discussed the procedure including the risks, benefits and alternatives for the proposed anesthesia with the patient or authorized representative who has indicated his/her understanding and acceptance.       Plan Discussed with: CRNA  Anesthesia Plan Comments:       Anesthesia Quick Evaluation

## 2018-12-01 NOTE — Addendum Note (Signed)
Addended by: Caroline More on: 12/01/2018 01:15 PM   Modules accepted: Orders

## 2018-12-05 ENCOUNTER — Other Ambulatory Visit: Payer: Self-pay

## 2018-12-05 ENCOUNTER — Encounter
Admission: RE | Disposition: A | Discharge status: Home / Self Care | Payer: Self-pay | Source: Home / Self Care | Attending: Podiatry

## 2018-12-05 ENCOUNTER — Ambulatory Visit: Payer: BC Managed Care – PPO | Admitting: Anesthesiology

## 2018-12-05 ENCOUNTER — Ambulatory Visit
Admission: RE | Admit: 2018-12-05 | Discharge: 2018-12-05 | Disposition: A | Discharge status: Home / Self Care | Payer: BC Managed Care – PPO | Attending: Podiatry | Admitting: Podiatry

## 2018-12-05 DIAGNOSIS — E11621 Type 2 diabetes mellitus with foot ulcer: Secondary | ICD-10-CM | POA: Insufficient documentation

## 2018-12-05 DIAGNOSIS — Z79899 Other long term (current) drug therapy: Secondary | ICD-10-CM | POA: Diagnosis not present

## 2018-12-05 DIAGNOSIS — M86171 Other acute osteomyelitis, right ankle and foot: Secondary | ICD-10-CM

## 2018-12-05 DIAGNOSIS — I10 Essential (primary) hypertension: Secondary | ICD-10-CM | POA: Diagnosis not present

## 2018-12-05 DIAGNOSIS — L03115 Cellulitis of right lower limb: Secondary | ICD-10-CM | POA: Insufficient documentation

## 2018-12-05 DIAGNOSIS — E1169 Type 2 diabetes mellitus with other specified complication: Secondary | ICD-10-CM | POA: Insufficient documentation

## 2018-12-05 DIAGNOSIS — E1142 Type 2 diabetes mellitus with diabetic polyneuropathy: Secondary | ICD-10-CM | POA: Insufficient documentation

## 2018-12-05 DIAGNOSIS — E785 Hyperlipidemia, unspecified: Secondary | ICD-10-CM | POA: Insufficient documentation

## 2018-12-05 DIAGNOSIS — Z7984 Long term (current) use of oral hypoglycemic drugs: Secondary | ICD-10-CM | POA: Insufficient documentation

## 2018-12-05 DIAGNOSIS — Z89421 Acquired absence of other right toe(s): Secondary | ICD-10-CM | POA: Insufficient documentation

## 2018-12-05 DIAGNOSIS — T8131XA Disruption of external operation (surgical) wound, not elsewhere classified, initial encounter: Secondary | ICD-10-CM | POA: Diagnosis not present

## 2018-12-05 DIAGNOSIS — M86271 Subacute osteomyelitis, right ankle and foot: Secondary | ICD-10-CM | POA: Diagnosis not present

## 2018-12-05 DIAGNOSIS — L97519 Non-pressure chronic ulcer of other part of right foot with unspecified severity: Secondary | ICD-10-CM | POA: Insufficient documentation

## 2018-12-05 HISTORY — DX: Other specified postprocedural states: R11.2

## 2018-12-05 HISTORY — PX: AMPUTATION TOE: SHX6595

## 2018-12-05 HISTORY — DX: Other specified postprocedural states: Z98.890

## 2018-12-05 LAB — GLUCOSE, CAPILLARY
Glucose-Capillary: 238 mg/dL — ABNORMAL HIGH (ref 70–99)
Glucose-Capillary: 244 mg/dL — ABNORMAL HIGH (ref 70–99)
Glucose-Capillary: 185 mg/dL — ABNORMAL HIGH (ref 70–99)

## 2018-12-05 SURGERY — AMPUTATION, TOE
Anesthesia: General | Site: Toe | Laterality: Right

## 2018-12-05 MED ORDER — ONDANSETRON HCL 4 MG/2ML IJ SOLN: 4.0000 mg | Freq: Once | INTRAMUSCULAR | Status: DC | PRN

## 2018-12-05 MED ORDER — LIDOCAINE HCL (PF) 1 % IJ SOLN
INTRAMUSCULAR | Status: DC | PRN
  Administered 2018-12-05: 20 mL

## 2018-12-05 MED ORDER — POVIDONE-IODINE 7.5 % EX SOLN: Freq: Once | CUTANEOUS | Status: DC

## 2018-12-05 MED ORDER — MIDAZOLAM HCL 2 MG/2ML IJ SOLN
INTRAMUSCULAR | Status: DC | PRN
  Administered 2018-12-05: 2 mg via INTRAVENOUS

## 2018-12-05 MED ORDER — PROPOFOL 10 MG/ML IV BOLUS
INTRAVENOUS | Status: DC | PRN
  Administered 2018-12-05: 50 mg via INTRAVENOUS

## 2018-12-05 MED ORDER — FENTANYL CITRATE (PF) 100 MCG/2ML IJ SOLN
INTRAMUSCULAR | Status: DC | PRN
  Administered 2018-12-05: 50 ug via INTRAVENOUS

## 2018-12-05 MED ORDER — POVIDONE-IODINE 10 % EX SWAB: 2.0000 "application " | Freq: Once | CUTANEOUS | Status: DC

## 2018-12-05 MED ORDER — PHENYLEPHRINE HCL (PRESSORS) 10 MG/ML IV SOLN
INTRAVENOUS | Status: DC | PRN
  Administered 2018-12-05 (×2): 100 ug via INTRAVENOUS
  Administered 2018-12-05: 50 ug via INTRAVENOUS

## 2018-12-05 MED ORDER — LIDOCAINE HCL (CARDIAC) PF 100 MG/5ML IV SOSY
PREFILLED_SYRINGE | INTRAVENOUS | Status: DC | PRN
  Administered 2018-12-05: 20 mg via INTRATRACHEAL

## 2018-12-05 MED ORDER — CEFAZOLIN SODIUM-DEXTROSE 2-4 GM/100ML-% IV SOLN
2.0000 g | INTRAVENOUS | Status: AC
  Administered 2018-12-05: 10:00:00 2 g via INTRAVENOUS

## 2018-12-05 MED ORDER — OXYCODONE HCL 5 MG/5ML PO SOLN: 5.0000 mg | Freq: Once | ORAL | Status: DC | PRN

## 2018-12-05 MED ORDER — ACETAMINOPHEN 10 MG/ML IV SOLN: 1000.0000 mg | Freq: Once | INTRAVENOUS | Status: DC | PRN

## 2018-12-05 MED ORDER — SCOPOLAMINE 1 MG/3DAYS TD PT72
1.0000 | MEDICATED_PATCH | Freq: Once | TRANSDERMAL | Status: DC
  Administered 2018-12-05: 1.5 mg via TRANSDERMAL

## 2018-12-05 MED ORDER — PROPOFOL 500 MG/50ML IV EMUL
INTRAVENOUS | Status: DC | PRN
  Administered 2018-12-05: 75 ug/kg/min via INTRAVENOUS

## 2018-12-05 MED ORDER — NON FORMULARY
5.0000 [IU] | Freq: Once | Status: AC
  Administered 2018-12-05: 5 [IU] via INTRAVENOUS

## 2018-12-05 MED ORDER — OXYCODONE HCL 5 MG PO TABS: 5.0000 mg | ORAL_TABLET | Freq: Once | ORAL | Status: DC | PRN

## 2018-12-05 MED ORDER — NON FORMULARY
5.0000 [IU] | Freq: Once | Status: AC
  Administered 2018-12-05: 09:00:00 5 [IU] via INTRAVENOUS

## 2018-12-05 MED ORDER — GLYCOPYRROLATE 0.2 MG/ML IJ SOLN
INTRAMUSCULAR | Status: DC | PRN
  Administered 2018-12-05: 0.1 mg via INTRAVENOUS

## 2018-12-05 MED ORDER — 0.9 % SODIUM CHLORIDE (POUR BTL) OPTIME
TOPICAL | Status: DC | PRN
  Administered 2018-12-05: 10:00:00 500 mL

## 2018-12-05 MED ORDER — FENTANYL CITRATE (PF) 100 MCG/2ML IJ SOLN: 25.0000 ug | INTRAMUSCULAR | Status: DC | PRN

## 2018-12-05 MED ORDER — CEPHALEXIN 500 MG PO CAPS: 500.0000 mg | ORAL_CAPSULE | Freq: Three times a day (TID) | ORAL | 0 refills | Status: AC

## 2018-12-05 MED ORDER — BUPIVACAINE HCL (PF) 0.5 % IJ SOLN
INTRAMUSCULAR | Status: DC | PRN
  Administered 2018-12-05: 10 mL

## 2018-12-05 MED ORDER — ONDANSETRON HCL 4 MG PO TABS: 4.0000 mg | ORAL_TABLET | Freq: Every day | ORAL | 0 refills | Status: AC | PRN

## 2018-12-05 MED ORDER — LACTATED RINGERS IV SOLN
100.0000 mL/h | INTRAVENOUS | Status: DC
  Administered 2018-12-05: 09:00:00 100 mL/h via INTRAVENOUS

## 2018-12-05 MED ORDER — ONDANSETRON HCL 4 MG/2ML IJ SOLN
INTRAMUSCULAR | Status: DC | PRN
  Administered 2018-12-05: 4 mg via INTRAVENOUS

## 2018-12-05 MED ORDER — HYDROCODONE-ACETAMINOPHEN 5-325 MG PO TABS: 1.0000 | ORAL_TABLET | Freq: Four times a day (QID) | ORAL | 0 refills | Status: AC | PRN

## 2018-12-05 MED ORDER — LACTATED RINGERS IV SOLN: INTRAVENOUS | Status: DC

## 2018-12-05 MED ORDER — CHLORHEXIDINE GLUCONATE 4 % EX LIQD: 60.0000 mL | Freq: Once | CUTANEOUS | Status: DC

## 2018-12-05 SURGICAL SUPPLY — 50 items
BANDAGE ELASTIC 4 LF NS (GAUZE/BANDAGES/DRESSINGS) ×3 IMPLANT
BENZOIN TINCTURE PRP APPL 2/3 (GAUZE/BANDAGES/DRESSINGS) ×3 IMPLANT
BLADE MED AGGRESSIVE (BLADE) IMPLANT
BLADE OSC/SAGITTAL 5.5X25 (BLADE) IMPLANT
BLADE OSC/SAGITTAL MD 5.5X18 (BLADE) IMPLANT
BLADE OSC/SAGITTAL MD 9X18.5 (BLADE) ×2 IMPLANT
BNDG COHESIVE 4X5 TAN STRL (GAUZE/BANDAGES/DRESSINGS) ×3 IMPLANT
BNDG CONFORM 3 STRL LF (GAUZE/BANDAGES/DRESSINGS) ×2 IMPLANT
BNDG ELASTIC 4X5.8 VLCR STR LF (GAUZE/BANDAGES/DRESSINGS) ×2 IMPLANT
BNDG ESMARK 6X12 TAN STRL LF (GAUZE/BANDAGES/DRESSINGS) ×3 IMPLANT
BNDG GAUZE 4.5X4.1 6PLY STRL (MISCELLANEOUS) ×3 IMPLANT
BNDG STRETCH 4X75 STRL LF (GAUZE/BANDAGES/DRESSINGS) ×3 IMPLANT
CANISTER SUCT 1200ML W/VALVE (MISCELLANEOUS) ×3 IMPLANT
CLOSURE WOUND 1/4X4 (GAUZE/BANDAGES/DRESSINGS)
CUFF TOURN SGL QUICK 18X4 (TOURNIQUET CUFF) IMPLANT
DURAPREP 26ML APPLICATOR (WOUND CARE) ×3 IMPLANT
ELECT REM PT RETURN 9FT ADLT (ELECTROSURGICAL) ×3
ELECTRODE REM PT RTRN 9FT ADLT (ELECTROSURGICAL) ×1 IMPLANT
GAUZE PETRO XEROFOAM 1X8 (MISCELLANEOUS) ×3 IMPLANT
GAUZE SPONGE 4X4 12PLY STRL (GAUZE/BANDAGES/DRESSINGS) ×3 IMPLANT
GAUZE XEROFORM 1X8 LF (GAUZE/BANDAGES/DRESSINGS) ×2 IMPLANT
GLOVE BIO SURGEON STRL SZ7 (GLOVE) ×2 IMPLANT
GLOVE BIO SURGEON STRL SZ7.5 (GLOVE) ×3 IMPLANT
GLOVE BIOGEL PI IND STRL 7.0 (GLOVE) IMPLANT
GLOVE BIOGEL PI INDICATOR 7.0 (GLOVE) ×2
GLOVE INDICATOR 8.0 STRL GRN (GLOVE) ×1 IMPLANT
GOWN STRL REUS W/ TWL LRG LVL3 (GOWN DISPOSABLE) ×2 IMPLANT
GOWN STRL REUS W/TWL LRG LVL3 (GOWN DISPOSABLE) ×4
KIT TURNOVER KIT A (KITS) ×3 IMPLANT
NDL HYPO 18GX1.5 BLUNT FILL (NEEDLE) IMPLANT
NDL HYPO 25GX1X1/2 BEV (NEEDLE) IMPLANT
NEEDLE HYPO 18GX1.5 BLUNT FILL (NEEDLE) IMPLANT
NEEDLE HYPO 25GX1X1/2 BEV (NEEDLE) IMPLANT
PACK EXTREMITY ARMC (MISCELLANEOUS) ×3 IMPLANT
PENCIL SMOKE EVACUATOR (MISCELLANEOUS) ×3 IMPLANT
RASP SM TEAR CROSS CUT (RASP) ×1 IMPLANT
STOCKINETTE IMPERVIOUS LG (DRAPES) ×3 IMPLANT
STRIP CLOSURE SKIN 1/4X4 (GAUZE/BANDAGES/DRESSINGS) ×1 IMPLANT
SUT ETHILON 3-0 FS-10 30 BLK (SUTURE) ×6
SUT ETHILON 4-0 (SUTURE)
SUT ETHILON 4-0 FS2 18XMFL BLK (SUTURE)
SUT ETHILON 5-0 FS-2 18 BLK (SUTURE) IMPLANT
SUT VIC AB 2-0 SH 27 (SUTURE)
SUT VIC AB 2-0 SH 27XBRD (SUTURE) IMPLANT
SUT VIC AB 3-0 SH 27 (SUTURE) ×4
SUT VIC AB 3-0 SH 27X BRD (SUTURE) IMPLANT
SUT VIC AB 4-0 FS2 27 (SUTURE) IMPLANT
SUTURE EHLN 3-0 FS-10 30 BLK (SUTURE) IMPLANT
SUTURE ETHLN 4-0 FS2 18XMF BLK (SUTURE) IMPLANT
SYR 10ML LL (SYRINGE) IMPLANT

## 2018-12-05 NOTE — Discharge Instructions (Signed)
[address] [place]   1. Take your medication as prescribed.  Pain medication should be taken only as needed.  Take antinausea medication as prescribed as needed.  Take antibiotics as prescribed, Keflex 500mg  1 orally 3x a day until gone for 7d course  2. Keep the dressing clean, dry and intact.  If any drainage, strikethrough, saturation, or loosening, contact office for instructions.  3. Keep your foot elevated above the heart level for the first 48 hours.  4. Walking to the bathroom and brief periods of walking are acceptable, unless we have instructed you to be non-weight bearing.  5. Always wear your post-op [place] when walking.  Always use your crutches if you are to be non-weight bearing.  6. Do not take a shower. Baths are permissible as long as the foot is kept out of the water.   7. Every hour you are awake:  - Bend your knee 15 times. - Flex foot 15 times - Massage calf 15 times  8. Call [facility] [phone]) if any of the following problems occur: make appointment for 1 week postop if you have not already. - You develop a temperature or fever. - The bandage becomes saturated with blood. - Medication does not stop your pain. - Injury of the foot occurs. - Any symptoms of infection including redness, odor, or red streaks running from wound.   General Anesthesia, Adult, Care After This sheet gives you information about how to care for yourself after your procedure. Your health care provider may also give you more specific instructions. If you have problems or questions, contact your health care provider. What can I expect after the procedure? After the procedure, the following side effects are common:  Pain or discomfort at the IV site.  Nausea.  Vomiting.  Sore throat.  Trouble concentrating.  Feeling cold or chills.  Weak  or tired.  Sleepiness and fatigue.  Soreness and body aches. These side effects can affect parts of the body that were not involved in surgery. Follow these instructions at home:  For at least 24 hours after the procedure:  Have a responsible adult stay with you. It is important to have someone help care for you until you are awake and alert.  Rest as needed.  Do not: ? Participate in activities in which you could fall or become injured. ? Drive. ? Use heavy machinery. ? Drink alcohol. ? Take sleeping pills or medicines that cause drowsiness. ? Make important decisions or sign legal documents. ? Take care of children on your own. Eating and drinking  Follow any instructions from your health care provider about eating or drinking restrictions.  When you feel hungry, start by eating small amounts of foods that are soft and easy to digest (bland), such as toast. Gradually return to your regular diet.  Drink enough fluid to keep your urine pale yellow.  If you vomit, rehydrate by drinking water, juice, or clear broth. General instructions  If you have sleep apnea, surgery and certain medicines can increase your risk for breathing problems. Follow instructions from your health care provider about wearing your sleep device: ? Anytime you are sleeping, including during daytime naps. ? While taking prescription pain medicines, sleeping medicines, or medicines that make you drowsy.  Return to your normal activities as told by your health care provider. Ask your health care provider what activities are safe for  you.  Take over-the-counter and prescription medicines only as told by your health care provider.  If you smoke, do not smoke without supervision.  Keep all follow-up visits as told by your health care provider. This is important. Contact a health care provider if:  You have nausea or vomiting that does not get better with medicine.  You cannot eat or drink without  vomiting.  You have pain that does not get better with medicine.  You are unable to pass urine.  You develop a skin rash.  You have a fever.  You have redness around your IV site that gets worse. Get help right away if:  You have difficulty breathing.  You have chest pain.  You have blood in your urine or stool, or you vomit blood. Summary  After the procedure, it is common to have a sore throat or nausea. It is also common to feel tired.  Have a responsible adult stay with you for the first 24 hours after general anesthesia. It is important to have someone help care for you until you are awake and alert.  When you feel hungry, start by eating small amounts of foods that are soft and easy to digest (bland), such as toast. Gradually return to your regular diet.  Drink enough fluid to keep your urine pale yellow.  Return to your normal activities as told by your health care provider. Ask your health care provider what activities are safe for you. This information is not intended to replace advice given to you by your health care provider. Make sure you discuss any questions you have with your health care provider. Document Released: 06/21/2000 Document Revised: 03/18/2017 Document Reviewed: 10/29/2016 Elsevier Patient Education  2020 [employer].

## 2018-12-05 NOTE — Anesthesia Procedure Notes (Addendum)
Procedure Name: MAC Date/Time: 12/05/2018 9:45 AM Performed by: Cameron Ali, CRNA Pre-anesthesia Checklist: Patient identified, Emergency Drugs available, Suction available, Patient being monitored and Timeout performed Patient Re-evaluated:Patient Re-evaluated prior to induction Oxygen Delivery Method: Simple face mask Placement Confirmation: positive ETCO2 and breath sounds checked- equal and bilateral

## 2018-12-05 NOTE — Anesthesia Postprocedure Evaluation (Signed)
Anesthesia Post Note  Patient: Rebecca Park  Procedure(s) Performed: RAY RIGHT 4TH AMPUTATION (Right Toe)  Patient location during evaluation: PACU Anesthesia Type: General Level of consciousness: awake and alert, oriented and patient cooperative Pain management: pain level controlled Vital Signs Assessment: post-procedure vital signs reviewed and stable Respiratory status: spontaneous breathing, nonlabored ventilation and respiratory function stable Cardiovascular status: blood pressure returned to baseline and stable Postop Assessment: adequate PO intake Anesthetic complications: no    Darrin Nipper

## 2018-12-05 NOTE — Transfer of Care (Signed)
Immediate Anesthesia Transfer of Care Note  Patient: Rebecca Park  Procedure(s) Performed: RAY RIGHT 4TH AMPUTATION (Right Toe)  Patient Location: PACU  Anesthesia Type: General  Level of Consciousness: awake, alert  and patient cooperative  Airway and Oxygen Therapy: Patient Spontanous Breathing and Patient connected to supplemental oxygen  Post-op Assessment: Post-op Vital signs reviewed, Patient's Cardiovascular Status Stable, Respiratory Function Stable, Patent Airway and No signs of Nausea or vomiting  Post-op Vital Signs: Reviewed and stable  Complications: No apparent anesthesia complications

## 2018-12-05 NOTE — H&P (Signed)
HISTORY AND PHYSICAL INTERVAL NOTE:  12/05/2018  9:24 AM  Rebecca Park  has presented today for surgery, with the diagnosis of M86.271 SUBACUTE OSTEOMYELITIS RIGHT ANKLE AND FOOT Z89.421 STATUS POST AMPUTATION TOE RIGHT FOOT T81.31XQ DEHISCENCE OF OPERATIVE WOUND E11.42 TYPE 2 DIABETES.  The various methods of treatment have been discussed with the patient.  No guarantees were given.  After consideration of risks, benefits and other options for treatment, the patient has consented to surgery.  I have reviewed the patients' chart and labs.    Procedure: Right partial 4th ray amputation  A history and physical examination was performed in my office.  The patient was reexamined.  There have been no changes to this history and physical examination.  Caroline More

## 2018-12-05 NOTE — Op Note (Signed)
PODIATRY / FOOT AND ANKLE SURGERY OPERATIVE REPORT    SURGEON: Caroline More, DPM  PRE-OPERATIVE DIAGNOSIS:  1.  Right fourth MPJ osteomyelitis. 2.  Right foot ulceration fourth metatarsal phalangeal joint with necrosis of bone 3.  Right foot cellulitis 4.  Diabetes type 2 with polyneuropathy, uncontrolled  POST-OPERATIVE DIAGNOSIS: Same  PROCEDURE(S): 1. Right partial fourth ray amputation  HEMOSTASIS: Right ankle tourniquet  ANESTHESIA: MAC  ESTIMATED BLOOD LOSS: 10 cc  FINDING(S): 1.  Osteomyelitis right fourth metatarsal phalangeal joint  PATHOLOGY/SPECIMEN(S): Bone culture proximal phalanx right fourth toe base.  Pathologic specimen fourth metatarsal head with area marked proximally for margin examination  INDICATIONS:   Rebecca Park is a 58 y.o. female who presents with a nonhealing surgical incision to the right foot status post a partial fifth ray amputation.  Patient was seen at [facility] hospital due to a nonhealing ulceration between the fourth and fifth toes of the right foot.  X-ray and MRI imaging showed osteomyelitis of the right fifth toe and metatarsal head.  On clinical examination the wound appeared to probe to bone at the fifth MPJ and very close to the fourth MPJ.  X-ray and MRI imaging did not show osteomyelitis of the fourth MPJ at that time.  An amputation was performed to the right partial fifth ray and a bone biopsy was taken of the right fourth metatarsal head due to concern for possible osteomyelitis residually.  The patient was then seen in the clinic thereafter over the next 3 weeks and noted to have a nonhealing surgical wound.  The patient has seen infectious disease and has been on antibiotics during this time.  New x-ray imaging was then taken in clinic 3 weeks postop and notable changes were seen at the fourth MPJ consistent with osteomyelitis.  Discussed all treatment options with the patient at this time the patient has elected for surgery  consisting of right partial fourth ray amputation.  DESCRIPTION: After obtaining full informed written consent, the patient was brought back to the operating room and placed supine upon the operating table.  The patient received IV antibiotics prior to induction.  After obtaining adequate anesthesia, a preoperative block was performed with 20 cc of 1% lidocaine plain.  The patient was prepped and draped in the standard fashion.  An Esmarch bandage was used to exsanguinate the right lower extremity and the pneumatic ankle tourniquet was inflated.  Attention was then directed to the right fourth ray where a 3-1 type of elliptical incision was used over the area of the previous incision site and extending medially over the fourth toe around the base of the toe leaving some of the medial skin intact at the medial aspect of the toe.  This incision was made straight to bone and was extended dorsal proximally and plantar proximally to the fourth metatarsal midshaft.  At this time the fourth MPJ was identified and extensor tenotomy and capsulotomy was performed followed by release the collateral and suspensory ligaments and any connections to the plantar plate or flexor tendon.  The fourth toe was then disarticulated and passed off in the operative site.  The base the fourth toe was examined at the base the proximal phalanx and noted to have eroded type changes consistent with osteomyelitis.  A bone culture from this area was then sent off and passed off the operative site.  Circumferential dissection was then performed around the head of the fourth metatarsal to the level of the midshaft.  The head of the  fourth metatarsal appeared to be normal morphology and the area of the previous bone biopsy was able to be visualized.  The cartilage appeared to be 100% intact except for the area of the bone biopsy which was taken which was previously positive for bacterial growth.  A sagittal bone saw was then used to resect the  head and distal shaft of the fourth metatarsal.  This bone was then passed off from the surgical site and marked at the proximal margin and sent off for pathologic specimen.  The bone appeared to be hard in nature again with no signs of osteomyelitis and appeared to be healthy and bleeding.  The surgical site was flushed out with copious amounts normal sterile saline.  The deep subcutaneous and muscular type structures were reapproximated well coapted with 3-0 Vicryl.  The subcutaneous tissue was then reapproximated well coapted with 3-0 Vicryl.  The skin was then reapproximated well coapted with 3-0 nylon in a combination of simple and horizontal mattress type stitching.  An additional 10 cc of half percent Marcaine plain was injected about the operative site at the end of the case.  Surgical dressing was then applied consisting of Xeroform to the incision site followed by 4 x 4 gauze, Kling, Kerlix, web roll, Ace wrap.  The pneumatic ankle tourniquet was deflated and a prompt hyperemic response was noted all digits that were remaining of the right foot.  The patient tolerated the procedure and anesthesia well was transferred to the recovery room fossae and stable vascular status intact to all remaining toes the right foot.  The patient will be discharged home with the following written and oral postop instructions: Keep surgical dressings clean, dry, and intact, elevate the right lower extremity when at rest, remain nonweightbearing to partial weightbearing on the right foot with heel contact only in surgical [place] at all times, take postop pain, antibiotic, and antinausea medication as prescribed, return to clinic in 1 week for follow-up.  COMPLICATIONS: none  CONDITION: Good, stable  Caroline More

## 2018-12-06 ENCOUNTER — Encounter: Payer: Self-pay | Admitting: Podiatry

## 2018-12-06 MED FILL — Insulin Aspart Inj 100 Unit/ML: SUBCUTANEOUS | Qty: 5 | Status: AC

## 2018-12-07 LAB — SURGICAL PATHOLOGY

## 2018-12-08 ENCOUNTER — Other Ambulatory Visit: Payer: Self-pay | Admitting: Internal Medicine

## 2018-12-11 DIAGNOSIS — M79671 Pain in right foot: Secondary | ICD-10-CM | POA: Diagnosis not present

## 2018-12-11 LAB — AEROBIC/ANAEROBIC CULTURE W GRAM STAIN (SURGICAL/DEEP WOUND): Culture: NO GROWTH

## 2018-12-21 ENCOUNTER — Encounter: Payer: BC Managed Care – PPO | Admitting: Internal Medicine

## 2018-12-25 DIAGNOSIS — L97512 Non-pressure chronic ulcer of other part of right foot with fat layer exposed: Secondary | ICD-10-CM | POA: Diagnosis not present

## 2019-01-01 DIAGNOSIS — L97512 Non-pressure chronic ulcer of other part of right foot with fat layer exposed: Secondary | ICD-10-CM | POA: Diagnosis not present

## 2019-01-08 DIAGNOSIS — E08621 Diabetes mellitus due to underlying condition with foot ulcer: Secondary | ICD-10-CM | POA: Diagnosis not present

## 2019-01-08 DIAGNOSIS — L97512 Non-pressure chronic ulcer of other part of right foot with fat layer exposed: Secondary | ICD-10-CM | POA: Diagnosis not present

## 2019-01-08 DIAGNOSIS — Z89421 Acquired absence of other right toe(s): Secondary | ICD-10-CM | POA: Diagnosis not present

## 2019-01-15 DIAGNOSIS — L97512 Non-pressure chronic ulcer of other part of right foot with fat layer exposed: Secondary | ICD-10-CM | POA: Diagnosis not present

## 2019-01-16 ENCOUNTER — Encounter: Payer: Self-pay | Admitting: Internal Medicine

## 2019-01-16 ENCOUNTER — Ambulatory Visit: Payer: BC Managed Care – PPO | Admitting: Internal Medicine

## 2019-01-16 ENCOUNTER — Other Ambulatory Visit (INDEPENDENT_AMBULATORY_CARE_PROVIDER_SITE_OTHER): Payer: BC Managed Care – PPO

## 2019-01-16 ENCOUNTER — Other Ambulatory Visit: Payer: Self-pay | Admitting: Internal Medicine

## 2019-01-16 ENCOUNTER — Other Ambulatory Visit: Payer: Self-pay

## 2019-01-16 DIAGNOSIS — E876 Hypokalemia: Secondary | ICD-10-CM

## 2019-01-16 DIAGNOSIS — E11628 Type 2 diabetes mellitus with other skin complications: Secondary | ICD-10-CM

## 2019-01-16 LAB — POTASSIUM: Potassium: 4.5 mEq/L (ref 3.5–5.1)

## 2019-01-18 MED ORDER — LANCETS 30G MISC: 1.0000 | Freq: Two times a day (BID) | 1 refills | Status: DC

## 2019-01-18 MED ORDER — ONETOUCH ULTRA 2 W/DEVICE KIT: 1.0000 | PACK | Freq: Once | 0 refills | Status: AC

## 2019-01-18 MED ORDER — ONETOUCH ULTRA VI STRP: 1.0000 | ORAL_STRIP | Freq: Two times a day (BID) | 1 refills | Status: DC

## 2019-02-08 ENCOUNTER — Other Ambulatory Visit: Payer: Self-pay | Admitting: Internal Medicine

## 2019-02-14 ENCOUNTER — Other Ambulatory Visit: Payer: Self-pay | Admitting: Internal Medicine

## 2019-02-14 DIAGNOSIS — E1159 Type 2 diabetes mellitus with other circulatory complications: Secondary | ICD-10-CM

## 2019-02-16 DIAGNOSIS — Z09 Encounter for follow-up examination after completed treatment for conditions other than malignant neoplasm: Secondary | ICD-10-CM | POA: Diagnosis not present

## 2019-02-16 DIAGNOSIS — S90821A Blister (nonthermal), right foot, initial encounter: Secondary | ICD-10-CM | POA: Diagnosis not present

## 2019-03-05 DIAGNOSIS — L97512 Non-pressure chronic ulcer of other part of right foot with fat layer exposed: Secondary | ICD-10-CM | POA: Diagnosis not present

## 2019-03-10 ENCOUNTER — Other Ambulatory Visit: Payer: Self-pay | Admitting: Internal Medicine

## 2019-03-21 DIAGNOSIS — E1142 Type 2 diabetes mellitus with diabetic polyneuropathy: Secondary | ICD-10-CM | POA: Diagnosis not present

## 2019-03-26 ENCOUNTER — Ambulatory Visit: Payer: BC Managed Care – PPO | Attending: Internal Medicine

## 2019-03-26 ENCOUNTER — Other Ambulatory Visit: Payer: Self-pay

## 2019-03-26 DIAGNOSIS — Z20822 Contact with and (suspected) exposure to covid-19: Secondary | ICD-10-CM

## 2019-03-26 DIAGNOSIS — Z20828 Contact with and (suspected) exposure to other viral communicable diseases: Secondary | ICD-10-CM | POA: Diagnosis not present

## 2019-03-28 LAB — NOVEL CORONAVIRUS, NAA: SARS-CoV-2, NAA: NOT DETECTED

## 2019-03-29 ENCOUNTER — Ambulatory Visit: Payer: BC Managed Care – PPO | Attending: Internal Medicine

## 2019-03-29 DIAGNOSIS — Z20822 Contact with and (suspected) exposure to covid-19: Secondary | ICD-10-CM

## 2019-04-02 ENCOUNTER — Other Ambulatory Visit: Payer: BC Managed Care – PPO

## 2019-04-03 ENCOUNTER — Telehealth: Payer: Self-pay

## 2019-04-03 NOTE — Telephone Encounter (Signed)
Pt stated that she has recieved numerous call and was not sure what it was about. Informed pt that she was tested at [facility] and the samples were not picked up by the courier and advised pt that she will need to retest. Pt stated she will continue qurantine for 14 days since her exposure just to be safe. Pt stated that she will not retest at this time.

## 2019-04-04 LAB — NOVEL CORONAVIRUS, NAA

## 2019-04-09 ENCOUNTER — Ambulatory Visit
Admission: RE | Admit: 2019-04-09 | Discharge: 2019-04-09 | Disposition: A | Discharge status: Home / Self Care | Payer: BC Managed Care – PPO | Source: Ambulatory Visit | Attending: Internal Medicine | Admitting: Internal Medicine

## 2019-04-09 DIAGNOSIS — R928 Other abnormal and inconclusive findings on diagnostic imaging of breast: Secondary | ICD-10-CM | POA: Insufficient documentation

## 2019-04-09 DIAGNOSIS — N6489 Other specified disorders of breast: Secondary | ICD-10-CM | POA: Insufficient documentation

## 2019-04-13 DIAGNOSIS — E1142 Type 2 diabetes mellitus with diabetic polyneuropathy: Secondary | ICD-10-CM | POA: Diagnosis not present

## 2019-04-13 DIAGNOSIS — I872 Venous insufficiency (chronic) (peripheral): Secondary | ICD-10-CM | POA: Diagnosis not present

## 2019-04-13 DIAGNOSIS — L603 Nail dystrophy: Secondary | ICD-10-CM | POA: Diagnosis not present

## 2019-04-13 DIAGNOSIS — Z89421 Acquired absence of other right toe(s): Secondary | ICD-10-CM | POA: Diagnosis not present

## 2019-04-25 DIAGNOSIS — E113393 Type 2 diabetes mellitus with moderate nonproliferative diabetic retinopathy without macular edema, bilateral: Secondary | ICD-10-CM | POA: Diagnosis not present

## 2019-05-13 ENCOUNTER — Other Ambulatory Visit: Payer: Self-pay | Admitting: Internal Medicine

## 2019-05-14 ENCOUNTER — Encounter: Payer: Self-pay | Admitting: Internal Medicine

## 2019-05-14 NOTE — Telephone Encounter (Signed)
Looks like the HCTZ was d/c at discharge... please advise

## 2019-05-14 NOTE — Telephone Encounter (Signed)
msg sent via mychart   From  Lurlean Nanny, CMA To  [place] and Delivered  05/14/2019 4:32 PM  Last Read in MyChart  Not Read  Good afternoon,  I received a refill request for hydrochlorothiazide. After reviewing you chart, it looks like it was stopped at discharge from hospital last year. Have you still been taking this medicine?

## 2019-05-14 NOTE — Telephone Encounter (Signed)
Can you call and ask her if she has continued to take this?

## 2019-06-05 ENCOUNTER — Other Ambulatory Visit: Payer: Self-pay | Admitting: Internal Medicine

## 2019-06-05 DIAGNOSIS — E1159 Type 2 diabetes mellitus with other circulatory complications: Secondary | ICD-10-CM

## 2019-06-19 ENCOUNTER — Encounter: Payer: BC Managed Care – PPO | Admitting: Internal Medicine

## 2019-07-11 ENCOUNTER — Other Ambulatory Visit: Payer: Self-pay | Admitting: Internal Medicine

## 2019-07-11 DIAGNOSIS — E11628 Type 2 diabetes mellitus with other skin complications: Secondary | ICD-10-CM

## 2019-07-16 DIAGNOSIS — E1142 Type 2 diabetes mellitus with diabetic polyneuropathy: Secondary | ICD-10-CM | POA: Diagnosis not present

## 2019-07-16 DIAGNOSIS — B351 Tinea unguium: Secondary | ICD-10-CM | POA: Diagnosis not present

## 2019-07-24 ENCOUNTER — Ambulatory Visit (INDEPENDENT_AMBULATORY_CARE_PROVIDER_SITE_OTHER): Payer: BC Managed Care – PPO | Admitting: Internal Medicine

## 2019-07-24 ENCOUNTER — Encounter: Payer: Self-pay | Admitting: Internal Medicine

## 2019-07-24 ENCOUNTER — Other Ambulatory Visit: Payer: Self-pay

## 2019-07-24 VITALS — BP 138/92 | HR 103 | Temp 98.0°F | Ht 66.0 in | Wt 245.0 lb

## 2019-07-24 DIAGNOSIS — I10 Essential (primary) hypertension: Secondary | ICD-10-CM

## 2019-07-24 DIAGNOSIS — S81811A Laceration without foreign body, right lower leg, initial encounter: Secondary | ICD-10-CM

## 2019-07-24 DIAGNOSIS — E1159 Type 2 diabetes mellitus with other circulatory complications: Secondary | ICD-10-CM

## 2019-07-24 DIAGNOSIS — E782 Mixed hyperlipidemia: Secondary | ICD-10-CM | POA: Diagnosis not present

## 2019-07-24 DIAGNOSIS — L659 Nonscarring hair loss, unspecified: Secondary | ICD-10-CM | POA: Diagnosis not present

## 2019-07-24 DIAGNOSIS — Z780 Asymptomatic menopausal state: Secondary | ICD-10-CM

## 2019-07-24 DIAGNOSIS — Z0001 Encounter for general adult medical examination with abnormal findings: Secondary | ICD-10-CM

## 2019-07-24 LAB — COMPREHENSIVE METABOLIC PANEL
ALT: 26 U/L (ref 0–35)
AST: 20 U/L (ref 0–37)
Albumin: 4.1 g/dL (ref 3.5–5.2)
Alkaline Phosphatase: 158 U/L — ABNORMAL HIGH (ref 39–117)
BUN: 26 mg/dL — ABNORMAL HIGH (ref 6–23)
CO2: 33 mEq/L — ABNORMAL HIGH (ref 19–32)
Calcium: 9.3 mg/dL (ref 8.4–10.5)
Chloride: 96 mEq/L (ref 96–112)
Creatinine, Ser: 0.92 mg/dL (ref 0.40–1.20)
GFR: 62.52 mL/min (ref >60.00)
Glucose, Bld: 192 mg/dL — ABNORMAL HIGH (ref 70–99)
Potassium: 5.1 mEq/L (ref 3.5–5.1)
Sodium: 134 mEq/L — ABNORMAL LOW (ref 135–145)
Total Bilirubin: 0.7 mg/dL (ref 0.2–1.2)
Total Protein: 6.8 g/dL (ref 6.0–8.3)

## 2019-07-24 LAB — CBC
HCT: 40.5 % (ref 36.0–46.0)
Hemoglobin: 13.4 g/dL (ref 12.0–15.0)
MCHC: 33.2 g/dL (ref 30.0–36.0)
MCV: 87.9 fl (ref 78.0–100.0)
Platelets: 143 10*3/uL — ABNORMAL LOW (ref 150.0–400.0)
RBC: 4.61 Mil/uL (ref 3.87–5.11)
RDW: 14.5 % (ref 11.5–15.5)
WBC: 9.5 10*3/uL (ref 4.0–10.5)

## 2019-07-24 LAB — LIPID PANEL
Cholesterol: 192 mg/dL (ref 0–200)
HDL: 64.6 mg/dL (ref >39.00)
LDL Cholesterol: 111 mg/dL — ABNORMAL HIGH (ref 0–99)
NonHDL: 127.56
Total CHOL/HDL Ratio: 3
Triglycerides: 84 mg/dL (ref 0.0–149.0)
VLDL: 16.8 mg/dL (ref 0.0–40.0)

## 2019-07-24 LAB — VITAMIN B12: Vitamin B-12: 239 pg/mL (ref 211–911)

## 2019-07-24 LAB — TSH: TSH: 1.95 u[IU]/mL (ref 0.35–4.50)

## 2019-07-24 LAB — HEMOGLOBIN A1C: Hgb A1c MFr Bld: 7.4 % — ABNORMAL HIGH (ref 4.6–6.5)

## 2019-07-24 LAB — VITAMIN D 25 HYDROXY (VIT D DEFICIENCY, FRACTURES): VITD: 14.63 ng/mL — ABNORMAL LOW (ref 30.00–100.00)

## 2019-07-24 MED ORDER — FUROSEMIDE 20 MG PO TABS: 20.0000 mg | ORAL_TABLET | Freq: Every day | ORAL | 1 refills | Status: DC

## 2019-07-24 NOTE — Assessment & Plan Note (Signed)
A1C today No microalbumin secondary to ARB therapy Encouraged her to consume a low carb diet and exercise for weight loss Encouraged routine foot and eye exams Continue Glipizide Immunizations UTD

## 2019-07-24 NOTE — Assessment & Plan Note (Signed)
CMET and lipid profile today Encouraged her to consume a low fat diet Continue Atorvastatin 

## 2019-07-24 NOTE — Patient Instructions (Signed)

## 2019-07-24 NOTE — Assessment & Plan Note (Signed)
Reasonably well controlled today Continue Amlodipine and Losartan Change HCTZ to Lasix 20 mg daily given edema Encouraged DASH diet and exercise for weight loss

## 2019-07-24 NOTE — Progress Notes (Signed)
Subjective:    Patient ID: Rebecca Park, female    DOB: 12/10/1960, 59 y.o.   MRN: [id number]  HPI  Patient presents clinic today for her annual exam.  She is also due to conditions.  White coat HTN: Her BP today is 138/92.  She is taking Amlodipine, Losartan and HCTZ as prescribed.  Her BP usually runs at home 120/78.  ECG from 10/2018 reviewed.  HLD: Her last LDL was 110, triglycerides 195, 08/2018.  She denies myalgias on Atorvastatin.  She tries to consume a low-fat diet.  DM2: Her last A1c was 6.7%, 10/2018.  Her fasting sugars range 96-210.  She is taking Glipizide as prescribed.  She checks her feet routinely.  Her last eye exam was within the last year.  Flu: 02/2019 Tetanus: 05/2018 Pneumovax: 02/2018  Covid: 05/2019, 06/2019 Pap smear: 2009 Mammogram: 03/2019 Bone density: never Colon screening: 08/2018 Vision screening: annually Dentist: biannually  Diet: She does eat meat. She consumes fruits and veggies daily. She tries to avoid fried foods. She drinks mostly water. Exercise: She bought a stationary bike 3 weeks ago  Review of Systems    Past Medical History:  Diagnosis Date  . Diabetes mellitus without complication ([place])   . Hypertension   . PONV (postoperative nausea and vomiting)    the day after rt 5th toe amputation surgery    Current Outpatient Medications  Medication Sig Dispense Refill  . amLODipine (NORVASC) 5 MG tablet TAKE 1 TABLET BY MOUTH EVERY DAY 90 tablet 0  . atorvastatin (LIPITOR) 20 MG tablet Take 1 tablet (20 mg total) by mouth daily. 90 tablet 3  . Ensure Max Protein (ENSURE MAX PROTEIN) LIQD Take 330 mLs (11 oz total) by mouth 2 (two) times daily between meals. 330 mL 0  . glipiZIDE (GLUCOTROL) 10 MG tablet TAKE 1 TABLET (10 MG TOTAL) BY MOUTH DAILY BEFORE BREAKFAST. 90 tablet 0  . hydrochlorothiazide (HYDRODIURIL) 25 MG tablet TAKE 1 TABLET BY MOUTH EVERY DAY 90 tablet 0  . Lancets 30G MISC 1 each by Does not apply route 2 (two) times  daily. 200 each 1  . losartan (COZAAR) 50 MG tablet TAKE 2 TABLETS BY MOUTH EVERY DAY 180 tablet 0  . Multiple Vitamins-Minerals (WOMENS MULTIVITAMIN PO) Take 1 tablet by mouth daily.     Glory Rosebush ULTRA test strip 1 EACH BY OTHER ROUTE 2 (TWO) TIMES DAILY. 200 strip 1  . Vitamin D, Ergocalciferol, (DRISDOL) 1.25 MG (50000 UT) CAPS capsule Take 1 capsule (50,000 Units total) by mouth every 7 (seven) days. 12 capsule 0   No current facility-administered medications for this visit.    No Known Allergies  Family History  Problem Relation Age of Onset  . Heart disease Father   . Heart attack Father   . Heart disease Maternal Grandfather     Social History   Socioeconomic History  . Marital status: Married    Spouse name: Not on file  . Number of children: Not on file  . Years of education: Not on file  . Highest education level: Not on file  Occupational History  . Not on file  Tobacco Use  . Smoking status: Never Smoker  . Smokeless tobacco: Never Used  Substance and Sexual Activity  . Alcohol use: Not Currently    Comment: recently quit 01/2018, 3-4 beers QD  . Drug use: Never  . Sexual activity: Not on file  Other Topics Concern  . Not on file  Social History  Narrative  . Not on file   Social Determinants of Health   Financial Resource Strain:   . Difficulty of Paying Living Expenses:   Food Insecurity:   . Worried About Charity fundraiser in the Last Year:   . Arboriculturist in the Last Year:   Transportation Needs:   . Film/video editor (Medical):   Marland Kitchen Lack of Transportation (Non-Medical):   Physical Activity:   . Days of Exercise per Week:   . Minutes of Exercise per Session:   Stress:   . Feeling of Stress :   Social Connections:   . Frequency of Communication with Friends and Family:   . Frequency of Social Gatherings with Friends and Family:   . Attends Religious Services:   . Active Member of Clubs or Organizations:   . Attends Theatre manager Meetings:   Marland Kitchen Marital Status:   Intimate Partner Violence:   . Fear of Current or Ex-Partner:   . Emotionally Abused:   Marland Kitchen Physically Abused:   . Sexually Abused:      Constitutional: Pt reports weight gain. Denies fever, malaise, fatigue, headache.  HEENT: Denies eye pain, eye redness, ear pain, ringing in the ears, wax buildup, runny nose, nasal congestion, bloody nose, or sore throat. Respiratory: Denies difficulty breathing, shortness of breath, cough or sputum production.   Cardiovascular: Pt reports swelling legs. Denies chest pain, chest tightness, palpitations or swelling in the hands.  Gastrointestinal: Denies abdominal pain, bloating, constipation, diarrhea or blood in the stool.  GU: Denies urgency, frequency, pain with urination, burning sensation, blood in urine, odor or discharge. Musculoskeletal: Denies decrease in range of motion, difficulty with gait, muscle pain or joint pain and swelling.  Skin: Pt reports skin tear to RLE. Denies redness, rashes, lesions or ulcercations.  Neurological: Denies dizziness, difficulty with memory, difficulty with speech or problems with balance and coordination.  Psych: Denies anxiety, depression, SI/HI.  No other specific complaints in a complete review of systems (except as listed in HPI above).     Objective:   Physical Exam   BP (!) 138/92   Pulse (!) 103   Temp 98 F (36.7 C) (Temporal)   Ht '5\' 6"'  (1.676 m)   Wt 245 lb (111.1 kg)   SpO2 98%   BMI 39.54 kg/m   Wt Readings from Last 3 Encounters:  12/05/18 210 lb (95.3 kg)  11/21/18 215 lb (97.5 kg)  11/09/18 213 lb 4.8 oz (96.8 kg)    General: Appears her stated age, obese,  in NAD. Skin: Warm, dry and intact. Nn infected skin tear of right medial calf. HEENT: Head: normal shape and size, thinning hair noted; Eyes: sclera white, no icterus, conjunctiva pink, PERRLA and EOMs intact;  Neck:  Neck supple, trachea midline. No masses, lumps or thyromegaly  present.  Cardiovascular: Normal rate and rhythm. S1,S2 noted.  No murmur, rubs or gallops noted. 1+ pitting BLE edema. No carotid bruits noted. Pulmonary/Chest: Normal effort and positive vesicular breath sounds. No respiratory distress. No wheezes, rales or ronchi noted.  Abdomen: Soft and nontender. Normal bowel sounds. No distention or masses noted. Liver, spleen and kidneys non palpable. Musculoskeletal: Strength 5/5 BUE/BLE. No difficulty with gait.  Neurological: Alert and oriented. Cranial nerves II-XII grossly intact. Coordination normal.  Psychiatric: Mood and affect normal. Behavior is normal. Judgment and thought content normal.    BMET    Component Value Date/Time   NA 137 11/11/2018 0356   K 4.5  01/16/2019 0827   CL 104 11/11/2018 0356   CO2 26 11/11/2018 0356   GLUCOSE 203 (H) 11/11/2018 0356   BUN 17 11/11/2018 0356   CREATININE 0.83 11/11/2018 0356   CREATININE 0.93 09/15/2018 1545   CALCIUM 8.6 (L) 11/11/2018 0356   GFRNONAA >60 11/11/2018 0356   GFRAA >60 11/11/2018 0356    Lipid Panel     Component Value Date/Time   CHOL 201 (H) 09/15/2018 1545   TRIG 195 (H) 09/15/2018 1545   HDL 60 09/15/2018 1545   CHOLHDL 3.4 09/15/2018 1545   VLDL 19.6 06/06/2018 0912   LDLCALC 110 (H) 09/15/2018 1545    CBC    Component Value Date/Time   WBC 13.0 (H) 11/11/2018 0356   RBC 3.35 (L) 11/11/2018 0356   HGB 9.9 (L) 11/11/2018 0356   HCT 30.2 (L) 11/11/2018 0356   PLT 186 11/11/2018 0356   MCV 90.1 11/11/2018 0356   MCH 29.6 11/11/2018 0356   MCHC 32.8 11/11/2018 0356   RDW 12.5 11/11/2018 0356    Hgb A1C Lab Results  Component Value Date   HGBA1C 6.7 (H) 11/09/2018          Assessment & Plan:   Preventative Health Maintenance:  Encouraged her to get a flu shot in the fall Tetanus, Pneumovax and Covid UTD She declines pap smear Mammogram UTD Bone density due-ordered, she will schedule with her 6 month call back for her mammogram Colon screening  UTD Encouraged her to consume a balanced diet and exercise regimen Advised her to see an eye doctor and dentist annually We will check CBC, C met, lipid, A1c and vitamin D today  Thinning Hair:  Will check TSH, Vit D and B12 today  Noninfected Skin Tear to Right Leg:  Keep covered Monitor for increased redness, swelling or discharge  RTC in 6 months, follow-up chronic conditions Webb Silversmith, NP This visit occurred during the SARS-CoV-2 public health emergency.  Safety protocols were in place, including screening questions prior to the visit, additional usage of staff PPE, and extensive cleaning of exam room while observing appropriate contact time as indicated for disinfecting solutions.

## 2019-07-26 ENCOUNTER — Encounter: Payer: Self-pay | Admitting: Internal Medicine

## 2019-07-26 DIAGNOSIS — E1159 Type 2 diabetes mellitus with other circulatory complications: Secondary | ICD-10-CM

## 2019-07-27 MED ORDER — ATORVASTATIN CALCIUM 40 MG PO TABS: 40.0000 mg | ORAL_TABLET | Freq: Every day | ORAL | 0 refills | Status: DC

## 2019-07-27 MED ORDER — GLIPIZIDE 10 MG PO TABS: 10.0000 mg | ORAL_TABLET | Freq: Two times a day (BID) | ORAL | 0 refills | Status: DC

## 2019-07-27 MED ORDER — VITAMIN D (ERGOCALCIFEROL) 1.25 MG (50000 UNIT) PO CAPS: 50000.0000 [IU] | ORAL_CAPSULE | ORAL | 0 refills | Status: DC

## 2019-08-01 ENCOUNTER — Other Ambulatory Visit: Payer: Self-pay

## 2019-08-01 DIAGNOSIS — Z86010 Personal history of colonic polyps: Secondary | ICD-10-CM

## 2019-08-02 ENCOUNTER — Telehealth (INDEPENDENT_AMBULATORY_CARE_PROVIDER_SITE_OTHER): Payer: Self-pay | Admitting: Gastroenterology

## 2019-08-02 DIAGNOSIS — Z86010 Personal history of colonic polyps: Secondary | ICD-10-CM

## 2019-08-02 NOTE — Progress Notes (Signed)
Gastroenterology Pre-Procedure Review  Request Date: 09/17/19 Requesting Physician: Dr. Marius Ditch  PATIENT REVIEW QUESTIONS: The patient responded to the following health history questions as indicated:    1. Are you having any GI issues? no 2. Do you have a personal history of Polyps? yes (June 2020 with Dr. Marius Ditch) 3. Do you have a family history of Colon Cancer or Polyps? no 4. Diabetes Mellitus? two toes amputated due to an injury 5. Joint replacements in the past 12 months?no 6. Major health problems in the past 3 months?no 7. Any artificial heart valves, MVP, or defibrillator?no    MEDICATIONS & ALLERGIES:    Patient reports the following regarding taking any anticoagulation/antiplatelet therapy:   Plavix, Coumadin, Eliquis, Xarelto, Lovenox, Pradaxa, Brilinta, or Effient? no Aspirin? no  Patient confirms/reports the following medications:  Current Outpatient Medications  Medication Sig Dispense Refill  . amLODipine (NORVASC) 5 MG tablet TAKE 1 TABLET BY MOUTH EVERY DAY 90 tablet 0  . atorvastatin (LIPITOR) 40 MG tablet Take 1 tablet (40 mg total) by mouth daily. 90 tablet 0  . furosemide (LASIX) 20 MG tablet Take 1 tablet (20 mg total) by mouth daily. 90 tablet 1  . glipiZIDE (GLUCOTROL) 10 MG tablet Take 1 tablet (10 mg total) by mouth 2 (two) times daily before a meal. 90 tablet 0  . Lancets 30G MISC 1 each by Does not apply route 2 (two) times daily. 200 each 1  . losartan (COZAAR) 50 MG tablet TAKE 2 TABLETS BY MOUTH EVERY DAY 180 tablet 0  . Multiple Vitamins-Minerals (WOMENS MULTIVITAMIN PO) Take 1 tablet by mouth daily.     Glory Rosebush ULTRA test strip 1 EACH BY OTHER ROUTE 2 (TWO) TIMES DAILY. 200 strip 1  . Vitamin D, Ergocalciferol, (DRISDOL) 1.25 MG (50000 UNIT) CAPS capsule Take 1 capsule (50,000 Units total) by mouth every 7 (seven) days. 12 capsule 0  . Ensure Max Protein (ENSURE MAX PROTEIN) LIQD Take 330 mLs (11 oz total) by mouth 2 (two) times daily between meals.  (Patient not taking: Reported on 08/02/2019) 330 mL 0   No current facility-administered medications for this visit.    Patient confirms/reports the following allergies:  No Known Allergies  No orders of the defined types were placed in this encounter.   AUTHORIZATION INFORMATION Primary Insurance: 1D#: Group #:  Secondary Insurance: 1D#: Group #:  SCHEDULE INFORMATION: Date: Monday 09/17/19 Time: Location:[facility]

## 2019-08-04 ENCOUNTER — Emergency Department
Admission: EM | Admit: 2019-08-04 | Discharge: 2019-08-04 | Disposition: A | Discharge status: Home / Self Care | Payer: BC Managed Care – PPO | Attending: Emergency Medicine | Admitting: Emergency Medicine

## 2019-08-04 ENCOUNTER — Other Ambulatory Visit: Payer: Self-pay

## 2019-08-04 DIAGNOSIS — Y929 Unspecified place or not applicable: Secondary | ICD-10-CM | POA: Diagnosis not present

## 2019-08-04 DIAGNOSIS — S81811A Laceration without foreign body, right lower leg, initial encounter: Secondary | ICD-10-CM | POA: Diagnosis not present

## 2019-08-04 DIAGNOSIS — Y999 Unspecified external cause status: Secondary | ICD-10-CM | POA: Insufficient documentation

## 2019-08-04 DIAGNOSIS — Y939 Activity, unspecified: Secondary | ICD-10-CM | POA: Diagnosis not present

## 2019-08-04 DIAGNOSIS — W268XXA Contact with other sharp object(s), not elsewhere classified, initial encounter: Secondary | ICD-10-CM | POA: Diagnosis not present

## 2019-08-04 DIAGNOSIS — Z7984 Long term (current) use of oral hypoglycemic drugs: Secondary | ICD-10-CM | POA: Insufficient documentation

## 2019-08-04 DIAGNOSIS — I10 Essential (primary) hypertension: Secondary | ICD-10-CM | POA: Diagnosis not present

## 2019-08-04 DIAGNOSIS — E119 Type 2 diabetes mellitus without complications: Secondary | ICD-10-CM | POA: Diagnosis not present

## 2019-08-04 DIAGNOSIS — Z79899 Other long term (current) drug therapy: Secondary | ICD-10-CM | POA: Diagnosis not present

## 2019-08-04 MED ORDER — LIDOCAINE HCL (PF) 1 % IJ SOLN
5.0000 mL | Freq: Once | INTRAMUSCULAR | Status: AC
  Administered 2019-08-04: 5 mL
  Filled 2019-08-04: qty 5

## 2019-08-04 NOTE — Discharge Instructions (Signed)
Follow discharge care instructions.  Advised 600 mg ibuprofen as needed for pain.  Have sutures removed in 10 days.

## 2019-08-04 NOTE — ED Provider Notes (Signed)
[facility] Emergency Department Provider Note   ____________________________________________   First MD Initiated Contact with Patient 08/04/19 1645     (approximate)  I have reviewed the triage vital signs and the nursing notes.   HISTORY  Chief Complaint Laceration    HPI Rebecca Park is a 59 y.o. female patient presents with laceration to her right lower leg.  Incident occurred prior to arrival.  Bleeding controlled with direct pressure.  Patient denies loss sensation or loss of function.  Patient tetanus shot is up-to-date.  Patient was cut by wooden cabinet.  Patient rates her pain as a 4/10.  Patient described the pain as "sore".  Pressure dressing applied in triage.         Past Medical History:  Diagnosis Date  . Diabetes mellitus without complication ([place])   . Hypertension   . PONV (postoperative nausea and vomiting)    the day after rt 5th toe amputation surgery    Patient Active Problem List   Diagnosis Date Noted  . HLD (hyperlipidemia) 09/17/2018  . Diabetes mellitus ([place]) 03/27/2018  . White coat syndrome with diagnosis of hypertension 03/27/2018    Past Surgical History:  Procedure Laterality Date  . AMPUTATION Right 11/10/2018   Procedure: AMPUTATION RAY (amputation 5th metatarsal Right foot;  Surgeon: Caroline More, DPM;  Location: [facility] ORS;  Service: Podiatry;  Laterality: Right;  . AMPUTATION TOE Right 12/05/2018   Procedure: RAY RIGHT 4TH AMPUTATION;  Surgeon: Caroline More, DPM;  Location: [place];  Service: Podiatry;  Laterality: Right;  mac with local DIABETIC-oral meds  . BONE BIOPSY Right 11/10/2018   Procedure: BONE BIOPSY- Right 4th toe;  Surgeon: Caroline More, DPM;  Location: [facility] ORS;  Service: Podiatry;  Laterality: Right;  . BUNIONECTOMY Bilateral   . COLONOSCOPY WITH PROPOFOL N/A 09/07/2018   Procedure: COLONOSCOPY WITH PROPOFOL;  Surgeon: Lin Landsman, MD;  Location: [facility] ENDOSCOPY;   Service: Gastroenterology;  Laterality: N/A;    Prior to Admission medications   Medication Sig Start Date End Date Taking? Authorizing Provider  amLODipine (NORVASC) 5 MG tablet TAKE 1 TABLET BY MOUTH EVERY DAY 06/05/19   Jearld Fenton, NP  atorvastatin (LIPITOR) 40 MG tablet Take 1 tablet (40 mg total) by mouth daily. 07/27/19   Jearld Fenton, NP  Ensure Max Protein (ENSURE MAX PROTEIN) LIQD Take 330 mLs (11 oz total) by mouth 2 (two) times daily between meals. Patient not taking: Reported on 08/02/2019 11/12/18   Loletha Grayer, MD  furosemide (LASIX) 20 MG tablet Take 1 tablet (20 mg total) by mouth daily. 07/24/19   Jearld Fenton, NP  glipiZIDE (GLUCOTROL) 10 MG tablet Take 1 tablet (10 mg total) by mouth 2 (two) times daily before a meal. 07/27/19   Baity, Coralie Keens, NP  Lancets 30G MISC 1 each by Does not apply route 2 (two) times daily. 01/18/19   Jearld Fenton, NP  losartan (COZAAR) 50 MG tablet TAKE 2 TABLETS BY MOUTH EVERY DAY 05/15/19   Jearld Fenton, NP  Multiple Vitamins-Minerals (WOMENS MULTIVITAMIN PO) Take 1 tablet by mouth daily.     [provider]  ONETOUCH ULTRA test strip 1 EACH BY OTHER ROUTE 2 (TWO) TIMES DAILY. 07/11/19   Jearld Fenton, NP  Vitamin D, Ergocalciferol, (DRISDOL) 1.25 MG (50000 UNIT) CAPS capsule Take 1 capsule (50,000 Units total) by mouth every 7 (seven) days. 07/27/19   Jearld Fenton, NP    Allergies Patient has  no known allergies.  Family History  Problem Relation Age of Onset  . Heart disease Father   . Heart attack Father   . Heart disease Maternal Grandfather     Social History Social History   Tobacco Use  . Smoking status: Never Smoker  . Smokeless tobacco: Never Used  Substance Use Topics  . Alcohol use: Not Currently    Comment: recently quit 01/2018, 3-4 beers QD  . Drug use: Never    Review of Systems Constitutional: No fever/chills Eyes: No visual changes. ENT: No sore throat. Cardiovascular: Denies chest  pain. Respiratory: Denies shortness of breath. Gastrointestinal: No abdominal pain.  No nausea, no vomiting.  No diarrhea.  No constipation. Genitourinary: Negative for dysuria. Musculoskeletal: Negative for back pain. Skin: Negative for rash.  Right lower leg laceration Neurological: Negative for headaches, focal weakness or numbness. Endocrine:  Diabetes, hyperlipidemia, and hypertension.   ____________________________________________   PHYSICAL EXAM:  VITAL SIGNS: ED Triage Vitals  Enc Vitals Group     BP 08/04/19 1529 (!) 189/90     Pulse Rate 08/04/19 1529 (!) 103     Resp 08/04/19 1529 18     Temp 08/04/19 1529 98 F (36.7 C)     Temp Source 08/04/19 1529 Oral     SpO2 08/04/19 1529 98 %     Weight 08/04/19 1530 240 lb (108.9 kg)     Height 08/04/19 1530 _0  (1.702 m)     Head Circumference --      Peak Flow --      Pain Score 08/04/19 1530 4     Pain Loc --      Pain Edu? --      Excl. in [place]? --     Constitutional: Alert and oriented. Well appearing and in no acute distress. Cardiovascular: Normal rate, regular rhythm. Grossly normal heart sounds.  Good peripheral circulation.  Elevated blood pressure. Respiratory: Normal respiratory effort.  No retractions. Lungs CTAB. Musculoskeletal: No lower extremity tenderness nor edema.  No joint effusions. Neurologic:  Normal speech and language. No gross focal neurologic deficits are appreciated. No gait instability. Skin: 2 cm laceration right lower leg. Psychiatric: Mood and affect are normal. Speech and behavior are normal.  ____________________________________________   LABS (all labs ordered are listed, but only abnormal results are displayed)  Labs Reviewed - No data to display ____________________________________________  EKG   ____________________________________________  RADIOLOGY  ED MD interpretation:    Official radiology report(s): No results  found.  ____________________________________________   PROCEDURES  Procedure(s) performed (including Critical Care):  Marland KitchenMarland KitchenLaceration Repair  Date/Time: 08/04/2019 5:30 PM Performed by: Sable Feil, PA-C Authorized by: Sable Feil, PA-C   Consent:    Consent obtained:  Verbal   Consent given by:  Patient   Risks discussed:  Infection, pain, poor cosmetic result and need for additional repair Anesthesia (see MAR for exact dosages):    Anesthesia method:  Local infiltration   Local anesthetic:  Lidocaine 1% w/o epi Laceration details:    Location:  Leg   Leg location:  R lower leg   Length (cm):  2   Depth (mm):  2 Repair type:    Repair type:  Simple Pre-procedure details:    Preparation:  Patient was prepped and draped in usual sterile fashion Exploration:    Hemostasis achieved with:  Direct pressure   Contaminated: no   Treatment:    Area cleansed with:  Betadine and saline   Amount of cleaning:  Standard Skin repair:    Repair method:  Sutures   Suture size:  3-0   Suture material:  Nylon   Suture technique:  Simple interrupted   Number of sutures:  7 Approximation:    Approximation:  Close Post-procedure details:    Dressing:  Non-adherent dressing and sterile dressing   Patient tolerance of procedure:  Tolerated well, no immediate complications     ____________________________________________   INITIAL IMPRESSION / ASSESSMENT AND PLAN / ED COURSE  As part of my medical decision making, I reviewed the following data within the [place]     Patient presents with laceration to right lower leg.  See procedure note for wound closure.  Patient given discharge care instructions.  Patient advised to have suture removal in 10 days.    NYKOLE MATOS was evaluated in Emergency Department on 08/04/2019 for the symptoms described in the history of present illness. She was evaluated in the context of the global COVID-19 pandemic, which  necessitated consideration that the patient might be at risk for infection with the SARS-CoV-2 virus that causes COVID-19. Institutional protocols and algorithms that pertain to the evaluation of patients at risk for COVID-19 are in a state of rapid change based on information released by regulatory bodies including the CDC and federal and state organizations. These policies and algorithms were followed during the patient's care in the ED.       ____________________________________________   FINAL CLINICAL IMPRESSION(S) / ED DIAGNOSES  Final diagnoses:  Laceration of right lower extremity, initial encounter     ED Discharge Orders    None       Note:  This document was prepared using Dragon voice recognition software and may include unintentional dictation errors.    Sable Feil, PA-C 08/04/19 1734    Harvest Dark, MD 08/04/19 2028

## 2019-08-04 NOTE — ED Triage Notes (Addendum)
Pt arrives via POV from home after dropping a piece of wood onto right ankle. Pt has laceration wrapped in gauze, bleeding is controlled at this time. Pt reports it was a "triangle shaped cut". Pt reports she got her tetanus shot last year. PT in NAD at this time. Ambulatory from triage. Pt is diabetic.

## 2019-08-06 ENCOUNTER — Encounter: Payer: Self-pay | Admitting: Internal Medicine

## 2019-08-10 ENCOUNTER — Encounter: Payer: Self-pay | Admitting: Internal Medicine

## 2019-08-10 ENCOUNTER — Other Ambulatory Visit: Payer: Self-pay | Admitting: Internal Medicine

## 2019-08-10 DIAGNOSIS — N6489 Other specified disorders of breast: Secondary | ICD-10-CM

## 2019-08-11 ENCOUNTER — Other Ambulatory Visit: Payer: Self-pay | Admitting: Internal Medicine

## 2019-08-14 ENCOUNTER — Encounter: Payer: Self-pay | Admitting: Internal Medicine

## 2019-08-14 ENCOUNTER — Ambulatory Visit: Payer: BC Managed Care – PPO | Admitting: Internal Medicine

## 2019-08-14 ENCOUNTER — Other Ambulatory Visit: Payer: Self-pay

## 2019-08-14 VITALS — BP 138/90 | HR 94 | Temp 97.7°F | Wt 245.0 lb

## 2019-08-14 DIAGNOSIS — S81811A Laceration without foreign body, right lower leg, initial encounter: Secondary | ICD-10-CM

## 2019-08-14 DIAGNOSIS — Z4802 Encounter for removal of sutures: Secondary | ICD-10-CM | POA: Diagnosis not present

## 2019-08-14 MED ORDER — CEPHALEXIN 500 MG PO CAPS: 500.0000 mg | ORAL_CAPSULE | Freq: Three times a day (TID) | ORAL | 0 refills | Status: DC

## 2019-08-14 NOTE — Patient Instructions (Signed)
Suture Removal, Care After This sheet gives you information about how to care for yourself after your procedure. Your health care provider may also give you more specific instructions. If you have problems or questions, contact your health care provider. What can I expect after the procedure? After your stitches (sutures) are removed, it is common to have:  Some discomfort and swelling in the area.  Slight redness in the area. Follow these instructions at home: If you have a bandage:  Wash your hands with soap and water before you change your bandage (dressing). If soap and water are not available, use hand sanitizer.  Change your dressing as told by your health care provider. If your dressing becomes wet or dirty, or develops a bad smell, change it as soon as possible.  If your dressing sticks to your skin, soak it in warm water to loosen it. Wound care   Check your wound every day for signs of infection. Check for: ? More redness, swelling, or pain. ? Fluid or blood. ? Warmth. ? Pus or a bad smell.  Wash your hands with soap and water before and after touching your wound.  Apply cream or ointment only as directed by your health care provider. If you are using cream or ointment, wash the area with soap and water 2 times a day to remove all the cream or ointment. Rinse off the soap and pat the area dry with a clean towel.  If you have skin glue or adhesive strips on your wound, leave these closures in place. They may need to stay in place for 2 weeks or longer. If adhesive strip edges start to loosen and curl up, you may trim the loose edges. Do not remove adhesive strips completely unless your health care provider tells you to do that.  Keep the wound area dry and clean. Do not take baths, swim, or use a hot tub until your health care provider approves.  Continue to protect the wound from injury.  Do not pick at your wound. Picking can cause an infection.  When your wound has  completely healed, wear sunscreen over it or cover it with clothing when you are outside. New scars get sunburned easily, which can make scarring worse. General instructions  Take over-the-counter and prescription medicines only as told by your health care provider.  Keep all follow-up visits as told by your health care provider. This is important. Contact a health care provider if:  You have redness, swelling, or pain around your wound.  You have fluid or blood coming from your wound.  Your wound feels warm to the touch.  You have pus or a bad smell coming from your wound.  Your wound opens up. Get help right away if:  You have a fever.  You have redness that is spreading from your wound. Summary  After your sutures are removed, it is common to have some discomfort and swelling in the area.  Wash your hands with soap and water before you change your bandage (dressing).  Keep the wound area dry and clean. Do not take baths, swim, or use a hot tub until your health care provider approves. This information is not intended to replace advice given to you by your health care provider. Make sure you discuss any questions you have with your health care provider. Document Revised: 02/25/2017 Document Reviewed: 04/20/2016 Elsevier Patient Education  2020 Elsevier Inc.  

## 2019-08-14 NOTE — Progress Notes (Signed)
Subjective:    Patient ID: Rebecca Park, female    DOB: 1960-08-14, 59 y.o.   MRN: [id number]  HPI  Patient presents to the clinic today for ER follow-up.  She went to the ER 5/8 after cutting her right lower leg on a wooden cabinet.  The clean the wound, repaired the laceration with 7 sutures.  She was not placed on any oral antibiotics to fight having diabetes and prior amputation secondary to infected laceration.  Her last A1c was 7.4%, 06/2019.  She hassome mild redness and swelling but denies drainage from the wound.  She presents today for suture removal.  Review of Systems  Past Medical History:  Diagnosis Date  . Diabetes mellitus without complication ([place])   . Hypertension   . PONV (postoperative nausea and vomiting)    the day after rt 5th toe amputation surgery    Current Outpatient Medications  Medication Sig Dispense Refill  . amLODipine (NORVASC) 5 MG tablet TAKE 1 TABLET BY MOUTH EVERY DAY 90 tablet 0  . atorvastatin (LIPITOR) 40 MG tablet Take 1 tablet (40 mg total) by mouth daily. 90 tablet 0  . Ensure Max Protein (ENSURE MAX PROTEIN) LIQD Take 330 mLs (11 oz total) by mouth 2 (two) times daily between meals. (Patient not taking: Reported on 08/02/2019) 330 mL 0  . furosemide (LASIX) 20 MG tablet Take 1 tablet (20 mg total) by mouth daily. 90 tablet 1  . glipiZIDE (GLUCOTROL) 10 MG tablet Take 1 tablet (10 mg total) by mouth 2 (two) times daily before a meal. 90 tablet 0  . Lancets 30G MISC 1 each by Does not apply route 2 (two) times daily. 200 each 1  . losartan (COZAAR) 50 MG tablet TAKE 2 TABLETS BY MOUTH EVERY DAY 180 tablet 1  . Multiple Vitamins-Minerals (WOMENS MULTIVITAMIN PO) Take 1 tablet by mouth daily.     Glory Rosebush ULTRA test strip 1 EACH BY OTHER ROUTE 2 (TWO) TIMES DAILY. 200 strip 1  . Vitamin D, Ergocalciferol, (DRISDOL) 1.25 MG (50000 UNIT) CAPS capsule Take 1 capsule (50,000 Units total) by mouth every 7 (seven) days. 12 capsule 0   No current  facility-administered medications for this visit.    No Known Allergies  Family History  Problem Relation Age of Onset  . Heart disease Father   . Heart attack Father   . Heart disease Maternal Grandfather     Social History   Socioeconomic History  . Marital status: Married    Spouse name: Not on file  . Number of children: Not on file  . Years of education: Not on file  . Highest education level: Not on file  Occupational History  . Not on file  Tobacco Use  . Smoking status: Never Smoker  . Smokeless tobacco: Never Used  Substance and Sexual Activity  . Alcohol use: Not Currently    Comment: recently quit 01/2018, 3-4 beers QD  . Drug use: Never  . Sexual activity: Not on file  Other Topics Concern  . Not on file  Social History Narrative  . Not on file   Social Determinants of Health   Financial Resource Strain:   . Difficulty of Paying Living Expenses:   Food Insecurity:   . Worried About Charity fundraiser in the Last Year:   . Arboriculturist in the Last Year:   Transportation Needs:   . Film/video editor (Medical):   Marland Kitchen Lack of Transportation (Non-Medical):  Physical Activity:   . Days of Exercise per Week:   . Minutes of Exercise per Session:   Stress:   . Feeling of Stress :   Social Connections:   . Frequency of Communication with Friends and Family:   . Frequency of Social Gatherings with Friends and Family:   . Attends Religious Services:   . Active Member of Clubs or Organizations:   . Attends Archivist Meetings:   Marland Kitchen Marital Status:   Intimate Partner Violence:   . Fear of Current or Ex-Partner:   . Emotionally Abused:   Marland Kitchen Physically Abused:   . Sexually Abused:      Constitutional: Denies fever, malaise, fatigue, headache or abrupt weight changes.  Respiratory: Denies difficulty breathing, shortness of breath, cough or sputum production.   Cardiovascular: Pt reports swelling of BLE. Denies chest pain, chest tightness,  palpitations or swelling in the hands.  Skin: Pt reports laceration of right lower extremity. Denies redness, rashes, lesions or ulcercations.    No other specific complaints in a complete review of systems (except as listed in HPI above).     Objective:   Physical Exam  BP 138/90   Pulse 94   Temp 97.7 F (36.5 C) (Temporal)   Wt 245 lb (111.1 kg)   SpO2 97%   BMI 38.37 kg/m   Wt Readings from Last 3 Encounters:  08/04/19 240 lb (108.9 kg)  07/24/19 245 lb (111.1 kg)  12/05/18 210 lb (95.3 kg)    General: Appears her stated age, obese, in NAD. Skin: Triangular shaped laceration to RLE, with surrounding redness, wound edge intact. Cardiovascular: Normal rate and rhythm. 1+ pitting BLE edema. Pulmonary/Chest: Normal effort. Musculoskeletal: No difficulty with gait.  Neurological: Alert and oriented.     BMET    Component Value Date/Time   NA 134 (L) 07/24/2019 0911   K 5.1 07/24/2019 0911   CL 96 07/24/2019 0911   CO2 33 (H) 07/24/2019 0911   GLUCOSE 192 (H) 07/24/2019 0911   BUN 26 (H) 07/24/2019 0911   CREATININE 0.92 07/24/2019 0911   CREATININE 0.93 09/15/2018 1545   CALCIUM 9.3 07/24/2019 0911   GFRNONAA >60 11/11/2018 0356   GFRAA >60 11/11/2018 0356    Lipid Panel     Component Value Date/Time   CHOL 192 07/24/2019 0911   TRIG 84.0 07/24/2019 0911   HDL 64.60 07/24/2019 0911   CHOLHDL 3 07/24/2019 0911   VLDL 16.8 07/24/2019 0911   LDLCALC 111 (H) 07/24/2019 0911   LDLCALC 110 (H) 09/15/2018 1545    CBC    Component Value Date/Time   WBC 9.5 07/24/2019 0911   RBC 4.61 07/24/2019 0911   HGB 13.4 07/24/2019 0911   HCT 40.5 07/24/2019 0911   PLT 143.0 (L) 07/24/2019 0911   MCV 87.9 07/24/2019 0911   MCH 29.6 11/11/2018 0356   MCHC 33.2 07/24/2019 0911   RDW 14.5 07/24/2019 0911    Hgb A1C Lab Results  Component Value Date   HGBA1C 7.4 (H) 07/24/2019            Assessment & Plan:   ER follow up for Laceration of RLE:  ER  notes reviewed Sutures removed RX for Keflex 500 mg TID x 5 days Increase Furosemide to 40 mg daily, let me know if swelling does not improve Advised her to continue to monitor for redness, swelling, pain or drainage  Return precautions discussed Webb Silversmith, NP This visit occurred during the SARS-CoV-2 public health  emergency.  Safety protocols were in place, including screening questions prior to the visit, additional usage of staff PPE, and extensive cleaning of exam room while observing appropriate contact time as indicated for disinfecting solutions.

## 2019-09-02 ENCOUNTER — Other Ambulatory Visit: Payer: Self-pay | Admitting: Internal Medicine

## 2019-09-02 DIAGNOSIS — E1159 Type 2 diabetes mellitus with other circulatory complications: Secondary | ICD-10-CM

## 2019-09-10 ENCOUNTER — Encounter: Payer: Self-pay | Admitting: Internal Medicine

## 2019-09-10 MED ORDER — FUROSEMIDE 40 MG PO TABS: 40.0000 mg | ORAL_TABLET | Freq: Every day | ORAL | 0 refills | Status: DC

## 2019-09-13 ENCOUNTER — Other Ambulatory Visit
Admission: RE | Admit: 2019-09-13 | Discharge: 2019-09-13 | Disposition: A | Discharge status: Home / Self Care | Payer: BC Managed Care – PPO | Source: Ambulatory Visit | Attending: Gastroenterology | Admitting: Gastroenterology

## 2019-09-13 ENCOUNTER — Other Ambulatory Visit: Payer: Self-pay

## 2019-09-13 DIAGNOSIS — Z01812 Encounter for preprocedural laboratory examination: Secondary | ICD-10-CM | POA: Diagnosis not present

## 2019-09-13 DIAGNOSIS — Z20822 Contact with and (suspected) exposure to covid-19: Secondary | ICD-10-CM | POA: Insufficient documentation

## 2019-09-13 LAB — SARS CORONAVIRUS 2 (TAT 6-24 HRS): SARS Coronavirus 2: NEGATIVE

## 2019-09-17 ENCOUNTER — Encounter
Admission: RE | Disposition: A | Discharge status: Home / Self Care | Payer: Self-pay | Source: Home / Self Care | Attending: Gastroenterology

## 2019-09-17 ENCOUNTER — Ambulatory Visit
Admission: RE | Admit: 2019-09-17 | Discharge: 2019-09-17 | Disposition: A | Discharge status: Home / Self Care | Payer: BC Managed Care – PPO | Attending: Gastroenterology | Admitting: Gastroenterology

## 2019-09-17 ENCOUNTER — Encounter: Payer: Self-pay | Admitting: Gastroenterology

## 2019-09-17 ENCOUNTER — Other Ambulatory Visit: Payer: Self-pay

## 2019-09-17 ENCOUNTER — Ambulatory Visit: Payer: BC Managed Care – PPO | Admitting: Certified Registered Nurse Anesthetist

## 2019-09-17 DIAGNOSIS — K635 Polyp of colon: Secondary | ICD-10-CM | POA: Diagnosis not present

## 2019-09-17 DIAGNOSIS — Z7984 Long term (current) use of oral hypoglycemic drugs: Secondary | ICD-10-CM | POA: Diagnosis not present

## 2019-09-17 DIAGNOSIS — Z860100 Personal history of colon polyps, unspecified: Secondary | ICD-10-CM

## 2019-09-17 DIAGNOSIS — Z1211 Encounter for screening for malignant neoplasm of colon: Secondary | ICD-10-CM | POA: Diagnosis not present

## 2019-09-17 DIAGNOSIS — Z6838 Body mass index (BMI) 38.0-38.9, adult: Secondary | ICD-10-CM | POA: Diagnosis not present

## 2019-09-17 DIAGNOSIS — E119 Type 2 diabetes mellitus without complications: Secondary | ICD-10-CM | POA: Diagnosis not present

## 2019-09-17 DIAGNOSIS — Z79899 Other long term (current) drug therapy: Secondary | ICD-10-CM | POA: Insufficient documentation

## 2019-09-17 DIAGNOSIS — Z89431 Acquired absence of right foot: Secondary | ICD-10-CM | POA: Diagnosis not present

## 2019-09-17 DIAGNOSIS — Z09 Encounter for follow-up examination after completed treatment for conditions other than malignant neoplasm: Secondary | ICD-10-CM | POA: Diagnosis not present

## 2019-09-17 DIAGNOSIS — I10 Essential (primary) hypertension: Secondary | ICD-10-CM | POA: Insufficient documentation

## 2019-09-17 HISTORY — PX: COLONOSCOPY WITH PROPOFOL: SHX5780

## 2019-09-17 LAB — GLUCOSE, CAPILLARY: Glucose-Capillary: 171 mg/dL — ABNORMAL HIGH (ref 70–99)

## 2019-09-17 SURGERY — COLONOSCOPY WITH PROPOFOL
Anesthesia: General

## 2019-09-17 MED ORDER — PROPOFOL 500 MG/50ML IV EMUL
INTRAVENOUS | Status: DC | PRN
  Administered 2019-09-17: 160 ug/kg/min via INTRAVENOUS

## 2019-09-17 MED ORDER — PROPOFOL 10 MG/ML IV BOLUS
INTRAVENOUS | Status: DC | PRN
  Administered 2019-09-17: 80 mg via INTRAVENOUS
  Administered 2019-09-17: 20 mg via INTRAVENOUS

## 2019-09-17 MED ORDER — SODIUM CHLORIDE 0.9 % IV SOLN
INTRAVENOUS | Status: DC
  Administered 2019-09-17: 1000 mL via INTRAVENOUS

## 2019-09-17 NOTE — Anesthesia Postprocedure Evaluation (Signed)
Anesthesia Post Note  Patient: Rebecca Park  Procedure(s) Performed: COLONOSCOPY WITH PROPOFOL (N/A )  Patient location during evaluation: Endoscopy Anesthesia Type: General Level of consciousness: awake and alert Pain management: pain level controlled Vital Signs Assessment: post-procedure vital signs reviewed and stable Respiratory status: spontaneous breathing, nonlabored ventilation, respiratory function stable and patient connected to nasal cannula oxygen Cardiovascular status: blood pressure returned to baseline and stable Postop Assessment: no apparent nausea or vomiting Anesthetic complications: no   No complications documented.   Last Vitals:  Vitals:   09/17/19 1000 09/17/19 1010  BP: (!) 83/55 98/61  Pulse: 80 81  Resp: 20 18  Temp:    SpO2: 98% 98%    Last Pain:  Vitals:   09/17/19 0940  TempSrc: Tympanic  PainSc:                  Martha Clan

## 2019-09-17 NOTE — Op Note (Signed)
[facility] Gastroenterology Patient Name: Rebecca Park Procedure Date: 09/17/2019 9:18 AM MRN: [id number] Account #: [id number] Date of Birth: November 09, 1960 Admit Type: Outpatient Age: 59 Room: [facility] ENDO ROOM 1 Gender: Female Note Status: Finalized Procedure:             Colonoscopy Indications:           High risk colon cancer surveillance: Personal history                         of adenoma (10 mm or greater in size), High risk colon                         cancer surveillance: Personal history of sessile                         serrated colon polyp with dysplasia, Last colonoscopy:                         June 2020 Providers:             Lin Landsman MD, MD Referring MD:          Jearld Fenton (Referring MD) Medicines:             Monitored Anesthesia Care Complications:         No immediate complications. Estimated blood loss: None. Procedure:             Pre-Anesthesia Assessment:                        - Prior to the procedure, a History and Physical was                         performed, and patient medications and allergies were                         reviewed. The patient is competent. The risks and                         benefits of the procedure and the sedation options and                         risks were discussed with the patient. All questions                         were answered and informed consent was obtained.                         Patient identification and proposed procedure were                         verified by the physician, the nurse, the                         anesthesiologist, the anesthetist and the technician                         in the pre-procedure area in the procedure room in the  endoscopy suite. Mental Status Examination: alert and                         oriented. Airway Examination: normal oropharyngeal                         airway and neck mobility. Respiratory Examination:                          clear to auscultation. CV Examination: normal.                         Prophylactic Antibiotics: The patient does not require                         prophylactic antibiotics. Prior Anticoagulants: The                         patient has taken no previous anticoagulant or                         antiplatelet agents. ASA Grade Assessment: III - A                         patient with severe systemic disease. After reviewing                         the risks and benefits, the patient was deemed in                         satisfactory condition to undergo the procedure. The                         anesthesia plan was to use monitored anesthesia care                         (MAC). Immediately prior to administration of                         medications, the patient was re-assessed for adequacy                         to receive sedatives. The heart rate, respiratory                         rate, oxygen saturations, blood pressure, adequacy of                         pulmonary ventilation, and response to care were                         monitored throughout the procedure. The physical                         status of the patient was re-assessed after the                         procedure.  After obtaining informed consent, the colonoscope was                         passed under direct vision. Throughout the procedure,                         the patient's blood pressure, pulse, and oxygen                         saturations were monitored continuously. The                         Colonoscope was introduced through the anus and                         advanced to the the cecum, identified by appendiceal                         orifice and ileocecal valve. The colonoscopy was                         performed without difficulty. The patient tolerated                         the procedure well. The quality of the bowel                         preparation was  evaluated using the BBPS [facility] Bowel                         Preparation Scale) with scores of: Right Colon = 3,                         Transverse Colon = 3 and Left Colon = 3 (entire mucosa                         seen well with no residual staining, small fragments                         of stool or opaque liquid). The total BBPS score                         equals 9. Findings:      The perianal and digital rectal examinations were normal. Pertinent       negatives include normal sphincter tone and no palpable rectal lesions.      A 3 mm polyp was found in the recto-sigmoid colon. The polyp was       sessile. The polyp was removed with a cold snare. Resection and       retrieval were complete.      The exam was otherwise without abnormality.      The retroflexed view of the distal rectum and anal verge was normal and       showed no anal or rectal abnormalities. Impression:            - One 3 mm polyp at the recto-sigmoid colon, removed  with a cold snare. Resected and retrieved.                        - The examination was otherwise normal.                        - The distal rectum and anal verge are normal on                         retroflexion view. Recommendation:        - Discharge patient to home (with escort).                        - Resume previous diet today.                        - Continue present medications.                        - Await pathology results.                        - Repeat colonoscopy in 5 years for surveillance. Procedure Code(s):     --- Professional ---                        [phone], Colonoscopy, flexible; with removal of                         tumor(s), polyp(s), or other lesion(s) by snare                         technique Diagnosis Code(s):     --- Professional ---                        K63.5, Polyp of colon                        Z86.010, Personal history of colonic polyps CPT copyright 2019 American Medical  Association. All rights reserved. The codes documented in this report are preliminary and upon coder review may  be revised to meet current compliance requirements. Dr. Ulyess Mort Lin Landsman MD, MD 09/17/2019 9:43:24 AM This report has been signed electronically. Number of Addenda: 0 Note Initiated On: 09/17/2019 9:18 AM Scope Withdrawal Time: 0 hours 10 minutes 14 seconds  Total Procedure Duration: 0 hours 12 minutes 56 seconds  Estimated Blood Loss:  Estimated blood loss: none.      [facility]

## 2019-09-17 NOTE — H&P (Signed)
[name], MD [address]  [place]  [place], [place] [address]  Main: [phone]  Fax: [phone] Pager: [phone]  Primary Care Physician:  Jearld Fenton, NP Primary Gastroenterologist:  Dr. [name]  Pre-Procedure History & Physical: HPI:  Rebecca Park is a 59 y.o. female is here for an colonoscopy.   Past Medical History:  Diagnosis Date  . Diabetes mellitus without complication ([place])   . Hypertension   . PONV (postoperative nausea and vomiting)    the day after rt 5th toe amputation surgery    Past Surgical History:  Procedure Laterality Date  . AMPUTATION Right 11/10/2018   Procedure: AMPUTATION RAY (amputation 5th metatarsal Right foot;  Surgeon: Caroline More, DPM;  Location: [facility] ORS;  Service: Podiatry;  Laterality: Right;  . AMPUTATION TOE Right 12/05/2018   Procedure: RAY RIGHT 4TH AMPUTATION;  Surgeon: Caroline More, DPM;  Location: [place];  Service: Podiatry;  Laterality: Right;  mac with local DIABETIC-oral meds  . BONE BIOPSY Right 11/10/2018   Procedure: BONE BIOPSY- Right 4th toe;  Surgeon: Caroline More, DPM;  Location: [facility] ORS;  Service: Podiatry;  Laterality: Right;  . BUNIONECTOMY Bilateral   . COLONOSCOPY WITH PROPOFOL N/A 09/07/2018   Procedure: COLONOSCOPY WITH PROPOFOL;  Surgeon: Lin Landsman, MD;  Location[facility] END[facility]COPY;  Service: Gastroenterology;  Laterality: N/A;    Prior to Admission medications   Medication Sig Start Date End Date Taking? Authorizing Provider  amLODipine (NORVASC) 5 MG tablet TAKE 1 TABLET BY MOUTH EVERY DAY 09/03/19  Yes Baity, Coralie Keens, NP  furosemide (LASIX) 40 MG tablet Take 1 tablet (40 mg total) by mouth daily. 09/10/19  Yes Baity, Coralie Keens, NP  glipiZIDE (GLUCOTROL) 10 MG tablet TAKE 1 TABLET (10 MG TOTAL) BY MOUTH 2 (TWO) TIMES DAILY BEFORE A MEAL. 09/03/19  Yes Baity, Coralie Keens, NP  losartan (COZAAR) 50 MG tablet TAKE 2 TABLETS BY MOUTH EVERY DAY 08/11/19  Yes Baity, Coralie Keens,  NP  atorvastatin (LIPITOR) 40 MG tablet Take 1 tablet (40 mg total) by mouth daily. 07/27/19   Jearld Fenton, NP  cephALEXin (KEFLEX) 500 MG capsule Take 1 capsule (500 mg total) by mouth 3 (three) times daily. Patient not taking: Reported on 09/17/2019 08/14/19   Jearld Fenton, NP  Lancets 30G MISC 1 each by Does not apply route 2 (two) times daily. 01/18/19   Jearld Fenton, NP  Multiple Vitamins-Minerals (WOMENS MULTIVITAMIN PO) Take 1 tablet by mouth daily.     [provider]  ONETOUCH ULTRA test strip 1 EACH BY OTHER ROUTE 2 (TWO) TIMES DAILY. 07/11/19   Jearld Fenton, NP  Vitamin D, Ergocalciferol, (DRISDOL) 1.25 MG (50000 UNIT) CAPS capsule Take 1 capsule (50,000 Units total) by mouth every 7 (seven) days. 07/27/19   Jearld Fenton, NP    Allergies as of 08/02/2019  . (No Known Allergies)    Family History  Problem Relation Age of Onset  . Heart disease Father   . Heart attack Father   . Heart disease Maternal Grandfather     Social History   Socioeconomic History  . Marital status: Married    Spouse name: Not on file  . Number of children: Not on file  . Years of education: Not on file  . Highest education level: Not on file  Occupational History  . Not on file  Tobacco Use  . Smoking status: Never Smoker  . Smokeless tobacco: Never Used  Substance and Sexual Activity  . Alcohol use: Not Currently    Comment: recently quit 01/2018, 3-4 beers QD  . Drug use: Never  . Sexual activity: Not on file  Other Topics Concern  . Not on file  Social History Narrative  . Not on file   Social Determinants of Health   Financial Resource Strain:   . Difficulty of Paying Living Expenses:   Food Insecurity:   . Worried About Charity fundraiser in the Last Year:   . Arboriculturist in the Last Year:   Transportation Needs:   . Film/video editor (Medical):   Marland Kitchen Lack of Transportation (Non-Medical):   Physical Activity:   . Days of Exercise per Week:   .  Minutes of Exercise per Session:   Stress:   . Feeling of Stress :   Social Connections:   . Frequency of Communication with Friends and Family:   . Frequency of Social Gatherings with Friends and Family:   . Attends Religious Services:   . Active Member of Clubs or Organizations:   . Attends Archivist Meetings:   Marland Kitchen Marital Status:   Intimate Partner Violence:   . Fear of Current or Ex-Partner:   . Emotionally Abused:   Marland Kitchen Physically Abused:   . Sexually Abused:     Review of Systems: See HPI, otherwise negative ROS  Physical Exam: BP 138/80   Pulse 97   Temp (!) 96 F (35.6 C) (Tympanic)   Resp 18   Ht _0  (1.676 m)   Wt 108.9 kg   SpO2 99%   BMI 38.74 kg/m  General:   Alert,  pleasant and cooperative in NAD Head:  Normocephalic and atraumatic. Neck:  Supple; no masses or thyromegaly. Lungs:  Clear throughout to auscultation.    Heart:  Regular rate and rhythm. Abdomen:  Soft, nontender and nondistended. Normal bowel sounds, without guarding, and without rebound.   Neurologic:  Alert and  oriented x4;  grossly normal neurologically.  Impression/Plan: Rebecca Park is here for an colonoscopy to be performed for h/o colon polyps  Risks, benefits, limitations, and alternatives regarding  colonoscopy have been reviewed with the patient.  Questions have been answered.  All parties agreeable.   Sherri Sear, MD  09/17/2019, 9:11 AM

## 2019-09-17 NOTE — Transfer of Care (Signed)
Immediate Anesthesia Transfer of Care Note  Patient: Rebecca Park  Procedure(s) Performed: COLONOSCOPY WITH PROPOFOL (N/A )  Patient Location: PACU  Anesthesia Type:General  Level of Consciousness: sedated  Airway & Oxygen Therapy: Patient Spontanous Breathing and Patient connected to nasal cannula oxygen  Post-op Assessment: Report given, VSS  Post vital signs: Reviewed and stable  Last Vitals:  Vitals Value Taken Time  BP    Temp    Pulse    Resp    SpO2      Last Pain:  Vitals:   09/17/19 0837  TempSrc: Tympanic  PainSc: 0-No pain         Complications: No complications documented.

## 2019-09-17 NOTE — Anesthesia Preprocedure Evaluation (Signed)
Anesthesia Evaluation  Patient identified by MRN, date of birth, ID band Patient awake    Reviewed: Allergy & Precautions, H&P , NPO status , reviewed documented beta blocker date and time   History of Anesthesia Complications (+) PONV and history of anesthetic complications  Airway Mallampati: II  TM Distance: >3 FB Neck ROM: full    Dental  (+) Teeth Intact, Caps, Dental Advidsory Given   Pulmonary neg pulmonary ROS,    Pulmonary exam normal        Cardiovascular Exercise Tolerance: Good hypertension, Pt. on medications (-) angina(-) Past MI and (-) Cardiac Stents Normal cardiovascular exam(-) dysrhythmias (-) Valvular Problems/Murmurs     Neuro/Psych negative neurological ROS  negative psych ROS   GI/Hepatic negative GI ROS, Neg liver ROS,   Endo/Other  diabetes, Type 2, Oral Hypoglycemic AgentsMorbid obesity  Renal/GU negative Renal ROS  negative genitourinary   Musculoskeletal negative musculoskeletal ROS (+)   Abdominal   Peds negative pediatric ROS (+)  Hematology negative hematology ROS (+)   Anesthesia Other Findings Past Medical History: No date: Diabetes mellitus without complication (H[facility]) No date: Hypertension  Past Surgical History: No date: BUNIONECTOMY; Bilateral     Reproductive/Obstetrics negative OB ROS                             Anesthesia Physical  Anesthesia Plan  ASA: III  Anesthesia Plan: General   Post-op Pain Management:    Induction: Intravenous  PONV Risk Score and Plan: 4 or greater and Treatment may vary due to age or medical condition, TIVA and Propofol infusion  Airway Management Planned: Natural Airway and Nasal Cannula  Additional Equipment:   Intra-op Plan:   Post-operative Plan: Extubation in OR  Informed Consent: I have reviewed the patients History and Physical, chart, labs and discussed the procedure including the risks,  benefits and alternatives for the proposed anesthesia with the patient or authorized representative who has indicated his/her understanding and acceptance.     Dental Advisory Given  Plan Discussed with: CRNA  Anesthesia Plan Comments:         Anesthesia Quick Evaluation

## 2019-09-18 ENCOUNTER — Encounter: Payer: Self-pay | Admitting: Gastroenterology

## 2019-09-18 LAB — SURGICAL PATHOLOGY

## 2019-09-20 ENCOUNTER — Ambulatory Visit
Admission: RE | Admit: 2019-09-20 | Discharge: 2019-09-20 | Disposition: A | Discharge status: Home / Self Care | Payer: BC Managed Care – PPO | Source: Ambulatory Visit | Attending: Internal Medicine | Admitting: Internal Medicine

## 2019-09-20 DIAGNOSIS — Z780 Asymptomatic menopausal state: Secondary | ICD-10-CM

## 2019-09-20 DIAGNOSIS — Z0001 Encounter for general adult medical examination with abnormal findings: Secondary | ICD-10-CM | POA: Diagnosis not present

## 2019-09-20 DIAGNOSIS — Z13820 Encounter for screening for osteoporosis: Secondary | ICD-10-CM | POA: Diagnosis not present

## 2019-09-20 DIAGNOSIS — N6489 Other specified disorders of breast: Secondary | ICD-10-CM | POA: Diagnosis not present

## 2019-09-20 DIAGNOSIS — R922 Inconclusive mammogram: Secondary | ICD-10-CM | POA: Diagnosis not present

## 2019-10-11 ENCOUNTER — Other Ambulatory Visit: Payer: Self-pay | Admitting: Internal Medicine

## 2019-10-12 ENCOUNTER — Other Ambulatory Visit: Payer: Self-pay | Admitting: Internal Medicine

## 2019-10-15 DIAGNOSIS — E1142 Type 2 diabetes mellitus with diabetic polyneuropathy: Secondary | ICD-10-CM | POA: Diagnosis not present

## 2019-10-15 DIAGNOSIS — B351 Tinea unguium: Secondary | ICD-10-CM | POA: Diagnosis not present

## 2019-10-17 ENCOUNTER — Other Ambulatory Visit: Payer: Self-pay | Admitting: Internal Medicine

## 2019-10-17 DIAGNOSIS — E1159 Type 2 diabetes mellitus with other circulatory complications: Secondary | ICD-10-CM

## 2019-10-18 ENCOUNTER — Other Ambulatory Visit: Payer: Self-pay | Admitting: Internal Medicine

## 2019-10-22 ENCOUNTER — Ambulatory Visit: Payer: BC Managed Care – PPO | Admitting: Internal Medicine

## 2019-10-22 ENCOUNTER — Other Ambulatory Visit: Payer: Self-pay

## 2019-10-22 ENCOUNTER — Encounter: Payer: Self-pay | Admitting: Internal Medicine

## 2019-10-22 VITALS — BP 126/86 | HR 98 | Temp 97.9°F | Wt 241.0 lb

## 2019-10-22 DIAGNOSIS — E782 Mixed hyperlipidemia: Secondary | ICD-10-CM

## 2019-10-22 DIAGNOSIS — E559 Vitamin D deficiency, unspecified: Secondary | ICD-10-CM | POA: Diagnosis not present

## 2019-10-22 DIAGNOSIS — I10 Essential (primary) hypertension: Secondary | ICD-10-CM

## 2019-10-22 DIAGNOSIS — E1159 Type 2 diabetes mellitus with other circulatory complications: Secondary | ICD-10-CM | POA: Diagnosis not present

## 2019-10-22 DIAGNOSIS — R609 Edema, unspecified: Secondary | ICD-10-CM

## 2019-10-22 NOTE — Progress Notes (Signed)
Subjective:    Patient ID: Rebecca Park, female    DOB: 02-25-1961, 59 y.o.   MRN: [id number]  HPI  Patient presents the clinic today for 32-month follow-up of chronic conditions.  [place] Hypertension: Her BP today is 126/86.  She is taking Furosemide, Amlodipine and Losartan as prescribed.  ECG from 10/2018 reviewed.  DM2: Her last A1c was 7.4%, 06/2019.  She is taking Glipizide as prescribed.  Her sugars average 150.  She checks her feet routinely and follows with podiatry.  Her last eye exam was 05/2019.  Flu 02/2018.  Pneumovax 02/2018.  Covid-Moderna.  HLD: Her last LDL was 111, 06/2019.  She denies myalgias on Atorvastatin.  She has been trying to consume a low-fat diet.  Vitamin D Deficiency: Recently completed 12 weeks of Ergocalciferol.  She is not currently taking any Calcium or Vitamin D OTC.  Review of Systems      Past Medical History:  Diagnosis Date  . Diabetes mellitus without complication ([place])   . Hypertension   . PONV (postoperative nausea and vomiting)    the day after rt 5th toe amputation surgery    Current Outpatient Medications  Medication Sig Dispense Refill  . amLODipine (NORVASC) 5 MG tablet TAKE 1 TABLET BY MOUTH EVERY DAY 90 tablet 0  . atorvastatin (LIPITOR) 40 MG tablet TAKE 1 TABLET BY MOUTH EVERY DAY 90 tablet 0  . furosemide (LASIX) 40 MG tablet Take 1 tablet (40 mg total) by mouth daily. 90 tablet 0  . glipiZIDE (GLUCOTROL) 10 MG tablet TAKE 1 TABLET (10 MG TOTAL) BY MOUTH 2 (TWO) TIMES DAILY BEFORE A MEAL. 90 tablet 0  . Lancets 30G MISC 1 each by Does not apply route 2 (two) times daily. 200 each 1  . losartan (COZAAR) 50 MG tablet TAKE 2 TABLETS BY MOUTH EVERY DAY 180 tablet 1  . Multiple Vitamins-Minerals (WOMENS MULTIVITAMIN PO) Take 1 tablet by mouth daily.     Glory Rosebush ULTRA test strip 1 EACH BY OTHER ROUTE 2 (TWO) TIMES DAILY. 200 strip 1  . Vitamin D, Ergocalciferol, (DRISDOL) 1.25 MG (50000 UNIT) CAPS capsule Take 1 capsule (50,000  Units total) by mouth every 7 (seven) days. 12 capsule 0   No current facility-administered medications for this visit.    No Known Allergies  Family History  Problem Relation Age of Onset  . Heart disease Father   . Heart attack Father   . Heart disease Maternal Grandfather     Social History   Socioeconomic History  . Marital status: Married    Spouse name: Not on file  . Number of children: Not on file  . Years of education: Not on file  . Highest education level: Not on file  Occupational History  . Not on file  Tobacco Use  . Smoking status: Never Smoker  . Smokeless tobacco: Never Used  Substance and Sexual Activity  . Alcohol use: Not Currently    Comment: recently quit 01/2018, 3-4 beers QD  . Drug use: Never  . Sexual activity: Not on file  Other Topics Concern  . Not on file  Social History Narrative  . Not on file   Social Determinants of Health   Financial Resource Strain:   . Difficulty of Paying Living Expenses:   Food Insecurity:   . Worried About Charity fundraiser in the Last Year:   . Arboriculturist in the Last Year:   Transportation Needs:   . Lack  of Transportation (Medical):   Marland Kitchen Lack of Transportation (Non-Medical):   Physical Activity:   . Days of Exercise per Week:   . Minutes of Exercise per Session:   Stress:   . Feeling of Stress :   Social Connections:   . Frequency of Communication with Friends and Family:   . Frequency of Social Gatherings with Friends and Family:   . Attends Religious Services:   . Active Member of Clubs or Organizations:   . Attends Archivist Meetings:   Marland Kitchen Marital Status:   Intimate Partner Violence:   . Fear of Current or Ex-Partner:   . Emotionally Abused:   Marland Kitchen Physically Abused:   . Sexually Abused:      Constitutional: Denies fever, malaise, fatigue, headache or abrupt weight changes.  Respiratory: Denies difficulty breathing, shortness of breath, cough or sputum production.     Cardiovascular: Pt reports swelling in BLE. Denies chest pain, chest tightness, palpitations or swelling in the hands.  Skin: Denies redness, rashes, lesions or ulcercations.  Neurological: Denies numbness, tingling or problems with balance and coordination.    No other specific complaints in a complete review of systems (except as listed in HPI above).  Objective:   Physical Exam   BP (!) 126/86   Pulse 98   Temp 97.9 F (36.6 C) (Temporal)   Wt (!) 241 lb (109.3 kg)   SpO2 98%   BMI 38.90 kg/m   Wt Readings from Last 3 Encounters:  09/17/19 240 lb (108.9 kg)  08/14/19 245 lb (111.1 kg)  08/04/19 240 lb (108.9 kg)    General: Appears her stated age, obese, in NAD. Skin: Warm, dry and intact. Vascular changes noted of BLE. No ulcerations noted. Amputation noted of toes of right foot. HEENT: Head: normal shape and size; Eyes: sclera white, no icterus, conjunctiva pink, PERRLA and EOMs intact;  Cardiovascular: Normal rate and rhythm. S1,S2 noted.  No murmur, rubs or gallops noted. 1+ pitting BLE edema. No carotid bruits noted. Pulmonary/Chest: Normal effort and positive vesicular breath sounds. No respiratory distress. No wheezes, rales or ronchi noted.  Musculoskeletal:  No difficulty with gait.  Neurological: Alert and oriented.    BMET    Component Value Date/Time   NA 134 (L) 07/24/2019 0911   K 5.1 07/24/2019 0911   CL 96 07/24/2019 0911   CO2 33 (H) 07/24/2019 0911   GLUCOSE 192 (H) 07/24/2019 0911   BUN 26 (H) 07/24/2019 0911   CREATININE 0.92 07/24/2019 0911   CREATININE 0.93 09/15/2018 1545   CALCIUM 9.3 07/24/2019 0911   GFRNONAA >60 11/11/2018 0356   GFRAA >60 11/11/2018 0356    Lipid Panel     Component Value Date/Time   CHOL 192 07/24/2019 0911   TRIG 84.0 07/24/2019 0911   HDL 64.60 07/24/2019 0911   CHOLHDL 3 07/24/2019 0911   VLDL 16.8 07/24/2019 0911   LDLCALC 111 (H) 07/24/2019 0911   LDLCALC 110 (H) 09/15/2018 1545    CBC     Component Value Date/Time   WBC 9.5 07/24/2019 0911   RBC 4.61 07/24/2019 0911   HGB 13.4 07/24/2019 0911   HCT 40.5 07/24/2019 0911   PLT 143.0 (L) 07/24/2019 0911   MCV 87.9 07/24/2019 0911   MCH 29.6 11/11/2018 0356   MCHC 33.2 07/24/2019 0911   RDW 14.5 07/24/2019 0911    Hgb A1C Lab Results  Component Value Date   HGBA1C 7.4 (H) 07/24/2019  Assessment & Plan:   Peripheral Edema:  Will check BNP with labs 2D echo ordered for further evaluation  Vit D Deficiency:  Will obtain Vit D level with labs  Will follow up after labs, return precautions discussed  Webb Silversmith, NP This visit occurred during the SARS-CoV-2 public health emergency.  Safety protocols were in place, including screening questions prior to the visit, additional usage of staff PPE, and extensive cleaning of exam room while observing appropriate contact time as indicated for disinfecting solutions.

## 2019-10-22 NOTE — Assessment & Plan Note (Signed)
Controlled on Furosemide, Amlodipine and Losartan Reinforced DASH diet and exercise for weight loss C met ordered

## 2019-10-22 NOTE — Patient Instructions (Signed)
Peripheral Edema  Peripheral edema is swelling that is caused by a buildup of fluid. Peripheral edema most often affects the lower legs, ankles, and feet. It can also develop in the arms, hands, and face. The area of the body that has peripheral edema will look swollen. It may also feel heavy or warm. Your clothes may start to feel tight. Pressing on the area may make a temporary dent in your skin. You may not be able to move your swollen arm or leg as much as usual. There are many causes of peripheral edema. It can happen because of a complication of other conditions such as congestive heart failure, kidney disease, or a problem with your blood circulation. It also can be a side effect of certain medicines or because of an infection. It often happens to women during pregnancy. Sometimes, the cause is not known. Follow these instructions at home: Managing pain, stiffness, and swelling   Raise (elevate) your legs while you are sitting or lying down.  Move around often to prevent stiffness and to lessen swelling.  Do not sit or stand for long periods of time.  Wear support stockings as told by your health care provider. Medicines  Take over-the-counter and prescription medicines only as told by your health care provider.  Your health care provider may prescribe medicine to help your body get rid of excess water (diuretic). General instructions  Pay attention to any changes in your symptoms.  Follow instructions from your health care provider about limiting salt (sodium) in your diet. Sometimes, eating less salt may reduce swelling.  Moisturize skin daily to help prevent skin from cracking and draining.  Keep all follow-up visits as told by your health care provider. This is important. Contact a health care provider if you have:  A fever.  Edema that starts suddenly or is getting worse, especially if you are pregnant or have a medical condition.  Swelling in only one leg.  Increased  swelling, redness, or pain in one or both of your legs.  Drainage or sores at the area where you have edema. Get help right away if you:  Develop shortness of breath, especially when you are lying down.  Have pain in your chest or abdomen.  Feel weak.  Feel faint. Summary  Peripheral edema is swelling that is caused by a buildup of fluid. Peripheral edema most often affects the lower legs, ankles, and feet.  Move around often to prevent stiffness and to lessen swelling. Do not sit or stand for long periods of time.  Pay attention to any changes in your symptoms.  Contact a health care provider if you have edema that starts suddenly or is getting worse, especially if you are pregnant or have a medical condition.  Get help right away if you develop shortness of breath, especially when lying down. This information is not intended to replace advice given to you by your health care provider. Make sure you discuss any questions you have with your health care provider. Document Revised: 12/07/2017 Document Reviewed: 12/07/2017 Elsevier Patient Education  2020 [employer]  

## 2019-10-22 NOTE — Assessment & Plan Note (Signed)
C met and lipid profile ordered Encouraged her to consume a low-fat diet Continue Atorvastatin for now

## 2019-10-22 NOTE — Assessment & Plan Note (Signed)
A1c ordered No urine microalbumin secondary to ARB therapy Continue Glipizide Encouraged her to consume a low carb diet and exercise for weight loss Encouraged routine foot exams-follows with podiatry Encouraged routine eye exams, scheduled for November Flu, Pneumovax and Covid UTD

## 2019-10-24 ENCOUNTER — Other Ambulatory Visit (INDEPENDENT_AMBULATORY_CARE_PROVIDER_SITE_OTHER): Payer: BC Managed Care – PPO

## 2019-10-24 ENCOUNTER — Other Ambulatory Visit: Payer: Self-pay

## 2019-10-24 DIAGNOSIS — R609 Edema, unspecified: Secondary | ICD-10-CM

## 2019-10-24 DIAGNOSIS — E782 Mixed hyperlipidemia: Secondary | ICD-10-CM | POA: Diagnosis not present

## 2019-10-24 DIAGNOSIS — I10 Essential (primary) hypertension: Secondary | ICD-10-CM | POA: Diagnosis not present

## 2019-10-24 DIAGNOSIS — E559 Vitamin D deficiency, unspecified: Secondary | ICD-10-CM

## 2019-10-24 DIAGNOSIS — E1159 Type 2 diabetes mellitus with other circulatory complications: Secondary | ICD-10-CM | POA: Diagnosis not present

## 2019-10-25 LAB — LIPID PANEL
Cholesterol: 170 mg/dL (ref 0–200)
HDL: 63.5 mg/dL (ref >39.00)
LDL Cholesterol: 74 mg/dL (ref 0–99)
NonHDL: 106.66
Total CHOL/HDL Ratio: 3
Triglycerides: 162 mg/dL — ABNORMAL HIGH (ref 0.0–149.0)
VLDL: 32.4 mg/dL (ref 0.0–40.0)

## 2019-10-25 LAB — COMPREHENSIVE METABOLIC PANEL
ALT: 20 U/L (ref 0–35)
AST: 16 U/L (ref 0–37)
Albumin: 4.2 g/dL (ref 3.5–5.2)
Alkaline Phosphatase: 153 U/L — ABNORMAL HIGH (ref 39–117)
BUN: 24 mg/dL — ABNORMAL HIGH (ref 6–23)
CO2: 31 mEq/L (ref 19–32)
Calcium: 9.3 mg/dL (ref 8.4–10.5)
Chloride: 99 mEq/L (ref 96–112)
Creatinine, Ser: 0.92 mg/dL (ref 0.40–1.20)
GFR: 62.47 mL/min (ref >60.00)
Glucose, Bld: 188 mg/dL — ABNORMAL HIGH (ref 70–99)
Potassium: 4 mEq/L (ref 3.5–5.1)
Sodium: 137 mEq/L (ref 135–145)
Total Bilirubin: 0.5 mg/dL (ref 0.2–1.2)
Total Protein: 7.3 g/dL (ref 6.0–8.3)

## 2019-10-25 LAB — BRAIN NATRIURETIC PEPTIDE: Pro B Natriuretic peptide (BNP): 59 pg/mL (ref 0.0–100.0)

## 2019-10-25 LAB — VITAMIN D 25 HYDROXY (VIT D DEFICIENCY, FRACTURES): VITD: 29.95 ng/mL — ABNORMAL LOW (ref 30.00–100.00)

## 2019-10-25 LAB — HEMOGLOBIN A1C: Hgb A1c MFr Bld: 7.5 % — ABNORMAL HIGH (ref 4.6–6.5)

## 2019-10-25 MED ORDER — TORSEMIDE 20 MG PO TABS: 20.0000 mg | ORAL_TABLET | Freq: Every day | ORAL | 0 refills | Status: DC

## 2019-10-25 NOTE — Addendum Note (Signed)
Addended by: Jearld Fenton on: 10/25/2019 11:16 AM   Modules accepted: Orders

## 2019-10-26 ENCOUNTER — Encounter: Payer: Self-pay | Admitting: Internal Medicine

## 2019-11-05 ENCOUNTER — Ambulatory Visit (HOSPITAL_COMMUNITY): Payer: BC Managed Care – PPO | Attending: Cardiology

## 2019-11-05 ENCOUNTER — Other Ambulatory Visit: Payer: Self-pay

## 2019-11-05 DIAGNOSIS — R600 Localized edema: Secondary | ICD-10-CM | POA: Diagnosis not present

## 2019-11-05 DIAGNOSIS — R609 Edema, unspecified: Secondary | ICD-10-CM | POA: Diagnosis not present

## 2019-11-07 LAB — ECHOCARDIOGRAM COMPLETE
Area-P 1/2: 4.15 cm2
P 1/2 time: 54 msec
S' Lateral: 3.5 cm

## 2019-11-18 ENCOUNTER — Other Ambulatory Visit: Payer: Self-pay | Admitting: Internal Medicine

## 2019-11-18 DIAGNOSIS — E1159 Type 2 diabetes mellitus with other circulatory complications: Secondary | ICD-10-CM

## 2019-12-02 ENCOUNTER — Other Ambulatory Visit: Payer: Self-pay | Admitting: Internal Medicine

## 2019-12-10 ENCOUNTER — Other Ambulatory Visit: Payer: Self-pay | Admitting: Internal Medicine

## 2019-12-14 ENCOUNTER — Other Ambulatory Visit: Payer: Self-pay | Admitting: Internal Medicine

## 2019-12-16 IMAGING — US ULTRASOUND LEFT BREAST LIMITED
1 series · 1 of 1 positions shown · non-contrast
Comparison: Previous exam(s).

CLINICAL DATA: 57-year-old female recalled from baseline screening
mammogram dated 09/19/2018 for a possible left breast asymmetry.

EXAM:
DIGITAL DIAGNOSTIC LEFT MAMMOGRAM WITH CAD AND TOMO
ULTRASOUND LEFT BREAST

[Series 1: ultrasound left breast limited · 0.06mm/px · 1 of 1 slices shown]
[im 1/1]
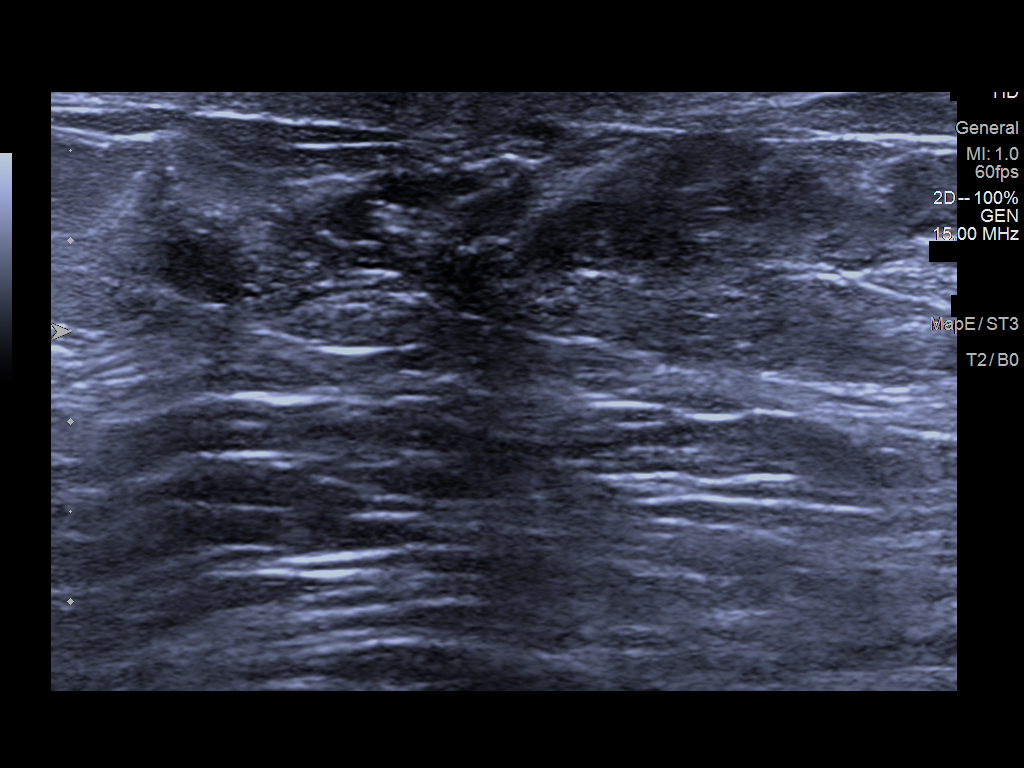

[1 of 1 positions shown; findings below may reference images not displayed]

ACR Breast Density Category b: There are scattered areas of
fibroglandular density.
FINDINGS: There is a persistent focal asymmetry with an appearance suggestive
of fibroglandular tissue with interspersed fat in the upper outer
subareolar left breast. Further evaluation with ultrasound was
performed.

Mammographic images were processed with CAD.

Targeted ultrasound is performed, showing islands of dense
fibroglandular tissue without focal or suspicious sonographic
abnormality. Evaluation of the entire subareolar left breast was
performed.
IMPRESSION: Probably benign left breast focal asymmetry, likely representing an
island of focally dense fibroglandular tissue. Recommendation is for
short-term imaging follow-up.

RECOMMENDATION:
Diagnostic left breast mammogram and possible ultrasound in 6
months.

I have discussed the findings and recommendations with the patient.
Results were also provided in writing at the conclusion of the
visit. If applicable, a reminder letter will be sent to the patient
regarding the next appointment.

BI-RADS CATEGORY  3: Probably benign.

## 2019-12-29 ENCOUNTER — Other Ambulatory Visit: Payer: Self-pay | Admitting: Internal Medicine

## 2019-12-29 DIAGNOSIS — E1159 Type 2 diabetes mellitus with other circulatory complications: Secondary | ICD-10-CM

## 2020-01-16 DIAGNOSIS — Z89421 Acquired absence of other right toe(s): Secondary | ICD-10-CM | POA: Diagnosis not present

## 2020-01-16 DIAGNOSIS — L97522 Non-pressure chronic ulcer of other part of left foot with fat layer exposed: Secondary | ICD-10-CM | POA: Diagnosis not present

## 2020-01-16 DIAGNOSIS — M2041 Other hammer toe(s) (acquired), right foot: Secondary | ICD-10-CM | POA: Diagnosis not present

## 2020-01-16 DIAGNOSIS — B351 Tinea unguium: Secondary | ICD-10-CM | POA: Diagnosis not present

## 2020-01-16 DIAGNOSIS — E1142 Type 2 diabetes mellitus with diabetic polyneuropathy: Secondary | ICD-10-CM | POA: Diagnosis not present

## 2020-01-18 ENCOUNTER — Other Ambulatory Visit: Payer: Self-pay | Admitting: Internal Medicine

## 2020-01-21 ENCOUNTER — Encounter: Payer: Self-pay | Admitting: Internal Medicine

## 2020-01-21 DIAGNOSIS — E113393 Type 2 diabetes mellitus with moderate nonproliferative diabetic retinopathy without macular edema, bilateral: Secondary | ICD-10-CM | POA: Diagnosis not present

## 2020-01-21 LAB — HM DIABETES EYE EXAM

## 2020-01-25 IMAGING — MR MRI OF THE RIGHT FOREFOOT WITHOUT AND WITH CONTRAST
9 series · 40 of 40 positions shown · IV contrast (9ml Gadavist)
Comparison: X-ray 10/16/2018

CLINICAL DATA: Right foot pain with plantar surface laceration.

EXAM:
MRI OF THE RIGHT FOREFOOT WITHOUT AND WITH CONTRAST
TECHNIQUE: Multiplanar, multisequence MR imaging of the right forefoot was
performed before and after the administration of intravenous
contrast.
CONTRAST:  9 mL Gadavist intravenous contrast

[Series 3: T1 · coronal · right · 3.0mm · 0.38mm/px · 5 of 45 slices shown (1 of 2)]
[im 1/45]
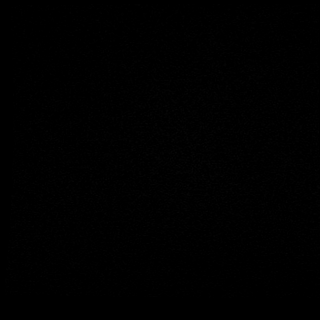
[im 12/45]
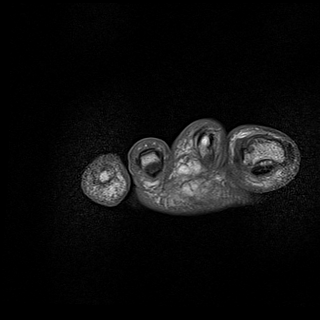
[im 23/45]
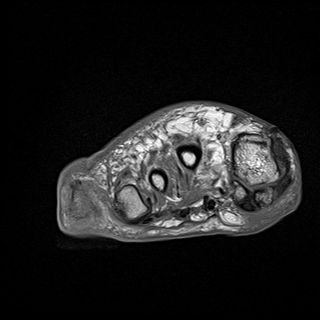
[im 34/45]
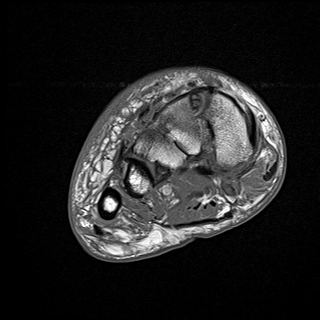
[im 45/45]
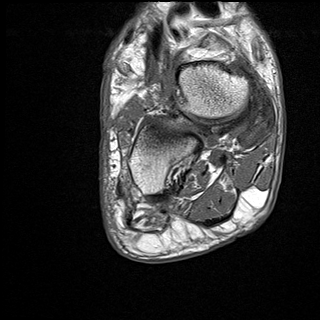

[Series 6: T2 · coronal · right · 3.0mm · 0.38mm/px · 6 of 45 slices shown (1 of 2)]
[im 1/45]
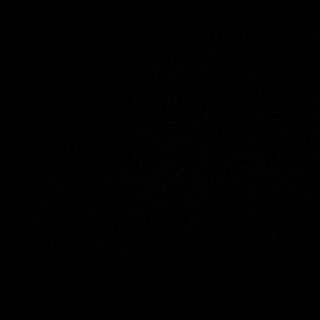
[im 9/45]
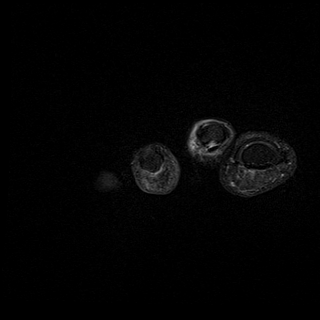
[im 18/45]
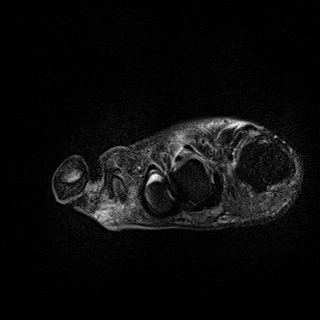
[im 27/45]
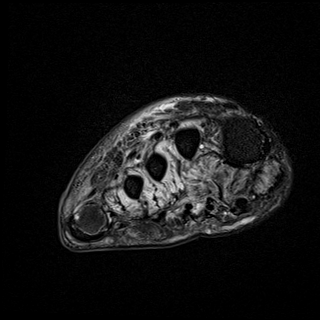
[im 36/45]
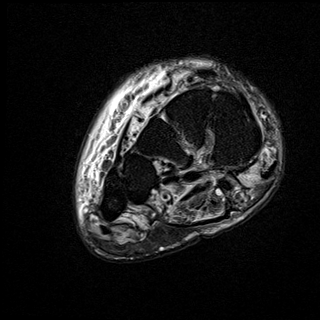
[im 45/45]
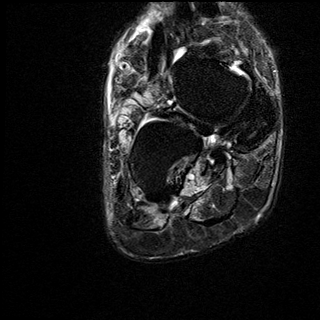

[Series 7: T1 · axial · right · 3.0mm · 0.70mm/px · z∈[-152,-89]mm · 3 of 20 slices shown (2 of 2)]
[im 1/20]
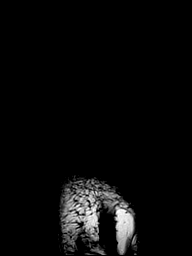
[im 10/20]
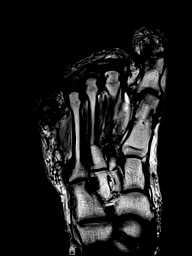
[im 20/20]
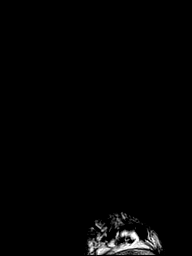

[Series 9: T2 · axial · right · 3.0mm · 0.70mm/px · z∈[-152,-89]mm · 3 of 20 slices shown (2 of 2)]
[im 1/20]
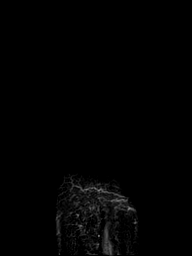
[im 10/20]
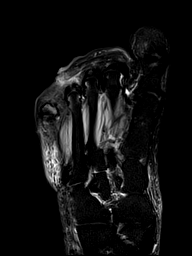
[im 20/20]
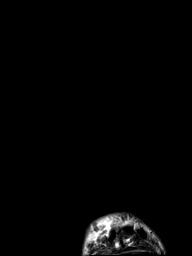

[Series 10: STIR · sagittal · right · 3.0mm · 0.62mm/px · 4 of 29 slices shown]
[im 1/29]
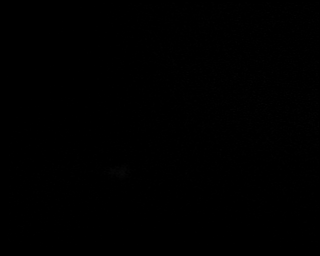
[im 10/29]
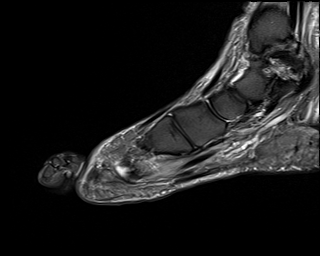
[im 19/29]
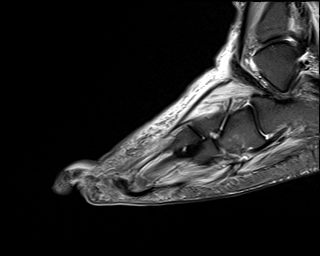
[im 29/29]
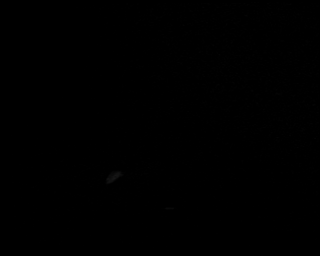

[Series 11: T1 fat-sat · coronal · non-contrast · right · 3.0mm · 0.47mm/px · 6 of 45 slices shown (1 of 3)]
[im 1/45]
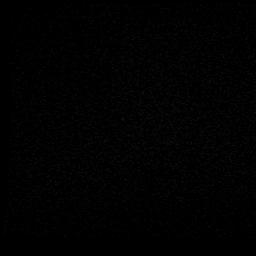
[im 9/45]
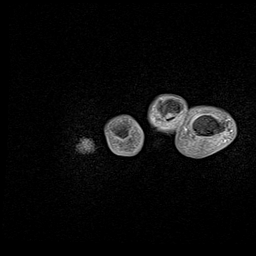
[im 18/45]
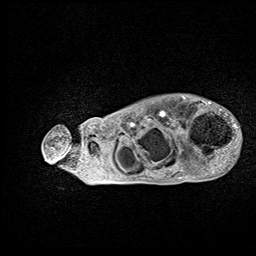
[im 27/45]
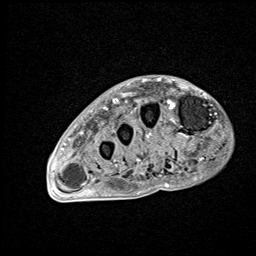
[im 36/45]
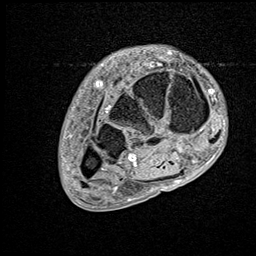
[im 45/45]
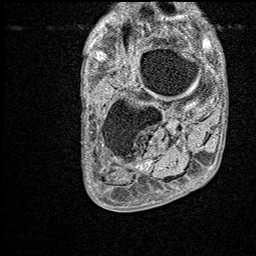

[Series 12: T1 fat-sat post-contrast · coronal · right · 3.0mm · 0.47mm/px · 6 of 45 slices shown]
[im 1/45]
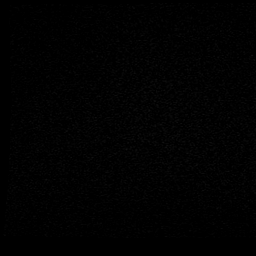
[im 9/45]
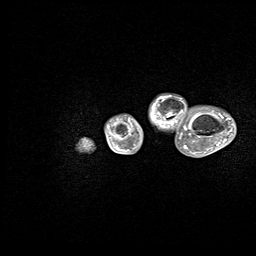
[im 18/45]
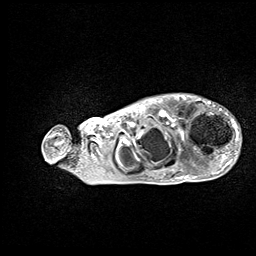
[im 27/45]
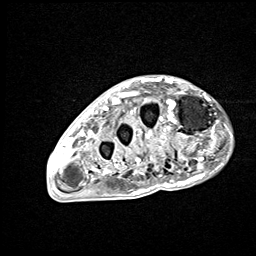
[im 36/45]
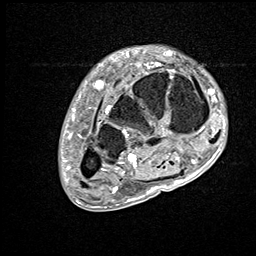
[im 45/45]
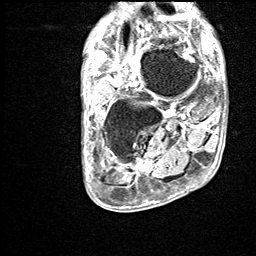

[Series 13: T1 fat-sat · sagittal · right · 3.0mm · 0.62mm/px · 4 of 29 slices shown (2 of 3)]
[im 1/29]
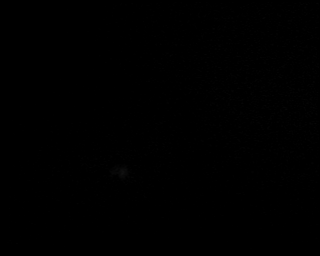
[im 10/29]
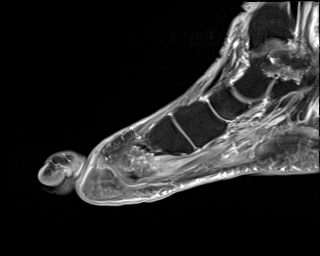
[im 19/29]
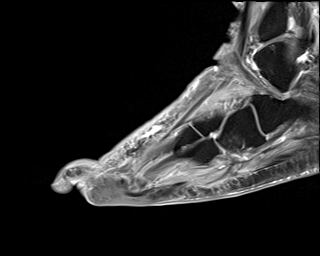
[im 29/29]
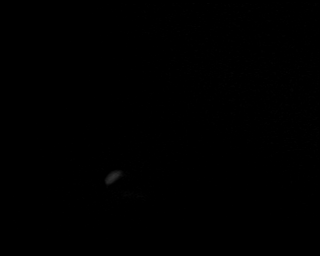

[Series 14: T1 fat-sat · axial · right · 3.0mm · 0.56mm/px · z∈[-152,-89]mm · 3 of 19 slices shown (3 of 3)]
[im 1/19]
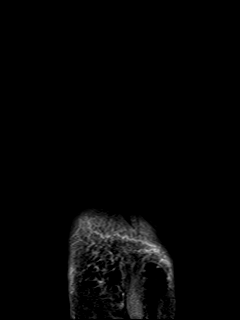
[im 10/19]
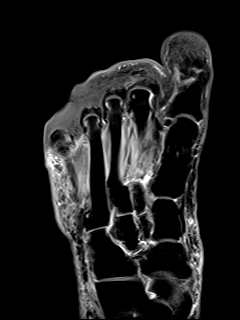
[im 19/19]
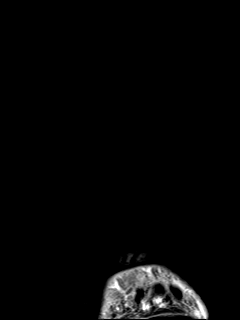

[40 of 40 positions shown; findings below may reference images not displayed]

FINDINGS: Bones/Joint/Cartilage

Confluent low T1 signal with associated T2 hyperintensity within the
fifth digit proximal phalanx (series [DATE], images 8-11). There is
marrow edema also present within the fifth digit distal phalanx base
and the plantar aspect of the fifth metatarsal head without
associated confluent low T1 marrow signal. Similar mild marrow edema
along the plantar aspect of the fourth metatarsal head without
corresponding T1 signal abnormality.

No acute fracture identified. Postoperative changes at the first
metatarsal from prior bunionectomy. No joint effusions. Mild first
MTP degenerative changes.

Ligaments

Ligaments including the Lisfranc ligament are intact.

Muscles and Tendons

There is extensive intramuscular throughout the intrinsic foot
musculature. Tendons appear intact without tenosynovitis.

Soft tissues

There is extensive subcutaneous edema centered at the level of the
fourth and fifth digits. Focal area of non enhancement within the
dorsal soft tissues dorsal to the fourth MTP joint may represent an
area of soft tissue necrosis (series 12, image 20). No well-defined
or rim enhancing fluid collections. No discrete foreign body
identified by MRI.
IMPRESSION: 1. Findings highly suspicious for osteomyelitis of the fifth toe
proximal phalanx.
2. Marrow edema is also present within the fifth toe distal phalanx
and plantar surfaces of the fourth and fifth metatarsal heads
without corresponding T1 signal abnormality to suggest acute
osteomyelitis. Findings may reflect reactive osteitis secondary to
associated soft tissue infection.
3. Findings suggestive of cellulitis centered at the fourth and
fifth digits with focal area of hypoenhancement within the dorsal
soft tissues at the level of the fourth MTP joint concerning for an
area of soft tissue necrosis. No well-defined or drainable fluid
collections.
4. Diffuse myositis.

These results will be called to the ordering clinician or
representative by the Radiologist Assistant, and communication
documented in the PACS or zVision Dashboard.

## 2020-01-28 ENCOUNTER — Encounter: Payer: Self-pay | Admitting: Internal Medicine

## 2020-02-11 DIAGNOSIS — E113513 Type 2 diabetes mellitus with proliferative diabetic retinopathy with macular edema, bilateral: Secondary | ICD-10-CM | POA: Diagnosis not present

## 2020-02-11 DIAGNOSIS — E1142 Type 2 diabetes mellitus with diabetic polyneuropathy: Secondary | ICD-10-CM | POA: Diagnosis not present

## 2020-02-11 DIAGNOSIS — I872 Venous insufficiency (chronic) (peripheral): Secondary | ICD-10-CM | POA: Diagnosis not present

## 2020-02-11 DIAGNOSIS — L84 Corns and callosities: Secondary | ICD-10-CM | POA: Diagnosis not present

## 2020-02-11 DIAGNOSIS — Z89421 Acquired absence of other right toe(s): Secondary | ICD-10-CM | POA: Diagnosis not present

## 2020-02-13 ENCOUNTER — Other Ambulatory Visit: Payer: Self-pay | Admitting: Internal Medicine

## 2020-02-27 ENCOUNTER — Other Ambulatory Visit: Payer: Self-pay | Admitting: Internal Medicine

## 2020-03-09 ENCOUNTER — Other Ambulatory Visit: Payer: Self-pay | Admitting: Internal Medicine

## 2020-03-20 ENCOUNTER — Telehealth: Payer: Self-pay

## 2020-03-20 NOTE — Telephone Encounter (Signed)
Received a fax from [facility] regarding forms that need to be completed by MD in order for patient to get a diabetic Rison. Patient will need appointment with Dr. Silvio Pate prior to forms being completed. Please schedule appointment.

## 2020-03-20 NOTE — Telephone Encounter (Signed)
I left a detailed message on patient's voice mail to call back and schedule appointment with Dr.Letvak.

## 2020-03-24 DIAGNOSIS — E113393 Type 2 diabetes mellitus with moderate nonproliferative diabetic retinopathy without macular edema, bilateral: Secondary | ICD-10-CM | POA: Diagnosis not present

## 2020-03-25 NOTE — Telephone Encounter (Signed)
I CALLED AND LEFT A DETAILED VM TO CALL BACK AND SCHEDULE AN APPOINTMENT WITH DR. Alphonsus Sias

## 2020-04-02 ENCOUNTER — Encounter: Payer: Self-pay | Admitting: Internal Medicine

## 2020-04-02 ENCOUNTER — Telehealth: Payer: Self-pay | Admitting: Internal Medicine

## 2020-04-02 MED ORDER — ATORVASTATIN CALCIUM 40 MG PO TABS: 40.0000 mg | ORAL_TABLET | Freq: Every day | ORAL | 0 refills | Status: DC

## 2020-04-02 NOTE — Telephone Encounter (Signed)
Sent a MyChart message to patient stating that she needed to call our office to schedule an appointment with Dr. Alphonsus Sias.

## 2020-04-02 NOTE — Telephone Encounter (Signed)
[facility] wants to know if we received the paperwork for the diabetic [employer] [employer] EM

## 2020-04-03 NOTE — Telephone Encounter (Signed)
We have received the paperwork, but patient needs to schedule an office visit with Dr. Alphonsus Sias prior to having the paperwork filled out. I have attempted multiple times to reach the patient (both by phone and MyChart) to get this appointment scheduled.

## 2020-04-08 ENCOUNTER — Other Ambulatory Visit: Payer: Self-pay

## 2020-04-08 ENCOUNTER — Encounter: Payer: Self-pay | Admitting: Internal Medicine

## 2020-04-08 ENCOUNTER — Ambulatory Visit: Payer: BC Managed Care – PPO | Admitting: Internal Medicine

## 2020-04-08 DIAGNOSIS — Z89421 Acquired absence of other right toe(s): Secondary | ICD-10-CM | POA: Insufficient documentation

## 2020-04-08 DIAGNOSIS — E0821 Diabetes mellitus due to underlying condition with diabetic nephropathy: Secondary | ICD-10-CM

## 2020-04-08 DIAGNOSIS — E119 Type 2 diabetes mellitus without complications: Secondary | ICD-10-CM | POA: Diagnosis not present

## 2020-04-08 NOTE — Progress Notes (Signed)
Subjective:    Patient ID: Rebecca Park, female    DOB: 1961/01/12, 60 y.o.   MRN: [id number]  HPI Here to review need for diabetic shoes This visit occurred during the SARS-CoV-2 public health emergency.  Safety protocols were in place, including screening questions prior to the visit, additional usage of staff PPE, and extensive cleaning of exam room while observing appropriate contact time as indicated for disinfecting solutions.   Diabetes diagnosed about 3 years ago Does see podiatrist--Dr Child psychotherapist  Has had past amputations---4th and 5th on the right Non healing lesions after injury in garden (puncture wound) No numbness, tingling or burning in feet No open areas now  Current Outpatient Medications on File Prior to Visit  Medication Sig Dispense Refill  . amLODipine (NORVASC) 5 MG tablet TAKE 1 TABLET BY MOUTH EVERY DAY 90 tablet 0  . atorvastatin (LIPITOR) 40 MG tablet Take 1 tablet (40 mg total) by mouth daily. 90 tablet 0  . glipiZIDE (GLUCOTROL) 10 MG tablet TAKE 1 TABLET (10 MG TOTAL) BY MOUTH 2 (TWO) TIMES DAILY BEFORE A MEAL. 180 tablet 1  . Lancets 30G MISC 1 each by Does not apply route 2 (two) times daily. 200 each 1  . losartan (COZAAR) 50 MG tablet TAKE 2 TABLETS BY MOUTH EVERY DAY 180 tablet 0  . Multiple Vitamins-Minerals (WOMENS MULTIVITAMIN PO) Take 1 tablet by mouth daily.     Glory Rosebush ULTRA test strip 1 EACH BY OTHER ROUTE 2 (TWO) TIMES DAILY. 200 strip 1  . torsemide (DEMADEX) 20 MG tablet TAKE 1 TABLET BY MOUTH EVERY DAY 90 tablet 0   No current facility-administered medications on file prior to visit.    No Known Allergies  Past Medical History:  Diagnosis Date  . Diabetes mellitus without complication ([place])   . Hypertension   . PONV (postoperative nausea and vomiting)    the day after rt 5th toe amputation surgery    Past Surgical History:  Procedure Laterality Date  . AMPUTATION Right 11/10/2018   Procedure: AMPUTATION RAY (amputation 5th  metatarsal Right foot;  Surgeon: Caroline More, DPM;  Location: [facility];  Service: Podiatry;  Laterality: Right;  . AMPUTATION TOE Right 12/05/2018   Procedure: RAY RIGHT 4TH AMPUTATION;  Surgeon: Caroline More, DPM;  Location: [facility];  Service: Podiatry;  Laterality: Right;  mac with local DIABETIC-oral meds  . BONE BIOPSY Right 11/10/2018   Procedure: BONE BIOPSY- Right 4th toe;  Surgeon: Caroline More, DPM;  Location: [facility];  Service: Podiatry;  Laterality: Right;  . BUNIONECTOMY Bilateral   . COLONOSCOPY WITH PROPOFOL N/A 09/07/2018   Procedure: COLONOSCOPY WITH PROPOFOL;  Surgeon: Lin Landsman, MD;  Location: [facility];  Service: Gastroenterology;  Laterality: N/A;  . COLONOSCOPY WITH PROPOFOL N/A 09/17/2019   Procedure: COLONOSCOPY WITH PROPOFOL;  Surgeon: Lin Landsman, MD;  Location: [facility];  Service: Gastroenterology;  Laterality: N/A;    Family History  Problem Relation Age of Onset  . Heart disease Father   . Heart attack Father   . Heart disease Maternal Grandfather     Social History   Socioeconomic History  . Marital status: Married    Spouse name: Not on file  . Number of children: Not on file  . Years of education: Not on file  . Highest education level: Not on file  Occupational History  . Not on file  Tobacco Use  . Smoking status: Never Smoker  . Smokeless tobacco: Never Used  Vaping Use  . Vaping Use: Never used  Substance and Sexual Activity  . Alcohol use: Not Currently    Comment: recently quit 01/2018, 3-4 beers QD  . Drug use: Never  . Sexual activity: Yes  Other Topics Concern  . Not on file  Social History Narrative  . Not on file   Social Determinants of Health   Financial Resource Strain: Not on file  Food Insecurity: Not on file  Transportation Needs: Not on file  Physical Activity: Not on file  Stress: Not on file  Social Connections: Not on file  Intimate Partner Violence: Not on file   Review  of Systems  Weight up since the amputations-- in August 2020 No foot pain     Objective:   Physical Exam Cardiovascular:     Pulses: Normal pulses.  Musculoskeletal:     Comments: Mild swelling in feet---right more than left Hammertoe--right 2nd preulcerative callous in both feet (plantar) Amputation sites on right foot--clean and dry            Assessment & Plan:

## 2020-04-08 NOTE — Assessment & Plan Note (Addendum)
Lab Results  Component Value Date   HGBA1C 7.5 (H) 10/24/2019   Doing okay on glipizide Wants to consider ozempic or other Rx

## 2020-04-08 NOTE — Assessment & Plan Note (Signed)
Has well healed amputation site Deformities and pre ulcerative callous Goes back to podiatrist for care next week Forms done for diabetic shoes

## 2020-05-05 DIAGNOSIS — E113513 Type 2 diabetes mellitus with proliferative diabetic retinopathy with macular edema, bilateral: Secondary | ICD-10-CM | POA: Diagnosis not present

## 2020-05-06 ENCOUNTER — Other Ambulatory Visit: Payer: Self-pay | Admitting: Internal Medicine

## 2020-05-07 DIAGNOSIS — L603 Nail dystrophy: Secondary | ICD-10-CM | POA: Diagnosis not present

## 2020-05-07 DIAGNOSIS — L851 Acquired keratosis [keratoderma] palmaris et plantaris: Secondary | ICD-10-CM | POA: Diagnosis not present

## 2020-05-07 DIAGNOSIS — E1142 Type 2 diabetes mellitus with diabetic polyneuropathy: Secondary | ICD-10-CM | POA: Diagnosis not present

## 2020-06-08 ENCOUNTER — Other Ambulatory Visit: Payer: Self-pay | Admitting: Internal Medicine

## 2020-06-09 ENCOUNTER — Other Ambulatory Visit: Payer: Self-pay | Admitting: Internal Medicine

## 2020-06-27 ENCOUNTER — Other Ambulatory Visit: Payer: Self-pay | Admitting: Internal Medicine

## 2020-06-27 DIAGNOSIS — E1159 Type 2 diabetes mellitus with other circulatory complications: Secondary | ICD-10-CM

## 2020-06-30 DIAGNOSIS — E113513 Type 2 diabetes mellitus with proliferative diabetic retinopathy with macular edema, bilateral: Secondary | ICD-10-CM | POA: Diagnosis not present

## 2020-07-01 ENCOUNTER — Other Ambulatory Visit: Payer: Self-pay | Admitting: Internal Medicine

## 2020-07-24 ENCOUNTER — Encounter: Payer: BC Managed Care – PPO | Admitting: Internal Medicine

## 2020-07-24 DIAGNOSIS — E782 Mixed hyperlipidemia: Secondary | ICD-10-CM | POA: Diagnosis not present

## 2020-07-24 DIAGNOSIS — E11319 Type 2 diabetes mellitus with unspecified diabetic retinopathy without macular edema: Secondary | ICD-10-CM | POA: Diagnosis not present

## 2020-07-24 DIAGNOSIS — Z0000 Encounter for general adult medical examination without abnormal findings: Secondary | ICD-10-CM | POA: Diagnosis not present

## 2020-07-24 DIAGNOSIS — I10 Essential (primary) hypertension: Secondary | ICD-10-CM | POA: Diagnosis not present

## 2020-07-31 ENCOUNTER — Other Ambulatory Visit: Payer: Self-pay | Admitting: Physician Assistant

## 2020-07-31 DIAGNOSIS — N6489 Other specified disorders of breast: Secondary | ICD-10-CM

## 2020-08-01 ENCOUNTER — Other Ambulatory Visit: Payer: Self-pay | Admitting: Internal Medicine

## 2020-08-06 DIAGNOSIS — L603 Nail dystrophy: Secondary | ICD-10-CM | POA: Diagnosis not present

## 2020-08-06 DIAGNOSIS — E1142 Type 2 diabetes mellitus with diabetic polyneuropathy: Secondary | ICD-10-CM | POA: Diagnosis not present

## 2020-08-06 DIAGNOSIS — L851 Acquired keratosis [keratoderma] palmaris et plantaris: Secondary | ICD-10-CM | POA: Diagnosis not present

## 2020-08-22 ENCOUNTER — Other Ambulatory Visit: Payer: Self-pay | Admitting: Internal Medicine

## 2020-08-22 NOTE — Telephone Encounter (Signed)
Patient established at [facility], will refuse this request.

## 2020-09-12 ENCOUNTER — Other Ambulatory Visit: Payer: Self-pay | Admitting: Internal Medicine

## 2020-09-22 ENCOUNTER — Other Ambulatory Visit: Payer: Self-pay

## 2020-09-22 ENCOUNTER — Ambulatory Visit
Admission: RE | Admit: 2020-09-22 | Discharge: 2020-09-22 | Disposition: A | Discharge status: Home / Self Care | Payer: BC Managed Care – PPO | Source: Ambulatory Visit | Attending: Physician Assistant | Admitting: Physician Assistant

## 2020-09-22 DIAGNOSIS — N6489 Other specified disorders of breast: Secondary | ICD-10-CM | POA: Insufficient documentation

## 2020-09-22 DIAGNOSIS — R922 Inconclusive mammogram: Secondary | ICD-10-CM | POA: Diagnosis not present

## 2020-10-12 ENCOUNTER — Other Ambulatory Visit: Payer: Self-pay | Admitting: Internal Medicine

## 2020-10-27 DIAGNOSIS — E11319 Type 2 diabetes mellitus with unspecified diabetic retinopathy without macular edema: Secondary | ICD-10-CM | POA: Diagnosis not present

## 2020-10-27 DIAGNOSIS — I10 Essential (primary) hypertension: Secondary | ICD-10-CM | POA: Diagnosis not present

## 2020-10-27 DIAGNOSIS — E119 Type 2 diabetes mellitus without complications: Secondary | ICD-10-CM | POA: Diagnosis not present

## 2020-10-27 DIAGNOSIS — E782 Mixed hyperlipidemia: Secondary | ICD-10-CM | POA: Diagnosis not present

## 2020-10-28 DIAGNOSIS — E113513 Type 2 diabetes mellitus with proliferative diabetic retinopathy with macular edema, bilateral: Secondary | ICD-10-CM | POA: Diagnosis not present

## 2020-11-19 DIAGNOSIS — B351 Tinea unguium: Secondary | ICD-10-CM | POA: Diagnosis not present

## 2020-11-19 DIAGNOSIS — M2141 Flat foot [pes planus] (acquired), right foot: Secondary | ICD-10-CM | POA: Diagnosis not present

## 2020-11-19 DIAGNOSIS — L851 Acquired keratosis [keratoderma] palmaris et plantaris: Secondary | ICD-10-CM | POA: Diagnosis not present

## 2020-11-19 DIAGNOSIS — E1142 Type 2 diabetes mellitus with diabetic polyneuropathy: Secondary | ICD-10-CM | POA: Diagnosis not present

## 2020-11-19 DIAGNOSIS — M14672 Charcot's joint, left ankle and foot: Secondary | ICD-10-CM | POA: Diagnosis not present

## 2020-11-26 DIAGNOSIS — M14672 Charcot's joint, left ankle and foot: Secondary | ICD-10-CM | POA: Diagnosis not present

## 2020-11-26 DIAGNOSIS — E1142 Type 2 diabetes mellitus with diabetic polyneuropathy: Secondary | ICD-10-CM | POA: Diagnosis not present

## 2020-11-26 DIAGNOSIS — S90822A Blister (nonthermal), left foot, initial encounter: Secondary | ICD-10-CM | POA: Diagnosis not present

## 2020-11-26 DIAGNOSIS — M2141 Flat foot [pes planus] (acquired), right foot: Secondary | ICD-10-CM | POA: Diagnosis not present

## 2020-12-03 DIAGNOSIS — S90822D Blister (nonthermal), left foot, subsequent encounter: Secondary | ICD-10-CM | POA: Diagnosis not present

## 2020-12-03 DIAGNOSIS — Z89421 Acquired absence of other right toe(s): Secondary | ICD-10-CM | POA: Diagnosis not present

## 2020-12-03 DIAGNOSIS — E1142 Type 2 diabetes mellitus with diabetic polyneuropathy: Secondary | ICD-10-CM | POA: Diagnosis not present

## 2020-12-11 ENCOUNTER — Other Ambulatory Visit: Payer: Self-pay | Admitting: Family

## 2020-12-11 DIAGNOSIS — M14672 Charcot's joint, left ankle and foot: Secondary | ICD-10-CM | POA: Diagnosis not present

## 2020-12-11 DIAGNOSIS — E1142 Type 2 diabetes mellitus with diabetic polyneuropathy: Secondary | ICD-10-CM | POA: Diagnosis not present

## 2020-12-18 DIAGNOSIS — S90822D Blister (nonthermal), left foot, subsequent encounter: Secondary | ICD-10-CM | POA: Diagnosis not present

## 2020-12-18 DIAGNOSIS — M14672 Charcot's joint, left ankle and foot: Secondary | ICD-10-CM | POA: Diagnosis not present

## 2020-12-18 DIAGNOSIS — E1142 Type 2 diabetes mellitus with diabetic polyneuropathy: Secondary | ICD-10-CM | POA: Diagnosis not present

## 2020-12-21 ENCOUNTER — Other Ambulatory Visit: Payer: Self-pay | Admitting: Family

## 2020-12-21 ENCOUNTER — Other Ambulatory Visit: Payer: Self-pay | Admitting: Internal Medicine

## 2020-12-21 DIAGNOSIS — E1159 Type 2 diabetes mellitus with other circulatory complications: Secondary | ICD-10-CM

## 2020-12-22 NOTE — Telephone Encounter (Signed)
Pt no linger under prescribers care

## 2020-12-26 DIAGNOSIS — M14672 Charcot's joint, left ankle and foot: Secondary | ICD-10-CM | POA: Diagnosis not present

## 2020-12-26 DIAGNOSIS — E1142 Type 2 diabetes mellitus with diabetic polyneuropathy: Secondary | ICD-10-CM | POA: Diagnosis not present

## 2020-12-26 DIAGNOSIS — S90822D Blister (nonthermal), left foot, subsequent encounter: Secondary | ICD-10-CM | POA: Diagnosis not present

## 2020-12-26 DIAGNOSIS — E11622 Type 2 diabetes mellitus with other skin ulcer: Secondary | ICD-10-CM | POA: Diagnosis not present

## 2020-12-29 DIAGNOSIS — L97512 Non-pressure chronic ulcer of other part of right foot with fat layer exposed: Secondary | ICD-10-CM | POA: Diagnosis not present

## 2021-01-13 DIAGNOSIS — E11622 Type 2 diabetes mellitus with other skin ulcer: Secondary | ICD-10-CM | POA: Diagnosis not present

## 2021-01-13 DIAGNOSIS — Z89421 Acquired absence of other right toe(s): Secondary | ICD-10-CM | POA: Diagnosis not present

## 2021-01-13 DIAGNOSIS — E1142 Type 2 diabetes mellitus with diabetic polyneuropathy: Secondary | ICD-10-CM | POA: Diagnosis not present

## 2021-01-13 DIAGNOSIS — L97302 Non-pressure chronic ulcer of unspecified ankle with fat layer exposed: Secondary | ICD-10-CM | POA: Diagnosis not present

## 2021-01-14 ENCOUNTER — Other Ambulatory Visit: Payer: Self-pay | Admitting: Internal Medicine

## 2021-01-14 DIAGNOSIS — E1159 Type 2 diabetes mellitus with other circulatory complications: Secondary | ICD-10-CM

## 2021-01-15 NOTE — Telephone Encounter (Signed)
Requested Prescriptions  Refused Prescriptions Disp Refills  . glipiZIDE (GLUCOTROL) 10 MG tablet [Pharmacy Med Name: GLIPIZIDE 10 MG TABLET] 180 tablet 1    Sig: TAKE 1 TABLET (10 MG TOTAL) BY MOUTH 2 (TWO) TIMES DAILY BEFORE A MEAL.     There is no refill protocol information for this order

## 2021-01-20 DIAGNOSIS — L97302 Non-pressure chronic ulcer of unspecified ankle with fat layer exposed: Secondary | ICD-10-CM | POA: Diagnosis not present

## 2021-01-28 DIAGNOSIS — E1142 Type 2 diabetes mellitus with diabetic polyneuropathy: Secondary | ICD-10-CM | POA: Diagnosis not present

## 2021-01-28 DIAGNOSIS — L97302 Non-pressure chronic ulcer of unspecified ankle with fat layer exposed: Secondary | ICD-10-CM | POA: Diagnosis not present

## 2021-01-28 DIAGNOSIS — E11622 Type 2 diabetes mellitus with other skin ulcer: Secondary | ICD-10-CM | POA: Diagnosis not present

## 2021-01-28 DIAGNOSIS — Z89421 Acquired absence of other right toe(s): Secondary | ICD-10-CM | POA: Diagnosis not present

## 2021-02-11 DIAGNOSIS — L97322 Non-pressure chronic ulcer of left ankle with fat layer exposed: Secondary | ICD-10-CM | POA: Diagnosis not present

## 2021-02-11 DIAGNOSIS — I872 Venous insufficiency (chronic) (peripheral): Secondary | ICD-10-CM | POA: Diagnosis not present

## 2021-02-11 DIAGNOSIS — E11622 Type 2 diabetes mellitus with other skin ulcer: Secondary | ICD-10-CM | POA: Diagnosis not present

## 2021-02-11 DIAGNOSIS — E1142 Type 2 diabetes mellitus with diabetic polyneuropathy: Secondary | ICD-10-CM | POA: Diagnosis not present

## 2021-02-23 DIAGNOSIS — E1142 Type 2 diabetes mellitus with diabetic polyneuropathy: Secondary | ICD-10-CM | POA: Diagnosis not present

## 2021-02-23 DIAGNOSIS — E11622 Type 2 diabetes mellitus with other skin ulcer: Secondary | ICD-10-CM | POA: Diagnosis not present

## 2021-02-23 DIAGNOSIS — M14672 Charcot's joint, left ankle and foot: Secondary | ICD-10-CM | POA: Diagnosis not present

## 2021-02-23 DIAGNOSIS — I872 Venous insufficiency (chronic) (peripheral): Secondary | ICD-10-CM | POA: Diagnosis not present

## 2021-03-02 DIAGNOSIS — E1142 Type 2 diabetes mellitus with diabetic polyneuropathy: Secondary | ICD-10-CM | POA: Diagnosis not present

## 2021-03-02 DIAGNOSIS — E11622 Type 2 diabetes mellitus with other skin ulcer: Secondary | ICD-10-CM | POA: Diagnosis not present

## 2021-03-02 DIAGNOSIS — L97302 Non-pressure chronic ulcer of unspecified ankle with fat layer exposed: Secondary | ICD-10-CM | POA: Diagnosis not present

## 2021-03-13 ENCOUNTER — Inpatient Hospital Stay
Admission: EM | Admit: 2021-03-13 | Discharge: 2021-03-27 | DRG: 854 | Disposition: A | Discharge status: Skilled Nursing Facility | Payer: BC Managed Care – PPO | Attending: Internal Medicine | Admitting: Internal Medicine

## 2021-03-13 ENCOUNTER — Emergency Department: Payer: BC Managed Care – PPO

## 2021-03-13 DIAGNOSIS — E11621 Type 2 diabetes mellitus with foot ulcer: Secondary | ICD-10-CM | POA: Diagnosis present

## 2021-03-13 DIAGNOSIS — R112 Nausea with vomiting, unspecified: Secondary | ICD-10-CM

## 2021-03-13 DIAGNOSIS — E11628 Type 2 diabetes mellitus with other skin complications: Secondary | ICD-10-CM | POA: Diagnosis present

## 2021-03-13 DIAGNOSIS — M65872 Other synovitis and tenosynovitis, left ankle and foot: Secondary | ICD-10-CM | POA: Diagnosis not present

## 2021-03-13 DIAGNOSIS — Z79899 Other long term (current) drug therapy: Secondary | ICD-10-CM

## 2021-03-13 DIAGNOSIS — A4101 Sepsis due to Methicillin susceptible Staphylococcus aureus: Secondary | ICD-10-CM | POA: Diagnosis not present

## 2021-03-13 DIAGNOSIS — M14672 Charcot's joint, left ankle and foot: Secondary | ICD-10-CM | POA: Diagnosis not present

## 2021-03-13 DIAGNOSIS — E1169 Type 2 diabetes mellitus with other specified complication: Secondary | ICD-10-CM | POA: Diagnosis present

## 2021-03-13 DIAGNOSIS — E119 Type 2 diabetes mellitus without complications: Secondary | ICD-10-CM

## 2021-03-13 DIAGNOSIS — E1140 Type 2 diabetes mellitus with diabetic neuropathy, unspecified: Secondary | ICD-10-CM | POA: Diagnosis not present

## 2021-03-13 DIAGNOSIS — M009 Pyogenic arthritis, unspecified: Secondary | ICD-10-CM | POA: Diagnosis not present

## 2021-03-13 DIAGNOSIS — R6520 Severe sepsis without septic shock: Secondary | ICD-10-CM | POA: Diagnosis not present

## 2021-03-13 DIAGNOSIS — Z89421 Acquired absence of other right toe(s): Secondary | ICD-10-CM

## 2021-03-13 DIAGNOSIS — L97529 Non-pressure chronic ulcer of other part of left foot with unspecified severity: Secondary | ICD-10-CM | POA: Diagnosis present

## 2021-03-13 DIAGNOSIS — I340 Nonrheumatic mitral (valve) insufficiency: Secondary | ICD-10-CM | POA: Diagnosis not present

## 2021-03-13 DIAGNOSIS — R000 Tachycardia, unspecified: Secondary | ICD-10-CM | POA: Diagnosis not present

## 2021-03-13 DIAGNOSIS — R2689 Other abnormalities of gait and mobility: Secondary | ICD-10-CM | POA: Diagnosis not present

## 2021-03-13 DIAGNOSIS — I10 Essential (primary) hypertension: Secondary | ICD-10-CM | POA: Diagnosis present

## 2021-03-13 DIAGNOSIS — S92102A Unspecified fracture of left talus, initial encounter for closed fracture: Secondary | ICD-10-CM | POA: Diagnosis not present

## 2021-03-13 DIAGNOSIS — L0390 Cellulitis, unspecified: Secondary | ICD-10-CM | POA: Diagnosis not present

## 2021-03-13 DIAGNOSIS — E0821 Diabetes mellitus due to underlying condition with diabetic nephropathy: Secondary | ICD-10-CM | POA: Diagnosis not present

## 2021-03-13 DIAGNOSIS — E1121 Type 2 diabetes mellitus with diabetic nephropathy: Secondary | ICD-10-CM | POA: Diagnosis not present

## 2021-03-13 DIAGNOSIS — M00872 Arthritis due to other bacteria, left ankle and foot: Secondary | ICD-10-CM | POA: Diagnosis not present

## 2021-03-13 DIAGNOSIS — E872 Acidosis, unspecified: Secondary | ICD-10-CM | POA: Diagnosis present

## 2021-03-13 DIAGNOSIS — M869 Osteomyelitis, unspecified: Secondary | ICD-10-CM | POA: Diagnosis present

## 2021-03-13 DIAGNOSIS — B9561 Methicillin susceptible Staphylococcus aureus infection as the cause of diseases classified elsewhere: Secondary | ICD-10-CM | POA: Diagnosis not present

## 2021-03-13 DIAGNOSIS — R739 Hyperglycemia, unspecified: Secondary | ICD-10-CM | POA: Diagnosis not present

## 2021-03-13 DIAGNOSIS — L089 Local infection of the skin and subcutaneous tissue, unspecified: Secondary | ICD-10-CM | POA: Diagnosis not present

## 2021-03-13 DIAGNOSIS — E871 Hypo-osmolality and hyponatremia: Secondary | ICD-10-CM | POA: Diagnosis present

## 2021-03-13 DIAGNOSIS — L97329 Non-pressure chronic ulcer of left ankle with unspecified severity: Secondary | ICD-10-CM | POA: Diagnosis present

## 2021-03-13 DIAGNOSIS — Z23 Encounter for immunization: Secondary | ICD-10-CM | POA: Diagnosis not present

## 2021-03-13 DIAGNOSIS — I959 Hypotension, unspecified: Secondary | ICD-10-CM | POA: Diagnosis not present

## 2021-03-13 DIAGNOSIS — M14679 Charcot's joint, unspecified ankle and foot: Secondary | ICD-10-CM | POA: Diagnosis not present

## 2021-03-13 DIAGNOSIS — D6489 Other specified anemias: Secondary | ICD-10-CM | POA: Diagnosis present

## 2021-03-13 DIAGNOSIS — E1142 Type 2 diabetes mellitus with diabetic polyneuropathy: Secondary | ICD-10-CM | POA: Diagnosis not present

## 2021-03-13 DIAGNOSIS — E782 Mixed hyperlipidemia: Secondary | ICD-10-CM | POA: Diagnosis not present

## 2021-03-13 DIAGNOSIS — Z20822 Contact with and (suspected) exposure to covid-19: Secondary | ICD-10-CM | POA: Diagnosis present

## 2021-03-13 DIAGNOSIS — E1152 Type 2 diabetes mellitus with diabetic peripheral angiopathy with gangrene: Secondary | ICD-10-CM | POA: Diagnosis not present

## 2021-03-13 DIAGNOSIS — M6281 Muscle weakness (generalized): Secondary | ICD-10-CM | POA: Diagnosis not present

## 2021-03-13 DIAGNOSIS — Z7984 Long term (current) use of oral hypoglycemic drugs: Secondary | ICD-10-CM

## 2021-03-13 DIAGNOSIS — M86172 Other acute osteomyelitis, left ankle and foot: Secondary | ICD-10-CM | POA: Diagnosis not present

## 2021-03-13 DIAGNOSIS — E878 Other disorders of electrolyte and fluid balance, not elsewhere classified: Secondary | ICD-10-CM | POA: Diagnosis present

## 2021-03-13 DIAGNOSIS — A419 Sepsis, unspecified organism: Secondary | ICD-10-CM | POA: Diagnosis not present

## 2021-03-13 DIAGNOSIS — N179 Acute kidney failure, unspecified: Secondary | ICD-10-CM | POA: Diagnosis present

## 2021-03-13 DIAGNOSIS — E785 Hyperlipidemia, unspecified: Secondary | ICD-10-CM | POA: Diagnosis present

## 2021-03-13 DIAGNOSIS — L03116 Cellulitis of left lower limb: Secondary | ICD-10-CM | POA: Diagnosis present

## 2021-03-13 DIAGNOSIS — B999 Unspecified infectious disease: Secondary | ICD-10-CM | POA: Diagnosis not present

## 2021-03-13 DIAGNOSIS — J9811 Atelectasis: Secondary | ICD-10-CM | POA: Diagnosis not present

## 2021-03-13 DIAGNOSIS — M7989 Other specified soft tissue disorders: Secondary | ICD-10-CM | POA: Diagnosis not present

## 2021-03-13 DIAGNOSIS — M86171 Other acute osteomyelitis, right ankle and foot: Secondary | ICD-10-CM | POA: Diagnosis not present

## 2021-03-13 DIAGNOSIS — M79673 Pain in unspecified foot: Secondary | ICD-10-CM | POA: Diagnosis not present

## 2021-03-13 DIAGNOSIS — Z8249 Family history of ischemic heart disease and other diseases of the circulatory system: Secondary | ICD-10-CM

## 2021-03-13 DIAGNOSIS — I361 Nonrheumatic tricuspid (valve) insufficiency: Secondary | ICD-10-CM | POA: Diagnosis not present

## 2021-03-13 DIAGNOSIS — R29898 Other symptoms and signs involving the musculoskeletal system: Secondary | ICD-10-CM | POA: Diagnosis not present

## 2021-03-13 DIAGNOSIS — R0902 Hypoxemia: Secondary | ICD-10-CM | POA: Diagnosis not present

## 2021-03-13 DIAGNOSIS — I700 Atherosclerosis of aorta: Secondary | ICD-10-CM | POA: Diagnosis not present

## 2021-03-13 DIAGNOSIS — E1165 Type 2 diabetes mellitus with hyperglycemia: Secondary | ICD-10-CM | POA: Diagnosis not present

## 2021-03-13 DIAGNOSIS — Z794 Long term (current) use of insulin: Secondary | ICD-10-CM

## 2021-03-13 DIAGNOSIS — E11610 Type 2 diabetes mellitus with diabetic neuropathic arthropathy: Secondary | ICD-10-CM | POA: Diagnosis present

## 2021-03-13 DIAGNOSIS — Z7401 Bed confinement status: Secondary | ICD-10-CM | POA: Diagnosis not present

## 2021-03-13 DIAGNOSIS — Z4781 Encounter for orthopedic aftercare following surgical amputation: Secondary | ICD-10-CM | POA: Diagnosis not present

## 2021-03-13 DIAGNOSIS — R7881 Bacteremia: Secondary | ICD-10-CM | POA: Diagnosis not present

## 2021-03-13 MED ORDER — LACTATED RINGERS IV BOLUS
1000.0000 mL | Freq: Once | INTRAVENOUS | Status: AC
  Administered 2021-03-14: 1000 mL via INTRAVENOUS

## 2021-03-13 NOTE — ED Triage Notes (Signed)
From home via EMS for nausea and vomiting, elevated blood glucose; A/O x 4; skin warm/ dry; respiration even non-laboref

## 2021-03-14 ENCOUNTER — Inpatient Hospital Stay: Payer: BC Managed Care – PPO

## 2021-03-14 ENCOUNTER — Other Ambulatory Visit: Payer: Self-pay

## 2021-03-14 ENCOUNTER — Encounter: Payer: Self-pay | Admitting: Family Medicine

## 2021-03-14 DIAGNOSIS — M86171 Other acute osteomyelitis, right ankle and foot: Secondary | ICD-10-CM | POA: Diagnosis not present

## 2021-03-14 DIAGNOSIS — I700 Atherosclerosis of aorta: Secondary | ICD-10-CM | POA: Diagnosis present

## 2021-03-14 DIAGNOSIS — I340 Nonrheumatic mitral (valve) insufficiency: Secondary | ICD-10-CM | POA: Diagnosis not present

## 2021-03-14 DIAGNOSIS — E878 Other disorders of electrolyte and fluid balance, not elsewhere classified: Secondary | ICD-10-CM | POA: Diagnosis present

## 2021-03-14 DIAGNOSIS — E1140 Type 2 diabetes mellitus with diabetic neuropathy, unspecified: Secondary | ICD-10-CM | POA: Diagnosis present

## 2021-03-14 DIAGNOSIS — E11610 Type 2 diabetes mellitus with diabetic neuropathic arthropathy: Secondary | ICD-10-CM | POA: Diagnosis present

## 2021-03-14 DIAGNOSIS — E1169 Type 2 diabetes mellitus with other specified complication: Secondary | ICD-10-CM

## 2021-03-14 DIAGNOSIS — E1152 Type 2 diabetes mellitus with diabetic peripheral angiopathy with gangrene: Secondary | ICD-10-CM | POA: Diagnosis present

## 2021-03-14 DIAGNOSIS — L03116 Cellulitis of left lower limb: Secondary | ICD-10-CM | POA: Diagnosis present

## 2021-03-14 DIAGNOSIS — B9561 Methicillin susceptible Staphylococcus aureus infection as the cause of diseases classified elsewhere: Secondary | ICD-10-CM

## 2021-03-14 DIAGNOSIS — M869 Osteomyelitis, unspecified: Secondary | ICD-10-CM | POA: Diagnosis present

## 2021-03-14 DIAGNOSIS — N179 Acute kidney failure, unspecified: Secondary | ICD-10-CM | POA: Diagnosis present

## 2021-03-14 DIAGNOSIS — A4101 Sepsis due to Methicillin susceptible Staphylococcus aureus: Secondary | ICD-10-CM | POA: Diagnosis present

## 2021-03-14 DIAGNOSIS — E11621 Type 2 diabetes mellitus with foot ulcer: Secondary | ICD-10-CM | POA: Diagnosis present

## 2021-03-14 DIAGNOSIS — E1165 Type 2 diabetes mellitus with hyperglycemia: Secondary | ICD-10-CM | POA: Diagnosis present

## 2021-03-14 DIAGNOSIS — D6489 Other specified anemias: Secondary | ICD-10-CM | POA: Diagnosis present

## 2021-03-14 DIAGNOSIS — I10 Essential (primary) hypertension: Secondary | ICD-10-CM | POA: Diagnosis present

## 2021-03-14 DIAGNOSIS — I361 Nonrheumatic tricuspid (valve) insufficiency: Secondary | ICD-10-CM | POA: Diagnosis not present

## 2021-03-14 DIAGNOSIS — E782 Mixed hyperlipidemia: Secondary | ICD-10-CM | POA: Diagnosis not present

## 2021-03-14 DIAGNOSIS — E1121 Type 2 diabetes mellitus with diabetic nephropathy: Secondary | ICD-10-CM | POA: Diagnosis not present

## 2021-03-14 DIAGNOSIS — L97329 Non-pressure chronic ulcer of left ankle with unspecified severity: Secondary | ICD-10-CM | POA: Diagnosis present

## 2021-03-14 DIAGNOSIS — R7881 Bacteremia: Secondary | ICD-10-CM

## 2021-03-14 DIAGNOSIS — E872 Acidosis, unspecified: Secondary | ICD-10-CM | POA: Diagnosis present

## 2021-03-14 DIAGNOSIS — L0390 Cellulitis, unspecified: Secondary | ICD-10-CM

## 2021-03-14 DIAGNOSIS — L089 Local infection of the skin and subcutaneous tissue, unspecified: Secondary | ICD-10-CM | POA: Diagnosis not present

## 2021-03-14 DIAGNOSIS — E11628 Type 2 diabetes mellitus with other skin complications: Secondary | ICD-10-CM | POA: Diagnosis present

## 2021-03-14 DIAGNOSIS — M009 Pyogenic arthritis, unspecified: Secondary | ICD-10-CM | POA: Diagnosis present

## 2021-03-14 DIAGNOSIS — E871 Hypo-osmolality and hyponatremia: Secondary | ICD-10-CM | POA: Diagnosis present

## 2021-03-14 DIAGNOSIS — Z7984 Long term (current) use of oral hypoglycemic drugs: Secondary | ICD-10-CM | POA: Diagnosis not present

## 2021-03-14 DIAGNOSIS — Z23 Encounter for immunization: Secondary | ICD-10-CM | POA: Diagnosis not present

## 2021-03-14 DIAGNOSIS — E785 Hyperlipidemia, unspecified: Secondary | ICD-10-CM | POA: Diagnosis present

## 2021-03-14 DIAGNOSIS — A419 Sepsis, unspecified organism: Secondary | ICD-10-CM

## 2021-03-14 DIAGNOSIS — Z20822 Contact with and (suspected) exposure to covid-19: Secondary | ICD-10-CM | POA: Diagnosis present

## 2021-03-14 DIAGNOSIS — E0821 Diabetes mellitus due to underlying condition with diabetic nephropathy: Secondary | ICD-10-CM | POA: Diagnosis not present

## 2021-03-14 DIAGNOSIS — R6520 Severe sepsis without septic shock: Secondary | ICD-10-CM | POA: Diagnosis present

## 2021-03-14 LAB — URINALYSIS, ROUTINE W REFLEX MICROSCOPIC
Bilirubin Urine: NEGATIVE
Glucose, UA: >=500 mg/dL — AB
Ketones, ur: 5 mg/dL — AB
Nitrite: NEGATIVE
Protein, ur: 30 mg/dL — AB
Specific Gravity, Urine: 1.022 (ref 1.005–1.030)
pH: 5 (ref 5.0–8.0)

## 2021-03-14 LAB — BLOOD CULTURE ID PANEL (REFLEXED) - BCID2

## 2021-03-14 LAB — CBC WITH DIFFERENTIAL/PLATELET
Abs Immature Granulocytes: 0.12 10*3/uL — ABNORMAL HIGH (ref 0.00–0.07)
Basophils Absolute: 0 10*3/uL (ref 0.0–0.1)
Basophils Relative: 0 %
Eosinophils Absolute: 0 10*3/uL (ref 0.0–0.5)
Eosinophils Relative: 0 %
HCT: 32.1 % — ABNORMAL LOW (ref 36.0–46.0)
Hemoglobin: 10.5 g/dL — ABNORMAL LOW (ref 12.0–15.0)
Immature Granulocytes: 1 %
Lymphocytes Relative: 2 %
Lymphs Abs: 0.3 10*3/uL — ABNORMAL LOW (ref 0.7–4.0)
MCH: 27.5 pg (ref 26.0–34.0)
MCHC: 32.7 g/dL (ref 30.0–36.0)
MCV: 84 fL (ref 80.0–100.0)
Monocytes Absolute: 0.8 10*3/uL (ref 0.1–1.0)
Monocytes Relative: 4 %
Neutro Abs: 16.8 10*3/uL — ABNORMAL HIGH (ref 1.7–7.7)
Neutrophils Relative %: 93 %
Platelets: 177 10*3/uL (ref 150–400)
RBC: 3.82 MIL/uL — ABNORMAL LOW (ref 3.87–5.11)
RDW: 14.2 % (ref 11.5–15.5)
WBC: 18.1 10*3/uL — ABNORMAL HIGH (ref 4.0–10.5)
nRBC: 0 % (ref 0.0–0.2)

## 2021-03-14 LAB — CBG MONITORING, ED
Glucose-Capillary: 377 mg/dL — ABNORMAL HIGH (ref 70–99)
Glucose-Capillary: 325 mg/dL — ABNORMAL HIGH (ref 70–99)
Glucose-Capillary: 266 mg/dL — ABNORMAL HIGH (ref 70–99)

## 2021-03-14 LAB — COMPREHENSIVE METABOLIC PANEL
ALT: 23 U/L (ref 0–44)
AST: 25 U/L (ref 15–41)
Albumin: 2.7 g/dL — ABNORMAL LOW (ref 3.5–5.0)
Alkaline Phosphatase: 213 U/L — ABNORMAL HIGH (ref 38–126)
Anion gap: 12 (ref 5–15)
BUN: 44 mg/dL — ABNORMAL HIGH (ref 6–20)
CO2: 24 mmol/L (ref 22–32)
Calcium: 8.4 mg/dL — ABNORMAL LOW (ref 8.9–10.3)
Chloride: 92 mmol/L — ABNORMAL LOW (ref 98–111)
Creatinine, Ser: 2.15 mg/dL — ABNORMAL HIGH (ref 0.44–1.00)
GFR, Estimated: 26 mL/min — ABNORMAL LOW (ref >60)
Glucose, Bld: 390 mg/dL — ABNORMAL HIGH (ref 70–99)
Potassium: 3.8 mmol/L (ref 3.5–5.1)
Sodium: 128 mmol/L — ABNORMAL LOW (ref 135–145)
Total Bilirubin: 1.9 mg/dL — ABNORMAL HIGH (ref 0.3–1.2)
Total Protein: 6.8 g/dL (ref 6.5–8.1)

## 2021-03-14 LAB — CBC
HCT: 28.9 % — ABNORMAL LOW (ref 36.0–46.0)
Hemoglobin: 9.5 g/dL — ABNORMAL LOW (ref 12.0–15.0)
MCH: 27.7 pg (ref 26.0–34.0)
MCHC: 32.9 g/dL (ref 30.0–36.0)
MCV: 84.3 fL (ref 80.0–100.0)
Platelets: 154 10*3/uL (ref 150–400)
RBC: 3.43 MIL/uL — ABNORMAL LOW (ref 3.87–5.11)
RDW: 14.4 % (ref 11.5–15.5)
WBC: 17.7 10*3/uL — ABNORMAL HIGH (ref 4.0–10.5)
nRBC: 0 % (ref 0.0–0.2)

## 2021-03-14 LAB — RESP PANEL BY RT-PCR (FLU A&B, COVID) ARPGX2
Influenza A by PCR: NEGATIVE
Influenza B by PCR: NEGATIVE
SARS Coronavirus 2 by RT PCR: NEGATIVE

## 2021-03-14 LAB — PROCALCITONIN: Procalcitonin: 4.26 ng/mL

## 2021-03-14 LAB — BASIC METABOLIC PANEL
Anion gap: 10 (ref 5–15)
BUN: 41 mg/dL — ABNORMAL HIGH (ref 6–20)
CO2: 24 mmol/L (ref 22–32)
Calcium: 8 mg/dL — ABNORMAL LOW (ref 8.9–10.3)
Chloride: 94 mmol/L — ABNORMAL LOW (ref 98–111)
Creatinine, Ser: 1.85 mg/dL — ABNORMAL HIGH (ref 0.44–1.00)
GFR, Estimated: 31 mL/min — ABNORMAL LOW (ref >60)
Glucose, Bld: 392 mg/dL — ABNORMAL HIGH (ref 70–99)
Potassium: 4.2 mmol/L (ref 3.5–5.1)
Sodium: 128 mmol/L — ABNORMAL LOW (ref 135–145)

## 2021-03-14 LAB — PROTIME-INR
INR: 1.1 (ref 0.8–1.2)
INR: 1.2 (ref 0.8–1.2)
Prothrombin Time: 14.2 seconds (ref 11.4–15.2)
Prothrombin Time: 14.7 seconds (ref 11.4–15.2)

## 2021-03-14 LAB — LACTIC ACID, PLASMA
Lactic Acid, Venous: 1.6 mmol/L (ref 0.5–1.9)
Lactic Acid, Venous: 1.7 mmol/L (ref 0.5–1.9)
Lactic Acid, Venous: 2.7 mmol/L (ref 0.5–1.9)
Lactic Acid, Venous: 3.1 mmol/L (ref 0.5–1.9)

## 2021-03-14 LAB — MAGNESIUM: Magnesium: 2 mg/dL (ref 1.7–2.4)

## 2021-03-14 LAB — CORTISOL-AM, BLOOD: Cortisol - AM: 43.7 ug/dL — ABNORMAL HIGH (ref 6.7–22.6)

## 2021-03-14 LAB — BETA-HYDROXYBUTYRIC ACID: Beta-Hydroxybutyric Acid: 0.36 mmol/L — ABNORMAL HIGH (ref 0.05–0.27)

## 2021-03-14 LAB — GLUCOSE, CAPILLARY: Glucose-Capillary: 278 mg/dL — ABNORMAL HIGH (ref 70–99)

## 2021-03-14 LAB — HIV ANTIBODY (ROUTINE TESTING W REFLEX): HIV Screen 4th Generation wRfx: NONREACTIVE

## 2021-03-14 MED ORDER — VANCOMYCIN HCL IN DEXTROSE 1-5 GM/200ML-% IV SOLN: 1000.0000 mg | Freq: Once | INTRAVENOUS | Status: DC

## 2021-03-14 MED ORDER — ADULT MULTIVITAMIN W/MINERALS CH
ORAL_TABLET | Freq: Every day | ORAL | Status: DC
  Administered 2021-03-15 – 2021-03-19 (×5): 1 via ORAL
  Administered 2021-03-20: 11:00:00 2 via ORAL
  Administered 2021-03-21 – 2021-03-27 (×7): 1 via ORAL
  Filled 2021-03-14 (×13): qty 1

## 2021-03-14 MED ORDER — CEFAZOLIN SODIUM-DEXTROSE 2-4 GM/100ML-% IV SOLN
2.0000 g | Freq: Three times a day (TID) | INTRAVENOUS | Status: DC
  Administered 2021-03-14 – 2021-03-27 (×39): 2 g via INTRAVENOUS
  Filled 2021-03-14 (×40): qty 100

## 2021-03-14 MED ORDER — SODIUM CHLORIDE 0.9 % IV BOLUS
500.0000 mL | Freq: Once | INTRAVENOUS | Status: AC
  Administered 2021-03-14: 500 mL via INTRAVENOUS

## 2021-03-14 MED ORDER — SODIUM CHLORIDE 0.9 % IV SOLN
2.0000 g | Freq: Two times a day (BID) | INTRAVENOUS | Status: DC
  Administered 2021-03-14: 2 g via INTRAVENOUS
  Filled 2021-03-14: qty 2

## 2021-03-14 MED ORDER — ONDANSETRON HCL 4 MG/2ML IJ SOLN
4.0000 mg | Freq: Four times a day (QID) | INTRAMUSCULAR | Status: DC | PRN
  Administered 2021-03-14: 4 mg via INTRAVENOUS
  Filled 2021-03-14 (×2): qty 2

## 2021-03-14 MED ORDER — ACETAMINOPHEN 650 MG RE SUPP: 650.0000 mg | Freq: Four times a day (QID) | RECTAL | Status: DC | PRN

## 2021-03-14 MED ORDER — SODIUM CHLORIDE 0.9 % IV SOLN
12.5000 mg | Freq: Four times a day (QID) | INTRAVENOUS | Status: DC | PRN
  Administered 2021-03-14 – 2021-03-16 (×2): 12.5 mg via INTRAVENOUS
  Filled 2021-03-14: qty 12.5
  Filled 2021-03-14 (×3): qty 0.5

## 2021-03-14 MED ORDER — LACTATED RINGERS IV SOLN: INTRAVENOUS | Status: DC

## 2021-03-14 MED ORDER — SODIUM CHLORIDE 0.9 % IV SOLN: 2.0000 g | Freq: Once | INTRAVENOUS | Status: DC

## 2021-03-14 MED ORDER — INSULIN ASPART 100 UNIT/ML IJ SOLN
0.0000 [IU] | Freq: Three times a day (TID) | INTRAMUSCULAR | Status: DC
Start: 2021-03-14 — End: 2021-03-27
  Administered 2021-03-14 (×2): 15 [IU] via SUBCUTANEOUS
  Administered 2021-03-14: 22:00:00 11 [IU] via SUBCUTANEOUS
  Administered 2021-03-15: 21:00:00 7 [IU] via SUBCUTANEOUS
  Administered 2021-03-15 (×2): 11 [IU] via SUBCUTANEOUS
  Administered 2021-03-16 (×3): 4 [IU] via SUBCUTANEOUS
  Administered 2021-03-16 – 2021-03-17 (×2): 3 [IU] via SUBCUTANEOUS
  Administered 2021-03-17 – 2021-03-18 (×5): 4 [IU] via SUBCUTANEOUS
  Administered 2021-03-18 – 2021-03-19 (×3): 7 [IU] via SUBCUTANEOUS
  Administered 2021-03-19 – 2021-03-20 (×5): 4 [IU] via SUBCUTANEOUS
  Administered 2021-03-20: 18:00:00 7 [IU] via SUBCUTANEOUS
  Administered 2021-03-21 – 2021-03-22 (×6): 4 [IU] via SUBCUTANEOUS
  Administered 2021-03-22: 13:00:00 3 [IU] via SUBCUTANEOUS
  Administered 2021-03-22 – 2021-03-23 (×4): 4 [IU] via SUBCUTANEOUS
  Administered 2021-03-23: 09:00:00 3 [IU] via SUBCUTANEOUS
  Administered 2021-03-24 (×2): 4 [IU] via SUBCUTANEOUS
  Administered 2021-03-24 – 2021-03-25 (×3): 3 [IU] via SUBCUTANEOUS
  Administered 2021-03-25 (×2): 4 [IU] via SUBCUTANEOUS
  Administered 2021-03-25: 11:00:00 3 [IU] via SUBCUTANEOUS
  Administered 2021-03-26 (×2): 4 [IU] via SUBCUTANEOUS
  Administered 2021-03-26: 21:00:00 3 [IU] via SUBCUTANEOUS
  Administered 2021-03-27: 12:00:00 4 [IU] via SUBCUTANEOUS
  Administered 2021-03-27: 18:00:00 3 [IU] via SUBCUTANEOUS
  Filled 2021-03-14 (×48): qty 1

## 2021-03-14 MED ORDER — MAGNESIUM HYDROXIDE 400 MG/5ML PO SUSP
30.0000 mL | Freq: Every day | ORAL | Status: DC | PRN
  Administered 2021-03-17 – 2021-03-19 (×2): 30 mL via ORAL
  Filled 2021-03-14 (×2): qty 30

## 2021-03-14 MED ORDER — SODIUM CHLORIDE 0.9 % IV BOLUS (SEPSIS)
500.0000 mL | Freq: Once | INTRAVENOUS | Status: AC
  Administered 2021-03-14: 500 mL via INTRAVENOUS

## 2021-03-14 MED ORDER — ATORVASTATIN CALCIUM 20 MG PO TABS
40.0000 mg | ORAL_TABLET | Freq: Every day | ORAL | Status: DC
  Administered 2021-03-15 – 2021-03-27 (×13): 40 mg via ORAL
  Filled 2021-03-14 (×13): qty 2

## 2021-03-14 MED ORDER — SODIUM CHLORIDE 0.9 % IV SOLN
2.0000 g | Freq: Once | INTRAVENOUS | Status: AC
  Administered 2021-03-14: 2 g via INTRAVENOUS
  Filled 2021-03-14: qty 2

## 2021-03-14 MED ORDER — ONDANSETRON HCL 4 MG PO TABS: 4.0000 mg | ORAL_TABLET | Freq: Four times a day (QID) | ORAL | Status: DC | PRN

## 2021-03-14 MED ORDER — PROCHLORPERAZINE EDISYLATE 10 MG/2ML IJ SOLN
5.0000 mg | Freq: Once | INTRAMUSCULAR | Status: AC
  Administered 2021-03-14: 5 mg via INTRAVENOUS
  Filled 2021-03-14: qty 1

## 2021-03-14 MED ORDER — AMLODIPINE BESYLATE 5 MG PO TABS: 5.0000 mg | ORAL_TABLET | Freq: Every day | ORAL | Status: DC

## 2021-03-14 MED ORDER — VANCOMYCIN VARIABLE DOSE PER UNSTABLE RENAL FUNCTION (PHARMACIST DOSING): Status: DC

## 2021-03-14 MED ORDER — ACETAMINOPHEN 325 MG PO TABS
650.0000 mg | ORAL_TABLET | Freq: Four times a day (QID) | ORAL | Status: DC | PRN
  Administered 2021-03-18: 650 mg via ORAL
  Filled 2021-03-14: qty 2

## 2021-03-14 MED ORDER — ONDANSETRON HCL 4 MG/2ML IJ SOLN: 4.0000 mg | Freq: Once | INTRAMUSCULAR | Status: AC

## 2021-03-14 MED ORDER — INFLUENZA VAC SPLIT QUAD 0.5 ML IM SUSY
0.5000 mL | PREFILLED_SYRINGE | INTRAMUSCULAR | Status: AC
  Administered 2021-03-16: 08:00:00 0.5 mL via INTRAMUSCULAR
  Filled 2021-03-14: qty 0.5

## 2021-03-14 MED ORDER — LACTATED RINGERS IV BOLUS
2000.0000 mL | Freq: Once | INTRAVENOUS | Status: AC
  Administered 2021-03-14: 2000 mL via INTRAVENOUS

## 2021-03-14 MED ORDER — GLIPIZIDE 10 MG PO TABS
10.0000 mg | ORAL_TABLET | Freq: Two times a day (BID) | ORAL | Status: DC
  Filled 2021-03-14 (×2): qty 1

## 2021-03-14 MED ORDER — DROPERIDOL 2.5 MG/ML IJ SOLN
2.5000 mg | Freq: Once | INTRAMUSCULAR | Status: AC
  Administered 2021-03-14: 2.5 mg via INTRAVENOUS
  Filled 2021-03-14: qty 2

## 2021-03-14 MED ORDER — VANCOMYCIN HCL 2000 MG/400ML IV SOLN
2000.0000 mg | Freq: Once | INTRAVENOUS | Status: AC
  Administered 2021-03-14: 2000 mg via INTRAVENOUS
  Filled 2021-03-14: qty 400

## 2021-03-14 MED ORDER — ONDANSETRON HCL 4 MG/2ML IJ SOLN
INTRAMUSCULAR | Status: AC
  Administered 2021-03-14: 4 mg via INTRAVENOUS
  Filled 2021-03-14: qty 2

## 2021-03-14 MED ORDER — INSULIN GLARGINE-YFGN 100 UNIT/ML ~~LOC~~ SOLN
5.0000 [IU] | Freq: Two times a day (BID) | SUBCUTANEOUS | Status: DC
  Administered 2021-03-15 – 2021-03-16 (×4): 5 [IU] via SUBCUTANEOUS
  Filled 2021-03-14 (×5): qty 0.05

## 2021-03-14 MED ORDER — ENOXAPARIN SODIUM 60 MG/0.6ML IJ SOSY
0.5000 mg/kg | PREFILLED_SYRINGE | INTRAMUSCULAR | Status: DC
  Administered 2021-03-14 – 2021-03-27 (×13): 52.5 mg via SUBCUTANEOUS
  Filled 2021-03-14 (×13): qty 0.6

## 2021-03-14 MED ORDER — TRAZODONE HCL 50 MG PO TABS
25.0000 mg | ORAL_TABLET | Freq: Every evening | ORAL | Status: DC | PRN
  Administered 2021-03-16 – 2021-03-22 (×2): 25 mg via ORAL
  Filled 2021-03-14 (×2): qty 1

## 2021-03-14 MED ORDER — METRONIDAZOLE 500 MG/100ML IV SOLN
500.0000 mg | Freq: Two times a day (BID) | INTRAVENOUS | Status: DC
  Administered 2021-03-14: 500 mg via INTRAVENOUS
  Filled 2021-03-14: qty 100

## 2021-03-14 MED ORDER — LACTATED RINGERS IV BOLUS: 1000.0000 mL | Freq: Once | INTRAVENOUS | Status: DC

## 2021-03-14 NOTE — ED Notes (Signed)
Pt's O2 sats dropped into low 90's. Patient's RR also increased to 30's and so applied nasal cannula 2L/min for patient. Pt denied SOB but said she was breathing faster because she was not feeling good overall. Will inform provider of O2 supplementation.

## 2021-03-14 NOTE — Consult Note (Signed)
Reason for Consult: Nonviable left foot Referring Physician: Dr. Gareth Eagle is an 60 y.o. female.  HPI: This is a very nice 60 year old female who has struggled with diabetic foot problems over the years.  She has had sequential toe amputations amputations done, more recently a medial malleolar ulceration when she was getting treated by Dr. Luana Shu.  She came to the emergency room yesterday after feeling poorly, evaluation overnight has revealed white count of 18, expressible drainage from the medial malleolus, talar distruction, lactic acid initially elevated.  She is tachycardic, with bacteremia.  The feeling at the time now from podiatry is the foot is nonsalvageable.  We are asked to see her for amputation at the BKA level.  Past Medical History:  Diagnosis Date   Diabetes mellitus without complication (HCC)    Hypertension    PONV (postoperative nausea and vomiting)    the day after rt 5th toe amputation surgery    Past Surgical History:  Procedure Laterality Date   AMPUTATION Right 11/10/2018   Procedure: AMPUTATION RAY (amputation 5th metatarsal Right foot;  Surgeon: Caroline More, DPM;  Location: [facility];  Service: Podiatry;  Laterality: Right;   AMPUTATION TOE Right 12/05/2018   Procedure: RAY RIGHT 4TH AMPUTATION;  Surgeon: Caroline More, DPM;  Location: [facility];  Service: Podiatry;  Laterality: Right;  mac with local DIABETIC-oral meds   BONE BIOPSY Right 11/10/2018   Procedure: BONE BIOPSY- Right 4th toe;  Surgeon: Caroline More, DPM;  Location: [facility];  Service: Podiatry;  Laterality: Right;   BUNIONECTOMY Bilateral    COLONOSCOPY WITH PROPOFOL N/A 09/07/2018   Procedure: COLONOSCOPY WITH PROPOFOL;  Surgeon: Lin Landsman, MD;  Location: [facility];  Service: Gastroenterology;  Laterality: N/A;   COLONOSCOPY WITH PROPOFOL N/A 09/17/2019   Procedure: COLONOSCOPY WITH PROPOFOL;  Surgeon: Lin Landsman, MD;  Location: [facility] ENDOSCOPY;  Service:  Gastroenterology;  Laterality: N/A;    Family History  Problem Relation Age of Onset   Heart disease Father    Heart attack Father    Heart disease Maternal Grandfather    Breast cancer Neg Hx     Social History:  reports that she has never smoked. She has never used smokeless tobacco. She reports that she does not currently use alcohol. She reports that she does not use drugs.  Allergies: No Known Allergies  Medications: I have reviewed the patient's current medications.  Results for orders placed or performed during the hospital encounter of 03/13/21 (from the past 48 hour(s))  Urinalysis, Routine w reflex microscopic     Status: Abnormal   Collection Time: 03/13/21 10:27 AM  Result Value Ref Range   Color, Urine YELLOW (A) YELLOW   APPearance CLOUDY (A) CLEAR   Specific Gravity, Urine 1.022 1.005 - 1.030   pH 5.0 5.0 - 8.0   Glucose, UA >=500 (A) NEGATIVE mg/dL   Hgb urine dipstick SMALL (A) NEGATIVE   Bilirubin Urine NEGATIVE NEGATIVE   Ketones, ur 5 (A) NEGATIVE mg/dL   Protein, ur 30 (A) NEGATIVE mg/dL   Nitrite NEGATIVE NEGATIVE   Leukocytes,Ua SMALL (A) NEGATIVE   RBC / HPF 11-20 0 - 5 RBC/hpf   WBC, UA 21-50 0 - 5 WBC/hpf   Bacteria, UA MANY (A) NONE SEEN   Squamous Epithelial / LPF 6-10 0 - 5   Mucus PRESENT    Budding Yeast PRESENT    Hyaline Casts, UA PRESENT     Comment: Performed at [facility],  [place], [place] [address]  Comprehensive metabolic panel     Status: Abnormal   Collection Time: 03/13/21 11:49 PM  Result Value Ref Range   Sodium 128 (L) 135 - 145 mmol/L   Potassium 3.8 3.5 - 5.1 mmol/L   Chloride 92 (L) 98 - 111 mmol/L   CO2 24 22 - 32 mmol/L   Glucose, Bld 390 (H) 70 - 99 mg/dL    Comment: Glucose reference range applies only to samples taken after fasting for at least 8 hours.   BUN 44 (H) 6 - 20 mg/dL   Creatinine, Ser 2.15 (H) 0.44 - 1.00 mg/dL   Calcium 8.4 (L) 8.9 - 10.3 mg/dL   Total Protein 6.8 6.5 - 8.1  g/dL   Albumin 2.7 (L) 3.5 - 5.0 g/dL   AST 25 15 - 41 U/L   ALT 23 0 - 44 U/L   Alkaline Phosphatase 213 (H) 38 - 126 U/L   Total Bilirubin 1.9 (H) 0.3 - 1.2 mg/dL   GFR, Estimated 26 (L) >60 mL/min    Comment: (NOTE) Calculated using the CKD-EPI Creatinine Equation (2021)    Anion gap 12 5 - 15    Comment: Performed at [facility], [address] [place], [place] [address]  Lactic acid, plasma     Status: Abnormal   Collection Time: 03/13/21 11:49 PM  Result Value Ref Range   Lactic Acid, Venous 3.1 (HH) 0.5 - 1.9 mmol/L    Comment: CRITICAL RESULT CALLED TO, READ BACK BY AND VERIFIED WITH [facility] HICKS AT 0049 03/14/2021 DLB Performed at [facility], [address] [place], [place] [address]   CBC with Differential     Status: Abnormal   Collection Time: 03/13/21 11:49 PM  Result Value Ref Range   WBC 18.1 (H) 4.0 - 10.5 K/uL   RBC 3.82 (L) 3.87 - 5.11 MIL/uL   Hemoglobin 10.5 (L) 12.0 - 15.0 g/dL   HCT 32.1 (L) 36.0 - 46.0 %   MCV 84.0 80.0 - 100.0 fL   MCH 27.5 26.0 - 34.0 pg   MCHC 32.7 30.0 - 36.0 g/dL   RDW 14.2 11.5 - 15.5 %   Platelets 177 150 - 400 K/uL   nRBC 0.0 0.0 - 0.2 %   Neutrophils Relative % 93 %   Neutro Abs 16.8 (H) 1.7 - 7.7 K/uL   Lymphocytes Relative 2 %   Lymphs Abs 0.3 (L) 0.7 - 4.0 K/uL   Monocytes Relative 4 %   Monocytes Absolute 0.8 0.1 - 1.0 K/uL   Eosinophils Relative 0 %   Eosinophils Absolute 0.0 0.0 - 0.5 K/uL   Basophils Relative 0 %   Basophils Absolute 0.0 0.0 - 0.1 K/uL   Immature Granulocytes 1 %   Abs Immature Granulocytes 0.12 (H) 0.00 - 0.07 K/uL    Comment: Performed at [facility], [address] [place], [place] [address]  Protime-INR     Status: None   Collection Time: 03/13/21 11:49 PM  Result Value Ref Range   Prothrombin Time 14.2 11.4 - 15.2 seconds   INR 1.1 0.8 - 1.2    Comment: (NOTE) INR goal varies based on device and disease states. Performed at [facility],  [address] [place],  [address]   Culture, blood (Routine x 2)     Status: None (Preliminary result)   Collection Time: 03/13/21 11:49 PM   Specimen: BLOOD  Result Value Ref Range   Specimen  Description BLOOD LEFT ASSIST CONTROL    Special Requests      BOTTLES DRAWN AEROBIC AND ANAEROBIC Blood Culture adequate volume   Culture  Setup Time      Organism ID to follow GRAM POSITIVE COCCI IN BOTH AEROBIC AND ANAEROBIC BOTTLES CRITICAL RESULT CALLED TO, READ BACK BY AND VERIFIED WITH: ALEX CHAPPELL AT 5053 03/14/21.PMF Performed at [facility], [address] [place], [place] [address]    Culture GRAM POSITIVE COCCI    Report Status PENDING   Culture, blood (Routine x 2)     Status: None (Preliminary result)   Collection Time: 03/13/21 11:49 PM   Specimen: BLOOD  Result Value Ref Range   Specimen Description BLOOD RIGHT FOREARM    Special Requests      BOTTLES DRAWN AEROBIC AND ANAEROBIC Blood Culture adequate volume   Culture  Setup Time      GRAM POSITIVE COCCI IN BOTH AEROBIC AND ANAEROBIC BOTTLES CRITICAL RESULT CALLED TO, READ BACK BY AND VERIFIED WITH: ALEX CHAPPELL AT 4193 03/14/21.PMF Performed at [facility], [address] [place], [place] [address]    Culture GRAM POSITIVE COCCI    Report Status PENDING   Beta-hydroxybutyric acid     Status: Abnormal   Collection Time: 03/13/21 11:49 PM  Result Value Ref Range   Beta-Hydroxybutyric Acid 0.36 (H) 0.05 - 0.27 mmol/L    Comment: Performed at [facility], [address] [place], [place] [address]  Resp Panel by RT-PCR (Flu A&B, Covid) Nasopharyngeal Swab     Status: None   Collection Time: 03/13/21 11:49 PM   Specimen: Nasopharyngeal Swab; Nasopharyngeal(NP) swabs in vial transport medium  Result Value Ref Range   SARS Coronavirus 2 by RT PCR NEGATIVE NEGATIVE    Comment: (NOTE) SARS-CoV-2 target nucleic acids are NOT DETECTED.  The SARS-CoV-2 RNA is generally  detectable in upper respiratory specimens during the acute phase of infection. The lowest concentration of SARS-CoV-2 viral copies this assay can detect is 138 copies/mL. A negative result does not preclude SARS-Cov-2 infection and should not be used as the sole basis for treatment or other patient management decisions. A negative result may occur with  improper specimen collection/handling, submission of specimen other than nasopharyngeal swab, presence of viral mutation(s) within the areas targeted by this assay, and inadequate number of viral copies(<138 copies/mL). A negative result must be combined with clinical observations, patient history, and epidemiological information. The expected result is Negative.  Fact Sheet for Patients:  EntrepreneurPulse.com.au  Fact Sheet for Healthcare Providers:  IncredibleEmployment.be  This test is no t yet approved or cleared by the [place] FDA and  has been authorized for detection and/or diagnosis of SARS-CoV-2 by FDA under an Emergency Use Authorization (EUA). This EUA will remain  in effect (meaning this test can be used) for the duration of the COVID-19 declaration under Section 564(b)(1) of the Act, 21 U.S.C.section 360bbb-3(b)(1), unless the authorization is terminated  or revoked sooner.       Influenza A by PCR NEGATIVE NEGATIVE   Influenza B by PCR NEGATIVE NEGATIVE    Comment: (NOTE) The Xpert Xpress SARS-CoV-2/FLU/RSV plus assay is intended as an aid in the diagnosis of influenza from Nasopharyngeal swab specimens and should not be used as a sole basis for treatment. Nasal washings and aspirates are unacceptable for Xpert Xpress SARS-CoV-2/FLU/RSV testing.  Fact Sheet for Patients: EntrepreneurPulse.com.au  Fact Sheet for Healthcare Providers: IncredibleEmployment.be  This test is not yet approved or cleared  by the [place] and has  been authorized for detection and/or diagnosis of SARS-CoV-2 by FDA under an Emergency Use Authorization (EUA). This EUA will remain in effect (meaning this test can be used) for the duration of the COVID-19 declaration under Section 564(b)(1) of the Act, 21 U.S.C. section 360bbb-3(b)(1), unless the authorization is terminated or revoked.  Performed at [facility], [address] [place], [place] [address]   Magnesium     Status: None   Collection Time: 03/13/21 11:49 PM  Result Value Ref Range   Magnesium 2.0 1.7 - 2.4 mg/dL    Comment: Performed at [facility], [address] [place], [place] [address]  Lactic acid, plasma     Status: Abnormal   Collection Time: 03/13/21 11:49 PM  Result Value Ref Range   Lactic Acid, Venous 2.7 (HH) 0.5 - 1.9 mmol/L    Comment: CRITICAL VALUE NOTED. VALUE IS CONSISTENT WITH PREVIOUSLY REPORTED/CALLED VALUE DLB Performed at [facility], [address] [place], [place] [address]   Blood Culture ID Panel (Reflexed)     Status: Abnormal   Collection Time: 03/13/21 11:49 PM  Result Value Ref Range   Enterococcus faecalis NOT DETECTED NOT DETECTED   Enterococcus Faecium NOT DETECTED NOT DETECTED   Listeria monocytogenes NOT DETECTED NOT DETECTED   Staphylococcus species DETECTED (A) NOT DETECTED    Comment: CRITICAL RESULT CALLED TO, READ BACK BY AND VERIFIED WITH: ALEX CHAPPELL AT 1314 03/14/21.PMF    Staphylococcus aureus (BCID) DETECTED (A) NOT DETECTED    Comment: CRITICAL RESULT CALLED TO, READ BACK BY AND VERIFIED WITH: ALEX CHAPPELL AT 4166 03/14/21.PMF    Staphylococcus epidermidis NOT DETECTED NOT DETECTED   Staphylococcus lugdunensis NOT DETECTED NOT DETECTED   Streptococcus species NOT DETECTED NOT DETECTED   Streptococcus agalactiae NOT DETECTED NOT DETECTED   Streptococcus pneumoniae NOT DETECTED NOT DETECTED   Streptococcus pyogenes NOT DETECTED NOT DETECTED   A.calcoaceticus-baumannii NOT  DETECTED NOT DETECTED   Bacteroides fragilis NOT DETECTED NOT DETECTED   Enterobacterales NOT DETECTED NOT DETECTED   Enterobacter cloacae complex NOT DETECTED NOT DETECTED   Escherichia coli NOT DETECTED NOT DETECTED   Klebsiella aerogenes NOT DETECTED NOT DETECTED   Klebsiella oxytoca NOT DETECTED NOT DETECTED   Klebsiella pneumoniae NOT DETECTED NOT DETECTED   Proteus species NOT DETECTED NOT DETECTED   Salmonella species NOT DETECTED NOT DETECTED   Serratia marcescens NOT DETECTED NOT DETECTED   Haemophilus influenzae NOT DETECTED NOT DETECTED   Neisseria meningitidis NOT DETECTED NOT DETECTED   Pseudomonas aeruginosa NOT DETECTED NOT DETECTED   Stenotrophomonas maltophilia NOT DETECTED NOT DETECTED   Candida albicans NOT DETECTED NOT DETECTED   Candida auris NOT DETECTED NOT DETECTED   Candida glabrata NOT DETECTED NOT DETECTED   Candida krusei NOT DETECTED NOT DETECTED   Candida parapsilosis NOT DETECTED NOT DETECTED   Candida tropicalis NOT DETECTED NOT DETECTED   Cryptococcus neoformans/gattii NOT DETECTED NOT DETECTED   Meth resistant mecA/C and MREJ NOT DETECTED NOT DETECTED    Comment: Performed at [facility], [address] [place], [place] [address]  Aerobic/Anaerobic Culture w Gram Stain (surgical/deep wound)     Status: None (Preliminary result)   Collection Time: 03/13/21 11:50 PM   Specimen: Ankle; Wound  Result Value Ref Range   Specimen Description      ANKLE Performed at [facility], [address], [place],  [address]    Special Requests  NONE Performed at [facility], [place], [place] [address]    Gram Stain      NO SQUAMOUS EPITHELIAL CELLS SEEN FEW WBC SEEN MODERATE GRAM POSITIVE COCCI Performed at [facility], [address], [place], [place] [address]    Culture PENDING    Report Status PENDING   Lactic acid, plasma     Status: None   Collection Time: 03/14/21  4:43 AM  Result  Value Ref Range   Lactic Acid, Venous 1.7 0.5 - 1.9 mmol/L    Comment: Performed at [facility], [address] [place], [place] [address]  HIV Antibody (routine testing w rflx)     Status: None   Collection Time: 03/14/21  4:43 AM  Result Value Ref Range   HIV Screen 4th Generation wRfx Non Reactive Non Reactive    Comment: Performed at [facility], [place] [address], [place], [place] [address]  Protime-INR     Status: None   Collection Time: 03/14/21  4:43 AM  Result Value Ref Range   Prothrombin Time 14.7 11.4 - 15.2 seconds   INR 1.2 0.8 - 1.2    Comment: (NOTE) INR goal varies based on device and disease states. Performed at [facility], [address] [place], [place] [address]   Cortisol-am, blood     Status: Abnormal   Collection Time: 03/14/21  4:43 AM  Result Value Ref Range   Cortisol - AM 43.7 (H) 6.7 - 22.6 ug/dL    Comment: Performed at [facility] [address], [place], [place] [address]  Procalcitonin     Status: None   Collection Time: 03/14/21  4:43 AM  Result Value Ref Range   Procalcitonin 4.26 ng/mL    Comment:        Interpretation: PCT > 2 ng/mL: Systemic infection (sepsis) is likely, unless other causes are known. (NOTE)       Sepsis PCT Algorithm           Lower Respiratory Tract                                      Infection PCT Algorithm    ----------------------------     ----------------------------         PCT < 0.25 ng/mL                PCT < 0.10 ng/mL          Strongly encourage             Strongly discourage   discontinuation of antibiotics    initiation of antibiotics    ----------------------------     -----------------------------       PCT 0.25 - 0.50 ng/mL            PCT 0.10 - 0.25 ng/mL               OR       >80% decrease in PCT            Discourage initiation of                                            antibiotics      Encourage discontinuation  of antibiotics     ----------------------------     -----------------------------         PCT >= 0.50 ng/mL              PCT 0.26 - 0.50 ng/mL               AND       <80% decrease in PCT              Encourage initiation of                                             antibiotics       Encourage continuation           of antibiotics    ----------------------------     -----------------------------        PCT >= 0.50 ng/mL                  PCT > 0.50 ng/mL               AND         increase in PCT                  Strongly encourage                                      initiation of antibiotics    Strongly encourage escalation           of antibiotics                                     -----------------------------                                           PCT <= 0.25 ng/mL                                                 OR                                        > 80% decrease in PCT                                      Discontinue / Do not initiate                                             antibiotics  Performed at [facility], [address] [place], [place] [address]   Basic metabolic panel     Status: Abnormal   Collection Time: 03/14/21  4:43 AM  Result Value Ref Range   Sodium 128 (L) 135 - 145  mmol/L   Potassium 4.2 3.5 - 5.1 mmol/L   Chloride 94 (L) 98 - 111 mmol/L   CO2 24 22 - 32 mmol/L   Glucose, Bld 392 (H) 70 - 99 mg/dL    Comment: Glucose reference range applies only to samples taken after fasting for at least 8 hours.   BUN 41 (H) 6 - 20 mg/dL   Creatinine, Ser 1.85 (H) 0.44 - 1.00 mg/dL   Calcium 8.0 (L) 8.9 - 10.3 mg/dL   GFR, Estimated 31 (L) >60 mL/min    Comment: (NOTE) Calculated using the CKD-EPI Creatinine Equation (2021)    Anion gap 10 5 - 15    Comment: Performed at [facility], [address] [place], [place] [address]  CBC     Status: Abnormal   Collection Time: 03/14/21  4:43 AM  Result Value Ref Range   WBC 17.7 (H) 4.0 - 10.5 K/uL    RBC 3.43 (L) 3.87 - 5.11 MIL/uL   Hemoglobin 9.5 (L) 12.0 - 15.0 g/dL   HCT 28.9 (L) 36.0 - 46.0 %   MCV 84.3 80.0 - 100.0 fL   MCH 27.7 26.0 - 34.0 pg   MCHC 32.9 30.0 - 36.0 g/dL   RDW 14.4 11.5 - 15.5 %   Platelets 154 150 - 400 K/uL   nRBC 0.0 0.0 - 0.2 %    Comment: Performed at [facility], [address] [place], [place] [address]  CBG monitoring, ED     Status: Abnormal   Collection Time: 03/14/21  7:44 AM  Result Value Ref Range   Glucose-Capillary 377 (H) 70 - 99 mg/dL    Comment: Glucose reference range applies only to samples taken after fasting for at least 8 hours.  Lactic acid, plasma     Status: None   Collection Time: 03/14/21  7:48 AM  Result Value Ref Range   Lactic Acid, Venous 1.6 0.5 - 1.9 mmol/L    Comment: Performed at [facility], [address] [place], [place] [address]  CBG monitoring, ED     Status: Abnormal   Collection Time: 03/14/21 12:35 PM  Result Value Ref Range   Glucose-Capillary 325 (H) 70 - 99 mg/dL    Comment: Glucose reference range applies only to samples taken after fasting for at least 8 hours.    MR ANKLE LEFT WO CONTRAST  Result Date: 03/14/2021 CLINICAL DATA:  Osteomyelitis EXAM: MRI OF THE LEFT ANKLE WITHOUT CONTRAST TECHNIQUE: Multiplanar, multisequence MR imaging of the ankle was performed. No intravenous contrast was administered. COMPARISON:  Same-day foot radiograph FINDINGS: Bones/Joint/Cartilage There is subtalar malalignment with medially rotated talus consistent with Charcot arthropathy. Subtalar joint effusion. There is bony edema and some low T1 signal in the medial talar head. There is additional periarticular bony edema throughout the hindfoot and midfoot favored to be related to neuropathic arthropathy. Subacute nondisplaced fracture of the talar dome, vertically oriented, extending to the tibiotalar and subtalar joints (sagittal T1 image 10, coronal T2 image 20-22. Mild tibiotalar chondrosis.  Ligaments Chronic ATFL sprain/partial tear. the PTFL is intact. No structure resembling the calcaneofibular ligament is seen. Chronic deltoid and spring ligament tearing. These findings are consistent with Charcot arthropathy. Muscles and Tendons There is tenosynovitis of the peroneal tendons above and below the lateral malleolus. There is tenosynovitis and tendinosis of the posterior tibial tendon above and below the medial malleolus. There is thickening of the central bundle plantar fascia with mildly increased signal. The Achilles tendon is  intact. Soft tissue There is a soft tissue ulcer along the medial midfoot with underlying signal abnormality in the talar head. Extensive soft tissue swelling. IMPRESSION: Soft tissue ulcer along the medial midfoot with underlying probable osteomyelitis in the medial talar head. Charcot arthropathy with subtalar malalignment and associated periarticular marrow edema. Subacute nondisplaced vertically oriented talar dome fracture. Large subtalar joint effusion, likely related to chronic joint malalignment/neuropathic arthropathy and the talar fracture. Sterility is indeterminate on MRI. Tenosynovitis of the posterior tibial tendon above and below the medial malleolus. Tenosynovitis of the peroneal tendons above and below the lateral malleolus. Electronically Signed   By: Maurine Simmering M.D.   On: 03/14/2021 12:08   DG Chest Port 1 View  Result Date: 03/14/2021 CLINICAL DATA:  Possible sepsis EXAM: PORTABLE CHEST 1 VIEW COMPARISON:  None. FINDINGS: Cardiac shadow is within normal limits. The lungs are hypoinflated with mild right basilar atelectasis. Elevation of the right hemidiaphragm is noted. Bony structures are within normal limits. IMPRESSION: Mild right basilar atelectasis. Electronically Signed   By: Inez Catalina M.D.   On: 03/14/2021 00:40   DG Foot 2 Views Left  Addendum Date: 03/14/2021   ADDENDUM REPORT: 03/14/2021 00:46 ADDENDUM: Additionally along the medial  aspect of the talus there is some suggestion of bony resorption which given the known overlying soft tissue wound may be related to osteomyelitis. MRI may be helpful for further evaluation. Electronically Signed   By: Inez Catalina M.D.   On: 03/14/2021 00:46   Result Date: 03/14/2021 CLINICAL DATA:  Possible sepsis, initial encounter EXAM: LEFT FOOT - 2 VIEW COMPARISON:  None. FINDINGS: Postsurgical changes are noted in the first metatarsal. Degenerative changes in the first MTP joint are seen. Some widening of the talonavicular joint is seen with flattening of the plantar arch identified. These are likely related to more chronic changes possibly related to Charcot deformity. No acute fracture is seen. Mild soft tissue swelling is noted distally. IMPRESSION: Chronic appearing changes particularly at the talonavicular joint suggestive of early Charcot joint changes. No acute fracture is noted. Electronically Signed: By: Inez Catalina M.D. On: 03/14/2021 00:42    Review of Systems  Constitutional:  Positive for fever.  Gastrointestinal:  Positive for nausea.  Neurological:  Positive for weakness.  Blood pressure 109/61, pulse (!) 114, temperature 99.1 F (37.3 C), temperature source Oral, resp. rate (!) 29, height _0  (1.676 m), weight 104.3 kg, SpO2 97 %. Physical Exam Vitals and nursing note reviewed.  Constitutional:      General: She is not in acute distress.    Appearance: She is ill-appearing. She is not toxic-appearing or diaphoretic.  Cardiovascular:     Rate and Rhythm: Tachycardia present.  Pulmonary:     Effort: Pulmonary effort is normal.  Musculoskeletal:     Left lower leg: No edema.     Comments: Dressing in place left foot with ulcerated medial malleolus where pus was expressed earlier today  Neurological:     General: No focal deficit present.     Mental Status: She is alert and oriented to person, place, and time.    Assessment/Plan: At this point in time, she appears to  have a nonsalvageable foot with sepsis.  She has a large malleolar ulceration, Charcot deformity, and deep structural damage that cannot be remediated.  I did talk to her about doing amputation, she is not yet willing to assent, but I will see her early in the morning to see if she will be  willing to do so tomorrow.  She is still tachycardic with nausea, perhaps with antibiotics and resuscitation she will be more stable for the OR tomorrow regardless.    Again, she was not willing to agree currently.  I will touch base with admitting service.  I spoke with Dr. Cleda Mccreedy earlier today.  Zara Chess 03/14/2021, 2:39 PM

## 2021-03-14 NOTE — Consult Note (Signed)
Reason for Consult: Infected ulceration left foot. Referring Physician: Ronnell Makarewicz is an 60 y.o. female.  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  Has been managed outpatient by our clinic.  Presented to the hospital due to recent onset of significant nausea and vomiting.  Relates that her foot has been getting significantly worse over the last few days.  Past Medical History:  Diagnosis Date   Diabetes mellitus without complication (HCC)    Hypertension    PONV (postoperative nausea and vomiting)    the day after rt 5th toe amputation surgery    Past Surgical History:  Procedure Laterality Date   AMPUTATION Right 11/10/2018   Procedure: AMPUTATION RAY (amputation 5th metatarsal Right foot;  Surgeon: Caroline More, DPM;  Location: [facility];  Service: Podiatry;  Laterality: Right;   AMPUTATION TOE Right 12/05/2018   Procedure: RAY RIGHT 4TH AMPUTATION;  Surgeon: Caroline More, DPM;  Location: [place];  Service: Podiatry;  Laterality: Right;  mac with local DIABETIC-oral meds   BONE BIOPSY Right 11/10/2018   Procedure: BONE BIOPSY- Right 4th toe;  Surgeon: Caroline More, DPM;  Location: [facility];  Service: Podiatry;  Laterality: Right;   BUNIONECTOMY Bilateral    COLONOSCOPY WITH PROPOFOL N/A 09/07/2018   Procedure: COLONOSCOPY WITH PROPOFOL;  Surgeon: Lin Landsman, MD;  Location: [facility] ENDOSCOPY;  Service: Gastroenterology;  Laterality: N/A;   COLONOSCOPY WITH PROPOFOL N/A 09/17/2019   Procedure: COLONOSCOPY WITH PROPOFOL;  Surgeon: Lin Landsman, MD;  Location: [facility] ENDOSCOPY;  Service: Gastroenterology;  Laterality: N/A;    Family History  Problem Relation Age of Onset   Heart disease Father    Heart attack Father    Heart disease Maternal Grandfather    Breast cancer Neg Hx     Social History:  reports that she has never smoked. She has never used  smokeless tobacco. She reports that she does not currently use alcohol. She reports that she does not use drugs.  Allergies: No Known Allergies  Medications: Scheduled:  atorvastatin  40 mg Oral Daily   enoxaparin (LOVENOX) injection  0.5 mg/kg Subcutaneous Q24H   glipiZIDE  10 mg Oral BID AC   insulin aspart  0-20 Units Subcutaneous TID AC & HS   multivitamin with minerals   Oral Daily   vancomycin variable dose per unstable renal function (pharmacist dosing)   Does not apply See admin instructions    Results for orders placed or performed during the hospital encounter of 03/13/21 (from the past 48 hour(s))  Urinalysis, Routine w reflex microscopic     Status: Abnormal   Collection Time: 03/13/21 10:27 AM  Result Value Ref Range   Color, Urine YELLOW (A) YELLOW   APPearance CLOUDY (A) CLEAR   Specific Gravity, Urine 1.022 1.005 - 1.030   pH 5.0 5.0 - 8.0   Glucose, UA >=500 (A) NEGATIVE mg/dL   Hgb urine dipstick SMALL (A) NEGATIVE   Bilirubin Urine NEGATIVE NEGATIVE   Ketones, ur 5 (A) NEGATIVE mg/dL   Protein, ur 30 (A) NEGATIVE mg/dL   Nitrite NEGATIVE NEGATIVE   Leukocytes,Ua SMALL (A) NEGATIVE   RBC / HPF 11-20 0 - 5 RBC/hpf   WBC, UA 21-50 0 - 5 WBC/hpf   Bacteria, UA MANY (A) NONE SEEN   Squamous Epithelial / LPF 6-10 0 - 5   Mucus PRESENT    Budding Yeast PRESENT  Hyaline Casts, UA PRESENT     Comment: Performed at [facility], [address] [place], [place] [address]  Comprehensive metabolic panel     Status: Abnormal   Collection Time: 03/13/21 11:49 PM  Result Value Ref Range   Sodium 128 (L) 135 - 145 mmol/L   Potassium 3.8 3.5 - 5.1 mmol/L   Chloride 92 (L) 98 - 111 mmol/L   CO2 24 22 - 32 mmol/L   Glucose, Bld 390 (H) 70 - 99 mg/dL    Comment: Glucose reference range applies only to samples taken after fasting for at least 8 hours.   BUN 44 (H) 6 - 20 mg/dL   Creatinine, Ser 2.15 (H) 0.44 - 1.00 mg/dL   Calcium 8.4 (L) 8.9 - 10.3 mg/dL    Total Protein 6.8 6.5 - 8.1 g/dL   Albumin 2.7 (L) 3.5 - 5.0 g/dL   AST 25 15 - 41 U/L   ALT 23 0 - 44 U/L   Alkaline Phosphatase 213 (H) 38 - 126 U/L   Total Bilirubin 1.9 (H) 0.3 - 1.2 mg/dL   GFR, Estimated 26 (L) >60 mL/min    Comment: (NOTE) Calculated using the CKD-EPI Creatinine Equation (2021)    Anion gap 12 5 - 15    Comment: Performed at [facility], [address] [place], [place] [address]  Lactic acid, plasma     Status: Abnormal   Collection Time: 03/13/21 11:49 PM  Result Value Ref Range   Lactic Acid, Venous 3.1 (HH) 0.5 - 1.9 mmol/L    Comment: CRITICAL RESULT CALLED TO, READ BACK BY AND VERIFIED WITH [facility] HICKS AT 0049 03/14/2021 DLB Performed at [facility], [address] [place], [place] [address]   CBC with Differential     Status: Abnormal   Collection Time: 03/13/21 11:49 PM  Result Value Ref Range   WBC 18.1 (H) 4.0 - 10.5 K/uL   RBC 3.82 (L) 3.87 - 5.11 MIL/uL   Hemoglobin 10.5 (L) 12.0 - 15.0 g/dL   HCT 32.1 (L) 36.0 - 46.0 %   MCV 84.0 80.0 - 100.0 fL   MCH 27.5 26.0 - 34.0 pg   MCHC 32.7 30.0 - 36.0 g/dL   RDW 14.2 11.5 - 15.5 %   Platelets 177 150 - 400 K/uL   nRBC 0.0 0.0 - 0.2 %   Neutrophils Relative % 93 %   Neutro Abs 16.8 (H) 1.7 - 7.7 K/uL   Lymphocytes Relative 2 %   Lymphs Abs 0.3 (L) 0.7 - 4.0 K/uL   Monocytes Relative 4 %   Monocytes Absolute 0.8 0.1 - 1.0 K/uL   Eosinophils Relative 0 %   Eosinophils Absolute 0.0 0.0 - 0.5 K/uL   Basophils Relative 0 %   Basophils Absolute 0.0 0.0 - 0.1 K/uL   Immature Granulocytes 1 %   Abs Immature Granulocytes 0.12 (H) 0.00 - 0.07 K/uL    Comment: Performed at [facility], [address] [place], [place] [address]  Protime-INR     Status: None   Collection Time: 03/13/21 11:49 PM  Result Value Ref Range   Prothrombin Time 14.2 11.4 - 15.2 seconds   INR 1.1 0.8 - 1.2    Comment: (NOTE) INR goal varies based on device and disease states. Performed  at [facility], [address] [place], [place] [address]   Culture, blood (Routine x 2)     Status: None (Preliminary result)   Collection Time: 03/13/21  11:49 PM   Specimen: BLOOD  Result Value Ref Range   Specimen Description BLOOD LEFT ASSIST CONTROL    Special Requests      BOTTLES DRAWN AEROBIC AND ANAEROBIC Blood Culture adequate volume   Culture  Setup Time      Organism ID to follow GRAM POSITIVE COCCI IN BOTH AEROBIC AND ANAEROBIC BOTTLES CRITICAL RESULT CALLED TO, READ BACK BY AND VERIFIED WITH: Performed at [facility], [address] [place], [place] [address]    Culture GRAM POSITIVE COCCI    Report Status PENDING   Culture, blood (Routine x 2)     Status: None (Preliminary result)   Collection Time: 03/13/21 11:49 PM   Specimen: BLOOD  Result Value Ref Range   Specimen Description BLOOD RIGHT FOREARM    Special Requests      BOTTLES DRAWN AEROBIC AND ANAEROBIC Blood Culture adequate volume   Culture  Setup Time      GRAM POSITIVE COCCI IN BOTH AEROBIC AND ANAEROBIC BOTTLES CRITICAL RESULT CALLED TO, READ BACK BY AND VERIFIED WITH: Performed at [facility], [address] [place], [place] [address]    Culture GRAM POSITIVE COCCI    Report Status PENDING   Beta-hydroxybutyric acid     Status: Abnormal   Collection Time: 03/13/21 11:49 PM  Result Value Ref Range   Beta-Hydroxybutyric Acid 0.36 (H) 0.05 - 0.27 mmol/L    Comment: Performed at [facility], [address] [place], [place] [address]  Resp Panel by RT-PCR (Flu A&B, Covid) Nasopharyngeal Swab     Status: None   Collection Time: 03/13/21 11:49 PM   Specimen: Nasopharyngeal Swab; Nasopharyngeal(NP) swabs in vial transport medium  Result Value Ref Range   SARS Coronavirus 2 by RT PCR NEGATIVE NEGATIVE    Comment: (NOTE) SARS-CoV-2 target nucleic acids are NOT DETECTED.  The SARS-CoV-2 RNA is generally detectable in upper respiratory specimens during the  acute phase of infection. The lowest concentration of SARS-CoV-2 viral copies this assay can detect is 138 copies/mL. A negative result does not preclude SARS-Cov-2 infection and should not be used as the sole basis for treatment or other patient management decisions. A negative result may occur with  improper specimen collection/handling, submission of specimen other than nasopharyngeal swab, presence of viral mutation(s) within the areas targeted by this assay, and inadequate number of viral copies(<138 copies/mL). A negative result must be combined with clinical observations, patient history, and epidemiological information. The expected result is Negative.  Fact Sheet for Patients:  EntrepreneurPulse.com.au  Fact Sheet for Healthcare Providers:  IncredibleEmployment.be  This test is no t yet approved or cleared by the [place] FDA and  has been authorized for detection and/or diagnosis of SARS-CoV-2 by FDA under an Emergency Use Authorization (EUA). This EUA will remain  in effect (meaning this test can be used) for the duration of the COVID-19 declaration under Section 564(b)(1) of the Act, 21 U.S.C.section 360bbb-3(b)(1), unless the authorization is terminated  or revoked sooner.       Influenza A by PCR NEGATIVE NEGATIVE   Influenza B by PCR NEGATIVE NEGATIVE    Comment: (NOTE) The Xpert Xpress SARS-CoV-2/FLU/RSV plus assay is intended as an aid in the diagnosis of influenza from Nasopharyngeal swab specimens and should not be used as a sole basis for treatment. Nasal washings and aspirates are unacceptable for Xpert Xpress SARS-CoV-2/FLU/RSV testing.  Fact Sheet for Patients: EntrepreneurPulse.com.au  Fact Sheet for Healthcare Providers: IncredibleEmployment.be  This test is not  yet approved or cleared by the [place] and has been authorized for detection and/or diagnosis of  SARS-CoV-2 by FDA under an Emergency Use Authorization (EUA). This EUA will remain in effect (meaning this test can be used) for the duration of the COVID-19 declaration under Section 564(b)(1) of the Act, 21 U.S.C. section 360bbb-3(b)(1), unless the authorization is terminated or revoked.  Performed at [facility], [address] [place], [place] [address]   Magnesium     Status: None   Collection Time: 03/13/21 11:49 PM  Result Value Ref Range   Magnesium 2.0 1.7 - 2.4 mg/dL    Comment: Performed at [facility], [address] [place], [place] [address]  Lactic acid, plasma     Status: Abnormal   Collection Time: 03/13/21 11:49 PM  Result Value Ref Range   Lactic Acid, Venous 2.7 (HH) 0.5 - 1.9 mmol/L    Comment: CRITICAL VALUE NOTED. VALUE IS CONSISTENT WITH PREVIOUSLY REPORTED/CALLED VALUE DLB Performed at [facility], [address] [place], [place] [address]   Aerobic/Anaerobic Culture w Gram Stain (surgical/deep wound)     Status: None (Preliminary result)   Collection Time: 03/13/21 11:50 PM   Specimen: Ankle; Wound  Result Value Ref Range   Specimen Description      ANKLE Performed at [facility], [address] [place], [place] [address]    Special Requests      NONE Performed at [facility], [place], [place] [address]    Gram Stain      NO SQUAMOUS EPITHELIAL CELLS SEEN FEW WBC SEEN MODERATE GRAM POSITIVE COCCI Performed at [facility], [address], [place], [place] [address]    Culture PENDING    Report Status PENDING   Lactic acid, plasma     Status: None   Collection Time: 03/14/21  4:43 AM  Result Value Ref Range   Lactic Acid, Venous 1.7 0.5 - 1.9 mmol/L    Comment: Performed at [facility], [address] [place], [place] [address]  HIV Antibody (routine testing w rflx)     Status: None   Collection Time: 03/14/21  4:43 AM  Result Value Ref Range   HIV  Screen 4th Generation wRfx Non Reactive Non Reactive    Comment: Performed at [facility], [place] [address] [place], [place] [address]  Protime-INR     Status: None   Collection Time: 03/14/21  4:43 AM  Result Value Ref Range   Prothrombin Time 14.7 11.4 - 15.2 seconds   INR 1.2 0.8 - 1.2    Comment: (NOTE) INR goal varies based on device and disease states. Performed at [facility], [address] [place], [place] [address]   Cortisol-am, blood     Status: Abnormal   Collection Time: 03/14/21  4:43 AM  Result Value Ref Range   Cortisol - AM 43.7 (H) 6.7 - 22.6 ug/dL    Comment: Performed at [facility] [address], [place],  [address]  Procalcitonin     Status: None   Collection Time: 03/14/21  4:43 AM  Result Value Ref Range   Procalcitonin 4.26 ng/mL    Comment:        Interpretation: PCT > 2 ng/mL: Systemic infection (sepsis) is likely, unless other causes are known. (NOTE)       Sepsis PCT Algorithm           Lower Respiratory  Tract                                      Infection PCT Algorithm    ----------------------------     ----------------------------         PCT < 0.25 ng/mL                PCT < 0.10 ng/mL          Strongly encourage             Strongly discourage   discontinuation of antibiotics    initiation of antibiotics    ----------------------------     -----------------------------       PCT 0.25 - 0.50 ng/mL            PCT 0.10 - 0.25 ng/mL               OR       >80% decrease in PCT            Discourage initiation of                                            antibiotics      Encourage discontinuation           of antibiotics    ----------------------------     -----------------------------         PCT >= 0.50 ng/mL              PCT 0.26 - 0.50 ng/mL               AND       <80% decrease in PCT              Encourage initiation of                                             antibiotics       Encourage  continuation           of antibiotics    ----------------------------     -----------------------------        PCT >= 0.50 ng/mL                  PCT > 0.50 ng/mL               AND         increase in PCT                  Strongly encourage                                      initiation of antibiotics    Strongly encourage escalation           of antibiotics                                     -----------------------------  PCT <= 0.25 ng/mL                                                 OR                                        > 80% decrease in PCT                                      Discontinue / Do not initiate                                             antibiotics  Performed at [facility], [address] [place], [place] [address]   Basic metabolic panel     Status: Abnormal   Collection Time: 03/14/21  4:43 AM  Result Value Ref Range   Sodium 128 (L) 135 - 145 mmol/L   Potassium 4.2 3.5 - 5.1 mmol/L   Chloride 94 (L) 98 - 111 mmol/L   CO2 24 22 - 32 mmol/L   Glucose, Bld 392 (H) 70 - 99 mg/dL    Comment: Glucose reference range applies only to samples taken after fasting for at least 8 hours.   BUN 41 (H) 6 - 20 mg/dL   Creatinine, Ser 1.85 (H) 0.44 - 1.00 mg/dL   Calcium 8.0 (L) 8.9 - 10.3 mg/dL   GFR, Estimated 31 (L) >60 mL/min    Comment: (NOTE) Calculated using the CKD-EPI Creatinine Equation (2021)    Anion gap 10 5 - 15    Comment: Performed at [facility], [address] [place], [place] [address]  CBC     Status: Abnormal   Collection Time: 03/14/21  4:43 AM  Result Value Ref Range   WBC 17.7 (H) 4.0 - 10.5 K/uL   RBC 3.43 (L) 3.87 - 5.11 MIL/uL   Hemoglobin 9.5 (L) 12.0 - 15.0 g/dL   HCT 28.9 (L) 36.0 - 46.0 %   MCV 84.3 80.0 - 100.0 fL   MCH 27.7 26.0 - 34.0 pg   MCHC 32.9 30.0 - 36.0 g/dL   RDW 14.4 11.5 - 15.5 %   Platelets 154 150 - 400 K/uL   nRBC 0.0 0.0 - 0.2 %    Comment:  Performed at [facility], [address] [place], [place] [address]  CBG monitoring, ED     Status: Abnormal   Collection Time: 03/14/21  7:44 AM  Result Value Ref Range   Glucose-Capillary 377 (H) 70 - 99 mg/dL    Comment: Glucose reference range applies only to samples taken after fasting for at least 8 hours.  Lactic acid, plasma     Status: None   Collection Time: 03/14/21  7:48 AM  Result Value Ref Range   Lactic Acid, Venous 1.6 0.5 - 1.9 mmol/L    Comment: Performed at [facility], [address] [place], [place] [address]  CBG monitoring, ED     Status: Abnormal   Collection Time: 03/14/21 12:35 PM  Result Value Ref  Range   Glucose-Capillary 325 (H) 70 - 99 mg/dL    Comment: Glucose reference range applies only to samples taken after fasting for at least 8 hours.    MR ANKLE LEFT WO CONTRAST  Result Date: 03/14/2021 CLINICAL DATA:  Osteomyelitis EXAM: MRI OF THE LEFT ANKLE WITHOUT CONTRAST TECHNIQUE: Multiplanar, multisequence MR imaging of the ankle was performed. No intravenous contrast was administered. COMPARISON:  Same-day foot radiograph FINDINGS: Bones/Joint/Cartilage There is subtalar malalignment with medially rotated talus consistent with Charcot arthropathy. Subtalar joint effusion. There is bony edema and some low T1 signal in the medial talar head. There is additional periarticular bony edema throughout the hindfoot and midfoot favored to be related to neuropathic arthropathy. Subacute nondisplaced fracture of the talar dome, vertically oriented, extending to the tibiotalar and subtalar joints (sagittal T1 image 10, coronal T2 image 20-22. Mild tibiotalar chondrosis. Ligaments Chronic ATFL sprain/partial tear. the PTFL is intact. No structure resembling the calcaneofibular ligament is seen. Chronic deltoid and spring ligament tearing. These findings are consistent with Charcot arthropathy. Muscles and Tendons There is tenosynovitis of the peroneal  tendons above and below the lateral malleolus. There is tenosynovitis and tendinosis of the posterior tibial tendon above and below the medial malleolus. There is thickening of the central bundle plantar fascia with mildly increased signal. The Achilles tendon is intact. Soft tissue There is a soft tissue ulcer along the medial midfoot with underlying signal abnormality in the talar head. Extensive soft tissue swelling. IMPRESSION: Soft tissue ulcer along the medial midfoot with underlying probable osteomyelitis in the medial talar head. Charcot arthropathy with subtalar malalignment and associated periarticular marrow edema. Subacute nondisplaced vertically oriented talar dome fracture. Large subtalar joint effusion, likely related to chronic joint malalignment/neuropathic arthropathy and the talar fracture. Sterility is indeterminate on MRI. Tenosynovitis of the posterior tibial tendon above and below the medial malleolus. Tenosynovitis of the peroneal tendons above and below the lateral malleolus. Electronically Signed   By: Maurine Simmering M.D.   On: 03/14/2021 12:08   DG Chest Port 1 View  Result Date: 03/14/2021 CLINICAL DATA:  Possible sepsis EXAM: PORTABLE CHEST 1 VIEW COMPARISON:  None. FINDINGS: Cardiac shadow is within normal limits. The lungs are hypoinflated with mild right basilar atelectasis. Elevation of the right hemidiaphragm is noted. Bony structures are within normal limits. IMPRESSION: Mild right basilar atelectasis. Electronically Signed   By: Inez Catalina M.D.   On: 03/14/2021 00:40   DG Foot 2 Views Left  Addendum Date: 03/14/2021   ADDENDUM REPORT: 03/14/2021 00:46 ADDENDUM: Additionally along the medial aspect of the talus there is some suggestion of bony resorption which given the known overlying soft tissue wound may be related to osteomyelitis. MRI may be helpful for further evaluation. Electronically Signed   By: Inez Catalina M.D.   On: 03/14/2021 00:46   Result Date:  03/14/2021 CLINICAL DATA:  Possible sepsis, initial encounter EXAM: LEFT FOOT - 2 VIEW COMPARISON:  None. FINDINGS: Postsurgical changes are noted in the first metatarsal. Degenerative changes in the first MTP joint are seen. Some widening of the talonavicular joint is seen with flattening of the plantar arch identified. These are likely related to more chronic changes possibly related to Charcot deformity. No acute fracture is seen. Mild soft tissue swelling is noted distally. IMPRESSION: Chronic appearing changes particularly at the talonavicular joint suggestive of early Charcot joint changes. No acute fracture is noted. Electronically Signed: By: Inez Catalina M.D. On: 03/14/2021 00:42    Review of Systems  Constitutional:  Negative for chills and fever.  HENT:  Negative for sinus pain and sore throat.   Respiratory:  Negative for cough and shortness of breath.   Cardiovascular:  Negative for chest pain and palpitations.  Gastrointestinal:  Positive for nausea and vomiting.  Endocrine: Negative for polydipsia and polyuria.  Genitourinary:  Negative for frequency and urgency.  Musculoskeletal:        Patient relates chronic foot deformity on the left foot.  Skin:        Recent increased redness and drainage from chronic ulceration on her left foot.  Neurological:        Patient relates significant neuropathy associated with her diabetes.  Psychiatric/Behavioral:  Negative for confusion. The patient is not nervous/anxious.   Blood pressure 105/84, pulse (!) 134, temperature 99.1 F (37.3 C), temperature source Oral, resp. rate (!) 26, height _0  (1.676 m), weight 104.3 kg, SpO2 96 %. Physical Exam Cardiovascular:     Comments: Pedal pulses are palpable. Musculoskeletal:     Comments: Chronic Charcot deformity with flattening of the arch and eversion of the forefoot on the left.  Skin:    Comments: The skin is warm dry and somewhat atrophic.  Large full-thickness ulceration with central  fibrotic tissue measuring approximately 6 cm x 3 cm probing down to the level of bone and subtalar joint with heavy purulence expressed from the medial rear foot.  Neurological:     Comments: Loss of sensation distally in the feet and toes.     Assessment/Plan: Assessment: 1.  Chronic Charcot left foot. 2.  Septic subtalar joint. 3.  Early osteomyelitis talus. 4.  Diabetes with associated neuropathy.  Plan: Discussed with the patient and her wife that at this point I do not think that her foot is salvageable.  Discussed with the patient that her subtalar joint is clearly infected and most likely has early bone infection and that due to her Charcot deformity that I believe her only option is higher amputation.  Discussed that she will need a more proximal amputation.  Vascular consult was placed.  Bulky bandage reapplied to the left foot and ankle.  Podiatry will sign off at this point.  Durward Fortes 03/14/2021, 12:47 PM

## 2021-03-14 NOTE — ED Notes (Signed)
Podiatrist at bedside.

## 2021-03-14 NOTE — ED Notes (Signed)
Pt c/o shivering- rechecked temp- increased to 99.1. Pt also c/o nausea and was dry heaving. Said droperidol helped most but cannot have yet at this time- Is eligible for zofran and will administer.

## 2021-03-14 NOTE — ED Provider Notes (Signed)
[facility] Emergency Department Provider Note  ____________________________________________   Event Date/Time   First MD Initiated Contact with Patient 03/13/21 2340     (approximate)  I have reviewed the triage vital signs and the nursing notes.   HISTORY  Chief Complaint Nausea    HPI Rebecca Park is a 60 y.o. female with medical history as listed below which notably includes type 2 diabetes and Charcot foot of the left side with a wound that is being treated by Dr. Luana Shu with podiatry.  She presents by EMS for severe vomiting and persistent nausea.  Symptoms have been most notable all day today and she was not able to go to her podiatry appointment that was scheduled for today, but she started to feel bad within the last couple of days.  She has not had much to eat or drink today.  She has vomited multiple times and the nausea is severe.  She has no pain.  She does not feel short of breath although she is breathing somewhat rapidly.  She denies fever, sore throat, chest pain, abdominal pain, and dysuria.  She has been changing the outer bandage on her left foot splint but she has not changed the inner dressing for about 10 days and as previously stated she was going to go to the podiatrist today but felt too ill to do so.  The symptoms are severe and nothing in particular makes them better or worse.     Past Medical History:  Diagnosis Date   Diabetes mellitus without complication ([place])    Hypertension    PONV (postoperative nausea and vomiting)    the day after rt 5th toe amputation surgery    Patient Active Problem List   Diagnosis Date Noted   Status post amputation of toe of right foot ([place]) 04/08/2020   HLD (hyperlipidemia) 09/17/2018   Diabetes mellitus ([place]) 03/27/2018   White coat syndrome with diagnosis of hypertension 03/27/2018    Past Surgical History:  Procedure Laterality Date   AMPUTATION Right 11/10/2018   Procedure: AMPUTATION  RAY (amputation 5th metatarsal Right foot;  Surgeon: Caroline More, DPM;  Location: [facility];  Service: Podiatry;  Laterality: Right;   AMPUTATION TOE Right 12/05/2018   Procedure: RAY RIGHT 4TH AMPUTATION;  Surgeon: Caroline More, DPM;  Location: [facility];  Service: Podiatry;  Laterality: Right;  mac with local DIABETIC-oral meds   BONE BIOPSY Right 11/10/2018   Procedure: BONE BIOPSY- Right 4th toe;  Surgeon: Caroline More, DPM;  Location: [facility];  Service: Podiatry;  Laterality: Right;   BUNIONECTOMY Bilateral    COLONOSCOPY WITH PROPOFOL N/A 09/07/2018   Procedure: COLONOSCOPY WITH PROPOFOL;  Surgeon: Lin Landsman, MD;  Location: [facility] ENDOSCOPY;  Service: Gastroenterology;  Laterality: N/A;   COLONOSCOPY WITH PROPOFOL N/A 09/17/2019   Procedure: COLONOSCOPY WITH PROPOFOL;  Surgeon: Lin Landsman, MD;  Location: [facility] ENDOSCOPY;  Service: Gastroenterology;  Laterality: N/A;    Prior to Admission medications   Medication Sig Start Date End Date Taking? Authorizing Provider  amLODipine (NORVASC) 5 MG tablet TAKE 1 TABLET BY MOUTH EVERY DAY 09/12/20   Dutch Quint B, FNP  atorvastatin (LIPITOR) 40 MG tablet TAKE 1 TABLET BY MOUTH EVERY DAY 07/02/20   Jearld Fenton, NP  glipiZIDE (GLUCOTROL) 10 MG tablet TAKE 1 TABLET (10 MG TOTAL) BY MOUTH 2 (TWO) TIMES DAILY BEFORE A MEAL. 06/27/20   Jearld Fenton, NP  Lancets 30G MISC 1 each by Does not  apply route 2 (two) times daily. 01/18/19   Jearld Fenton, NP  losartan (COZAAR) 100 MG tablet Take 100 mg by mouth daily. 02/16/21   [provider]  Multiple Vitamins-Minerals (WOMENS MULTIVITAMIN PO) Take 1 tablet by mouth daily.     [provider]  ONETOUCH ULTRA test strip 1 EACH BY OTHER ROUTE 2 (TWO) TIMES DAILY. 07/11/19   Jearld Fenton, NP  torsemide (DEMADEX) 20 MG tablet TAKE 1 TABLET BY MOUTH EVERY DAY 09/12/20   Dutch Quint B, FNP  TRULICITY 1.5 YW/7.3XT SOPN Inject 0.5 mLs into the skin once a week.  02/16/21   [provider]    Allergies Patient has no known allergies.  Family History  Problem Relation Age of Onset   Heart disease Father    Heart attack Father    Heart disease Maternal Grandfather    Breast cancer Neg Hx     Social History Social History   Tobacco Use   Smoking status: Never   Smokeless tobacco: Never  Vaping Use   Vaping Use: Never used  Substance Use Topics   Alcohol use: Not Currently    Comment: recently quit 01/2018, 3-4 beers QD   Drug use: Never    Review of Systems Constitutional: No fever/chills.  Positive for general malaise. Eyes: No visual changes. ENT: No sore throat. Cardiovascular: Denies chest pain. Respiratory: Denies shortness of breath. Gastrointestinal: Positive for nausea and vomiting.  Negative for abdominal pain. Genitourinary: Negative for dysuria. Musculoskeletal: Chronic issues with left foot, under the care of of Dr. Luana Shu with podiatry. Integumentary: Chronic left foot wound with dressing and splint. Neurological: Negative for headaches, focal weakness or numbness.   ____________________________________________   PHYSICAL EXAM:  VITAL SIGNS: ED Triage Vitals  Enc Vitals Group     BP 03/13/21 2337 102/67     Pulse Rate 03/13/21 2337 (!) 103     Resp 03/13/21 2337 17     Temp 03/13/21 2337 98.1 F (36.7 C)     Temp Source 03/13/21 2337 Oral     SpO2 03/13/21 2337 96 %     Weight 03/13/21 2339 104.3 kg (230 lb)     Height 03/13/21 2339 1.676 m (_0 )     Head Circumference --      Peak Flow --      Pain Score 03/13/21 2338 0     Pain Loc --      Pain Edu? --      Excl. in [place]? --     Constitutional: Alert and oriented.  Appears uncomfortable. Eyes: Conjunctivae are normal.  Head: Atraumatic. Nose: No congestion/rhinnorhea. Mouth/Throat: Patient is wearing a mask. Neck: No stridor.  No meningeal signs.   Cardiovascular: Tachycardia, regular rhythm. Good peripheral circulation. Respiratory:  Normal respiratory effort though with increased respiratory rate.  No wheezing. Gastrointestinal: Obese.  Soft and nontender. No distention.  Musculoskeletal: Chronic deformity of left foot consistent with Charcot foot.  Purulent foot wound with surrounding cellulitis present on the medial malleolus further described and presented below with photograph. Neurologic:  Normal speech and language. No gross focal neurologic deficits are appreciated.  Skin:  Skin is warm and dry.  Chronic but purulent wound on the left medial malleolus that appears acutely infected in addition to the chronic nature of the wound.  She has some chronic neuropathy and the wound is nontender.  Compartments are soft and compressible.  No concern for necrotizing fasciitis.      ____________________________________________  LABS (all labs ordered are listed, but only abnormal results are displayed)  Labs Reviewed  COMPREHENSIVE METABOLIC PANEL - Abnormal; Notable for the following components:      Result Value   Sodium 128 (*)    Chloride 92 (*)    Glucose, Bld 390 (*)    BUN 44 (*)    Creatinine, Ser 2.15 (*)    Calcium 8.4 (*)    Albumin 2.7 (*)    Alkaline Phosphatase 213 (*)    Total Bilirubin 1.9 (*)    GFR, Estimated 26 (*)    All other components within normal limits  LACTIC ACID, PLASMA - Abnormal; Notable for the following components:   Lactic Acid, Venous 3.1 (*)    All other components within normal limits  CBC WITH DIFFERENTIAL/PLATELET - Abnormal; Notable for the following components:   WBC 18.1 (*)    RBC 3.82 (*)    Hemoglobin 10.5 (*)    HCT 32.1 (*)    Neutro Abs 16.8 (*)    Lymphs Abs 0.3 (*)    Abs Immature Granulocytes 0.12 (*)    All other components within normal limits  BETA-HYDROXYBUTYRIC ACID - Abnormal; Notable for the following components:   Beta-Hydroxybutyric Acid 0.36 (*)    All other components within normal limits  RESP PANEL BY RT-PCR (FLU A&B, COVID) ARPGX2  CULTURE,  BLOOD (ROUTINE X 2)  CULTURE, BLOOD (ROUTINE X 2)  URINE CULTURE  AEROBIC/ANAEROBIC CULTURE W GRAM STAIN (SURGICAL/DEEP WOUND)  PROTIME-INR  MAGNESIUM  URINALYSIS, ROUTINE W REFLEX MICROSCOPIC  LACTIC ACID, PLASMA  LACTIC ACID, PLASMA  POC URINE PREG, ED   ____________________________________________  EKG  ED ECG REPORT I, Hinda Kehr, the attending physician, personally viewed and interpreted this ECG.  Date: 03/14/2021 EKG Time: 00: 45 Rate: 101 Rhythm: Sinus tachycardia QRS Axis: normal Intervals: normal including QTC of 427 ms. ST/T Wave abnormalities: Nonspecific changes but no clear evidence of ischemia Narrative Interpretation: no evidence of acute ischemia  ____________________________________________  RADIOLOGY I, Hinda Kehr, personally viewed and evaluated these images (plain radiographs) as part of my medical decision making, as well as reviewing the written report by the radiologist.  ED MD interpretation: Right basilar atelectasis, no obvious lobar pneumonia on chest x-ray.  Questionable osteomyelitis on foot x-rays.  Official radiology report(s): DG Chest Port 1 View  Result Date: 03/14/2021 CLINICAL DATA:  Possible sepsis EXAM: PORTABLE CHEST 1 VIEW COMPARISON:  None. FINDINGS: Cardiac shadow is within normal limits. The lungs are hypoinflated with mild right basilar atelectasis. Elevation of the right hemidiaphragm is noted. Bony structures are within normal limits. IMPRESSION: Mild right basilar atelectasis. Electronically Signed   By: Inez Catalina M.D.   On: 03/14/2021 00:40   DG Foot 2 Views Left  Addendum Date: 03/14/2021   ADDENDUM REPORT: 03/14/2021 00:46 ADDENDUM: Additionally along the medial aspect of the talus there is some suggestion of bony resorption which given the known overlying soft tissue wound may be related to osteomyelitis. MRI may be helpful for further evaluation. Electronically Signed   By: Inez Catalina M.D.   On: 03/14/2021 00:46    Result Date: 03/14/2021 CLINICAL DATA:  Possible sepsis, initial encounter EXAM: LEFT FOOT - 2 VIEW COMPARISON:  None. FINDINGS: Postsurgical changes are noted in the first metatarsal. Degenerative changes in the first MTP joint are seen. Some widening of the talonavicular joint is seen with flattening of the plantar arch identified. These are likely related to more chronic changes possibly related to Charcot  deformity. No acute fracture is seen. Mild soft tissue swelling is noted distally. IMPRESSION: Chronic appearing changes particularly at the talonavicular joint suggestive of early Charcot joint changes. No acute fracture is noted. Electronically Signed: By: Inez Catalina M.D. On: 03/14/2021 00:42    ____________________________________________   PROCEDURES   Procedure(s) performed (including Critical Care):  .1-3 Lead EKG Interpretation Performed by: Hinda Kehr, MD Authorized by: Hinda Kehr, MD     Interpretation: abnormal     ECG rate:  103   ECG rate assessment: tachycardic     Rhythm: sinus tachycardia     Ectopy: none     Conduction: normal   .Critical Care Performed by: Hinda Kehr, MD Authorized by: Hinda Kehr, MD   Critical care provider statement:    Critical care time (minutes):  40   Critical care time was exclusive of:  Separately billable procedures and treating other patients   Critical care was necessary to treat or prevent imminent or life-threatening deterioration of the following conditions:  Sepsis   Critical care was time spent personally by me on the following activities:  Development of treatment plan with patient or surrogate, evaluation of patient's response to treatment, examination of patient, obtaining history from patient or surrogate, ordering and performing treatments and interventions, ordering and review of laboratory studies, ordering and review of radiographic studies, pulse oximetry, re-evaluation of patient's condition and review of  old charts   ____________________________________________   INITIAL IMPRESSION / MDM / [place] / ED COURSE  As part of my medical decision making, I reviewed the following data within the electronic MEDICAL RECORD NUMBER Notes from prior ED visits and [place] Controlled Substance Database   Differential diagnosis includes, but is not limited to, sepsis, cellulitis, osteomyelitis, DKA, HHS, metabolic or electrolyte abnormality.  The patient is on the cardiac monitor to evaluate for evidence of arrhythmia and/or significant heart rate changes.  Initiating "possible sepsis" orders but also ordering beta hydroxybutyric acid anticipating the patient might be in DKA.  Based on the appearance of her left foot/ankle, I suspect this is the source of infection.  I will get back some basic labs before starting antibiotics but anticipate antibiotics and admission.  Ordering x-rays of the foot as well as a chest x-ray as per the sepsis protocol.  The patient received 500 mL of normal saline IV bolus by EMS.  Her blood pressure was a little bit on the low side but not profoundly hypotensive.  She is afebrile but tachycardic and tachypneic although she is not reporting any respiratory symptoms.  She is alert and oriented with no signs of altered mental status.     Clinical Course as of 03/14/21 0103  Sat Mar 14, 2021  0026 CBC with Differential(!) CBC is notable for a leukocytosis of 18.1.  This, combined with her tachycardia and tachypnea as well as mild hypotension is concerning for sepsis and I have activated the full sepsis protocol including empiric antibiotics. [CF]  0027 Her CMP is notable for hyperglycemia and acute kidney injury with a creatinine of 2.15 when normally her creatinine is within normal limits.  Her GFR is down to 26.  Reassuringly her anion gap is normal.  As previously documented, the patient received 500 mL of normal saline by EMS.  I then ordered 1 L LR IV bolus upon arrival.  I  have ordered an additional 500 mL of normal saline to bring her to a total of 2 L of crystalloid IV bolus.  This is more than required by the 30 mL/kg IV fluid bolus based on ideal body weight. [CF]  0032 For empiric antibiotics I have chosen to treat with cefepime 2 g IV and vancomycin per pharmacy consult.  I will hold off on Flagyl at this time but the cefepime should provide adequate broad-spectrum coverage with the addition of the vancomycin for MRSA coverage. [CF]  0045 DG Foot 2 Views Left [CF]  0045 DG Foot 2 Views Left I personally reviewed the patient's imaging and agree with the radiologist's interpretation that there is no obvious osteomyelitis on the foot x-rays, although Dr. Yehuda Mao added an addendum suggesting the possibility of osteomyelitis.  I will order an MRI of the foot for further evaluation though the patient will need admission regardless.  There is no obvious pneumonia on the chest x-rays although there is some mild basilar atelectasis. [CF]  [phone] Patient still very nauseated after 2 separate doses of 4 mg of Zofran.  I obtained an EKG and verified that her QTC is 427 ms.  I will proceed with droperidol 2.5 mg IV.  I also consulted the hospitalist for admission. [CF]  0048 Beta-Hydroxybutyric Acid(!): 0.36 Mildly elevated beta hydroxybutyric acid, but in the setting of a normal anion gap, not cause for concern or aggressive intervention other than the fluid resuscitation currently underway. [CF]  [phone] Received call from pharmacy that the patient's lactic acid is 3.1, further consistent with sepsis diagnosis. [CF]  [phone] Discussed case with Dr. Sidney Ace with the hospitalist service and he will admit. [CF]  0101 I discussed with Dr. Sidney Ace the patient's need for an MRI of the foot, and he said he will order the study with his admission orders. [CF]    Clinical Course User Index [CF] Hinda Kehr, MD     ____________________________________________  FINAL CLINICAL IMPRESSION(S) /  ED DIAGNOSES  Final diagnoses:  Sepsis with acute renal failure without septic shock, due to unspecified organism, unspecified acute renal failure type [facility])     MEDICATIONS GIVEN DURING THIS VISIT:  Medications  sodium chloride 0.9 % bolus 500 mL (500 mLs Intravenous New Bag/Given 03/14/21 0049)  ceFEPIme (MAXIPIME) 2 g in sodium chloride 0.9 % 100 mL IVPB (2 g Intravenous New Bag/Given 03/14/21 0047)  vancomycin (VANCOREADY) IVPB 2000 mg/400 mL (2,000 mg Intravenous New Bag/Given 03/14/21 0051)  lactated ringers bolus 1,000 mL (1,000 mLs Intravenous New Bag/Given 03/14/21 0009)  ondansetron (ZOFRAN) injection 4 mg (4 mg Intravenous Given 03/14/21 0010)  droperidol (INAPSINE) 2.5 MG/ML injection 2.5 mg (2.5 mg Intravenous Given 03/14/21 0058)     ED Discharge Orders     None        Note:  This document was prepared using Dragon voice recognition software and may include unintentional dictation errors.   Hinda Kehr, MD 03/14/21 [phone]

## 2021-03-14 NOTE — ED Notes (Signed)
Pt had not urinated in purewick- verified suction working and her brief was dry. Bladder scanned pt and has 607 ml in bladder. Will get assistance for pt to use bedside commode for urination and a sample collection.

## 2021-03-14 NOTE — ED Notes (Signed)
Informed RN bed assigned 

## 2021-03-14 NOTE — ED Notes (Signed)
Messaged MD re: patient's continued nausea. Only mild improvement with the Zofran earlier. Per pt, Droperidol worked quickly and most effectively for the nausea. Relayed to MD. Per MD try Phenergan as well as give two 1 L LR boluses.

## 2021-03-14 NOTE — Progress Notes (Signed)
Code Sepsis tracking by eLINK 

## 2021-03-14 NOTE — H&P (Signed)
[place]   PATIENT NAME: Rebecca Park    MR#:  [id number]  DATE OF BIRTH:  Jun 19, 1960  DATE OF ADMISSION:  03/13/2021  PRIMARY CARE PHYSICIAN: Marinda Elk, MD   Patient is coming from: Home  REQUESTING/REFERRING PHYSICIAN: Hinda Kehr, MD  CHIEF COMPLAINT:   Chief Complaint  Patient presents with   Nausea    HISTORY OF PRESENT ILLNESS:  Rebecca Park is a 60 y.o. Caucasian female with medical history significant for type 2 diabetes mellitus, hypertension and left Charcot foot, who presented to emergency room with acute onset of nausea and vomiting with associated hyperglycemia.  She was having malaise and fatigue.  She has been followed by Dr. Luana Shu with podiatry for left foot wound on the medial malleolus.  She denies any significant pain there.  The left foot has been splinted and she has not changed her in her dressing for about 10 days.  When removed by Dr. Karma Greaser in the ER that revealed infected wound with purulent drainage and surrounding erythema.  She denies any fever or chills.  No abdominal pain or melena or bright red blood per rectum.  She denies any bilious vomitus or hematemesis.  ED Course: When she came to the ER heart rate was 103 with otherwise normal vital signs.  Labs revealed hyponatremia and hypochloremia and hyperglycemia of 390 with a BUN of 44 and creatinine of 2.15 with albumin of 2.7 and alk phos of 213.  Total bili was 1.9.  Lactic acid was 2.7 and later 1.7.  CBC showed leukocytosis of 18.1 with neutrophilia and anemia and follows antigens and COVID-19 PCR came back negative.  Blood cultures were drawn.  EKG as reviewed by me : EKG showed sinus tachycardia with rate  with inferior Q waves.100 Imaging: Chest x-ray showed right mild basilar atelectasis.  2 view foot x-ray showed chronic appearing changes particularly in the talonavicular joint suggestive of early Charcot joint changes with no acute fracture.  The patient was given IV  cefepime and vancomycin, 1 L bolus of IV lactated Ringer and 500 mL IV normal saline, 2.5 mg of IV Inapsine and 4 mg of IV Zofran.  She will be admitted to a medical telemetry bed for further evaluation and management. PAST MEDICAL HISTORY:   Past Medical History:  Diagnosis Date   Diabetes mellitus without complication ([place])    Hypertension    PONV (postoperative nausea and vomiting)    the day after rt 5th toe amputation surgery    PAST SURGICAL HISTORY:   Past Surgical History:  Procedure Laterality Date   AMPUTATION Right 11/10/2018   Procedure: AMPUTATION RAY (amputation 5th metatarsal Right foot;  Surgeon: Caroline More, DPM;  Location: [facility];  Service: Podiatry;  Laterality: Right;   AMPUTATION TOE Right 12/05/2018   Procedure: RAY RIGHT 4TH AMPUTATION;  Surgeon: Caroline More, DPM;  Location: [place];  Service: Podiatry;  Laterality: Right;  mac with local DIABETIC-oral meds   BONE BIOPSY Right 11/10/2018   Procedure: BONE BIOPSY- Right 4th toe;  Surgeon: Caroline More, DPM;  Location: [facility];  Service: Podiatry;  Laterality: Right;   BUNIONECTOMY Bilateral    COLONOSCOPY WITH PROPOFOL N/A 09/07/2018   Procedure: COLONOSCOPY WITH PROPOFOL;  Surgeon: Lin Landsman, MD;  Location: [facility];  Service: Gastroenterology;  Laterality: N/A;   COLONOSCOPY WITH PROPOFOL N/A 09/17/2019   Procedure: COLONOSCOPY WITH PROPOFOL;  Surgeon: Lin Landsman, MD;  Location: [place];  Service:  Gastroenterology;  Laterality: N/A;    SOCIAL HISTORY:   Social History   Tobacco Use   Smoking status: Never   Smokeless tobacco: Never  Substance Use Topics   Alcohol use: Not Currently    Comment: recently quit 01/2018, 3-4 beers QD    FAMILY HISTORY:   Family History  Problem Relation Age of Onset   Heart disease Father    Heart attack Father    Heart disease Maternal Grandfather    Breast cancer Neg Hx     DRUG ALLERGIES:  No Known  Allergies  REVIEW OF SYSTEMS:   ROS As per history of present illness. All pertinent systems were reviewed above. Constitutional, HEENT, cardiovascular, respiratory, GI, GU, musculoskeletal, neuro, psychiatric, endocrine, integumentary and hematologic systems were reviewed and are otherwise negative/unremarkable except for positive findings mentioned above in the HPI.   MEDICATIONS AT HOME:   Prior to Admission medications   Medication Sig Start Date End Date Taking? Authorizing Provider  amLODipine (NORVASC) 5 MG tablet TAKE 1 TABLET BY MOUTH EVERY DAY 09/12/20  Yes Dutch Quint B, FNP  atorvastatin (LIPITOR) 40 MG tablet TAKE 1 TABLET BY MOUTH EVERY DAY 07/02/20  Yes Baity, Coralie Keens, NP  glipiZIDE (GLUCOTROL) 10 MG tablet TAKE 1 TABLET (10 MG TOTAL) BY MOUTH 2 (TWO) TIMES DAILY BEFORE A MEAL. 06/27/20  Yes Jearld Fenton, NP  Lancets 30G MISC 1 each by Does not apply route 2 (two) times daily. 01/18/19  Yes Jearld Fenton, NP  losartan (COZAAR) 100 MG tablet Take 100 mg by mouth daily. 02/16/21  Yes [provider]  Multiple Vitamins-Minerals (WOMENS MULTIVITAMIN PO) Take 1 tablet by mouth daily.    Yes [provider]  ONETOUCH ULTRA test strip 1 EACH BY OTHER ROUTE 2 (TWO) TIMES DAILY. 07/11/19  Yes Jearld Fenton, NP  SANTYL ointment Apply 1 application topically daily as needed. 01/19/21  Yes [provider]  torsemide (DEMADEX) 20 MG tablet TAKE 1 TABLET BY MOUTH EVERY DAY 09/12/20  Yes Dutch Quint B, FNP  TRULICITY 1.5 NL/8.9QJ SOPN Inject 0.5 mLs into the skin once a week. 02/16/21  Yes [provider]      VITAL SIGNS:  Blood pressure 96/62, pulse 100, temperature 98.1 F (36.7 C), temperature source Oral, resp. rate (!) 26, height _0  (1.676 m), weight 104.3 kg, SpO2 96 %.  PHYSICAL EXAMINATION:  Physical Exam  GENERAL:  60 y.o.-year-old Caucasian female patient lying in the bed with no acute distress.  EYES: Pupils equal, round,  reactive to light and accommodation. No scleral icterus. Extraocular muscles intact.  HEENT: Head atraumatic, normocephalic. Oropharynx and nasopharynx clear.  NECK:  Supple, no jugular venous distention. No thyroid enlargement, no tenderness.  LUNGS: Normal breath sounds bilaterally, no wheezing, rales,rhonchi or crepitation. No use of accessory muscles of respiration.  CARDIOVASCULAR: Regular rate and rhythm, S1, S2 normal. No murmurs, rubs, or gallops.  ABDOMEN: Soft, nondistended, nontender. Bowel sounds present. No organomegaly or mass.  EXTREMITIES: No lower extremity l edema, cyanosis, or clubbing.  NEUROLOGIC: Cranial nerves II through XII are intact. Muscle strength 5/5 in all extremities. Sensation intact. Gait not checked.  PSYCHIATRIC: The patient is alert and oriented x 3.  Normal affect and good eye contact. SKIN: Left medial ankle infected wound with surrounding erythema and purulent drainage in a chart right foot.    LABORATORY PANEL:   CBC Recent Labs  Lab 03/13/21 2349  WBC 18.1*  HGB 10.5*  HCT 32.1*  PLT 177   ------------------------------------------------------------------------------------------------------------------  Chemistries  Recent Labs  Lab 03/13/21 2349  NA 128*  K 3.8  CL 92*  CO2 24  GLUCOSE 390*  BUN 44*  CREATININE 2.15*  CALCIUM 8.4*  MG 2.0  AST 25  ALT 23  ALKPHOS 213*  BILITOT 1.9*   ------------------------------------------------------------------------------------------------------------------  Cardiac Enzymes No results for input(s): TROPONINI in the last 168 hours. ------------------------------------------------------------------------------------------------------------------  RADIOLOGY:  DG Chest Port 1 View  Result Date: 03/14/2021 CLINICAL DATA:  Possible sepsis EXAM: PORTABLE CHEST 1 VIEW COMPARISON:  None. FINDINGS: Cardiac shadow is within normal limits. The lungs are hypoinflated with mild right basilar  atelectasis. Elevation of the right hemidiaphragm is noted. Bony structures are within normal limits. IMPRESSION: Mild right basilar atelectasis. Electronically Signed   By: Inez Catalina M.D.   On: 03/14/2021 00:40   DG Foot 2 Views Left  Addendum Date: 03/14/2021   ADDENDUM REPORT: 03/14/2021 00:46 ADDENDUM: Additionally along the medial aspect of the talus there is some suggestion of bony resorption which given the known overlying soft tissue wound may be related to osteomyelitis. MRI may be helpful for further evaluation. Electronically Signed   By: Inez Catalina M.D.   On: 03/14/2021 00:46   Result Date: 03/14/2021 CLINICAL DATA:  Possible sepsis, initial encounter EXAM: LEFT FOOT - 2 VIEW COMPARISON:  None. FINDINGS: Postsurgical changes are noted in the first metatarsal. Degenerative changes in the first MTP joint are seen. Some widening of the talonavicular joint is seen with flattening of the plantar arch identified. These are likely related to more chronic changes possibly related to Charcot deformity. No acute fracture is seen. Mild soft tissue swelling is noted distally. IMPRESSION: Chronic appearing changes particularly at the talonavicular joint suggestive of early Charcot joint changes. No acute fracture is noted. Electronically Signed: By: Inez Catalina M.D. On: 03/14/2021 00:42      IMPRESSION AND PLAN:  Active Problems:   * No active hospital problems. *  1.  Left medial malleolus severe nonpurulent cellulitis due to infected wound. - The patient will be admitted to a medical telemetry bed. - We will continue antibiotic therapy with IV cefepime and vancomycin. - Pain management will be provided. - Podiatry consult to be obtained - I notified Dr. Cleda Mccreedy about the patient.  2.  Sepsis secondary cellulitis. - Blood cultures as well as wound gram stain culture and sensitivity will be obtained. - The patient will be hydrated with IV normal saline. - Management otherwise as  above.  3.  Dyslipidemia. - We will continue statin therapy.  4.  Type 2 diabetes mellitus. - We will continue her Glucotrol and place her on supplement coverage with NovoLog.  5.  Essential hypertension. - Continue Norvasc.   DVT prophylaxis: Lovenox. Code Status: full code. Family Communication:  The plan of care was discussed in details with the patient (and family). I answered all questions. The patient agreed to proceed with the above mentioned plan. Further management will depend upon hospital course. Disposition Plan: Back to previous home environment Consults called: Podiatry all the records are reviewed and case discussed with ED provider.  Status is: Inpatient   Remains inpatient appropriate because:Ongoing diagnostic testing needed not appropriate for outpatient work up, Unsafe d/c plan, IV treatments appropriate due to intensity of illness or inability to take PO, and Inpatient level of care appropriate due to severity of illness   Dispo: The patient is from: Home  Anticipated d/c is to: Home              Patient currently is not medically stable to d/c.              Difficult to place patient: No     Christel Mormon M.D on 03/14/2021 at 1:26 AM  [facility]   From 7 PM-7 AM, contact night-coverage [email or web address]  CC: Primary care physician; Marinda Elk, MD

## 2021-03-14 NOTE — ED Notes (Signed)
Pt not in room and at MRI when went in to evaluate nausea, give PO medication and IV antibiotics.

## 2021-03-14 NOTE — Progress Notes (Addendum)
PHARMACY - PHYSICIAN COMMUNICATION CRITICAL VALUE ALERT - BLOOD CULTURE IDENTIFICATION (BCID)  Rebecca Park is an 60 y.o. female who presented to [facility] on 03/13/2021 with a chief complaint of DFI / osteomyelitis  Assessment:  4/4 bottles GPC. BCID detected MSSA. Suspect DFI / OM as source  Name of physician contacted: Dr. Sloan Leiter  Current antibiotics: Cefepime + vancomycin + Flagyl  Changes to prescribed antibiotics recommended:  --Recommended narrowing antibiotics to cefazolin monotherapy --Automatic ID consult for SAB  Results for orders placed or performed during the hospital encounter of 03/13/21  Blood Culture ID Panel (Reflexed) (Collected: 03/13/2021 11:49 PM)  Result Value Ref Range   Enterococcus faecalis NOT DETECTED NOT DETECTED   Enterococcus Faecium NOT DETECTED NOT DETECTED   Listeria monocytogenes NOT DETECTED NOT DETECTED   Staphylococcus species DETECTED (A) NOT DETECTED   Staphylococcus aureus (BCID) DETECTED (A) NOT DETECTED   Staphylococcus epidermidis NOT DETECTED NOT DETECTED   Staphylococcus lugdunensis NOT DETECTED NOT DETECTED   Streptococcus species NOT DETECTED NOT DETECTED   Streptococcus agalactiae NOT DETECTED NOT DETECTED   Streptococcus pneumoniae NOT DETECTED NOT DETECTED   Streptococcus pyogenes NOT DETECTED NOT DETECTED   A.calcoaceticus-baumannii NOT DETECTED NOT DETECTED   Bacteroides fragilis NOT DETECTED NOT DETECTED   Enterobacterales NOT DETECTED NOT DETECTED   Enterobacter cloacae complex NOT DETECTED NOT DETECTED   Escherichia coli NOT DETECTED NOT DETECTED   Klebsiella aerogenes NOT DETECTED NOT DETECTED   Klebsiella oxytoca NOT DETECTED NOT DETECTED   Klebsiella pneumoniae NOT DETECTED NOT DETECTED   Proteus species NOT DETECTED NOT DETECTED   Salmonella species NOT DETECTED NOT DETECTED   Serratia marcescens NOT DETECTED NOT DETECTED   Haemophilus influenzae NOT DETECTED NOT DETECTED   Neisseria meningitidis NOT DETECTED  NOT DETECTED   Pseudomonas aeruginosa NOT DETECTED NOT DETECTED   Stenotrophomonas maltophilia NOT DETECTED NOT DETECTED   Candida albicans NOT DETECTED NOT DETECTED   Candida auris NOT DETECTED NOT DETECTED   Candida glabrata NOT DETECTED NOT DETECTED   Candida krusei NOT DETECTED NOT DETECTED   Candida parapsilosis NOT DETECTED NOT DETECTED   Candida tropicalis NOT DETECTED NOT DETECTED   Cryptococcus neoformans/gattii NOT DETECTED NOT DETECTED   Meth resistant mecA/C and MREJ NOT DETECTED NOT DETECTED    Rebecca Park 03/14/2021  1:16 PM

## 2021-03-14 NOTE — Progress Notes (Signed)
PHARMACIST - PHYSICIAN COMMUNICATION  CONCERNING:  Enoxaparin (Lovenox) for DVT Prophylaxis    RECOMMENDATION: Patient was prescribed enoxaprin 40mg  q24 hours for VTE prophylaxis.   Filed Weights   03/13/21 2339  Weight: 104.3 kg (230 lb)    Body mass index is 37.12 kg/m.  Estimated Creatinine Clearance: 34 mL/min (A) (by C-G formula based on SCr of 2.15 mg/dL (H)).   Based on [place] patient is candidate for enoxaparin 0.5mg /kg TBW SQ every 24 hours based on BMI being >30.  DESCRIPTION: Pharmacy has adjusted enoxaparin dose per [facility] policy.  Patient is now receiving enoxaparin 0.5 mg/kg every 24 hours   Renda Rolls, PharmD, [facility] 03/14/2021 1:32 AM

## 2021-03-14 NOTE — Progress Notes (Signed)
CODE SEPSIS - PHARMACY COMMUNICATION  **Broad Spectrum Antibiotics should be administered within 1 hour of Sepsis diagnosis**  Time Code Sepsis Called/Page Received: 2103  Antibiotics Ordered: Cefepime and Vancomycin   Time of 1st antibiotic administration: [place], PharmD, [facility] 03/14/2021 12:37 AM

## 2021-03-14 NOTE — Progress Notes (Signed)
PHARMACY -  BRIEF ANTIBIOTIC NOTE   Pharmacy has received consult(s) for Cefepime and Vancomycin from an ED provider.  The patient's profile has been reviewed for ht/wt/allergies/indication/available labs.    One time order(s) placed for Cefepime 2 gm and Vanc 2 gm per pt wt of 104.3 kg.  Further antibiotics/pharmacy consults should be ordered by admitting physician if indicated.                       Renda Rolls, PharmD, [facility] 03/14/2021 12:39 AM

## 2021-03-14 NOTE — Progress Notes (Signed)
Patient seen and examined.  Admitted early morning hours by nighttime hospitalist.  History physical assessment plan reviewed.  Case also discussed with admitting physician as well as podiatry provider Dr. Cleda Mccreedy.  60 year old with history of type 2 diabetes on glipizide, hypertension, Charcot foot with about 4 months of medial malleoli infection and open ulceration under conservative management by Dr. Luana Shu presented with infected purulent drainage and surrounding erythema, nausea vomiting.  Severe sepsis present on admission, tachycardic, acute kidney injury, lactic acidosis, x-ray with possible osteomyelitis: Resuscitated.  Continue maintenance IV fluid.  Allow oral liquids. Broad-spectrum antibiotics with cefepime, vancomycin and Flagyl. Intake and output monitoring.  Holding diuretics. Podiatry consulted, MRI pending.  May need further debridement or more surgical procedures. Hemoglobin A1c pending.  Glipizide was resumed.  Also keep on sliding scale insulin.   Same-day admission.  Time spent 30 minutes.  No charge visit.

## 2021-03-14 NOTE — ED Notes (Addendum)
Sent green top for serum creatinine to lab

## 2021-03-14 NOTE — Progress Notes (Signed)
Pharmacy Antibiotic Note  Rebecca Park is a 60 y.o. female admitted on 03/13/2021 with cellulitis.  Pharmacy has been consulted for Cefepime and Vancomycin dosing for 7 days.  Plan: Cefepime 2 gm q12hr x 7 days  Pt given initial dose of Vanc 2 gm.  Likely AKI.  Current Vanc AUC dosing interval > 24hr.  Pharmacy will continue to follow and order additional doses as warranted.  Height: 5\' 6"  (167.6 cm) Weight: 104.3 kg (230 lb) IBW/kg (Calculated) : 59.3  Temp (24hrs), Avg:98.1 F (36.7 C), Min:98.1 F (36.7 C), Max:98.1 F (36.7 C)  Recent Labs  Lab 03/13/21 2349  WBC 18.1*  CREATININE 2.15*  LATICACIDVEN 2.7*   3.1*    Estimated Creatinine Clearance: 34 mL/min (A) (by C-G formula based on SCr of 2.15 mg/dL (H)).    No Known Allergies  Antimicrobials this admission: 12/17 Cefepime >> x 7 days 12/17 Vancomycin >> x 7 days 12/17 Flagyl >> x 7 days  Microbiology results: 12/16 BCx: Pending 12/16 UCx: Pending  12/16 Deep Wound Cx: Pending   Thank you for allowing pharmacy to be a part of this patients care.  Renda Rolls, PharmD, [facility] 03/14/2021 2:47 AM

## 2021-03-14 NOTE — ED Notes (Signed)
Messaged MD regarding patient increased RR and SPO2 dropping to low 90's. Placed pt on 2L/min nasal cannula O2 for comfort and sats. Saturation improved to high 90's.

## 2021-03-14 NOTE — ED Notes (Signed)
Pt reports feeling mildly improved/less nauseous after Phenergan.

## 2021-03-14 NOTE — ED Notes (Signed)
Left foot wound cleansed, dressed with xeroform, ABD pads, ace wrap; patient tolerated well

## 2021-03-14 NOTE — ED Notes (Addendum)
Per MD can have ice chips, water and PO diet now. Also per MD start LR at 125 ml/hr due to continued tachycardia.

## 2021-03-14 NOTE — Consult Note (Signed)
[facility] for Infectious Disease       Reason for Consult:MSSA bacteremia   Referring Physician: CHAMP autoconsult - remote consult, did not see patient  Active Problems:   * No active hospital problems. *    atorvastatin  40 mg Oral Daily   enoxaparin (LOVENOX) injection  0.5 mg/kg Subcutaneous Q24H   insulin aspart  0-20 Units Subcutaneous TID AC & HS   insulin glargine-yfgn  5 Units Subcutaneous BID   multivitamin with minerals   Oral Daily    Recommendations: Cefazolin TTE, consider TEE if negative Surgical management Repeat blood cultures after amputation  Assessment: She has MSSA bacteremia from her foot as a source and will need source control when agreeable.   Dr. Delaine Lame will follow up on Monday  Antibiotics: cefazoline  HPI: Rebecca Park is a 60 y.o. female with DM and last Hgb A1c of 7.9 came in with an infected foot and bacteremia.     Past Medical History:  Diagnosis Date   Diabetes mellitus without complication (HCC)    Hypertension    PONV (postoperative nausea and vomiting)    the day after rt 5th toe amputation surgery    Social History   Tobacco Use   Smoking status: Never   Smokeless tobacco: Never  Vaping Use   Vaping Use: Never used  Substance Use Topics   Alcohol use: Not Currently    Comment: recently quit 01/2018, 3-4 beers QD   Drug use: Never    Family History  Problem Relation Age of Onset   Heart disease Father    Heart attack Father    Heart disease Maternal Grandfather    Breast cancer Neg Hx     No Known Allergies  Physical Exam: Vitals:   03/14/21 1230 03/14/21 1500  BP: 109/61   Pulse: (!) 114 (!) 112  Resp: (!) 29 (!) 30  Temp:    SpO2: 97% 91%     Lab Results  Component Value Date   WBC 17.7 (H) 03/14/2021   HGB 9.5 (L) 03/14/2021   HCT 28.9 (L) 03/14/2021   MCV 84.3 03/14/2021   PLT 154 03/14/2021    Lab Results  Component Value Date   CREATININE 1.85 (H) 03/14/2021   BUN 41  (H) 03/14/2021   NA 128 (L) 03/14/2021   K 4.2 03/14/2021   CL 94 (L) 03/14/2021   CO2 24 03/14/2021    Lab Results  Component Value Date   ALT 23 03/13/2021   AST 25 03/13/2021   ALKPHOS 213 (H) 03/13/2021     Microbiology: Recent Results (from the past 240 hour(s))  Culture, blood (Routine x 2)     Status: None (Preliminary result)   Collection Time: 03/13/21 11:49 PM   Specimen: BLOOD  Result Value Ref Range Status   Specimen Description BLOOD LEFT ASSIST CONTROL  Final   Special Requests   Final    BOTTLES DRAWN AEROBIC AND ANAEROBIC Blood Culture adequate volume   Culture  Setup Time   Final    Organism ID to follow GRAM POSITIVE COCCI IN BOTH AEROBIC AND ANAEROBIC BOTTLES CRITICAL RESULT CALLED TO, READ BACK BY AND VERIFIED WITH: ALEX CHAPPELL AT [place] 03/14/21.PMF Performed at [facility], [address] [place], [place] [address]    Culture [facility] POSITIVE COCCI  Final   Report Status PENDING  Incomplete  Culture, blood (Routine x 2)     Status: None (Preliminary result)   Collection Time: 03/13/21  11:49 PM   Specimen: BLOOD  Result Value Ref Range Status   Specimen Description BLOOD RIGHT FOREARM  Final   Special Requests   Final    BOTTLES DRAWN AEROBIC AND ANAEROBIC Blood Culture adequate volume   Culture  Setup Time   Final    GRAM POSITIVE COCCI IN BOTH AEROBIC AND ANAEROBIC BOTTLES CRITICAL RESULT CALLED TO, READ BACK BY AND VERIFIED WITH: ALEX CHAPPELL AT 0254 03/14/21.PMF Performed at [facility], [address] [place], [place] [address]    Culture [facility] POSITIVE COCCI  Final   Report Status PENDING  Incomplete  Resp Panel by RT-PCR (Flu A&B, Covid) Nasopharyngeal Swab     Status: None   Collection Time: 03/13/21 11:49 PM   Specimen: Nasopharyngeal Swab; Nasopharyngeal(NP) swabs in vial transport medium  Result Value Ref Range Status   SARS Coronavirus 2 by RT PCR NEGATIVE NEGATIVE Final    Comment: (NOTE) SARS-CoV-2 target  nucleic acids are NOT DETECTED.  The SARS-CoV-2 RNA is generally detectable in upper respiratory specimens during the acute phase of infection. The lowest concentration of SARS-CoV-2 viral copies this assay can detect is 138 copies/mL. A negative result does not preclude SARS-Cov-2 infection and should not be used as the sole basis for treatment or other patient management decisions. A negative result may occur with  improper specimen collection/handling, submission of specimen other than nasopharyngeal swab, presence of viral mutation(s) within the areas targeted by this assay, and inadequate number of viral copies(<138 copies/mL). A negative result must be combined with clinical observations, patient history, and epidemiological information. The expected result is Negative.  Fact Sheet for Patients:  EntrepreneurPulse.com.au  Fact Sheet for Healthcare Providers:  IncredibleEmployment.be  This test is no t yet approved or cleared by the [place] FDA and  has been authorized for detection and/or diagnosis of SARS-CoV-2 by FDA under an Emergency Use Authorization (EUA). This EUA will remain  in effect (meaning this test can be used) for the duration of the COVID-19 declaration under Section 564(b)(1) of the Act, 21 U.S.C.section 360bbb-3(b)(1), unless the authorization is terminated  or revoked sooner.       Influenza A by PCR NEGATIVE NEGATIVE Final   Influenza B by PCR NEGATIVE NEGATIVE Final    Comment: (NOTE) The Xpert Xpress SARS-CoV-2/FLU/RSV plus assay is intended as an aid in the diagnosis of influenza from Nasopharyngeal swab specimens and should not be used as a sole basis for treatment. Nasal washings and aspirates are unacceptable for Xpert Xpress SARS-CoV-2/FLU/RSV testing.  Fact Sheet for Patients: EntrepreneurPulse.com.au  Fact Sheet for Healthcare  Providers: IncredibleEmployment.be  This test is not yet approved or cleared by the [place] FDA and has been authorized for detection and/or diagnosis of SARS-CoV-2 by FDA under an Emergency Use Authorization (EUA). This EUA will remain in effect (meaning this test can be used) for the duration of the COVID-19 declaration under Section 564(b)(1) of the Act, 21 U.S.C. section 360bbb-3(b)(1), unless the authorization is terminated or revoked.  Performed at [facility], [address] [place], [place] [address]   Blood Culture ID Panel (Reflexed)     Status: Abnormal   Collection Time: 03/13/21 11:49 PM  Result Value Ref Range Status   Enterococcus faecalis NOT DETECTED NOT DETECTED Final   Enterococcus Faecium NOT DETECTED NOT DETECTED Final   Listeria monocytogenes NOT DETECTED NOT DETECTED Final   Staphylococcus species DETECTED (A) NOT DETECTED Final    Comment: CRITICAL RESULT CALLED TO, READ BACK  BY AND VERIFIED WITH: ALEX CHAPPELL AT 9604 03/14/21.PMF    Staphylococcus aureus (BCID) DETECTED (A) NOT DETECTED Final    Comment: CRITICAL RESULT CALLED TO, READ BACK BY AND VERIFIED WITH: ALEX CHAPPELL AT 1314 03/14/21.PMF    Staphylococcus epidermidis NOT DETECTED NOT DETECTED Final   Staphylococcus lugdunensis NOT DETECTED NOT DETECTED Final   Streptococcus species NOT DETECTED NOT DETECTED Final   Streptococcus agalactiae NOT DETECTED NOT DETECTED Final   Streptococcus pneumoniae NOT DETECTED NOT DETECTED Final   Streptococcus pyogenes NOT DETECTED NOT DETECTED Final   A.calcoaceticus-baumannii NOT DETECTED NOT DETECTED Final   Bacteroides fragilis NOT DETECTED NOT DETECTED Final   Enterobacterales NOT DETECTED NOT DETECTED Final   Enterobacter cloacae complex NOT DETECTED NOT DETECTED Final   Escherichia coli NOT DETECTED NOT DETECTED Final   Klebsiella aerogenes NOT DETECTED NOT DETECTED Final   Klebsiella oxytoca NOT DETECTED NOT  DETECTED Final   Klebsiella pneumoniae NOT DETECTED NOT DETECTED Final   Proteus species NOT DETECTED NOT DETECTED Final   Salmonella species NOT DETECTED NOT DETECTED Final   Serratia marcescens NOT DETECTED NOT DETECTED Final   Haemophilus influenzae NOT DETECTED NOT DETECTED Final   Neisseria meningitidis NOT DETECTED NOT DETECTED Final   Pseudomonas aeruginosa NOT DETECTED NOT DETECTED Final   Stenotrophomonas maltophilia NOT DETECTED NOT DETECTED Final   Candida albicans NOT DETECTED NOT DETECTED Final   Candida auris NOT DETECTED NOT DETECTED Final   Candida glabrata NOT DETECTED NOT DETECTED Final   Candida krusei NOT DETECTED NOT DETECTED Final   Candida parapsilosis NOT DETECTED NOT DETECTED Final   Candida tropicalis NOT DETECTED NOT DETECTED Final   Cryptococcus neoformans/gattii NOT DETECTED NOT DETECTED Final   Meth resistant mecA/C and MREJ NOT DETECTED NOT DETECTED Final    Comment: Performed at [facility], [address] [place], [place] [address]  Aerobic/Anaerobic Culture w Gram Stain (surgical/deep wound)     Status: None (Preliminary result)   Collection Time: 03/13/21 11:50 PM   Specimen: Ankle; Wound  Result Value Ref Range Status   Specimen Description   Final    ANKLE Performed at [facility], [address] [place], [place] [address]    Special Requests   Final    NONE Performed at [facility], [place], [place] [address]    Gram Stain   Final    NO SQUAMOUS EPITHELIAL CELLS SEEN FEW WBC SEEN MODERATE GRAM POSITIVE COCCI Performed at [facility], [place] [address] [place], [place] [address]    Culture PENDING  Incomplete   Report Status PENDING  Incomplete    Thayer Headings, [place] for Infectious Disease [facility] [email or web address] 03/14/2021, 3:19 PM

## 2021-03-14 NOTE — ED Notes (Signed)
Messaged attending re: concerns of pt tachycardia despite fluids overnight as well as slightly elevated RR above 20's. MD will come to see pt.

## 2021-03-15 ENCOUNTER — Inpatient Hospital Stay: Payer: BC Managed Care – PPO

## 2021-03-15 ENCOUNTER — Inpatient Hospital Stay: Payer: BC Managed Care – PPO | Admitting: Registered Nurse

## 2021-03-15 ENCOUNTER — Encounter
Admission: EM | Disposition: A | Discharge status: Skilled Nursing Facility | Payer: Self-pay | Source: Home / Self Care | Attending: Internal Medicine

## 2021-03-15 DIAGNOSIS — M86171 Other acute osteomyelitis, right ankle and foot: Secondary | ICD-10-CM | POA: Diagnosis not present

## 2021-03-15 DIAGNOSIS — M00872 Arthritis due to other bacteria, left ankle and foot: Secondary | ICD-10-CM | POA: Diagnosis not present

## 2021-03-15 DIAGNOSIS — I10 Essential (primary) hypertension: Secondary | ICD-10-CM | POA: Diagnosis not present

## 2021-03-15 HISTORY — PX: AMPUTATION: SHX166

## 2021-03-15 LAB — HEPATIC FUNCTION PANEL
ALT: 18 U/L (ref 0–44)
AST: 21 U/L (ref 15–41)
Albumin: 2.2 g/dL — ABNORMAL LOW (ref 3.5–5.0)
Alkaline Phosphatase: 148 U/L — ABNORMAL HIGH (ref 38–126)
Bilirubin, Direct: 0.7 mg/dL — ABNORMAL HIGH (ref 0.0–0.2)
Indirect Bilirubin: 0.9 mg/dL (ref 0.3–0.9)
Total Bilirubin: 1.6 mg/dL — ABNORMAL HIGH (ref 0.3–1.2)
Total Protein: 5.9 g/dL — ABNORMAL LOW (ref 6.5–8.1)

## 2021-03-15 LAB — CBC WITH DIFFERENTIAL/PLATELET
Abs Immature Granulocytes: 0.13 10*3/uL — ABNORMAL HIGH (ref 0.00–0.07)
Basophils Absolute: 0 10*3/uL (ref 0.0–0.1)
Basophils Relative: 0 %
Eosinophils Absolute: 0 10*3/uL (ref 0.0–0.5)
Eosinophils Relative: 0 %
HCT: 28.9 % — ABNORMAL LOW (ref 36.0–46.0)
Hemoglobin: 9.5 g/dL — ABNORMAL LOW (ref 12.0–15.0)
Immature Granulocytes: 1 %
Lymphocytes Relative: 4 %
Lymphs Abs: 0.5 10*3/uL — ABNORMAL LOW (ref 0.7–4.0)
MCH: 27.5 pg (ref 26.0–34.0)
MCHC: 32.9 g/dL (ref 30.0–36.0)
MCV: 83.8 fL (ref 80.0–100.0)
Monocytes Absolute: 0.6 10*3/uL (ref 0.1–1.0)
Monocytes Relative: 5 %
Neutro Abs: 10.9 10*3/uL — ABNORMAL HIGH (ref 1.7–7.7)
Neutrophils Relative %: 90 %
Platelets: 175 10*3/uL (ref 150–400)
RBC: 3.45 MIL/uL — ABNORMAL LOW (ref 3.87–5.11)
RDW: 14.5 % (ref 11.5–15.5)
WBC: 12.2 10*3/uL — ABNORMAL HIGH (ref 4.0–10.5)
nRBC: 0 % (ref 0.0–0.2)

## 2021-03-15 LAB — MAGNESIUM: Magnesium: 2 mg/dL (ref 1.7–2.4)

## 2021-03-15 LAB — BASIC METABOLIC PANEL
Anion gap: 7 (ref 5–15)
BUN: 30 mg/dL — ABNORMAL HIGH (ref 6–20)
CO2: 25 mmol/L (ref 22–32)
Calcium: 8.8 mg/dL — ABNORMAL LOW (ref 8.9–10.3)
Chloride: 98 mmol/L (ref 98–111)
Creatinine, Ser: 1.23 mg/dL — ABNORMAL HIGH (ref 0.44–1.00)
GFR, Estimated: 50 mL/min — ABNORMAL LOW (ref >60)
Glucose, Bld: 275 mg/dL — ABNORMAL HIGH (ref 70–99)
Potassium: 3.7 mmol/L (ref 3.5–5.1)
Sodium: 130 mmol/L — ABNORMAL LOW (ref 135–145)

## 2021-03-15 LAB — LACTIC ACID, PLASMA
Lactic Acid, Venous: 1.6 mmol/L (ref 0.5–1.9)
Lactic Acid, Venous: 1.6 mmol/L (ref 0.5–1.9)

## 2021-03-15 LAB — GLUCOSE, CAPILLARY
Glucose-Capillary: 271 mg/dL — ABNORMAL HIGH (ref 70–99)
Glucose-Capillary: 261 mg/dL — ABNORMAL HIGH (ref 70–99)
Glucose-Capillary: 251 mg/dL — ABNORMAL HIGH (ref 70–99)
Glucose-Capillary: 276 mg/dL — ABNORMAL HIGH (ref 70–99)
Glucose-Capillary: 241 mg/dL — ABNORMAL HIGH (ref 70–99)

## 2021-03-15 LAB — URINE CULTURE: Culture: NO GROWTH

## 2021-03-15 LAB — PHOSPHORUS: Phosphorus: 2.6 mg/dL (ref 2.5–4.6)

## 2021-03-15 SURGERY — AMPUTATION BELOW KNEE
Anesthesia: General | Site: Knee | Laterality: Left

## 2021-03-15 MED ORDER — PROCHLORPERAZINE EDISYLATE 10 MG/2ML IJ SOLN
5.0000 mg | Freq: Once | INTRAMUSCULAR | Status: AC
  Administered 2021-03-15: 03:00:00 5 mg via INTRAVENOUS
  Filled 2021-03-15: qty 1

## 2021-03-15 MED ORDER — LIDOCAINE HCL (CARDIAC) PF 100 MG/5ML IV SOSY
PREFILLED_SYRINGE | INTRAVENOUS | Status: DC | PRN
  Administered 2021-03-15: 60 mg via INTRAVENOUS

## 2021-03-15 MED ORDER — FENTANYL CITRATE (PF) 100 MCG/2ML IJ SOLN
INTRAMUSCULAR | Status: AC
  Filled 2021-03-15: qty 2

## 2021-03-15 MED ORDER — SUCCINYLCHOLINE CHLORIDE 200 MG/10ML IV SOSY
PREFILLED_SYRINGE | INTRAVENOUS | Status: DC | PRN
Start: 2021-03-15 — End: 2021-03-15
  Administered 2021-03-15: 120 mg via INTRAVENOUS

## 2021-03-15 MED ORDER — FENTANYL CITRATE (PF) 100 MCG/2ML IJ SOLN
25.0000 ug | INTRAMUSCULAR | Status: DC | PRN
  Administered 2021-03-15: 13:00:00 25 ug via INTRAVENOUS

## 2021-03-15 MED ORDER — ACETAMINOPHEN 10 MG/ML IV SOLN
INTRAVENOUS | Status: AC
  Filled 2021-03-15: qty 100

## 2021-03-15 MED ORDER — CEFAZOLIN SODIUM-DEXTROSE 2-3 GM-%(50ML) IV SOLR
INTRAVENOUS | Status: DC | PRN
  Administered 2021-03-15: 2 g via INTRAVENOUS

## 2021-03-15 MED ORDER — MIDAZOLAM HCL 2 MG/2ML IJ SOLN
INTRAMUSCULAR | Status: AC
  Filled 2021-03-15: qty 2

## 2021-03-15 MED ORDER — ONDANSETRON HCL 4 MG/2ML IJ SOLN
INTRAMUSCULAR | Status: DC | PRN
  Administered 2021-03-15: 4 mg via INTRAVENOUS

## 2021-03-15 MED ORDER — FENTANYL CITRATE (PF) 100 MCG/2ML IJ SOLN
INTRAMUSCULAR | Status: DC | PRN
  Administered 2021-03-15: 25 ug via INTRAVENOUS
  Administered 2021-03-15: 50 ug via INTRAVENOUS
  Administered 2021-03-15: 25 ug via INTRAVENOUS
  Administered 2021-03-15: 100 ug via INTRAVENOUS

## 2021-03-15 MED ORDER — ONDANSETRON HCL 4 MG/2ML IJ SOLN: 4.0000 mg | Freq: Once | INTRAMUSCULAR | Status: DC | PRN

## 2021-03-15 MED ORDER — MIDAZOLAM HCL 2 MG/2ML IJ SOLN
INTRAMUSCULAR | Status: DC | PRN
  Administered 2021-03-15: 2 mg via INTRAVENOUS

## 2021-03-15 MED ORDER — OXYCODONE-ACETAMINOPHEN 5-325 MG PO TABS
1.0000 | ORAL_TABLET | ORAL | Status: DC | PRN
Start: 2021-03-15 — End: 2021-03-22
  Administered 2021-03-15 – 2021-03-17 (×3): 2 via ORAL
  Filled 2021-03-15 (×3): qty 2

## 2021-03-15 MED ORDER — 0.9 % SODIUM CHLORIDE (POUR BTL) OPTIME
TOPICAL | Status: DC | PRN
  Administered 2021-03-15 (×2): 1000 mL

## 2021-03-15 MED ORDER — VASOPRESSIN 20 UNIT/ML IV SOLN
INTRAVENOUS | Status: DC | PRN
  Administered 2021-03-15: 1 [IU] via INTRAVENOUS

## 2021-03-15 MED ORDER — PROPOFOL 10 MG/ML IV BOLUS
INTRAVENOUS | Status: DC | PRN
  Administered 2021-03-15: 100 mg via INTRAVENOUS

## 2021-03-15 MED ORDER — PROPOFOL 10 MG/ML IV BOLUS
INTRAVENOUS | Status: AC
  Filled 2021-03-15: qty 20

## 2021-03-15 MED ORDER — MORPHINE SULFATE (PF) 4 MG/ML IV SOLN: 4.0000 mg | INTRAVENOUS | Status: DC | PRN

## 2021-03-15 MED ORDER — VASOPRESSIN 20 UNIT/ML IV SOLN
INTRAVENOUS | Status: AC
  Filled 2021-03-15: qty 1

## 2021-03-15 MED ORDER — ACETAMINOPHEN 10 MG/ML IV SOLN
INTRAVENOUS | Status: DC | PRN
  Administered 2021-03-15: 1000 mg via INTRAVENOUS

## 2021-03-15 MED ORDER — PHENYLEPHRINE HCL (PRESSORS) 10 MG/ML IV SOLN
INTRAVENOUS | Status: DC | PRN
  Administered 2021-03-15: 160 ug via INTRAVENOUS
  Administered 2021-03-15 (×4): 80 ug via INTRAVENOUS
  Administered 2021-03-15 (×3): 160 ug via INTRAVENOUS

## 2021-03-15 MED ORDER — LIDOCAINE HCL (PF) 2 % IJ SOLN
INTRAMUSCULAR | Status: AC
  Filled 2021-03-15: qty 5

## 2021-03-15 MED ORDER — METOCLOPRAMIDE HCL 5 MG/ML IJ SOLN
10.0000 mg | Freq: Three times a day (TID) | INTRAMUSCULAR | Status: DC
  Administered 2021-03-15 – 2021-03-23 (×25): 10 mg via INTRAVENOUS
  Filled 2021-03-15 (×26): qty 2

## 2021-03-15 SURGICAL SUPPLY — 38 items
BLADE SAGITTAL WIDE XTHICK NO (BLADE) IMPLANT
BLADE SAW SAG 25.4X90 (BLADE) ×3 IMPLANT
BNDG COHESIVE 4X5 TAN ST LF (GAUZE/BANDAGES/DRESSINGS) ×3 IMPLANT
BNDG ELASTIC 6X5.8 VLCR NS LF (GAUZE/BANDAGES/DRESSINGS) ×3 IMPLANT
BNDG ELASTIC 6X5.8 VLCR STR LF (GAUZE/BANDAGES/DRESSINGS) ×2 IMPLANT
BNDG GAUZE ELAST 4 BULKY (GAUZE/BANDAGES/DRESSINGS) ×6 IMPLANT
BNDG STRETCH 4X75 STRL LF (GAUZE/BANDAGES/DRESSINGS) ×2 IMPLANT
BRUSH SCRUB EZ  4% CHG (MISCELLANEOUS) ×2
BRUSH SCRUB EZ 4% CHG (MISCELLANEOUS) ×1 IMPLANT
CHLORAPREP W/TINT 26 (MISCELLANEOUS) ×3 IMPLANT
DRAPE INCISE IOBAN 66X45 STRL (DRAPES) IMPLANT
ELECT CAUTERY BLADE 6.4 (BLADE) ×3 IMPLANT
ELECT REM PT RETURN 9FT ADLT (ELECTROSURGICAL) ×3
ELECTRODE REM PT RTRN 9FT ADLT (ELECTROSURGICAL) ×1 IMPLANT
GAUZE 4X4 16PLY ~~LOC~~+RFID DBL (SPONGE) ×3 IMPLANT
GAUZE XEROFORM 1X8 LF (GAUZE/BANDAGES/DRESSINGS) ×4 IMPLANT
GLOVE SURG ENC MOIS LTX SZ7 (GLOVE) ×3 IMPLANT
GOWN STRL REUS W/ TWL LRG LVL3 (GOWN DISPOSABLE) ×1 IMPLANT
GOWN STRL REUS W/TWL LRG LVL3 (GOWN DISPOSABLE) ×4
HANDLE YANKAUER SUCT BULB TIP (MISCELLANEOUS) ×3 IMPLANT
KIT TURNOVER KIT A (KITS) ×3 IMPLANT
LABEL OR SOLS (LABEL) ×3 IMPLANT
MANIFOLD NEPTUNE II (INSTRUMENTS) ×3 IMPLANT
NS IRRIG 1000ML POUR BTL (IV SOLUTION) ×5 IMPLANT
PACK EXTREMITY ARMC (MISCELLANEOUS) ×3 IMPLANT
PAD ABD DERMACEA PRESS 5X9 (GAUZE/BANDAGES/DRESSINGS) ×4 IMPLANT
PAD PREP 24X41 OB/GYN DISP (PERSONAL CARE ITEMS) ×3 IMPLANT
SPONGE T-LAP 18X18 ~~LOC~~+RFID (SPONGE) ×3 IMPLANT
STAPLER SKIN PROX 35W (STAPLE) ×3 IMPLANT
STOCKINETTE M/LG 89821 (MISCELLANEOUS) ×3 IMPLANT
SUT SILK 2 0 (SUTURE) ×4
SUT SILK 2 0 SH (SUTURE) ×6 IMPLANT
SUT SILK 2-0 18XBRD TIE 12 (SUTURE) ×1 IMPLANT
SUT SILK 3 0 (SUTURE) ×2
SUT SILK 3-0 18XBRD TIE 12 (SUTURE) ×1 IMPLANT
SUT VIC AB 0 CT1 36 (SUTURE) ×6 IMPLANT
SUT VIC AB 2-0 CT1 (SUTURE) ×6 IMPLANT
WATER STERILE IRR 500ML POUR (IV SOLUTION) ×3 IMPLANT

## 2021-03-15 NOTE — Progress Notes (Signed)
°   03/15/21 0743  Assess: MEWS Score  Temp 98.9 F (37.2 C)  BP 120/70  Pulse Rate (!) 110  Resp 18  Level of Consciousness Alert  SpO2 98 %  O2 Device Nasal Cannula  O2 Flow Rate (L/min) 2 L/min  Assess: MEWS Score  MEWS Temp 0  MEWS Systolic 0  MEWS Pulse 1  MEWS RR 0  MEWS LOC 0  MEWS Score 1  MEWS Score Color Green  Document  Patient Outcome Stabilized after interventions  Assess: SIRS CRITERIA  SIRS Temperature  0  SIRS Pulse 1  SIRS Respirations  0  SIRS WBC 1  SIRS Score Sum  2

## 2021-03-15 NOTE — Plan of Care (Signed)
°  Problem: Education: Goal: Knowledge of General Education information will improve Description: Including pain rating scale, medication(s)/side effects and non-pharmacologic comfort measures Outcome: Progressing   Problem: Clinical Measurements: Goal: Ability to maintain clinical measurements within normal limits will improve Outcome: Progressing Goal: Will remain free from infection Outcome: Progressing Goal: Diagnostic test results will improve Outcome: Progressing Goal: Respiratory complications will improve Outcome: Progressing Goal: Cardiovascular complication will be avoided Outcome: Progressing   Problem: Pain Managment: Goal: General experience of comfort will improve Outcome: Progressing   Problem: Safety: Goal: Ability to remain free from injury will improve Outcome: Progressing   Problem: Skin Integrity: Goal: Risk for impaired skin integrity will decrease Outcome: Progressing   Problem: Nutritional: Goal: Maintenance of adequate nutrition will improve Outcome: Progressing Goal: Progress toward achieving an optimal weight will improve Outcome: Progressing   Problem: Skin Integrity: Goal: Risk for impaired skin integrity will decrease Outcome: Progressing   Problem: Activity: Goal: Ability to perform//tolerate increased activity and mobilize with assistive devices will improve Outcome: Progressing   Problem: Education: Goal: Knowledge of the prescribed therapeutic regimen will improve Outcome: Progressing Goal: Ability to verbalize activity precautions or restrictions will improve Outcome: Progressing Goal: Understanding of discharge needs will improve Outcome: Progressing

## 2021-03-15 NOTE — CV Procedure (Signed)
OPERATIVE NOTE   PROCEDURE: Left below-the-knee amputation  PRE-OPERATIVE DIAGNOSIS: Left foot gangrene  POST-OPERATIVE DIAGNOSIS: same as above  SURGEON: Zara Chess, MD  ASSISTANT(S): none  ANESTHESIA: general  ESTIMATED BLOOD LOSS: 50 IV fluid: 1000 Cc Drains: None  FINDING(S): 1.  Viable muscle groups 2.  Extensive edema and without gross purulence 3.  Closure without significant tension  SPECIMEN(S):  Left below-the-knee amputation  INDICATIONS:   Rebecca Park is a 60 y.o. female who presents with left leg gangrene.  The patient is scheduled for a left below-the-knee amputation.  I discussed in depth with the patient the risks, benefits, and alternatives to this procedure.  The patient is aware that the risk of this operation included but are not limited to:  bleeding, infection, myocardial infarction, stroke, death, failure to heal amputation wound, and possible need for more proximal amputation.  The patient is aware of the risks and agrees proceed forward with the procedure.  DESCRIPTION:  After full informed written consent was obtained from the patient, the patient was brought back to the operating room, and placed supine upon the operating table.  Prior to induction, the patient received IV antibiotics.  The patient was then prepped and draped in the standard fashion for a below-the-knee amputation.  After obtaining adequate anesthesia, the patient was prepped and draped in the standard fashion for a left below-the-knee amputation.  The foot was prepped but then wrapped around the field with stockinette, [place].    I marked out the anterior incision four finger breadths below the tibial tuberosity and then the marked out a posterior flap that was one third of the circumference of the calf in length.   I made the incisions for these flaps, and then dissected through the subcutaneous tissue, fascia, and muscle anteriorly.  I elevated  the periosteal tissue superiorly  so that the tibia was about 3-4 cm shorter than the anterior skin flap.  I then transected the tibia with a power saw and then took a wedge off the tibia anteriorly with the power saw.  Then I smoothed out the rough edges.  In a similar fashion, I cut back the fibula about two centimeters higher than the level of the tibia with a bone cutter.  I put a bone hook into the distal tibia and then used electrocautery to develop a tissue plane through the muscle along the fibula.  In such fashion, the posterior flap was developed.  At this point, the specimen was passed off the field as the below-the-knee amputation.  At this point, I clamped all visibly bleeding arteries and veins using a combination of suture ligation with Silk suture and electrocautery.  Bleeding continued to be controlled with electrocautery and suture ligature.  The stump was washed off with sterile normal saline and no further active bleeding was noted.  I reapproximated the anterior and posterior fascia  with interrupted stitches of 0 Vicryl.  This was completed along the entire length of anterior and posterior fascia until there were no more loose space in the fascial line. I then placed a layer of 2-0 Vicryl sutures in the subcutaneous tissue. The skin was then  reapproximated with staples.  The stump was washed off and dried.  The incision was dressed with Xeroform and  then fluffs were applied.  Kerlix was wrapped around the leg and then gently an ACE wrap was applied.    COMPLICATIONS: none  CONDITION: stable   Angelli Baruch  03/15/2021, 12:07 PM  This note was created with Dragon Medical transcription system. Any errors in dictation are purely unintentional.

## 2021-03-15 NOTE — Progress Notes (Signed)
Pt admitted onto unit with yellow mews. A/O x4. Denies pain. Intermittent nausea present. No acute distress. MD notified. No new orders. Call bell within reach and care plan reviewed with pt.

## 2021-03-15 NOTE — Transfer of Care (Signed)
Immediate Anesthesia Transfer of Care Note  Patient: Rebecca Park  Procedure(s) Performed: AMPUTATION BELOW KNEE (Left: Knee)  Patient Location: PACU  Anesthesia Type:General  Level of Consciousness: awake, alert  and oriented  Airway & Oxygen Therapy: Patient Spontanous Breathing  Post-op Assessment: Report given to RN and Post -op Vital signs reviewed and stable  Post vital signs: Reviewed and stable  Last Vitals:  Vitals Value Taken Time  BP 94/69 03/15/21 1157  Temp 36.2 C 03/15/21 1157  Pulse 94 03/15/21 1157  Resp 14 03/15/21 1157  SpO2 94 % 03/15/21 1157  Vitals shown include unvalidated device data.  Last Pain:  Vitals:   03/15/21 1157  TempSrc:   PainSc: 0-No pain         Complications: No notable events documented.

## 2021-03-15 NOTE — Progress Notes (Signed)
Informed consent:  Procedure planned: Left below-knee amputation, possible guillotine amputation  Consenting party: Patient  I discussed risks at length with patient including pain, bleeding, infection, failure to heal, need for further revision, other untoward event.  Her current circumstances include septicemia, with tachycardia, and she understands that if she does not do something she may well have an adverse outcome overall.  All questions were answered.  We will plan on proceeding today with left below-knee amputation.  Zara Chess, MD [phone]

## 2021-03-15 NOTE — Anesthesia Preprocedure Evaluation (Signed)
Anesthesia Evaluation  Patient identified by MRN, date of birth, ID band Patient awake    Reviewed: Allergy & Precautions, H&P , NPO status , Patient's Chart, lab work & pertinent test results, reviewed documented beta blocker date and time   History of Anesthesia Complications (+) PONV and history of anesthetic complications  Airway Mallampati: II  TM Distance: >3 FB Neck ROM: full    Dental  (+) Teeth Intact   Pulmonary neg pulmonary ROS,    Pulmonary exam normal        Cardiovascular Exercise Tolerance: Poor hypertension, On Medications + Peripheral Vascular Disease  Normal cardiovascular exam Rate:Normal     Neuro/Psych negative neurological ROS  negative psych ROS   GI/Hepatic negative GI ROS, Neg liver ROS,   Endo/Other  negative endocrine ROSdiabetes  Renal/GU negative Renal ROS  negative genitourinary   Musculoskeletal   Abdominal   Peds  Hematology negative hematology ROS (+)   Anesthesia Other Findings   Reproductive/Obstetrics negative OB ROS                             Anesthesia Physical Anesthesia Plan  ASA: 3 and emergent  Anesthesia Plan: General LMA   Post-op Pain Management:    Induction:   PONV Risk Score and Plan:   Airway Management Planned:   Additional Equipment:   Intra-op Plan:   Post-operative Plan:   Informed Consent: I have reviewed the patients History and Physical, chart, labs and discussed the procedure including the risks, benefits and alternatives for the proposed anesthesia with the patient or authorized representative who has indicated his/her understanding and acceptance.       Plan Discussed with: CRNA  Anesthesia Plan Comments:         Anesthesia Quick Evaluation

## 2021-03-15 NOTE — Progress Notes (Signed)
PROGRESS NOTE    Rebecca Park  YSA:[id number] DOB: [birth year] DOA: 03/13/2021 PCP: Marinda Elk, MD    Brief Narrative:  60 year old with history of type 2 diabetes on glipizide, hypertension, Charcot foot with about 4 months of medial malleoli infection and open ulceration under conservative management by Dr. Luana Shu presented with infected purulent drainage and surrounding erythema, nausea vomiting.   Severe sepsis present on admission, tachycardic, acute kidney injury, lactic acidosis, x-ray with possible osteomyelitis  Seen in consultation by podiatry and vascular surgery.  Podiatry felt that limb was not salvageable.  Signed off and involve vascular surgery.  After vascular surgery evaluation patient initially refused amputation however now agreeable.  Plan for amputation on 12/18.  Discussed with vascular surgery.   Assessment & Plan:   Active Problems:   Osteomyelitis (HCC)  Charcot foot Left medial malleolus cellulitis with osteomyelitis Severe sepsis secondary to above Sepsis physiology is improving Podiatry feels foot is nonsalvageable Vascular surgery consulted, patient agreeable to amputation Plan: IV fluids Pain control Broad-spectrum antibiotics Monitor vitals and fever curve N.p.o. for amputation with vascular today 12/18  Hyperlipidemia PTA statin  Insulin-dependent diabetes mellitus Semglee 5 units twice daily Resistant sliding scale Carb modified diet once diet advanced  Essential hypertension PTA Norvasc   DVT prophylaxis: SQ Lovenox, on hold for Code Status: Full code Family Communication: Spouse at bedside Disposition Plan: Status is: Inpatient  Remains inpatient appropriate because: Left lower extremity osteomyelitis plan for amputation today       Level of care: Telemetry Medical  Consultants:  Vascular surgery  Procedures:  L LE amputation 12/18  Antimicrobials: Cefepime Vancomycin   Subjective: Seen and examined.   Dors is nausea.  Resting comfortably in bed.  No visible distress.  Objective: Vitals:   03/15/21 0446 03/15/21 0646 03/15/21 0743 03/15/21 0826  BP: 110/69 116/72 120/70 119/76  Pulse: (!) 106 (!) 112 (!) 110 (!) 108  Resp: (!) 26 (!) 28 18 15   Temp: 99.5 F (37.5 C) 98.9 F (37.2 C) 98.9 F (37.2 C) (!) 97.4 F (36.3 C)  TempSrc: Oral Oral    SpO2: 98% 98% 98% 97%  Weight:      Height:        Intake/Output Summary (Last 24 hours) at 03/15/2021 1105 Last data filed at 03/15/2021 0700 Gross per 24 hour  Intake 3300 ml  Output 1250 ml  Net 2050 ml   Filed Weights   03/13/21 2339  Weight: 104.3 kg    Examination:  General exam: No acute distress.  Nervous about surgery Respiratory system: Lungs clear.  Normal work of breathing.  Room air Cardiovascular system: S1 & S2 heard, RRR. No JVD, murmurs, rubs, gallops or clicks. No pedal edema. Gastrointestinal system: Abdomen is nondistended, soft and nontender. No organomegaly or masses felt. Normal bowel sounds heard. Central nervous system: Alert and oriented. No focal neurological deficits. Extremities: Symmetric 5 x 5 power. Skin: Left medial ankle wound infection with surrounding erythema and purulent drainage Psychiatry: Judgement and insight appear normal. Mood & affect appropriate.     Data Reviewed: I have personally reviewed following labs and imaging studies  CBC: Recent Labs  Lab 03/13/21 2349 03/14/21 0443 03/15/21 0444  WBC 18.1* 17.7* 12.2*  NEUTROABS 16.8*  --  10.9*  HGB 10.5* 9.5* 9.5*  HCT 32.1* 28.9* 28.9*  MCV 84.0 84.3 83.8  PLT 177 154 323   Basic Metabolic Panel: Recent Labs  Lab 03/13/21 2349 03/14/21 0443 03/15/21 0444  NA 128*  128* 130*  K 3.8 4.2 3.7  CL 92* 94* 98  CO2 24 24 25   GLUCOSE 390* 392* 275*  BUN 44* 41* 30*  CREATININE 2.15* 1.85* 1.23*  CALCIUM 8.4* 8.0* 8.8*  MG 2.0  --  2.0  PHOS  --   --  2.6   GFR: Estimated Creatinine Clearance: 59.4 mL/min (A) (by C-G  formula based on SCr of 1.23 mg/dL (H)). Liver Function Tests: Recent Labs  Lab 03/13/21 2349 03/15/21 0248  AST 25 21  ALT 23 18  ALKPHOS 213* 148*  BILITOT 1.9* 1.6*  PROT 6.8 5.9*  ALBUMIN 2.7* 2.2*   No results for input(s): LIPASE, AMYLASE in the last 168 hours. No results for input(s): AMMONIA in the last 168 hours. Coagulation Profile: Recent Labs  Lab 03/13/21 2349 03/14/21 0443  INR 1.1 1.2   Cardiac Enzymes: No results for input(s): CKTOTAL, CKMB, CKMBINDEX, TROPONINI in the last 168 hours. BNP (last 3 results) No results for input(s): PROBNP in the last 8760 hours. HbA1C: No results for input(s): HGBA1C in the last 72 hours. CBG: Recent Labs  Lab 03/14/21 0744 03/14/21 1235 03/14/21 1854 03/14/21 2150 03/15/21 0830  GLUCAP 377* 325* 266* 278* 271*   Lipid Profile: No results for input(s): CHOL, HDL, LDLCALC, TRIG, CHOLHDL, LDLDIRECT in the last 72 hours. Thyroid Function Tests: No results for input(s): TSH, T4TOTAL, FREET4, T3FREE, THYROIDAB in the last 72 hours. Anemia Panel: No results for input(s): VITAMINB12, FOLATE, FERRITIN, TIBC, IRON, RETICCTPCT in the last 72 hours. Sepsis Labs: Recent Labs  Lab 03/14/21 0443 03/14/21 0748 03/15/21 0204 03/15/21 0444  PROCALCITON 4.26  --   --   --   LATICACIDVEN 1.7 1.6 1.6 1.6    Recent Results (from the past 240 hour(s))  Culture, blood (Routine x 2)     Status: Abnormal (Preliminary result)   Collection Time: 03/13/21 11:49 PM   Specimen: BLOOD  Result Value Ref [place] Status   Specimen Description   Final    BLOOD LEFT ASSIST CONTROL Performed at [facility], [address] [place], [place] [address]    Special Requests   Final    BOTTLES DRAWN AEROBIC AND ANAEROBIC Blood Culture adequate volume Performed at [facility], [address] [place], [place] [address]    Culture  Setup Time   Final    GRAM POSITIVE COCCI IN BOTH AEROBIC AND ANAEROBIC BOTTLES CRITICAL  RESULT CALLED TO, READ BACK BY AND VERIFIED WITH: ALEX CHAPPELL AT 5176 03/14/21.PMF    Culture (A)  Final    STAPHYLOCOCCUS AUREUS SUSCEPTIBILITIES TO FOLLOW Performed at [facility], [place] [address], [place], [place] [address]    Report Status PENDING  Incomplete  Culture, blood (Routine x 2)     Status: None (Preliminary result)   Collection Time: 03/13/21 11:49 PM   Specimen: BLOOD  Result Value Ref [place] Status   Specimen Description BLOOD RIGHT FOREARM  Final   Special Requests   Final    BOTTLES DRAWN AEROBIC AND ANAEROBIC Blood Culture adequate volume   Culture  Setup Time   Final    GRAM POSITIVE COCCI IN BOTH AEROBIC AND ANAEROBIC BOTTLES CRITICAL RESULT CALLED TO, READ BACK BY AND VERIFIED WITH: ALEX CHAPPELL AT 7106 03/14/21.PMF Performed at [facility], [address] [place], [place] [address]    Culture [facility] POSITIVE COCCI  Final   Report Status PENDING  Incomplete  Resp Panel by RT-PCR (Flu A&B, Covid) Nasopharyngeal Swab  Status: None   Collection Time: 03/13/21 11:49 PM   Specimen: Nasopharyngeal Swab; Nasopharyngeal(NP) swabs in vial transport medium  Result Value Ref [place] Status   SARS Coronavirus 2 by RT PCR NEGATIVE NEGATIVE Final    Comment: (NOTE) SARS-CoV-2 target nucleic acids are NOT DETECTED.  The SARS-CoV-2 RNA is generally detectable in upper respiratory specimens during the acute phase of infection. The lowest concentration of SARS-CoV-2 viral copies this assay can detect is 138 copies/mL. A negative result does not preclude SARS-Cov-2 infection and should not be used as the sole basis for treatment or other patient management decisions. A negative result may occur with  improper specimen collection/handling, submission of specimen other than nasopharyngeal swab, presence of viral mutation(s) within the areas targeted by this assay, and inadequate number of viral copies(<138 copies/mL). A negative result must be combined  with clinical observations, patient history, and epidemiological information. The expected result is Negative.  Fact Sheet for Patients:  EntrepreneurPulse.com.au  Fact Sheet for Healthcare Providers:  IncredibleEmployment.be  This test is no t yet approved or cleared by the [place] FDA and  has been authorized for detection and/or diagnosis of SARS-CoV-2 by FDA under an Emergency Use Authorization (EUA). This EUA will remain  in effect (meaning this test can be used) for the duration of the COVID-19 declaration under Section 564(b)(1) of the Act, 21 U.S.C.section 360bbb-3(b)(1), unless the authorization is terminated  or revoked sooner.       Influenza A by PCR NEGATIVE NEGATIVE Final   Influenza B by PCR NEGATIVE NEGATIVE Final    Comment: (NOTE) The Xpert Xpress SARS-CoV-2/FLU/RSV plus assay is intended as an aid in the diagnosis of influenza from Nasopharyngeal swab specimens and should not be used as a sole basis for treatment. Nasal washings and aspirates are unacceptable for Xpert Xpress SARS-CoV-2/FLU/RSV testing.  Fact Sheet for Patients: EntrepreneurPulse.com.au  Fact Sheet for Healthcare Providers: IncredibleEmployment.be  This test is not yet approved or cleared by the [place] FDA and has been authorized for detection and/or diagnosis of SARS-CoV-2 by FDA under an Emergency Use Authorization (EUA). This EUA will remain in effect (meaning this test can be used) for the duration of the COVID-19 declaration under Section 564(b)(1) of the Act, 21 U.S.C. section 360bbb-3(b)(1), unless the authorization is terminated or revoked.  Performed at [facility], [address] [place], [place] [address]   Blood Culture ID Panel (Reflexed)     Status: Abnormal   Collection Time: 03/13/21 11:49 PM  Result Value Ref [place] Status   Enterococcus faecalis NOT DETECTED NOT DETECTED  Final   Enterococcus Faecium NOT DETECTED NOT DETECTED Final   Listeria monocytogenes NOT DETECTED NOT DETECTED Final   Staphylococcus species DETECTED (A) NOT DETECTED Final    Comment: CRITICAL RESULT CALLED TO, READ BACK BY AND VERIFIED WITH: ALEX CHAPPELL AT 1314 03/14/21.PMF    Staphylococcus aureus (BCID) DETECTED (A) NOT DETECTED Final    Comment: CRITICAL RESULT CALLED TO, READ BACK BY AND VERIFIED WITH: ALEX CHAPPELL AT 1314 03/14/21.PMF    Staphylococcus epidermidis NOT DETECTED NOT DETECTED Final   Staphylococcus lugdunensis NOT DETECTED NOT DETECTED Final   Streptococcus species NOT DETECTED NOT DETECTED Final   Streptococcus agalactiae NOT DETECTED NOT DETECTED Final   Streptococcus pneumoniae NOT DETECTED NOT DETECTED Final   Streptococcus pyogenes NOT DETECTED NOT DETECTED Final   A.calcoaceticus-baumannii NOT DETECTED NOT DETECTED Final   Bacteroides fragilis NOT DETECTED NOT DETECTED Final   Enterobacterales NOT DETECTED NOT DETECTED  Final   Enterobacter cloacae complex NOT DETECTED NOT DETECTED Final   Escherichia coli NOT DETECTED NOT DETECTED Final   Klebsiella aerogenes NOT DETECTED NOT DETECTED Final   Klebsiella oxytoca NOT DETECTED NOT DETECTED Final   Klebsiella pneumoniae NOT DETECTED NOT DETECTED Final   Proteus species NOT DETECTED NOT DETECTED Final   Salmonella species NOT DETECTED NOT DETECTED Final   Serratia marcescens NOT DETECTED NOT DETECTED Final   Haemophilus influenzae NOT DETECTED NOT DETECTED Final   Neisseria meningitidis NOT DETECTED NOT DETECTED Final   Pseudomonas aeruginosa NOT DETECTED NOT DETECTED Final   Stenotrophomonas maltophilia NOT DETECTED NOT DETECTED Final   Candida albicans NOT DETECTED NOT DETECTED Final   Candida auris NOT DETECTED NOT DETECTED Final   Candida glabrata NOT DETECTED NOT DETECTED Final   Candida krusei NOT DETECTED NOT DETECTED Final   Candida parapsilosis NOT DETECTED NOT DETECTED Final   Candida  tropicalis NOT DETECTED NOT DETECTED Final   Cryptococcus neoformans/gattii NOT DETECTED NOT DETECTED Final   Meth resistant mecA/C and MREJ NOT DETECTED NOT DETECTED Final    Comment: Performed at [facility], [address] [place], [place] [address]  Aerobic/Anaerobic Culture w Gram Stain (surgical/deep wound)     Status: None (Preliminary result)   Collection Time: 03/13/21 11:50 PM   Specimen: Ankle; Wound  Result Value Ref Range Status   Specimen Description   Final    ANKLE Performed at [facility], [address], [place], [place] [address]    Special Requests   Final    NONE Performed at [facility], [place], [place] [address]    Gram Stain   Final    NO SQUAMOUS EPITHELIAL CELLS SEEN FEW WBC SEEN MODERATE GRAM POSITIVE COCCI Performed at [facility], [place] [address] [place], [place] [address]    Culture PENDING  Incomplete   Report Status PENDING  Incomplete  CULTURE, BLOOD (ROUTINE X 2) w Reflex to ID Panel     Status: None (Preliminary result)   Collection Time: 03/15/21  2:05 AM   Specimen: BLOOD  Result Value Ref [place] Status   Specimen Description BLOOD BLOOD LEFT FOREARM  Final   Special Requests   Final    BOTTLES DRAWN AEROBIC AND ANAEROBIC Blood Culture adequate volume   Culture   Final    NO GROWTH < 12 HOURS Performed at [facility], [address], [place], [place] [address]    Report Status PENDING  Incomplete  CULTURE, BLOOD (ROUTINE X 2) w Reflex to ID Panel     Status: None (Preliminary result)   Collection Time: 03/15/21  4:44 AM   Specimen: BLOOD  Result Value Ref [place] Status   Specimen Description BLOOD BLOOD LEFT FOREARM  Final   Special Requests   Final    BOTTLES DRAWN AEROBIC AND ANAEROBIC Blood Culture adequate volume   Culture   Final    NO GROWTH <12 HOURS Performed at [facility], [address] [place],  [address]    Report Status PENDING   Incomplete         Radiology Studies: DG Abd 1 View  Result Date: 03/15/2021 CLINICAL DATA:  Nausea and vomiting EXAM: ABDOMEN - 1 VIEW COMPARISON:  None. FINDINGS: The bowel gas pattern is normal. No abnormal stool retention. Coarse calcification over the sacrum, usually uterine fibroid. No concerning mass effect or gas collection. IMPRESSION: Normal bowel gas pattern. Electronically Signed  By: Jorje Guild M.D.   On: 03/15/2021 07:38   MR ANKLE LEFT WO CONTRAST  Result Date: 03/14/2021 CLINICAL DATA:  Osteomyelitis EXAM: MRI OF THE LEFT ANKLE WITHOUT CONTRAST TECHNIQUE: Multiplanar, multisequence MR imaging of the ankle was performed. No intravenous contrast was administered. COMPARISON:  Same-day foot radiograph FINDINGS: Bones/Joint/Cartilage There is subtalar malalignment with medially rotated talus consistent with Charcot arthropathy. Subtalar joint effusion. There is bony edema and some low T1 signal in the medial talar head. There is additional periarticular bony edema throughout the hindfoot and midfoot favored to be related to neuropathic arthropathy. Subacute nondisplaced fracture of the talar dome, vertically oriented, extending to the tibiotalar and subtalar joints (sagittal T1 image 10, coronal T2 image 20-22. Mild tibiotalar chondrosis. Ligaments Chronic ATFL sprain/partial tear. the PTFL is intact. No structure resembling the calcaneofibular ligament is seen. Chronic deltoid and spring ligament tearing. These findings are consistent with Charcot arthropathy. Muscles and Tendons There is tenosynovitis of the peroneal tendons above and below the lateral malleolus. There is tenosynovitis and tendinosis of the posterior tibial tendon above and below the medial malleolus. There is thickening of the central bundle plantar fascia with mildly increased signal. The Achilles tendon is intact. Soft tissue There is a soft tissue ulcer along the medial midfoot with underlying signal  abnormality in the talar head. Extensive soft tissue swelling. IMPRESSION: Soft tissue ulcer along the medial midfoot with underlying probable osteomyelitis in the medial talar head. Charcot arthropathy with subtalar malalignment and associated periarticular marrow edema. Subacute nondisplaced vertically oriented talar dome fracture. Large subtalar joint effusion, likely related to chronic joint malalignment/neuropathic arthropathy and the talar fracture. Sterility is indeterminate on MRI. Tenosynovitis of the posterior tibial tendon above and below the medial malleolus. Tenosynovitis of the peroneal tendons above and below the lateral malleolus. Electronically Signed   By: Maurine Simmering M.D.   On: 03/14/2021 12:08   DG Chest Port 1 View  Result Date: 03/14/2021 CLINICAL DATA:  Possible sepsis EXAM: PORTABLE CHEST 1 VIEW COMPARISON:  None. FINDINGS: Cardiac shadow is within normal limits. The lungs are hypoinflated with mild right basilar atelectasis. Elevation of the right hemidiaphragm is noted. Bony structures are within normal limits. IMPRESSION: Mild right basilar atelectasis. Electronically Signed   By: Inez Catalina M.D.   On: 03/14/2021 00:40   DG Foot 2 Views Left  Addendum Date: 03/14/2021   ADDENDUM REPORT: 03/14/2021 00:46 ADDENDUM: Additionally along the medial aspect of the talus there is some suggestion of bony resorption which given the known overlying soft tissue wound may be related to osteomyelitis. MRI may be helpful for further evaluation. Electronically Signed   By: Inez Catalina M.D.   On: 03/14/2021 00:46   Result Date: 03/14/2021 CLINICAL DATA:  Possible sepsis, initial encounter EXAM: LEFT FOOT - 2 VIEW COMPARISON:  None. FINDINGS: Postsurgical changes are noted in the first metatarsal. Degenerative changes in the first MTP joint are seen. Some widening of the talonavicular joint is seen with flattening of the plantar arch identified. These are likely related to more chronic  changes possibly related to Charcot deformity. No acute fracture is seen. Mild soft tissue swelling is noted distally. IMPRESSION: Chronic appearing changes particularly at the talonavicular joint suggestive of early Charcot joint changes. No acute fracture is noted. Electronically Signed: By: Inez Catalina M.D. On: 03/14/2021 00:42        Scheduled Meds:  [MAR Hold] atorvastatin  40 mg Oral Daily   [MAR Hold] enoxaparin (LOVENOX) injection  0.5 mg/kg Subcutaneous Q24H   influenza vac split quadrivalent PF  0.5 mL Intramuscular Tomorrow-1000   [MAR Hold] insulin aspart  0-20 Units Subcutaneous TID AC & HS   [MAR Hold] insulin glargine-yfgn  5 Units Subcutaneous BID   [MAR Hold] metoCLOPramide (REGLAN) injection  10 mg Intravenous Q8H   [MAR Hold] multivitamin with minerals   Oral Daily   Continuous Infusions:  [MAR Hold]  ceFAZolin (ANCEF) IV 2 g (03/15/21 0536)   lactated ringers 125 mL/hr at 03/15/21 1016   [MAR Hold] promethazine (PHENERGAN) injection (IM or IVPB) Stopped (03/14/21 1515)     LOS: 1 day    Time spent: 35 minutes    Sidney Ace, MD [facility]   If 7PM-7AM, please contact night-coverage  03/15/2021, 11:05 AM

## 2021-03-15 NOTE — Anesthesia Procedure Notes (Signed)
Procedure Name: Intubation Date/Time: 03/15/2021 10:26 AM Performed by: Hedda Slade, CRNA Pre-anesthesia Checklist: Patient identified, Patient being monitored, Timeout performed, Emergency Drugs available and Suction available Patient Re-evaluated:Patient Re-evaluated prior to induction Oxygen Delivery Method: Circle system utilized Preoxygenation: Pre-oxygenation with 100% oxygen Induction Type: IV induction Ventilation: Mask ventilation without difficulty Laryngoscope Size: 3 and McGraph Grade View: Grade I Tube type: Oral Tube size: 7.0 mm Number of attempts: 1 Airway Equipment and Method: Stylet Placement Confirmation: ETT inserted through vocal cords under direct vision, positive ETCO2 and breath sounds checked- equal and bilateral Secured at: 21 cm Tube secured with: Tape Dental Injury: Teeth and Oropharynx as per pre-operative assessment

## 2021-03-15 NOTE — Progress Notes (Signed)
Subjective: Interval History: has complaints left lower extremity discomfort, nausea...  Has agrees at this point for left below-knee amputation, hopefully with closure, unless there is purulence extending up the compartments.  I did discuss this with her today.  We will plan on getting her to the OR as soon as possible at this point behind the currently scheduled cases.  Objective: Vital signs in last 24 hours: Temp:  [98.3 F (36.8 C)-99.9 F (37.7 C)] 98.9 F (37.2 C) (12/18 0646) Pulse Rate:  [106-134] 112 (12/18 0646) Resp:  [22-36] 28 (12/18 0646) BP: (86-131)/(52-84) 116/72 (12/18 0646) SpO2:  [91 %-99 %] 98 % (12/18 0646)  Intake/Output from previous day: 12/17 0701 - 12/18 0700 In: 3800 [I.V.:1000; IV Piggyback:2800] Out: 1250 [Urine:1250] Intake/Output this shift: No intake/output data recorded.  General appearance: alert, cooperative, and moderate distress Extremities: Left lower extremity wrapped, some swelling.  No gross edema.  Lab Results: Recent Labs    03/14/21 0443 03/15/21 0444  WBC 17.7* 12.2*  HGB 9.5* 9.5*  HCT 28.9* 28.9*  PLT 154 175   BMET Recent Labs    03/14/21 0443 03/15/21 0444  NA 128* 130*  K 4.2 3.7  CL 94* 98  CO2 24 25  GLUCOSE 392* 275*  BUN 41* 30*  CREATININE 1.85* 1.23*  CALCIUM 8.0* 8.8*    Studies/Results: MR ANKLE LEFT WO CONTRAST  Result Date: 03/14/2021 CLINICAL DATA:  Osteomyelitis EXAM: MRI OF THE LEFT ANKLE WITHOUT CONTRAST TECHNIQUE: Multiplanar, multisequence MR imaging of the ankle was performed. No intravenous contrast was administered. COMPARISON:  Same-day foot radiograph FINDINGS: Bones/Joint/Cartilage There is subtalar malalignment with medially rotated talus consistent with Charcot arthropathy. Subtalar joint effusion. There is bony edema and some low T1 signal in the medial talar head. There is additional periarticular bony edema throughout the hindfoot and midfoot favored to be related to neuropathic  arthropathy. Subacute nondisplaced fracture of the talar dome, vertically oriented, extending to the tibiotalar and subtalar joints (sagittal T1 image 10, coronal T2 image 20-22. Mild tibiotalar chondrosis. Ligaments Chronic ATFL sprain/partial tear. the PTFL is intact. No structure resembling the calcaneofibular ligament is seen. Chronic deltoid and spring ligament tearing. These findings are consistent with Charcot arthropathy. Muscles and Tendons There is tenosynovitis of the peroneal tendons above and below the lateral malleolus. There is tenosynovitis and tendinosis of the posterior tibial tendon above and below the medial malleolus. There is thickening of the central bundle plantar fascia with mildly increased signal. The Achilles tendon is intact. Soft tissue There is a soft tissue ulcer along the medial midfoot with underlying signal abnormality in the talar head. Extensive soft tissue swelling. IMPRESSION: Soft tissue ulcer along the medial midfoot with underlying probable osteomyelitis in the medial talar head. Charcot arthropathy with subtalar malalignment and associated periarticular marrow edema. Subacute nondisplaced vertically oriented talar dome fracture. Large subtalar joint effusion, likely related to chronic joint malalignment/neuropathic arthropathy and the talar fracture. Sterility is indeterminate on MRI. Tenosynovitis of the posterior tibial tendon above and below the medial malleolus. Tenosynovitis of the peroneal tendons above and below the lateral malleolus. Electronically Signed   By: Maurine Simmering M.D.   On: 03/14/2021 12:08   DG Chest Port 1 View  Result Date: 03/14/2021 CLINICAL DATA:  Possible sepsis EXAM: PORTABLE CHEST 1 VIEW COMPARISON:  None. FINDINGS: Cardiac shadow is within normal limits. The lungs are hypoinflated with mild right basilar atelectasis. Elevation of the right hemidiaphragm is noted. Bony structures are within normal limits. IMPRESSION: Mild right  basilar  atelectasis. Electronically Signed   By: Inez Catalina M.D.   On: 03/14/2021 00:40   DG Foot 2 Views Left  Addendum Date: 03/14/2021   ADDENDUM REPORT: 03/14/2021 00:46 ADDENDUM: Additionally along the medial aspect of the talus there is some suggestion of bony resorption which given the known overlying soft tissue wound may be related to osteomyelitis. MRI may be helpful for further evaluation. Electronically Signed   By: Inez Catalina M.D.   On: 03/14/2021 00:46   Result Date: 03/14/2021 CLINICAL DATA:  Possible sepsis, initial encounter EXAM: LEFT FOOT - 2 VIEW COMPARISON:  None. FINDINGS: Postsurgical changes are noted in the first metatarsal. Degenerative changes in the first MTP joint are seen. Some widening of the talonavicular joint is seen with flattening of the plantar arch identified. These are likely related to more chronic changes possibly related to Charcot deformity. No acute fracture is seen. Mild soft tissue swelling is noted distally. IMPRESSION: Chronic appearing changes particularly at the talonavicular joint suggestive of early Charcot joint changes. No acute fracture is noted. Electronically Signed: By: Inez Catalina M.D. On: 03/14/2021 00:42   Anti-infectives: Anti-infectives (From admission, onward)    Start     Dose/Rate Route Frequency Ordered Stop   03/14/21 2200  ceFAZolin (ANCEF) IVPB 2g/100 mL premix        2 g 200 mL/hr over 30 Minutes Intravenous Every 8 hours 03/14/21 1325     03/14/21 1100  ceFEPIme (MAXIPIME) 2 g in sodium chloride 0.9 % 100 mL IVPB  Status:  Discontinued        2 g 200 mL/hr over 30 Minutes Intravenous Every 12 hours 03/14/21 0238 03/14/21 1323   03/14/21 0240  vancomycin variable dose per unstable renal function (pharmacist dosing)  Status:  Discontinued         Does not apply See admin instructions 03/14/21 0241 03/14/21 1324   03/14/21 0200  metroNIDAZOLE (FLAGYL) IVPB 500 mg  Status:  Discontinued        500 mg 100 mL/hr over 60 Minutes  Intravenous Every 12 hours 03/14/21 0126 03/14/21 1324   03/14/21 0130  ceFEPIme (MAXIPIME) 2 g in sodium chloride 0.9 % 100 mL IVPB  Status:  Discontinued        2 g 200 mL/hr over 30 Minutes Intravenous  Once 03/14/21 0126 03/14/21 0129   03/14/21 0130  vancomycin (VANCOCIN) IVPB 1000 mg/200 mL premix  Status:  Discontinued        1,000 mg 200 mL/hr over 60 Minutes Intravenous  Once 03/14/21 0126 03/14/21 0131   03/14/21 0045  ceFEPIme (MAXIPIME) 2 g in sodium chloride 0.9 % 100 mL IVPB        2 g 200 mL/hr over 30 Minutes Intravenous  Once 03/14/21 0032 03/14/21 0117   03/14/21 0045  vancomycin (VANCOCIN) IVPB 1000 mg/200 mL premix  Status:  Discontinued        1,000 mg 200 mL/hr over 60 Minutes Intravenous  Once 03/14/21 0032 03/14/21 0033   03/14/21 0045  vancomycin (VANCOREADY) IVPB 2000 mg/400 mL        2,000 mg 200 mL/hr over 120 Minutes Intravenous  Once 03/14/21 0033 03/14/21 0251       Assessment/Plan: s/p Procedure(s): AMPUTATION BELOW KNEE (Left)  At this point time we will plan for left below-knee amputation, hopefully with closure unless there is purulence extending up the compartments from the malleolar position. In this case a guillotine amputation will be performed with wound VAC  And I discussed the risks including pain, bleeding, infection, failure to heal, other untoward event.  Her diabetes certainly is an issue here.  She understands this, and agrees to proceed.    Clinically she remains septic, although her white count is slightly improved.  We went over the fact that she will not improve until her foot is removed.  She understands.  OR has been notified.   LOS: 1 day   Zara Chess 03/15/2021, 7:22 AM

## 2021-03-15 NOTE — Progress Notes (Signed)
°   03/14/21 2246  Assess: MEWS Score  Temp 99 F (37.2 C)  BP 107/68  Pulse Rate (!) 115  ECG Heart Rate (!) 116  Resp (!) 31  SpO2 97 %  O2 Device Nasal Cannula  O2 Flow Rate (L/min) 2 L/min  Assess: MEWS Score  MEWS Temp 0  MEWS Systolic 0  MEWS Pulse 2  MEWS RR 2  MEWS LOC 0  MEWS Score 4  MEWS Score Color Red  Assess: if the MEWS score is Yellow or Red  Were vital signs taken at a resting state? Yes  Focused Assessment No change from prior assessment  Does the patient meet 2 or more of the SIRS criteria? Yes  Does the patient have a confirmed or suspected source of infection? Yes  Provider and Rapid Response Notified? Yes (Provider notified)  MEWS guidelines implemented *See Row Information* Yes  Treat  MEWS Interventions Escalated (See documentation below)  Pain Scale 0-10  Pain Score 0  Take Vital Signs  Increase Vital Sign Frequency  Red: Q 1hr X 4 then Q 4hr X 4, if remains red, continue Q 4hrs  Escalate  MEWS: Escalate Red: discuss with charge nurse/RN and provider, consider discussing with RRT  Notify: Charge Nurse/RN  Name of Charge Nurse/RN Notified Chelsea RN  Date Charge Nurse/RN Notified 03/14/21  Time Charge Nurse/RN Notified 2315  Notify: Provider  Provider Name/Title Dr. Hal Hope  Date Provider Notified 03/14/21  Time Provider Notified 2330  Notification Type  (epic secure chat)  Notification Reason Other (Comment) (MEWS RED 4)  Provider response No new orders  Date of Provider Response 03/14/21  Time of Provider Response 2335  Notify: Rapid Response  Name of Rapid Response RN Notified  (Rapid response not needed at this time)  Document  Patient Outcome Other (Comment) (Stable, no interventions needed at this time)  Progress note created (see row info) Yes  Assess: SIRS CRITERIA  SIRS Temperature  0  SIRS Pulse 1  SIRS Respirations  1  SIRS WBC 0  SIRS Score Sum  2

## 2021-03-16 ENCOUNTER — Inpatient Hospital Stay (HOSPITAL_COMMUNITY)
Admit: 2021-03-16 | Discharge: 2021-03-16 | Disposition: A | Discharge status: Home / Self Care | Payer: BC Managed Care – PPO | Attending: Specialist | Admitting: Specialist

## 2021-03-16 ENCOUNTER — Encounter: Payer: Self-pay | Admitting: Specialist

## 2021-03-16 DIAGNOSIS — E11628 Type 2 diabetes mellitus with other skin complications: Secondary | ICD-10-CM

## 2021-03-16 DIAGNOSIS — M86171 Other acute osteomyelitis, right ankle and foot: Secondary | ICD-10-CM | POA: Diagnosis not present

## 2021-03-16 DIAGNOSIS — L089 Local infection of the skin and subcutaneous tissue, unspecified: Secondary | ICD-10-CM

## 2021-03-16 DIAGNOSIS — R7881 Bacteremia: Secondary | ICD-10-CM

## 2021-03-16 LAB — GLUCOSE, CAPILLARY
Glucose-Capillary: 250 mg/dL — ABNORMAL HIGH (ref 70–99)
Glucose-Capillary: 158 mg/dL — ABNORMAL HIGH (ref 70–99)
Glucose-Capillary: 165 mg/dL — ABNORMAL HIGH (ref 70–99)
Glucose-Capillary: 171 mg/dL — ABNORMAL HIGH (ref 70–99)
Glucose-Capillary: 149 mg/dL — ABNORMAL HIGH (ref 70–99)

## 2021-03-16 LAB — ECHOCARDIOGRAM COMPLETE
AR max vel: 1.21 cm2
AV Area VTI: 1.39 cm2
AV Area mean vel: 1.16 cm2
AV Mean grad: 4 mmHg
AV Peak grad: 7.8 mmHg
Ao pk vel: 1.4 m/s
Area-P 1/2: 6.71 cm2
Height: 66 in
MV VTI: 1.53 cm2
S' Lateral: 3.09 cm
Weight: 3680 oz

## 2021-03-16 LAB — BASIC METABOLIC PANEL
Anion gap: 5 (ref 5–15)
BUN: 29 mg/dL — ABNORMAL HIGH (ref 6–20)
CO2: 23 mmol/L (ref 22–32)
Calcium: 8 mg/dL — ABNORMAL LOW (ref 8.9–10.3)
Chloride: 105 mmol/L (ref 98–111)
Creatinine, Ser: 1.34 mg/dL — ABNORMAL HIGH (ref 0.44–1.00)
GFR, Estimated: 45 mL/min — ABNORMAL LOW (ref >60)
Glucose, Bld: 141 mg/dL — ABNORMAL HIGH (ref 70–99)
Potassium: 3.7 mmol/L (ref 3.5–5.1)
Sodium: 133 mmol/L — ABNORMAL LOW (ref 135–145)

## 2021-03-16 LAB — HEMOGLOBIN A1C
Hgb A1c MFr Bld: 10.5 % — ABNORMAL HIGH (ref 4.8–5.6)
Mean Plasma Glucose: 255 mg/dL

## 2021-03-16 MED ORDER — MORPHINE SULFATE (PF) 2 MG/ML IV SOLN: 2.0000 mg | INTRAVENOUS | Status: DC | PRN

## 2021-03-16 MED ORDER — LORAZEPAM 2 MG/ML IJ SOLN
1.0000 mg | Freq: Once | INTRAMUSCULAR | Status: AC
  Administered 2021-03-16: 12:00:00 1 mg via INTRAVENOUS
  Filled 2021-03-16: qty 1

## 2021-03-16 MED ORDER — INSULIN GLARGINE-YFGN 100 UNIT/ML ~~LOC~~ SOLN
8.0000 [IU] | Freq: Two times a day (BID) | SUBCUTANEOUS | Status: DC
  Administered 2021-03-16 – 2021-03-27 (×22): 8 [IU] via SUBCUTANEOUS
  Filled 2021-03-16 (×24): qty 0.08

## 2021-03-16 MED ORDER — ALUM & MAG HYDROXIDE-SIMETH 200-200-20 MG/5ML PO SUSP: 30.0000 mL | ORAL | Status: DC | PRN

## 2021-03-16 MED ORDER — PANTOPRAZOLE SODIUM 40 MG IV SOLR
40.0000 mg | Freq: Two times a day (BID) | INTRAVENOUS | Status: DC
  Administered 2021-03-16 – 2021-03-22 (×13): 40 mg via INTRAVENOUS
  Filled 2021-03-16 (×13): qty 40

## 2021-03-16 MED ORDER — ONDANSETRON HCL 4 MG/2ML IJ SOLN
4.0000 mg | Freq: Four times a day (QID) | INTRAMUSCULAR | Status: DC | PRN
  Administered 2021-03-16: 10:00:00 4 mg via INTRAVENOUS
  Filled 2021-03-16: qty 2

## 2021-03-16 MED ORDER — SCOPOLAMINE 1 MG/3DAYS TD PT72
1.0000 | MEDICATED_PATCH | TRANSDERMAL | Status: DC
  Administered 2021-03-16 – 2021-03-19 (×2): 1.5 mg via TRANSDERMAL
  Filled 2021-03-16 (×3): qty 1

## 2021-03-16 NOTE — Progress Notes (Signed)
[facility] Daily Progress Note  03/15/21: Left below-the-knee amputation  Subjective: Patient without complaint this afternoon.  No acute issues overnight.  States her current pain medication regimen is adequate.  Objective: Vitals:   03/16/21 0506 03/16/21 0510 03/16/21 0804 03/16/21 1135  BP: 98/64  102/67 (!) 104/57  Pulse: 97  (!) 101 (!) 104  Resp: 16  18 18   Temp: 98.8 F (37.1 C)  98.8 F (37.1 C) 98.1 F (36.7 C)  TempSrc: Oral  Oral Oral  SpO2: 99% 96% 93% 95%  Weight:      Height:        Intake/Output Summary (Last 24 hours) at 03/16/2021 1508 Last data filed at 03/16/2021 0500 Gross per 24 hour  Intake 1500 ml  Output 600 ml  Net 900 ml   Physical Exam: A&Ox3, NAD CV: RRR Pulmonary: CTA Bilaterally Abdomen: Soft, Nontender, Nondistended Vascular:  Left lower extremity: Thigh soft.  Knee flexible.  Operating room dressing is clean dry and intact.   Laboratory: CBC    Component Value Date/Time   WBC 12.2 (H) 03/15/2021 0444   HGB 9.5 (L) 03/15/2021 0444   HCT 28.9 (L) 03/15/2021 0444   PLT 175 03/15/2021 0444   BMET    Component Value Date/Time   NA 133 (L) 03/16/2021 0452   K 3.7 03/16/2021 0452   CL 105 03/16/2021 0452   CO2 23 03/16/2021 0452   GLUCOSE 141 (H) 03/16/2021 0452   BUN 29 (H) 03/16/2021 0452   CREATININE 1.34 (H) 03/16/2021 0452   CREATININE 0.93 09/15/2018 1545   CALCIUM 8.0 (L) 03/16/2021 0452   GFRNONAA 45 (L) 03/16/2021 0452   GFRAA >60 11/11/2018 0356   Assessment/Planning: The patient is a 60 year old female who presented with left leg gangrene status post left below the knee amputation - 03/15/21  1) hemoglobin is stable 2) elevated creatinine.  Fluids still running. AM BMP. Monitor. 3) will plan on removing operating room dressing tomorrow 4) encourage the patient to try her best to work with physical and Occupational Therapy  Discussed with Dr. Eber Hong Phi Avans  PA-C 03/16/2021 3:08 PM

## 2021-03-16 NOTE — Progress Notes (Signed)
PROGRESS NOTE    Rebecca Park  MCN:[id number] DOB: Aug 04, 1960 DOA: 03/13/2021 PCP: Marinda Elk, MD    Brief Narrative:  60 year old with history of type 2 diabetes on glipizide, hypertension, Charcot foot with about 4 months of medial malleoli infection and open ulceration under conservative management by Dr. Luana Shu presented with infected purulent drainage and surrounding erythema, nausea vomiting.   Severe sepsis present on admission, tachycardic, acute kidney injury, lactic acidosis, x-ray with possible osteomyelitis  Seen in consultation by podiatry and vascular surgery.  Podiatry felt that limb was not salvageable.  Signed off and involve vascular surgery.  After vascular surgery evaluation patient initially refused amputation however now agreeable.  Plan for amputation on 12/18.  Discussed with vascular surgery.  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  Tolerated procedure well.  Postprocedural pain well controlled.  Blood culture positive for staph aureus.   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 Plan: Multimodal pain control IV antibiotics Antiemetics Monitor vitals and fever curve Vascular follow-up Therapy evaluations  Staff aureus bacteremia Source likely infected wound Patient hemodynamically stable with improving sepsis physiology TTE negative Surveillance cultures drawn, no growth to date Plan: Continue Ancef Follow cultures Monitor vitals and fever curve  Hyperlipidemia PTA statin  Insulin-dependent diabetes mellitus Increase Semglee to 8 units daily Resistant sliding scale Carb modified diet once diet advanced  Essential hypertension PTA Norvasc   DVT prophylaxis: SQ  Lovenox Code Status: Full code Family Communication: Spouse at bedside 12/18 Disposition Plan: Status is: Inpatient  Remains inpatient appropriate because: Left lower extremity osteomyelitis.  Status post left BKA      Level of care: Med-Surg  Consultants:  Vascular surgery  Procedures:  L LE amputation 12/18  Antimicrobials: Cefepime Vancomycin   Subjective: Seen and examined.  Continues to endorse nausea.  Pain well controlled  Objective: Vitals:   03/16/21 0500 03/16/21 0506 03/16/21 0510 03/16/21 0804  BP:  98/64  102/67  Pulse:  97  (!) 101  Resp:  16  18  Temp:  98.8 F (37.1 C)  98.8 F (37.1 C)  TempSrc: Oral Oral  Oral  SpO2:  99% 96% 93%  Weight:      Height:        Intake/Output Summary (Last 24 hours) at 03/16/2021 1029 Last data filed at 03/16/2021 0500 Gross per 24 hour  Intake 2600 ml  Output 650 ml  Net 1950 ml   Filed Weights   03/13/21 2339  Weight: 104.3 kg    Examination:  General exam: No acute distress. Respiratory system: Lungs clear.  Normal work of breathing.  Room air Cardiovascular system: S1-S2, RRR, no murmurs, no pedal edema Gastrointestinal system: Soft, NT/ND, normal bowel sounds Central nervous system: Alert and oriented. No focal neurological deficits. Extremities: Status post left BKA Skin: Left BKA Psychiatry: Judgement and insight appear normal. Mood & affect appropriate.     Data Reviewed: I have personally reviewed following labs and imaging studies  CBC: Recent Labs  Lab 03/13/21 2349 03/14/21 0443 03/15/21 0444  WBC 18.1* 17.7* 12.2*  NEUTROABS 16.8*  --  10.9*  HGB 10.5* 9.5* 9.5*  HCT 32.1* 28.9* 28.9*  MCV 84.0 84.3 83.8  PLT 177 154 629   Basic Metabolic Panel: Recent Labs  Lab  03/13/21 2349 03/14/21 0443 03/15/21 0444 03/16/21 0452  NA 128* 128* 130* 133*  K 3.8 4.2 3.7 3.7  CL 92* 94* 98 105  CO2 24 24 25 23   GLUCOSE 390* 392* 275* 141*  BUN 44* 41* 30* 29*  CREATININE 2.15*  1.85* 1.23* 1.34*  CALCIUM 8.4* 8.0* 8.8* 8.0*  MG 2.0  --  2.0  --   PHOS  --   --  2.6  --    GFR: Estimated Creatinine Clearance: 54.5 mL/min (A) (by C-G formula based on SCr of 1.34 mg/dL (H)). Liver Function Tests: Recent Labs  Lab 03/13/21 2349 03/15/21 0248  AST 25 21  ALT 23 18  ALKPHOS 213* 148*  BILITOT 1.9* 1.6*  PROT 6.8 5.9*  ALBUMIN 2.7* 2.2*   No results for input(s): LIPASE, AMYLASE in the last 168 hours. No results for input(s): AMMONIA in the last 168 hours. Coagulation Profile: Recent Labs  Lab 03/13/21 2349 03/14/21 0443  INR 1.1 1.2   Cardiac Enzymes: No results for input(s): CKTOTAL, CKMB, CKMBINDEX, TROPONINI in the last 168 hours. BNP (last 3 results) No results for input(s): PROBNP in the last 8760 hours. HbA1C: No results for input(s): HGBA1C in the last 72 hours. CBG: Recent Labs  Lab 03/15/21 1158 03/15/21 1311 03/15/21 1723 03/15/21 2109 03/16/21 0806  GLUCAP 261* 251* 276* 241* 158*   Lipid Profile: No results for input(s): CHOL, HDL, LDLCALC, TRIG, CHOLHDL, LDLDIRECT in the last 72 hours. Thyroid Function Tests: No results for input(s): TSH, T4TOTAL, FREET4, T3FREE, THYROIDAB in the last 72 hours. Anemia Panel: No results for input(s): VITAMINB12, FOLATE, FERRITIN, TIBC, IRON, RETICCTPCT in the last 72 hours. Sepsis Labs: Recent Labs  Lab 03/14/21 0443 03/14/21 0748 03/15/21 0204 03/15/21 0444  PROCALCITON 4.26  --   --   --   LATICACIDVEN 1.7 1.6 1.6 1.6    Recent Results (from the past 240 hour(s))  Culture, blood (Routine x 2)     Status: Abnormal (Preliminary result)   Collection Time: 03/13/21 11:49 PM   Specimen: BLOOD  Result Value Ref Range Status   Specimen Description   Final    BLOOD LEFT ASSIST CONTROL Performed at [facility], [address] [place], [place] [address]    Special Requests   Final    BOTTLES DRAWN AEROBIC AND ANAEROBIC Blood Culture adequate volume Performed at [facility], [address] [place], [place] [address]    Culture  Setup Time   Final    GRAM POSITIVE COCCI IN BOTH AEROBIC AND ANAEROBIC BOTTLES CRITICAL RESULT CALLED TO, READ BACK BY AND VERIFIED WITH: ALEX CHAPPELL AT 4174 03/14/21.PMF    Culture (A)  Final    STAPHYLOCOCCUS AUREUS REPEATING SENSITIVITY Performed at [facility], [place] [address], [place], [place] [address]    Report Status PENDING  Incomplete  Culture, blood (Routine x 2)     Status: Abnormal (Preliminary result)   Collection Time: 03/13/21 11:49 PM   Specimen: BLOOD  Result Value Ref Range Status   Specimen Description   Final    BLOOD RIGHT FOREARM Performed at [facility], [address] [place], [place] [address]    Special Requests   Final    BOTTLES DRAWN AEROBIC AND ANAEROBIC Blood Culture adequate volume Performed at [place] [place], [place] [address]    Culture  Setup Time   Final    GRAM POSITIVE COCCI IN BOTH AEROBIC AND ANAEROBIC  BOTTLES CRITICAL RESULT CALLED TO, READ BACK BY AND VERIFIED WITH: ALEX CHAPPELL AT [place] 03/14/21.PMF Performed at [facility], [address], [place], [place] [address]    Culture STAPHYLOCOCCUS AUREUS (A)  Final   Report Status PENDING  Incomplete  Resp Panel by RT-PCR (Flu A&B, Covid) Nasopharyngeal Swab     Status: None   Collection Time: 03/13/21 11:49 PM   Specimen: Nasopharyngeal Swab; Nasopharyngeal(NP) swabs in vial transport medium  Result Value Ref Range Status   SARS Coronavirus 2 by RT PCR NEGATIVE NEGATIVE Final    Comment: (NOTE) SARS-CoV-2 target nucleic acids are NOT DETECTED.  The SARS-CoV-2 RNA is generally detectable in upper respiratory specimens during the acute phase of infection. The lowest concentration of SARS-CoV-2 viral copies this assay can detect is 138 copies/mL. A negative result does not preclude SARS-Cov-2 infection and should not be used as the sole basis for  treatment or other patient management decisions. A negative result may occur with  improper specimen collection/handling, submission of specimen other than nasopharyngeal swab, presence of viral mutation(s) within the areas targeted by this assay, and inadequate number of viral copies(<138 copies/mL). A negative result must be combined with clinical observations, patient history, and epidemiological information. The expected result is Negative.  Fact Sheet for Patients:  EntrepreneurPulse.com.au  Fact Sheet for Healthcare Providers:  IncredibleEmployment.be  This test is no t yet approved or cleared by the [place] FDA and  has been authorized for detection and/or diagnosis of SARS-CoV-2 by FDA under an Emergency Use Authorization (EUA). This EUA will remain  in effect (meaning this test can be used) for the duration of the COVID-19 declaration under Section 564(b)(1) of the Act, 21 U.S.C.section 360bbb-3(b)(1), unless the authorization is terminated  or revoked sooner.       Influenza A by PCR NEGATIVE NEGATIVE Final   Influenza B by PCR NEGATIVE NEGATIVE Final    Comment: (NOTE) The Xpert Xpress SARS-CoV-2/FLU/RSV plus assay is intended as an aid in the diagnosis of influenza from Nasopharyngeal swab specimens and should not be used as a sole basis for treatment. Nasal washings and aspirates are unacceptable for Xpert Xpress SARS-CoV-2/FLU/RSV testing.  Fact Sheet for Patients: EntrepreneurPulse.com.au  Fact Sheet for Healthcare Providers: IncredibleEmployment.be  This test is not yet approved or cleared by the [place] FDA and has been authorized for detection and/or diagnosis of SARS-CoV-2 by FDA under an Emergency Use Authorization (EUA). This EUA will remain in effect (meaning this test can be used) for the duration of the COVID-19 declaration under Section 564(b)(1) of the Act, 21  U.S.C. section 360bbb-3(b)(1), unless the authorization is terminated or revoked.  Performed at [facility], [address] [place], [place] [address]   Blood Culture ID Panel (Reflexed)     Status: Abnormal   Collection Time: 03/13/21 11:49 PM  Result Value Ref Range Status   Enterococcus faecalis NOT DETECTED NOT DETECTED Final   Enterococcus Faecium NOT DETECTED NOT DETECTED Final   Listeria monocytogenes NOT DETECTED NOT DETECTED Final   Staphylococcus species DETECTED (A) NOT DETECTED Final    Comment: CRITICAL RESULT CALLED TO, READ BACK BY AND VERIFIED WITH: ALEX CHAPPELL AT 1314 03/14/21.PMF    Staphylococcus aureus (BCID) DETECTED (A) NOT DETECTED Final    Comment: CRITICAL RESULT CALLED TO, READ BACK BY AND VERIFIED WITH: ALEX CHAPPELL AT 1314 03/14/21.PMF    Staphylococcus epidermidis NOT DETECTED NOT DETECTED Final   Staphylococcus lugdunensis NOT DETECTED NOT DETECTED Final  Streptococcus species NOT DETECTED NOT DETECTED Final   Streptococcus agalactiae NOT DETECTED NOT DETECTED Final   Streptococcus pneumoniae NOT DETECTED NOT DETECTED Final   Streptococcus pyogenes NOT DETECTED NOT DETECTED Final   A.calcoaceticus-baumannii NOT DETECTED NOT DETECTED Final   Bacteroides fragilis NOT DETECTED NOT DETECTED Final   Enterobacterales NOT DETECTED NOT DETECTED Final   Enterobacter cloacae complex NOT DETECTED NOT DETECTED Final   Escherichia coli NOT DETECTED NOT DETECTED Final   Klebsiella aerogenes NOT DETECTED NOT DETECTED Final   Klebsiella oxytoca NOT DETECTED NOT DETECTED Final   Klebsiella pneumoniae NOT DETECTED NOT DETECTED Final   Proteus species NOT DETECTED NOT DETECTED Final   Salmonella species NOT DETECTED NOT DETECTED Final   Serratia marcescens NOT DETECTED NOT DETECTED Final   Haemophilus influenzae NOT DETECTED NOT DETECTED Final   Neisseria meningitidis NOT DETECTED NOT DETECTED Final   Pseudomonas aeruginosa NOT DETECTED NOT DETECTED  Final   Stenotrophomonas maltophilia NOT DETECTED NOT DETECTED Final   Candida albicans NOT DETECTED NOT DETECTED Final   Candida auris NOT DETECTED NOT DETECTED Final   Candida glabrata NOT DETECTED NOT DETECTED Final   Candida krusei NOT DETECTED NOT DETECTED Final   Candida parapsilosis NOT DETECTED NOT DETECTED Final   Candida tropicalis NOT DETECTED NOT DETECTED Final   Cryptococcus neoformans/gattii NOT DETECTED NOT DETECTED Final   Meth resistant mecA/C and MREJ NOT DETECTED NOT DETECTED Final    Comment: Performed at [facility], [address] [place], [place] [address]  Aerobic/Anaerobic Culture w Gram Stain (surgical/deep wound)     Status: None (Preliminary result)   Collection Time: 03/13/21 11:50 PM   Specimen: Ankle; Wound  Result Value Ref Range Status   Specimen Description   Final    ANKLE Performed at [facility], [address], [place], [place] [address]    Special Requests   Final    NONE Performed at [facility], [address] [place], [place] [address]    Gram Stain   Final    NO SQUAMOUS EPITHELIAL CELLS SEEN FEW WBC SEEN MODERATE GRAM POSITIVE COCCI    Culture   Final    MODERATE STAPHYLOCOCCUS AUREUS CULTURE REINCUBATED FOR BETTER GROWTH SUSCEPTIBILITIES TO FOLLOW Performed at [facility], [place] [address] [place], [place] [address]    Report Status PENDING  Incomplete  Urine Culture     Status: None   Collection Time: 03/14/21 10:27 AM   Specimen: Urine, Random  Result Value Ref Range Status   Specimen Description   Final    URINE, RANDOM Performed at [facility], [address], [place], [place] [address]    Special Requests   Final    NONE Performed at [facility], [address], [place], [place] [address]    Culture   Final    NO GROWTH Performed at [facility], [place] [address], [place], [place] [address]    Report Status 03/15/2021 FINAL  Final  CULTURE, BLOOD  (ROUTINE X 2) w Reflex to ID Panel     Status: None (Preliminary result)   Collection Time: 03/15/21  2:05 AM   Specimen: BLOOD  Result Value Ref Range Status   Specimen Description BLOOD BLOOD LEFT FOREARM  Final   Special Requests   Final    BOTTLES DRAWN AEROBIC AND ANAEROBIC Blood Culture adequate volume   Culture   Final    NO GROWTH 1 DAY Performed at [facility],  [place], [place] [address]    Report Status PENDING  Incomplete  CULTURE, BLOOD (ROUTINE X 2) w Reflex to ID Panel     Status: None (Preliminary result)   Collection Time: 03/15/21  4:44 AM   Specimen: BLOOD  Result Value Ref Range Status   Specimen Description BLOOD BLOOD LEFT FOREARM  Final   Special Requests   Final    BOTTLES DRAWN AEROBIC AND ANAEROBIC Blood Culture adequate volume   Culture   Final    NO GROWTH 1 DAY Performed at [facility], [address] [place], [place] [address]    Report Status PENDING  Incomplete         Radiology Studies: DG Abd 1 View  Result Date: 03/15/2021 CLINICAL DATA:  Nausea and vomiting EXAM: ABDOMEN - 1 VIEW COMPARISON:  None. FINDINGS: The bowel gas pattern is normal. No abnormal stool retention. Coarse calcification over the sacrum, usually uterine fibroid. No concerning mass effect or gas collection. IMPRESSION: Normal bowel gas pattern. Electronically Signed   By: Jorje Guild M.D.   On: 03/15/2021 07:38   MR ANKLE LEFT WO CONTRAST  Result Date: 03/14/2021 CLINICAL DATA:  Osteomyelitis EXAM: MRI OF THE LEFT ANKLE WITHOUT CONTRAST TECHNIQUE: Multiplanar, multisequence MR imaging of the ankle was performed. No intravenous contrast was administered. COMPARISON:  Same-day foot radiograph FINDINGS: Bones/Joint/Cartilage There is subtalar malalignment with medially rotated talus consistent with Charcot arthropathy. Subtalar joint effusion. There is bony edema and some low T1 signal in the medial talar head. There is additional  periarticular bony edema throughout the hindfoot and midfoot favored to be related to neuropathic arthropathy. Subacute nondisplaced fracture of the talar dome, vertically oriented, extending to the tibiotalar and subtalar joints (sagittal T1 image 10, coronal T2 image 20-22. Mild tibiotalar chondrosis. Ligaments Chronic ATFL sprain/partial tear. the PTFL is intact. No structure resembling the calcaneofibular ligament is seen. Chronic deltoid and spring ligament tearing. These findings are consistent with Charcot arthropathy. Muscles and Tendons There is tenosynovitis of the peroneal tendons above and below the lateral malleolus. There is tenosynovitis and tendinosis of the posterior tibial tendon above and below the medial malleolus. There is thickening of the central bundle plantar fascia with mildly increased signal. The Achilles tendon is intact. Soft tissue There is a soft tissue ulcer along the medial midfoot with underlying signal abnormality in the talar head. Extensive soft tissue swelling. IMPRESSION: Soft tissue ulcer along the medial midfoot with underlying probable osteomyelitis in the medial talar head. Charcot arthropathy with subtalar malalignment and associated periarticular marrow edema. Subacute nondisplaced vertically oriented talar dome fracture. Large subtalar joint effusion, likely related to chronic joint malalignment/neuropathic arthropathy and the talar fracture. Sterility is indeterminate on MRI. Tenosynovitis of the posterior tibial tendon above and below the medial malleolus. Tenosynovitis of the peroneal tendons above and below the lateral malleolus. Electronically Signed   By: Maurine Simmering M.D.   On: 03/14/2021 12:08   ECHOCARDIOGRAM COMPLETE  Result Date: 03/16/2021    ECHOCARDIOGRAM REPORT   Patient Name:   Rebecca Park Date of Exam: 03/16/2021 Medical Rec #:  [id number]      Height:       66.0 in Accession #:    [id number]     Weight:       230.0 lb Date of Birth:  02-17-1961        BSA:          2.122 m Patient Age:    28  years       BP:           98/64 mmHg Patient Gender: F              HR:           113 bpm. Exam Location:  [facility] Procedure: 2D Echo, Color Doppler and Cardiac Doppler Indications:     R78.81 Bacteremia  History:         Patient has prior history of Echocardiogram examinations. Risk                  Factors:Hypertension and Diabetes.  Sonographer:     Charmayne Sheer Referring Phys:  [id number] Zara Chess Diagnosing Phys: Kathlyn Sacramento MD  Sonographer Comments: Technically difficult study due to poor echo windows and no subcostal window. Image acquisition challenging due to patient body habitus. IMPRESSIONS  1. Left ventricular ejection fraction, by estimation, is 55 to 60%. The left ventricle has normal function. Left ventricular endocardial border not optimally defined to evaluate regional wall motion. Left ventricular diastolic parameters are indeterminate.  2. Right ventricular systolic function is normal. The right ventricular size is normal. Tricuspid regurgitation signal is inadequate for assessing PA pressure.  3. The mitral valve is normal in structure. No evidence of mitral valve regurgitation. No evidence of mitral stenosis.  4. The aortic valve is normal in structure. Aortic valve regurgitation is not visualized. Aortic valve sclerosis/calcification is present, without any evidence of aortic stenosis.  5. The inferior vena cava is dilated in size with <50% respiratory variability, suggesting right atrial pressure of 15 mmHg.  6. Very suboptimal images. FINDINGS  Left Ventricle: Left ventricular ejection fraction, by estimation, is 55 to 60%. The left ventricle has normal function. Left ventricular endocardial border not optimally defined to evaluate regional wall motion. The left ventricular internal cavity size was normal in size. There is no left ventricular hypertrophy. Left ventricular diastolic parameters are indeterminate. Right Ventricle: The right ventricular  size is normal. No increase in right ventricular wall thickness. Right ventricular systolic function is normal. Tricuspid regurgitation signal is inadequate for assessing PA pressure. Left Atrium: Left atrial size was normal in size. Right Atrium: Right atrial size was normal in size. Pericardium: There is no evidence of pericardial effusion. Mitral Valve: The mitral valve is normal in structure. No evidence of mitral valve regurgitation. No evidence of mitral valve stenosis. MV peak gradient, 7.3 mmHg. The mean mitral valve gradient is 3.0 mmHg. Tricuspid Valve: The tricuspid valve is normal in structure. Tricuspid valve regurgitation is not demonstrated. No evidence of tricuspid stenosis. Aortic Valve: The aortic valve is normal in structure. Aortic valve regurgitation is not visualized. Aortic valve sclerosis/calcification is present, without any evidence of aortic stenosis. Aortic valve mean gradient measures 4.0 mmHg. Aortic valve peak  gradient measures 7.8 mmHg. Aortic valve area, by VTI measures 1.39 cm. Pulmonic Valve: The pulmonic valve was normal in structure. Pulmonic valve regurgitation is not visualized. No evidence of pulmonic stenosis. Aorta: The aortic root is normal in size and structure. Venous: The inferior vena cava is dilated in size with less than 50% respiratory variability, suggesting right atrial pressure of 15 mmHg. IAS/Shunts: No atrial level shunt detected by color flow Doppler.  LEFT VENTRICLE PLAX 2D LVIDd:         4.46 cm   Diastology LVIDs:         3.09 cm   LV e' medial:    13.60 cm/s LV PW:  0.98 cm   LV E/e' medial:  8.8 LV IVS:        0.80 cm   LV e' lateral:   11.30 cm/s LVOT diam:     1.80 cm   LV E/e' lateral: 10.6 LV SV:         29 LV SV Index:   14 LVOT Area:     2.54 cm  LEFT ATRIUM           Index LA diam:      3.70 cm 1.74 cm/m LA Vol (A4C): 46.1 ml 21.72 ml/m  AORTIC VALVE                    PULMONIC VALVE AV Area (Vmax):    1.21 cm     PV Vmax:       0.99  m/s AV Area (Vmean):   1.16 cm     PV Vmean:      71.700 cm/s AV Area (VTI):     1.39 cm     PV VTI:        0.184 m AV Vmax:           140.00 cm/s  PV Peak grad:  3.9 mmHg AV Vmean:          93.700 cm/s  PV Mean grad:  2.0 mmHg AV VTI:            0.207 m AV Peak Grad:      7.8 mmHg AV Mean Grad:      4.0 mmHg LVOT Vmax:         66.60 cm/s LVOT Vmean:        42.700 cm/s LVOT VTI:          0.113 m LVOT/AV VTI ratio: 0.55  AORTA Ao Root diam: 3.10 cm MITRAL VALVE MV Area (PHT): 6.71 cm     SHUNTS MV Area VTI:   1.53 cm     Systemic VTI:  0.11 m MV Peak grad:  7.3 mmHg     Systemic Diam: 1.80 cm MV Mean grad:  3.0 mmHg MV Vmax:       1.35 m/s MV Vmean:      72.2 cm/s MV Decel Time: 113 msec MV E velocity: 119.23 cm/s Kathlyn Sacramento MD Electronically signed by Kathlyn Sacramento MD Signature Date/Time: 03/16/2021/9:24:34 AM    Final         Scheduled Meds:  atorvastatin  40 mg Oral Daily   enoxaparin (LOVENOX) injection  0.5 mg/kg Subcutaneous Q24H   insulin aspart  0-20 Units Subcutaneous TID AC & HS   insulin glargine-yfgn  5 Units Subcutaneous BID   metoCLOPramide (REGLAN) injection  10 mg Intravenous Q8H   multivitamin with minerals   Oral Daily   pantoprazole (PROTONIX) IV  40 mg Intravenous Q12H   Continuous Infusions:   ceFAZolin (ANCEF) IV 2 g (03/16/21 0458)   lactated ringers 100 mL/hr at 03/16/21 0946   promethazine (PHENERGAN) injection (IM or IVPB) Stopped (03/14/21 1515)     LOS: 2 days    Time spent: 35 minutes    Sidney Ace, MD [facility]   If 7PM-7AM, please contact night-coverage  03/16/2021, 10:29 AM

## 2021-03-16 NOTE — Consult Note (Signed)
NAME: Rebecca Park  DOB: 11/27/1960  MRN: [id number]  Date/Time: 03/16/2021 1:41 PM  REQUESTING PROVIDER: Dr. Drue Flirt Subjective:  REASON FOR CONSULT: MSSA bacteremia ? Rebecca Park is a 60 y.o. female with a history of Diabetes mellitus, hypertension, right foot infection with amputation of a few toes, left Charcot foot presented to the ED on 03/13/2021 with nausea vomiting, elevated blood glucose.  She initially thought she had the flu.  She had a chronic wound in the left foot and had a dressing for about 10 days.  In the ED physician remove the dressing the wound looked infected with purulent drainage and surrounding erythema.  She did not have any fever or chills. In the ED her heart rate was 103, BP 102/67, temperature 98.1 and sats 96%. WBC 18.1, Hb 10.5, platelet 177 and creatinine 2.15. Blood glucose was 390.  Blood culture sent and she was started on IV vancomycin and cefepime.  She was seen by Dr. 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  Was not salvageable as the foot vascular was consulted for BKA.  Patient was initially reluctant for it.  Blood cultures came back positive for staph aureus.  And antibiotics were changed to cefazolin.  Patient then agreed to undergo below-knee amputation and had it on 03/15/2021. Am seeing the patient for the staff aureus bacteremia. Patient complains of extreme nausea. Since her surgery she has been very nauseated No fever No cough or shortness of breath Feels very tired Past Medical History:  Diagnosis Date   Diabetes mellitus without complication ([place])    Hypertension    PONV (postoperative nausea and vomiting)    the day after rt 5th toe amputation surgery    Past Surgical History:  Procedure Laterality Date   AMPUTATION Right 11/10/2018   Procedure: AMPUTATION RAY (amputation 5th metatarsal Right foot;  Surgeon: Caroline More, DPM;   Location: [facility];  Service: Podiatry;  Laterality: Right;   AMPUTATION Left 03/15/2021   Procedure: AMPUTATION BELOW KNEE;  Surgeon: Zara Chess, MD;  Location: [facility];  Service: Vascular;  Laterality: Left;   AMPUTATION TOE Right 12/05/2018   Procedure: RAY RIGHT 4TH AMPUTATION;  Surgeon: Caroline More, DPM;  Location: [place];  Service: Podiatry;  Laterality: Right;  mac with local DIABETIC-oral meds   BONE BIOPSY Right 11/10/2018   Procedure: BONE BIOPSY- Right 4th toe;  Surgeon: Caroline More, DPM;  Location: [facility];  Service: Podiatry;  Laterality: Right;   BUNIONECTOMY Bilateral    COLONOSCOPY WITH PROPOFOL N/A 09/07/2018   Procedure: COLONOSCOPY WITH PROPOFOL;  Surgeon: Lin Landsman, MD;  Location: [facility] END[facility]COPY;  Service: Gastroenterology;  Laterality: N/A;   COLONOSCOPY WITH PROPOFOL N/A 09/17/2019   Procedure: COLONOSCOPY WITH PROPOFOL;  Surgeon: Lin Landsman, MD;  Location: [facility] ENDOSCOPY;  Service: Gastroenterology;  Laterality: N/A;    Social History   Socioeconomic History   Marital status: Married    Spouse name: Not on file   Number of children: Not on file   Years of education: Not on file   Highest education level: Not on file  Occupational History   Not on file  Tobacco Use   Smoking status: Never   Smokeless tobacco: Never  Vaping Use   Vaping Use: Never used  Substance and Sexual Activity   Alcohol use: Not Currently    Comment: recently quit 01/2018, 3-4 beers QD   Drug use: Never  Sexual activity: Yes  Other Topics Concern   Not on file  Social History Narrative   Not on file   Social Determinants of Health   Financial Resource Strain: Not on file  Food Insecurity: Not on file  Transportation Needs: Not on file  Physical Activity: Not on file  Stress: Not on file  Social Connections: Not on file  Intimate Partner Violence: Not on file    Family History  Problem Relation Age of Onset   Heart disease Father     Heart attack Father    Heart disease Maternal Grandfather    Breast cancer Neg Hx    No Known Allergies I? Current Facility-Administered Medications  Medication Dose Route Frequency Provider Last Rate Last Admin   acetaminophen (TYLENOL) tablet 650 mg  650 mg Oral Q6H PRN Zara Chess, MD       Or   acetaminophen (TYLENOL) suppository 650 mg  650 mg Rectal Q6H PRN Zara Chess, MD       alum & mag hydroxide-simeth (MAALOX/MYLANTA) 200-200-20 MG/5ML suspension 30 mL  30 mL Oral Q4H PRN Sreenath, Sudheer B, MD       atorvastatin (LIPITOR) tablet 40 mg  40 mg Oral Daily Zara Chess, MD   40 mg at 03/16/21 0819   ceFAZolin (ANCEF) IVPB 2g/100 mL premix  2 g Intravenous Q8H Pharr, [place], MD 200 mL/hr at 03/16/21 1253 2 g at 03/16/21 1253   enoxaparin (LOVENOX) injection 52.5 mg  0.5 mg/kg Subcutaneous Q24H Zara Chess, MD   52.5 mg at 03/16/21 0818   insulin aspart (novoLOG) injection 0-20 Units  0-20 Units Subcutaneous TID AC & HS Zara Chess, MD   4 Units at 03/16/21 1251   insulin glargine-yfgn (SEMGLEE) injection 8 Units  8 Units Subcutaneous BID Ralene Muskrat B, MD       lactated ringers infusion   Intravenous Continuous Ralene Muskrat B, MD 100 mL/hr at 03/16/21 0946 Rate Change at 03/16/21 0946   magnesium hydroxide (MILK OF MAGNESIA) suspension 30 mL  30 mL Oral Daily PRN Zara Chess, MD       metoCLOPramide (REGLAN) injection 10 mg  10 mg Intravenous Marius Ditch, MD   10 mg at 03/16/21 0829   morphine 2 MG/ML injection 2 mg  2 mg Intravenous Q3H PRN Ralene Muskrat B, MD       multivitamin with minerals tablet   Oral Daily Zara Chess, MD   1 tablet at 03/16/21 0820   ondansetron (ZOFRAN) injection 4 mg  4 mg Intravenous Q6H PRN Ralene Muskrat B, MD   4 mg at 03/16/21 1026   oxyCODONE-acetaminophen (PERCOCET/ROXICET) 5-325 MG per tablet 1-2 tablet  1-2 tablet Oral Q4H PRN Zara Chess, MD   2 tablet at 03/15/21 1736   pantoprazole (PROTONIX)  injection 40 mg  40 mg Intravenous Q12H Ralene Muskrat B, MD   40 mg at 03/16/21 0948   promethazine (PHENERGAN) 12.5 mg in sodium chloride 0.9 % 50 mL IVPB  12.5 mg Intravenous Q6H PRN Zara Chess, MD   Stopped at 03/14/21 1515   traZODone (DESYREL) tablet 25 mg  25 mg Oral QHS PRN Zara Chess, MD         Abtx:  Anti-infectives (From admission, onward)    Start     Dose/Rate Route Frequency Ordered Stop   03/14/21 2200  ceFAZolin (ANCEF) IVPB 2g/100 mL premix        2 g 200 mL/hr over 30 Minutes Intravenous Every 8 hours 03/14/21  1325     03/14/21 1100  ceFEPIme (MAXIPIME) 2 g in sodium chloride 0.9 % 100 mL IVPB  Status:  Discontinued        2 g 200 mL/hr over 30 Minutes Intravenous Every 12 hours 03/14/21 0238 03/14/21 1323   03/14/21 0240  vancomycin variable dose per unstable renal function (pharmacist dosing)  Status:  Discontinued         Does not apply See admin instructions 03/14/21 0241 03/14/21 1324   03/14/21 0200  metroNIDAZOLE (FLAGYL) IVPB 500 mg  Status:  Discontinued        500 mg 100 mL/hr over 60 Minutes Intravenous Every 12 hours 03/14/21 0126 03/14/21 1324   03/14/21 0130  ceFEPIme (MAXIPIME) 2 g in sodium chloride 0.9 % 100 mL IVPB  Status:  Discontinued        2 g 200 mL/hr over 30 Minutes Intravenous  Once 03/14/21 0126 03/14/21 0129   03/14/21 0130  vancomycin (VANCOCIN) IVPB 1000 mg/200 mL premix  Status:  Discontinued        1,000 mg 200 mL/hr over 60 Minutes Intravenous  Once 03/14/21 0126 03/14/21 0131   03/14/21 0045  ceFEPIme (MAXIPIME) 2 g in sodium chloride 0.9 % 100 mL IVPB        2 g 200 mL/hr over 30 Minutes Intravenous  Once 03/14/21 0032 03/14/21 0117   03/14/21 0045  vancomycin (VANCOCIN) IVPB 1000 mg/200 mL premix  Status:  Discontinued        1,000 mg 200 mL/hr over 60 Minutes Intravenous  Once 03/14/21 0032 03/14/21 0033   03/14/21 0045  vancomycin (VANCOREADY) IVPB 2000 mg/400 mL        2,000 mg 200 mL/hr over 120 Minutes  Intravenous  Once 03/14/21 0033 03/14/21 0251       REVIEW OF SYSTEMS:  Const: negative fever, negative chills, negative weight loss Eyes: negative diplopia or visual changes, negative eye pain ENT: negative coryza, negative sore throat Resp: negative cough, hemoptysis, dyspnea Cards: negative for chest pain, palpitations, lower extremity edema GU: negative for frequency, dysuria and hematuria GI: Severe nausea with dry heaves Skin: negative for rash and pruritus Heme: negative for easy bruising and gum/nose bleeding MS: Generalized weakness Neurolo:negative for headaches, dizziness, vertigo, memory problems  Psych: negative for feelings of anxiety, depression  Endocrine: Poorly controlled diabetes Allergy/Immunology- negative for any medication or food allergies  Objective:  VITALS:  BP (!) 104/57 (BP Location: Left Arm)    Pulse (!) 104    Temp 98.1 F (36.7 C) (Oral)    Resp 18    Ht _0  (1.676 m)    Wt 104.3 kg    SpO2 95%    BMI 37.12 kg/m  PHYSICAL EXAM:  General: Awake, cooperative, some distress, fatigued head: Normocephalic, without obvious abnormality, atraumatic. Eyes: Conjunctivae clear, anicteric sclerae. Pupils are equal ENT Nares normal. No drainage or sinus tenderness. Lips, mucosa, and tongue normal. No Thrush Neck: Supple, symmetrical, no adenopathy, thyroid: non tender no carotid bruit and no JVD. Back: No CVA tenderness. Lungs: Clear to auscultation bilaterally. No Wheezing or Rhonchi. No rales. Heart: Regular rate and rhythm, no murmur, rub or gallop. Abdomen: Soft, non-tender,not distended. Bowel sounds normal. No masses Extremities:  On presentation she had a malleolar wound with purulent discharge.    Now she has a BKA with surgical dressing  right foot couple of toes have been amputated in the past. no wounds  Skin: No rashes or lesions. Or bruising Lymph:  Cervical, supraclavicular normal. Neurologic: Grossly non-focal Pertinent Labs Lab  Results CBC    Component Value Date/Time   WBC 12.2 (H) 03/15/2021 0444   RBC 3.45 (L) 03/15/2021 0444   HGB 9.5 (L) 03/15/2021 0444   HCT 28.9 (L) 03/15/2021 0444   PLT 175 03/15/2021 0444   MCV 83.8 03/15/2021 0444   MCH 27.5 03/15/2021 0444   MCHC 32.9 03/15/2021 0444   RDW 14.5 03/15/2021 0444   LYMPHSABS 0.5 (L) 03/15/2021 0444   MONOABS 0.6 03/15/2021 0444   EOSABS 0.0 03/15/2021 0444   BASOSABS 0.0 03/15/2021 0444    CMP Latest Ref Rng & Units 03/16/2021 03/15/2021 03/14/2021  Glucose 70 - 99 mg/dL 141(H) 275(H) 392(H)  BUN 6 - 20 mg/dL 29(H) 30(H) 41(H)  Creatinine 0.44 - 1.00 mg/dL 1.34(H) 1.23(H) 1.85(H)  Sodium 135 - 145 mmol/L 133(L) 130(L) 128(L)  Potassium 3.5 - 5.1 mmol/L 3.7 3.7 4.2  Chloride 98 - 111 mmol/L 105 98 94(L)  CO2 22 - 32 mmol/L _0 Calcium 8.9 - 10.3 mg/dL 8.0(L) 8.8(L) 8.0(L)  Total Protein 6.5 - 8.1 g/dL - 5.9(L) -  Total Bilirubin 0.3 - 1.2 mg/dL - 1.6(H) -  Alkaline Phos 38 - 126 U/L - 148(H) -  AST 15 - 41 U/L - 21 -  ALT 0 - 44 U/L - 18 -      Microbiology: Recent Results (from the past 240 hour(s))  Culture, blood (Routine x 2)     Status: Abnormal (Preliminary result)   Collection Time: 03/13/21 11:49 PM   Specimen: BLOOD  Result Value Ref Range Status   Specimen Description   Final    BLOOD LEFT ASSIST CONTROL Performed at [facility], [address], [place], [place] [address]    Special Requests   Final    BOTTLES DRAWN AEROBIC AND ANAEROBIC Blood Culture adequate volume Performed at [facility], [address] [place], [place] [address]    Culture  Setup Time   Final    GRAM POSITIVE COCCI IN BOTH AEROBIC AND ANAEROBIC BOTTLES CRITICAL RESULT CALLED TO, READ BACK BY AND VERIFIED WITH: ALEX CHAPPELL AT 0092 03/14/21.PMF    Culture (A)  Final    STAPHYLOCOCCUS AUREUS REPEATING SENSITIVITY Performed at [facility], [place] [address] [place], [place] [address]    Report Status PENDING   Incomplete  Culture, blood (Routine x 2)     Status: Abnormal (Preliminary result)   Collection Time: 03/13/21 11:49 PM   Specimen: BLOOD  Result Value Ref Range Status   Specimen Description   Final    BLOOD RIGHT FOREARM Performed at [facility], [address] [place], [place] [address]    Special Requests   Final    BOTTLES DRAWN AEROBIC AND ANAEROBIC Blood Culture adequate volume Performed at [facility], [address] [place], [place] [address]    Culture  Setup Time   Final    GRAM POSITIVE COCCI IN BOTH AEROBIC AND ANAEROBIC BOTTLES CRITICAL RESULT CALLED TO, READ BACK BY AND VERIFIED WITH: ALEX CHAPPELL AT 1314 03/14/21.PMF Performed at [facility], [address] [place], [place] [address]    Culture STAPHYLOCOCCUS AUREUS (A)  Final   Report Status PENDING  Incomplete  Resp Panel by RT-PCR (Flu A&B, Covid) Nasopharyngeal Swab     Status: None   Collection Time: 03/13/21 11:49 PM   Specimen: Nasopharyngeal Swab; Nasopharyngeal(NP) swabs in vial transport medium  Result Value Ref Range  Status   SARS Coronavirus 2 by RT PCR NEGATIVE NEGATIVE Final    Comment: (NOTE) SARS-CoV-2 target nucleic acids are NOT DETECTED.  The SARS-CoV-2 RNA is generally detectable in upper respiratory specimens during the acute phase of infection. The lowest concentration of SARS-CoV-2 viral copies this assay can detect is 138 copies/mL. A negative result does not preclude SARS-Cov-2 infection and should not be used as the sole basis for treatment or other patient management decisions. A negative result may occur with  improper specimen collection/handling, submission of specimen other than nasopharyngeal swab, presence of viral mutation(s) within the areas targeted by this assay, and inadequate number of viral copies(<138 copies/mL). A negative result must be combined with clinical observations, patient history, and epidemiological information. The  expected result is Negative.  Fact Sheet for Patients:  EntrepreneurPulse.com.au  Fact Sheet for Healthcare Providers:  IncredibleEmployment.be  This test is no t yet approved or cleared by the [place] FDA and  has been authorized for detection and/or diagnosis of SARS-CoV-2 by FDA under an Emergency Use Authorization (EUA). This EUA will remain  in effect (meaning this test can be used) for the duration of the COVID-19 declaration under Section 564(b)(1) of the Act, 21 U.S.C.section 360bbb-3(b)(1), unless the authorization is terminated  or revoked sooner.       Influenza A by PCR NEGATIVE NEGATIVE Final   Influenza B by PCR NEGATIVE NEGATIVE Final    Comment: (NOTE) The Xpert Xpress SARS-CoV-2/FLU/RSV plus assay is intended as an aid in the diagnosis of influenza from Nasopharyngeal swab specimens and should not be used as a sole basis for treatment. Nasal washings and aspirates are unacceptable for Xpert Xpress SARS-CoV-2/FLU/RSV testing.  Fact Sheet for Patients: EntrepreneurPulse.com.au  Fact Sheet for Healthcare Providers: IncredibleEmployment.be  This test is not yet approved or cleared by the [place] FDA and has been authorized for detection and/or diagnosis of SARS-CoV-2 by FDA under an Emergency Use Authorization (EUA). This EUA will remain in effect (meaning this test can be used) for the duration of the COVID-19 declaration under Section 564(b)(1) of the Act, 21 U.S.C. section 360bbb-3(b)(1), unless the authorization is terminated or revoked.  Performed at [facility], [address] [place], [place] [address]   Blood Culture ID Panel (Reflexed)     Status: Abnormal   Collection Time: 03/13/21 11:49 PM  Result Value Ref Range Status   Enterococcus faecalis NOT DETECTED NOT DETECTED Final   Enterococcus Faecium NOT DETECTED NOT DETECTED Final   Listeria  monocytogenes NOT DETECTED NOT DETECTED Final   Staphylococcus species DETECTED (A) NOT DETECTED Final    Comment: CRITICAL RESULT CALLED TO, READ BACK BY AND VERIFIED WITH: ALEX CHAPPELL AT 1314 03/14/21.PMF    Staphylococcus aureus (BCID) DETECTED (A) NOT DETECTED Final    Comment: CRITICAL RESULT CALLED TO, READ BACK BY AND VERIFIED WITH: ALEX CHAPPELL AT 1314 03/14/21.PMF    Staphylococcus epidermidis NOT DETECTED NOT DETECTED Final   Staphylococcus lugdunensis NOT DETECTED NOT DETECTED Final   Streptococcus species NOT DETECTED NOT DETECTED Final   Streptococcus agalactiae NOT DETECTED NOT DETECTED Final   Streptococcus pneumoniae NOT DETECTED NOT DETECTED Final   Streptococcus pyogenes NOT DETECTED NOT DETECTED Final   A.calcoaceticus-baumannii NOT DETECTED NOT DETECTED Final   Bacteroides fragilis NOT DETECTED NOT DETECTED Final   Enterobacterales NOT DETECTED NOT DETECTED Final   Enterobacter cloacae complex NOT DETECTED NOT DETECTED Final   Escherichia coli NOT DETECTED NOT DETECTED Final   Klebsiella aerogenes NOT  DETECTED NOT DETECTED Final   Klebsiella oxytoca NOT DETECTED NOT DETECTED Final   Klebsiella pneumoniae NOT DETECTED NOT DETECTED Final   Proteus species NOT DETECTED NOT DETECTED Final   Salmonella species NOT DETECTED NOT DETECTED Final   Serratia marcescens NOT DETECTED NOT DETECTED Final   Haemophilus influenzae NOT DETECTED NOT DETECTED Final   Neisseria meningitidis NOT DETECTED NOT DETECTED Final   Pseudomonas aeruginosa NOT DETECTED NOT DETECTED Final   Stenotrophomonas maltophilia NOT DETECTED NOT DETECTED Final   Candida albicans NOT DETECTED NOT DETECTED Final   Candida auris NOT DETECTED NOT DETECTED Final   Candida glabrata NOT DETECTED NOT DETECTED Final   Candida krusei NOT DETECTED NOT DETECTED Final   Candida parapsilosis NOT DETECTED NOT DETECTED Final   Candida tropicalis NOT DETECTED NOT DETECTED Final   Cryptococcus neoformans/gattii NOT  DETECTED NOT DETECTED Final   Meth resistant mecA/C and MREJ NOT DETECTED NOT DETECTED Final    Comment: Performed at [facility], [address] [place], [place] [address]  Aerobic/Anaerobic Culture w Gram Stain (surgical/deep wound)     Status: None (Preliminary result)   Collection Time: 03/13/21 11:50 PM   Specimen: Ankle; Wound  Result Value Ref Range Status   Specimen Description   Final    ANKLE Performed at [facility], [address], [place], [place] [address]    Special Requests   Final    NONE Performed at [facility], [address] [place], [place] [address]    Gram Stain   Final    NO SQUAMOUS EPITHELIAL CELLS SEEN FEW WBC SEEN MODERATE GRAM POSITIVE COCCI    Culture   Final    MODERATE STAPHYLOCOCCUS AUREUS CULTURE REINCUBATED FOR BETTER GROWTH Performed at [facility], [place] [address] [place], [place] [address]    Report Status PENDING  Incomplete   Organism ID, Bacteria STAPHYLOCOCCUS AUREUS  Final      Susceptibility   Staphylococcus aureus - MIC*    CIPROFLOXACIN >=8 RESISTANT Resistant     ERYTHROMYCIN >=8 RESISTANT Resistant     GENTAMICIN <=0.5 SENSITIVE Sensitive     OXACILLIN 0.5 SENSITIVE Sensitive     TETRACYCLINE <=1 SENSITIVE Sensitive     VANCOMYCIN 1 SENSITIVE Sensitive     TRIMETH/SULFA <=10 SENSITIVE Sensitive     CLINDAMYCIN RESISTANT Resistant     RIFAMPIN <=0.5 SENSITIVE Sensitive     Inducible Clindamycin POSITIVE Resistant     * MODERATE STAPHYLOCOCCUS AUREUS  Urine Culture     Status: None   Collection Time: 03/14/21 10:27 AM   Specimen: Urine, Random  Result Value Ref Range Status   Specimen Description   Final    URINE, RANDOM Performed at [facility], [address], [place], [place] [address]    Special Requests   Final    NONE Performed at [facility], [address], [place], [place] [address]    Culture   Final    NO GROWTH Performed at [facility], [place] [address], [place], [place] [address]    Report Status 03/15/2021 FINAL  Final  CULTURE, BLOOD (ROUTINE X 2) w Reflex to ID Panel     Status: None (Preliminary result)   Collection Time: 03/15/21  2:05 AM   Specimen: BLOOD  Result Value Ref Range Status   Specimen Description BLOOD BLOOD LEFT FOREARM  Final   Special Requests   Final    BOTTLES DRAWN AEROBIC AND ANAEROBIC Blood  Culture adequate volume   Culture   Final    NO GROWTH 1 DAY Performed at [facility], [address] [place], [place] [address]    Report Status PENDING  Incomplete  CULTURE, BLOOD (ROUTINE X 2) w Reflex to ID Panel     Status: None (Preliminary result)   Collection Time: 03/15/21  4:44 AM   Specimen: BLOOD  Result Value Ref Range Status   Specimen Description BLOOD BLOOD LEFT FOREARM  Final   Special Requests   Final    BOTTLES DRAWN AEROBIC AND ANAEROBIC Blood Culture adequate volume   Culture   Final    NO GROWTH 1 DAY Performed at [facility], [address] [place], [place] [address]    Report Status PENDING  Incomplete    IMAGING RESULTS: MRI of the left foot shows soft tissue ulcer along the medial midfoot with underlying probable osteomyelitis in the medial talar head.  Charcot's arthropathy with subtalar malalignment and associated periarticular marrow edema.  Subacute nondisplaced vertically oriented talar dome fracture. Large subtalar joint effusion likely related to chronic joint malalignment/neuropathic arthropathy and the talar fracture.  Tenosynovitis of the posterior tibial tendon above and below the medial malleolus. I have personally reviewed the films ? Impression/Recommendation ? Diabetic foot infection of the left with sepsis  Charcot arthropathy. Medial malleolus ulcer with underlying osteomyelitis and septic arthritis. Status post BKA  MSSA bacteremia secondary to the above. Antibiotic has been de-escalated from Vanco and cefepime to  cefazolin. Will repeat blood cultures 2D echo.  Not a good study Will need TEE  Diabetes mellitus poorly controlled.  Now on insulin  AKI.  Improving  Anemia. ___________________________________________________ Discussed with patient, requesting provider Note:  This document was prepared using Dragon voice recognition software and may include unintentional dictation errors.

## 2021-03-16 NOTE — Evaluation (Signed)
Physical Therapy Evaluation Patient Details Name: Rebecca Park MRN: [id number] DOB: Jan 09, 1961 Today's Date: 03/16/2021  History of Present Illness  Rebecca Park is a 42yoF who comes to [facility] on 03/13/21 c N/V, hyperglycemia. Pt missed her OP FU with podiatry due to feeling ill for days. 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 Julianne Rice.  Clinical Impression  Pt admitted with above diagnosis. Pt currently with functional limitations due to the deficits listed below (see "PT Problem List"). Upon entry, pt in bed, awake and agreeable to limited session, but not willing to attempt any mobility to EOB due to on-going nausea, still waiting on meds. The pt is alert, pleasant, interactive, and able to confirm home info and PLOF. Pt is tachycardic throughout session, BP 657Q systolic; pt received on room air at 84-85% SpO2, recovers to 98% on 2L, then titrated to 1L at exit. Gave education on limb management in early acute phases post amputation, as well as NWB precautions, likely inappropriateness of kneeling scooter use, and need for use of WC at DC to make living environment accessible. Pt is open to STR stay if needed, if she is found to require significant physical assist for basic ADL mobility, however she has been practicing modified NWB techniques for months now in the setting of ongoing wound. Anticipate ability to be made modI with bed mobility and transfers prior to DC. Patient's performance this date reveals decreased ability, independence, and tolerance in performing all basic mobility required for performance of activities of daily living. Pt requires additional DME, close physical assistance, and cues for safe participate in mobility. Pt will benefit from skilled PT intervention to  increase independence and safety with basic mobility in preparation for discharge to the venue listed below.          Recommendations for follow up therapy are one component of a multi-disciplinary discharge planning process, led by the attending physician.  Recommendations may be updated based on patient status, additional functional criteria and insurance authorization.  Follow Up Recommendations Home health PT    Assistance Recommended at Discharge Set up Supervision/Assistance  Functional Status Assessment Patient has had a recent decline in their functional status and demonstrates the ability to make significant improvements in function in a reasonable and predictable amount of time.  Equipment Recommendations  Wheelchair (measurements PT);Wheelchair cushion (measurements PT) (elevated leg rest Left , anti tippers, WC cushion.)    Recommendations for Other Services       Precautions / Restrictions Precautions Precautions: Fall Restrictions Weight Bearing Restrictions: No (no formal orders; acute amputees are default NWB for mobility)      Mobility  Bed Mobility               General bed mobility comments: deferred due to uncontrolled nausea at visit, meds in transit    Transfers                        Ambulation/Gait                  Stairs            Wheelchair Mobility    Modified Rankin (Stroke Patients Only)       Balance  Pertinent Vitals/Pain Pain Assessment:  (No pain at rest, but sore with mobility to a 4/10)    Home Living Family/patient expects to be discharged to:: Private residence Living Arrangements: Spouse/significant other Available Help at Discharge: Family Type of Home: House Home Access: Stairs to enter;Ramped entrance (ramp being built today)   Technical brewer of Steps: 2   Home Layout: One level Home Equipment: Conservation officer, nature (2  wheels);Kasandra Knudsen - single point Additional Comments: Kneeing scooter for mobility since september;    Prior Function Prior Level of Function : Independent/Modified Independent (no falls history)                     Hand Dominance        Extremity/Trunk Assessment                Communication      Cognition Arousal/Alertness: Awake/alert (drowsy) Behavior During Therapy: WFL for tasks assessed/performed Overall Cognitive Status: Within Functional Limits for tasks assessed                                          General Comments      Exercises Amputee Exercises Straight Leg Raises: AROM;Left;Supine (able to SLR to for limb elevation in session.)   Assessment/Plan    PT Assessment Patient needs continued PT services  PT Problem List Decreased strength;Decreased activity tolerance;Decreased range of motion;Decreased balance;Decreased mobility;Decreased knowledge of precautions;Decreased knowledge of use of DME       PT Treatment Interventions DME instruction;Gait training;Stair training;Functional mobility training;Therapeutic activities;Therapeutic exercise;Balance training;Patient/family education;Wheelchair mobility training    PT Goals (Current goals can be found in the Care Plan section)  Acute Rehab PT Goals Patient Stated Goal: return to home, maximize healing potential to allow for future prosthesis use PT Goal Formulation: With patient Time For Goal Achievement: 03/30/21 Potential to Achieve Goals: Fair    Frequency 7X/week   Barriers to discharge        Co-evaluation               AM-PAC PT "6 Clicks" Mobility  Outcome Measure Help needed turning from your back to your side while in a flat bed without using bedrails?: A Lot Help needed moving from lying on your back to sitting on the side of a flat bed without using bedrails?: A Lot Help needed moving to and from a bed to a chair (including a wheelchair)?: A Lot Help  needed standing up from a chair using your arms (e.g., wheelchair or bedside chair)?: A Lot Help needed to walk in hospital room?: Total Help needed climbing 3-5 steps with a railing? : Total 6 Click Score: 10    End of Session Equipment Utilized During Treatment: Oxygen Activity Tolerance: Treatment limited secondary to medical complications (Comment);Other (comment) (nausea) Patient left: in bed;with family/visitor present;with call bell/phone within reach Nurse Communication: Mobility status (O2 needs, nausea limitations) PT Visit Diagnosis: Difficulty in walking, not elsewhere classified (R26.2);Other abnormalities of gait and mobility (R26.89)    Time: 1610-9604 PT Time Calculation (min) (ACUTE ONLY): 38 min   Charges:   PT Evaluation $PT Eval Moderate Complexity: 1 Mod PT Treatments $Self Care/Home Management: 8-22       3:54 PM, 03/16/21 Etta Grandchild, PT, DPT Physical Therapist - [facility]  [phone] ([place])    [place] C 03/16/2021, 3:50 PM

## 2021-03-16 NOTE — Progress Notes (Addendum)
Inpatient Diabetes Program Recommendations  AACE/ADA: New Consensus Statement on Inpatient Glycemic Control   Target Ranges:  Prepandial:   less than 140 mg/dL      Peak postprandial:   less than 180 mg/dL (1-2 hours)      Critically ill patients:  140 - 180 mg/dL     Latest Reference Range & Units 03/15/21 08:30 03/15/21 10:13 03/15/21 11:58 03/15/21 13:11 03/15/21 17:23 03/15/21 21:09  Glucose-Capillary 70 - 99 mg/dL 271 (H) 250 (H) 261 (H) 251 (H) 276 (H) 241 (H)    Latest Reference Range & Units 03/14/21 04:43  Hemoglobin A1C 4.8 - 5.6 % 10.5 (H)   Review of Glycemic Control  Diabetes history: DM2 Outpatient Diabetes medications: Glipzide 10 mg BID, Trulicity 1.5 mg Qweek Current orders for Inpatient glycemic control: Semglee 8 units BID, Novolog 0-20 units TID with meals and bedtime  Inpatient Diabetes Program Recommendations:    Insulin: Noted Semglee was increased from 5 units BID to 8 units BID.  HbgA1C: A1C 10.5% on 03/14/21 indicating an average glucose of 255 mg/dl over the past 2-3 months.  Addendum 03/16/21@13 :15-Spoke with patient at bedside regarding DM. Patient states that she is taking Glipizide 10 mg BID and Trulicity 1.5 mg Qweek for DM control as an outpatient. Patient is followed by PCP (just starting seeing new PCP 6 months ago. Patient states that she started on Trulicity 4.68 mg when she started seeing the new PCP and she has been on increased dose of 1.5 mg for several months. Patient reports that she does not check glucose very often. Inquired about any knowledge of FreeStyle Libre2 and patient states she has heard of it and has some family that use it. Patient states she would be interested in using FreeSyle Libre2 CGM for closer glucose monitoring.  Inquired about prior A1C and patient reports last A1C was in mid 7% range. Discussed A1C results (10.5% on 03/14/21) and explained that current A1C indicates an average glucose of 255 mg/dl over the past 2-3 months.  Discussed glucose and A1C goals. Discussed importance of checking CBGs and maintaining good CBG control to prevent long-term and short-term complications. Stressed to the patient the importance of improving glycemic control to prevent further complications from uncontrolled diabetes especially following surgery. Patient reports that she has never used insulin in the past but she would be willing to use insulin if prescribed at discharge. Patient also notes that her spouse uses an insulin pump so she is familiar with insulin (but has not given any insulin injections).  Was planning to review insulin pen but patient states she still feels nauseous and has gotten medications that are making her feel sleepy so she asked that I come back tomorrow to review insulin pen in case insulin is prescribed at discharge.  Patient verbalized understanding of information discussed and reports no further questions at this time related to diabetes.  Thanks, Barnie Alderman, RN, MSN, CDE Diabetes Coordinator Inpatient Diabetes Program [phone] (Team Pager from 8am to 5pm)

## 2021-03-16 NOTE — Progress Notes (Signed)
*  PRELIMINARY RESULTS* Echocardiogram 2D Echocardiogram has been performed.  Rebecca Park 03/16/2021, 9:10 AM

## 2021-03-16 NOTE — Plan of Care (Signed)
°  Problem: Education: Goal: Knowledge of General Education information will improve Description: Including pain rating scale, medication(s)/side effects and non-pharmacologic comfort measures Outcome: Progressing   Problem: Health Behavior/Discharge Planning: Goal: Ability to manage health-related needs will improve Outcome: Progressing   Problem: Clinical Measurements: Goal: Ability to maintain clinical measurements within normal limits will improve Outcome: Progressing Goal: Will remain free from infection Outcome: Progressing Goal: Diagnostic test results will improve Outcome: Progressing Goal: Respiratory complications will improve Outcome: Progressing Goal: Cardiovascular complication will be avoided Outcome: Progressing   Problem: Activity: Goal: Risk for activity intolerance will decrease Outcome: Progressing   Problem: Nutrition: Goal: Adequate nutrition will be maintained Outcome: Progressing   Problem: Coping: Goal: Level of anxiety will decrease Outcome: Progressing   Problem: Elimination: Goal: Will not experience complications related to bowel motility Outcome: Progressing Goal: Will not experience complications related to urinary retention Outcome: Progressing   Problem: Pain Managment: Goal: General experience of comfort will improve Outcome: Progressing   Problem: Safety: Goal: Ability to remain free from injury will improve Outcome: Progressing   Problem: Skin Integrity: Goal: Risk for impaired skin integrity will decrease Outcome: Progressing   Problem: Education: Goal: Ability to describe self-care measures that may prevent or decrease complications (Diabetes Survival Skills Education) will improve Outcome: Progressing Goal: Individualized Educational Video(s) Outcome: Progressing   Problem: Coping: Goal: Ability to adjust to condition or change in health will improve Outcome: Progressing   Problem: Fluid Volume: Goal: Ability to  maintain a balanced intake and output will improve Outcome: Progressing   Problem: Health Behavior/Discharge Planning: Goal: Ability to identify and utilize available resources and services will improve Outcome: Progressing Goal: Ability to manage health-related needs will improve Outcome: Progressing   Problem: Metabolic: Goal: Ability to maintain appropriate glucose levels will improve Outcome: Progressing   Problem: Nutritional: Goal: Maintenance of adequate nutrition will improve Outcome: Progressing Goal: Progress toward achieving an optimal weight will improve Outcome: Progressing   Problem: Skin Integrity: Goal: Risk for impaired skin integrity will decrease Outcome: Progressing   Problem: Tissue Perfusion: Goal: Adequacy of tissue perfusion will improve Outcome: Progressing   Problem: Education: Goal: Knowledge of the prescribed therapeutic regimen will improve Outcome: Progressing Goal: Ability to verbalize activity precautions or restrictions will improve Outcome: Progressing Goal: Understanding of discharge needs will improve Outcome: Progressing   Problem: Activity: Goal: Ability to perform//tolerate increased activity and mobilize with assistive devices will improve Outcome: Progressing   Problem: Clinical Measurements: Goal: Postoperative complications will be avoided or minimized Outcome: Progressing   Problem: Self-Care: Goal: Ability to meet self-care needs will improve Outcome: Progressing   Problem: Self-Concept: Goal: Ability to maintain and perform role responsibilities to the fullest extent possible will improve Outcome: Progressing   Problem: Pain Management: Goal: Pain level will decrease with appropriate interventions Outcome: Progressing   Problem: Skin Integrity: Goal: Demonstration of wound healing without infection will improve Outcome: Progressing   

## 2021-03-16 NOTE — Progress Notes (Signed)
OT Cancellation Note  Patient Details Name: Rebecca Park MRN: [id number] DOB: 12-17-1960   Cancelled Treatment:    Reason Eval/Treat Not Completed: Medical issues which prohibited therapy. Consult received, chart reviewed. Spoke with PT. Met with pt and spouse. Pt continues to be very nauseated, pending medications to address. Will hold OT evaluation this date and agreeable to re-attempt next date.   Ardeth Perfect., MPH, MS, OTR/L ascom [phone] 03/16/21, 4:16 PM

## 2021-03-17 DIAGNOSIS — M86171 Other acute osteomyelitis, right ankle and foot: Secondary | ICD-10-CM | POA: Diagnosis not present

## 2021-03-17 LAB — CULTURE, BLOOD (ROUTINE X 2)
Special Requests: ADEQUATE
Special Requests: ADEQUATE

## 2021-03-17 LAB — SURGICAL PATHOLOGY

## 2021-03-17 LAB — CBC
HCT: 29.8 % — ABNORMAL LOW (ref 36.0–46.0)
Hemoglobin: 9.5 g/dL — ABNORMAL LOW (ref 12.0–15.0)
MCH: 27.4 pg (ref 26.0–34.0)
MCHC: 31.9 g/dL (ref 30.0–36.0)
MCV: 85.9 fL (ref 80.0–100.0)
Platelets: 179 10*3/uL (ref 150–400)
RBC: 3.47 MIL/uL — ABNORMAL LOW (ref 3.87–5.11)
RDW: 15.1 % (ref 11.5–15.5)
WBC: 16.2 10*3/uL — ABNORMAL HIGH (ref 4.0–10.5)
nRBC: 0 % (ref 0.0–0.2)

## 2021-03-17 LAB — BASIC METABOLIC PANEL
Anion gap: 6 (ref 5–15)
BUN: 22 mg/dL — ABNORMAL HIGH (ref 6–20)
CO2: 27 mmol/L (ref 22–32)
Calcium: 8.2 mg/dL — ABNORMAL LOW (ref 8.9–10.3)
Chloride: 105 mmol/L (ref 98–111)
Creatinine, Ser: 0.96 mg/dL (ref 0.44–1.00)
GFR, Estimated: >60 mL/min (ref >60)
Glucose, Bld: 124 mg/dL — ABNORMAL HIGH (ref 70–99)
Potassium: 3.6 mmol/L (ref 3.5–5.1)
Sodium: 138 mmol/L (ref 135–145)

## 2021-03-17 LAB — MAGNESIUM: Magnesium: 2.2 mg/dL (ref 1.7–2.4)

## 2021-03-17 LAB — GLUCOSE, CAPILLARY
Glucose-Capillary: 127 mg/dL — ABNORMAL HIGH (ref 70–99)
Glucose-Capillary: 170 mg/dL — ABNORMAL HIGH (ref 70–99)
Glucose-Capillary: 188 mg/dL — ABNORMAL HIGH (ref 70–99)
Glucose-Capillary: 180 mg/dL — ABNORMAL HIGH (ref 70–99)

## 2021-03-17 MED ORDER — INSULIN STARTER KIT- PEN NEEDLES (ENGLISH)
1.0000 | Freq: Once | Status: DC
  Filled 2021-03-17: qty 1

## 2021-03-17 MED ORDER — SODIUM CHLORIDE 0.9 % IV SOLN: INTRAVENOUS | Status: DC

## 2021-03-17 NOTE — Progress Notes (Signed)
PROGRESS NOTE    Rebecca Park  LNL:[id number] DOB: Jun 18, 1960 DOA: 03/13/2021 PCP: Marinda Elk, MD    Brief Narrative:  60 year old with history of type 2 diabetes on glipizide, hypertension, Charcot foot with about 4 months of medial malleoli infection and open ulceration under conservative management by Dr. Luana Shu presented with infected purulent drainage and surrounding erythema, nausea vomiting.   Severe sepsis present on admission, tachycardic, acute kidney injury, lactic acidosis, x-ray with possible osteomyelitis  Seen in consultation by podiatry and vascular surgery.  Podiatry felt that limb was not salvageable.  Signed off and involve vascular surgery.  After vascular surgery evaluation patient initially refused amputation however now agreeable.  Plan for amputation on 12/18.  Discussed with vascular surgery.  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  Tolerated procedure well.  Postprocedural pain well controlled.  Blood culture positive for staph aureus.  ID involved.  On IV Ancef.  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  Pain minimal Plan: Multimodal pain control IV antibiotics Antiemetics Monitor vitals and fever curve Vascular follow-up Therapy evaluations Anticipate home with home health services  Staff aureus bacteremia Source likely infected wound Patient hemodynamically stable with improving sepsis physiology TTE negative Surveillance cultures drawn, no growth to date Plan: Continue Ancef Follow cultures Monitor vitals and fever curve Cardiology engaged 12/24 TEE  Hyperlipidemia PTA statin  Insulin-dependent diabetes  mellitus Increase Semglee to 8 units daily Resistant sliding scale Carb modified diet once diet advanced  Essential hypertension PTA Norvasc   DVT prophylaxis: SQ Lovenox Code Status: Full code Family Communication: Spouse at bedside 12/18 Disposition Plan: Status is: Inpatient  Remains inpatient appropriate because: Left lower extremity osteomyelitis.  Status post left BKA, MSSA bacteremia      Level of care: Med-Surg  Consultants:  Vascular surgery  Procedures:  L LE amputation 12/18  Antimicrobials: Cefepime Vancomycin   Subjective: Seen and examined.  Nausea well controlled.  Pain well controlled  Objective: Vitals:   03/17/21 0145 03/17/21 0509 03/17/21 0839 03/17/21 1226  BP: 107/63 116/69 110/63 (!) 97/59  Pulse: 97 98 92 97  Resp: _0 Temp: 98.4 F (36.9 C) 98.4 F (36.9 C) 97.9 F (36.6 C) 98.6 F (37 C)  TempSrc: Oral   Oral  SpO2: 97% 97% 99% 95%  Weight:      Height:        Intake/Output Summary (Last 24 hours) at 03/17/2021 1324 Last data filed at 03/17/2021 0303 Gross per 24 hour  Intake 1413.58 ml  Output 1300 ml  Net 113.58 ml   Filed Weights   03/13/21 2339  Weight: 104.3 kg    Examination:  General exam: No apparent distress Respiratory system: Clear lungs.  Normal work of breathing.  Room air Cardiovascular system: S1-S2, RRR, no murmurs, no pedal edema Gastrointestinal system: Soft, NT/ND, normal bowel sounds Central nervous system: Alert and oriented. No focal neurological deficits. Extremities: Status post left BKA Skin: Left BKA Psychiatry: Judgement and insight appear normal. Mood & affect appropriate.     Data Reviewed: I have personally reviewed following labs and imaging studies  CBC: Recent Labs  Lab 03/13/21 2349 03/14/21 0443 03/15/21 0444 03/17/21 0429  WBC 18.1* 17.7*  12.2* 16.2*  NEUTROABS 16.8*  --  10.9*  --   HGB 10.5* 9.5* 9.5* 9.5*  HCT 32.1* 28.9* 28.9* 29.8*  MCV 84.0 84.3 83.8  85.9  PLT 177 154 175 465   Basic Metabolic Panel: Recent Labs  Lab 03/13/21 2349 03/14/21 0443 03/15/21 0444 03/16/21 0452 03/17/21 0429  NA 128* 128* 130* 133* 138  K 3.8 4.2 3.7 3.7 3.6  CL 92* 94* 98 105 105  CO2 _0 GLUCOSE 390* 392* 275* 141* 124*  BUN 44* 41* 30* 29* 22*  CREATININE 2.15* 1.85* 1.23* 1.34* 0.96  CALCIUM 8.4* 8.0* 8.8* 8.0* 8.2*  MG 2.0  --  2.0  --  2.2  PHOS  --   --  2.6  --   --    GFR: Estimated Creatinine Clearance: 76 mL/min (by C-G formula based on SCr of 0.96 mg/dL). Liver Function Tests: Recent Labs  Lab 03/13/21 2349 03/15/21 0248  AST 25 21  ALT 23 18  ALKPHOS 213* 148*  BILITOT 1.9* 1.6*  PROT 6.8 5.9*  ALBUMIN 2.7* 2.2*   No results for input(s): LIPASE, AMYLASE in the last 168 hours. No results for input(s): AMMONIA in the last 168 hours. Coagulation Profile: Recent Labs  Lab 03/13/21 2349 03/14/21 0443  INR 1.1 1.2   Cardiac Enzymes: No results for input(s): CKTOTAL, CKMB, CKMBINDEX, TROPONINI in the last 168 hours. BNP (last 3 results) No results for input(s): PROBNP in the last 8760 hours. HbA1C: No results for input(s): HGBA1C in the last 72 hours. CBG: Recent Labs  Lab 03/16/21 1136 03/16/21 1531 03/16/21 2011 03/17/21 0751 03/17/21 1218  GLUCAP 165* 171* 149* 127* 170*   Lipid Profile: No results for input(s): CHOL, HDL, LDLCALC, TRIG, CHOLHDL, LDLDIRECT in the last 72 hours. Thyroid Function Tests: No results for input(s): TSH, T4TOTAL, FREET4, T3FREE, THYROIDAB in the last 72 hours. Anemia Panel: No results for input(s): VITAMINB12, FOLATE, FERRITIN, TIBC, IRON, RETICCTPCT in the last 72 hours. Sepsis Labs: Recent Labs  Lab 03/14/21 0443 03/14/21 0748 03/15/21 0204 03/15/21 0444  PROCALCITON 4.26  --   --   --   LATICACIDVEN 1.7 1.6 1.6 1.6    Recent Results (from the past 240 hour(s))  Culture, blood (Routine x 2)     Status: Abnormal   Collection Time: 03/13/21 11:49 PM    Specimen: BLOOD  Result Value Ref Range Status   Specimen Description   Final    BLOOD LEFT ASSIST CONTROL Performed at [facility], [address] [place], [place] [address]    Special Requests   Final    BOTTLES DRAWN AEROBIC AND ANAEROBIC Blood Culture adequate volume Performed at [facility], [address] [place], [place] [address]    Culture  Setup Time   Final    GRAM POSITIVE COCCI IN BOTH AEROBIC AND ANAEROBIC BOTTLES CRITICAL RESULT CALLED TO, READ BACK BY AND VERIFIED WITH: ALEX CHAPPELL AT [place] 03/14/21.PMF Performed at [facility], [place] [address] [place], [place] [address]    Culture STAPHYLOCOCCUS AUREUS (A)  Final   Report Status 03/17/2021 FINAL  Final   Organism ID, Bacteria STAPHYLOCOCCUS AUREUS  Final      Susceptibility   Staphylococcus aureus - MIC*    CIPROFLOXACIN >=8 RESISTANT Resistant     ERYTHROMYCIN RESISTANT Resistant     GENTAMICIN <=0.5 SENSITIVE Sensitive     OXACILLIN 0.5 SENSITIVE Sensitive     TETRACYCLINE <=1 SENSITIVE Sensitive  VANCOMYCIN <=0.5 SENSITIVE Sensitive     TRIMETH/SULFA <=10 SENSITIVE Sensitive     CLINDAMYCIN RESISTANT Resistant     RIFAMPIN <=0.5 SENSITIVE Sensitive     Inducible Clindamycin POSITIVE Resistant     * STAPHYLOCOCCUS AUREUS  Culture, blood (Routine x 2)     Status: Abnormal   Collection Time: 03/13/21 11:49 PM   Specimen: BLOOD  Result Value Ref Range Status   Specimen Description   Final    BLOOD RIGHT FOREARM Performed at [facility], [address] [place], [place] [address]    Special Requests   Final    BOTTLES DRAWN AEROBIC AND ANAEROBIC Blood Culture adequate volume Performed at [facility], [place] [place], [place] [address]    Culture  Setup Time   Final    GRAM POSITIVE COCCI IN BOTH AEROBIC AND ANAEROBIC BOTTLES CRITICAL RESULT CALLED TO, READ BACK BY AND VERIFIED WITH: ALEX CHAPPELL AT [place] 03/14/21.PMF Performed at [facility], [address] [place], [place] [address]    Culture (A)  Final    STAPHYLOCOCCUS AUREUS SUSCEPTIBILITIES PERFORMED ON PREVIOUS CULTURE WITHIN THE LAST 5 DAYS. Performed at [facility], [place] [address] [place], [place] [address]    Report Status 03/17/2021 FINAL  Final  Resp Panel by RT-PCR (Flu A&B, Covid) Nasopharyngeal Swab     Status: None   Collection Time: 03/13/21 11:49 PM   Specimen: Nasopharyngeal Swab; Nasopharyngeal(NP) swabs in vial transport medium  Result Value Ref Range Status   SARS Coronavirus 2 by RT PCR NEGATIVE NEGATIVE Final    Comment: (NOTE) SARS-CoV-2 target nucleic acids are NOT DETECTED.  The SARS-CoV-2 RNA is generally detectable in upper respiratory specimens during the acute phase of infection. The lowest concentration of SARS-CoV-2 viral copies this assay can detect is 138 copies/mL. A negative result does not preclude SARS-Cov-2 infection and should not be used as the sole basis for treatment or other patient management decisions. A negative result may occur with  improper specimen collection/handling, submission of specimen other than nasopharyngeal swab, presence of viral mutation(s) within the areas targeted by this assay, and inadequate number of viral copies(<138 copies/mL). A negative result must be combined with clinical observations, patient history, and epidemiological information. The expected result is Negative.  Fact Sheet for Patients:  EntrepreneurPulse.com.au  Fact Sheet for Healthcare Providers:  IncredibleEmployment.be  This test is no t yet approved or cleared by the [place] FDA and  has been authorized for detection and/or diagnosis of SARS-CoV-2 by FDA under an Emergency Use Authorization (EUA). This EUA will remain  in effect (meaning this test can be used) for the duration of the COVID-19 declaration under Section 564(b)(1) of the Act, 21 U.S.C.section  360bbb-3(b)(1), unless the authorization is terminated  or revoked sooner.       Influenza A by PCR NEGATIVE NEGATIVE Final   Influenza B by PCR NEGATIVE NEGATIVE Final    Comment: (NOTE) The Xpert Xpress SARS-CoV-2/FLU/RSV plus assay is intended as an aid in the diagnosis of influenza from Nasopharyngeal swab specimens and should not be used as a sole basis for treatment. Nasal washings and aspirates are unacceptable for Xpert Xpress SARS-CoV-2/FLU/RSV testing.  Fact Sheet for Patients: EntrepreneurPulse.com.au  Fact Sheet for Healthcare Providers: IncredibleEmployment.be  This test is not yet approved or cleared by the [place] FDA and has been authorized for detection and/or diagnosis of SARS-CoV-2 by FDA under an Emergency Use Authorization (EUA). This EUA will remain  in effect (meaning this test can be used) for the duration of the COVID-19 declaration under Section 564(b)(1) of the Act, 21 U.S.C. section 360bbb-3(b)(1), unless the authorization is terminated or revoked.  Performed at [facility], [address] [place], [place] [address]   Blood Culture ID Panel (Reflexed)     Status: Abnormal   Collection Time: 03/13/21 11:49 PM  Result Value Ref Range Status   Enterococcus faecalis NOT DETECTED NOT DETECTED Final   Enterococcus Faecium NOT DETECTED NOT DETECTED Final   Listeria monocytogenes NOT DETECTED NOT DETECTED Final   Staphylococcus species DETECTED (A) NOT DETECTED Final    Comment: CRITICAL RESULT CALLED TO, READ BACK BY AND VERIFIED WITH: ALEX CHAPPELL AT 1314 03/14/21.PMF    Staphylococcus aureus (BCID) DETECTED (A) NOT DETECTED Final    Comment: CRITICAL RESULT CALLED TO, READ BACK BY AND VERIFIED WITH: ALEX CHAPPELL AT 1314 03/14/21.PMF    Staphylococcus epidermidis NOT DETECTED NOT DETECTED Final   Staphylococcus lugdunensis NOT DETECTED NOT DETECTED Final   Streptococcus species NOT DETECTED NOT  DETECTED Final   Streptococcus agalactiae NOT DETECTED NOT DETECTED Final   Streptococcus pneumoniae NOT DETECTED NOT DETECTED Final   Streptococcus pyogenes NOT DETECTED NOT DETECTED Final   A.calcoaceticus-baumannii NOT DETECTED NOT DETECTED Final   Bacteroides fragilis NOT DETECTED NOT DETECTED Final   Enterobacterales NOT DETECTED NOT DETECTED Final   Enterobacter cloacae complex NOT DETECTED NOT DETECTED Final   Escherichia coli NOT DETECTED NOT DETECTED Final   Klebsiella aerogenes NOT DETECTED NOT DETECTED Final   Klebsiella oxytoca NOT DETECTED NOT DETECTED Final   Klebsiella pneumoniae NOT DETECTED NOT DETECTED Final   Proteus species NOT DETECTED NOT DETECTED Final   Salmonella species NOT DETECTED NOT DETECTED Final   Serratia marcescens NOT DETECTED NOT DETECTED Final   Haemophilus influenzae NOT DETECTED NOT DETECTED Final   Neisseria meningitidis NOT DETECTED NOT DETECTED Final   Pseudomonas aeruginosa NOT DETECTED NOT DETECTED Final   Stenotrophomonas maltophilia NOT DETECTED NOT DETECTED Final   Candida albicans NOT DETECTED NOT DETECTED Final   Candida auris NOT DETECTED NOT DETECTED Final   Candida glabrata NOT DETECTED NOT DETECTED Final   Candida krusei NOT DETECTED NOT DETECTED Final   Candida parapsilosis NOT DETECTED NOT DETECTED Final   Candida tropicalis NOT DETECTED NOT DETECTED Final   Cryptococcus neoformans/gattii NOT DETECTED NOT DETECTED Final   Meth resistant mecA/C and MREJ NOT DETECTED NOT DETECTED Final    Comment: Performed at [facility], [address] [place], [place] [address]  Aerobic/Anaerobic Culture w Gram Stain (surgical/deep wound)     Status: None (Preliminary result)   Collection Time: 03/13/21 11:50 PM   Specimen: Ankle; Wound  Result Value Ref Range Status   Specimen Description   Final    ANKLE Performed at [facility], [address] [place], [place] [address]    Special Requests   Final     NONE Performed at [facility], [place], [place] [address]    Gram Stain   Final    NO SQUAMOUS EPITHELIAL CELLS SEEN FEW WBC SEEN MODERATE GRAM POSITIVE COCCI Performed at [facility], [place] [address], [place], [place] [address]    Culture   Final    MODERATE STAPHYLOCOCCUS AUREUS FEW STAPHYLOCOCCUS LUGDUNENSIS NO ANAEROBES ISOLATED; CULTURE IN PROGRESS FOR 5 DAYS    Report Status PENDING  Incomplete   Organism ID, Bacteria STAPHYLOCOCCUS AUREUS  Final   Organism  ID, Bacteria STAPHYLOCOCCUS LUGDUNENSIS  Final      Susceptibility   Staphylococcus aureus - MIC*    CIPROFLOXACIN >=8 RESISTANT Resistant     ERYTHROMYCIN >=8 RESISTANT Resistant     GENTAMICIN <=0.5 SENSITIVE Sensitive     OXACILLIN 0.5 SENSITIVE Sensitive     TETRACYCLINE <=1 SENSITIVE Sensitive     VANCOMYCIN 1 SENSITIVE Sensitive     TRIMETH/SULFA <=10 SENSITIVE Sensitive     CLINDAMYCIN RESISTANT Resistant     RIFAMPIN <=0.5 SENSITIVE Sensitive     Inducible Clindamycin POSITIVE Resistant     * MODERATE STAPHYLOCOCCUS AUREUS   Staphylococcus lugdunensis - MIC*    CIPROFLOXACIN <=0.5 SENSITIVE Sensitive     ERYTHROMYCIN <=0.25 SENSITIVE Sensitive     GENTAMICIN <=0.5 SENSITIVE Sensitive     OXACILLIN 2 SENSITIVE Sensitive     TETRACYCLINE <=1 SENSITIVE Sensitive     VANCOMYCIN <=0.5 SENSITIVE Sensitive     TRIMETH/SULFA <=10 SENSITIVE Sensitive     CLINDAMYCIN <=0.25 SENSITIVE Sensitive     RIFAMPIN <=0.5 SENSITIVE Sensitive     Inducible Clindamycin NEGATIVE Sensitive     * FEW STAPHYLOCOCCUS LUGDUNENSIS  Urine Culture     Status: None   Collection Time: 03/14/21 10:27 AM   Specimen: Urine, Random  Result Value Ref Range Status   Specimen Description   Final    URINE, RANDOM Performed at [facility], [address] [place], [place] [address]    Special Requests   Final    NONE Performed at [facility], [address], [place], [place]  [address]    Culture   Final    NO GROWTH Performed at [facility], [place] [address], [place], [place] [address]    Report Status 03/15/2021 FINAL  Final  CULTURE, BLOOD (ROUTINE X 2) w Reflex to ID Panel     Status: None (Preliminary result)   Collection Time: 03/15/21  2:05 AM   Specimen: BLOOD  Result Value Ref Range Status   Specimen Description BLOOD BLOOD LEFT FOREARM  Final   Special Requests   Final    BOTTLES DRAWN AEROBIC AND ANAEROBIC Blood Culture adequate volume   Culture   Final    NO GROWTH 2 DAYS Performed at [facility], [address] [place], [place] [address]    Report Status PENDING  Incomplete  CULTURE, BLOOD (ROUTINE X 2) w Reflex to ID Panel     Status: None (Preliminary result)   Collection Time: 03/15/21  4:44 AM   Specimen: BLOOD  Result Value Ref Range Status   Specimen Description BLOOD BLOOD LEFT FOREARM  Final   Special Requests   Final    BOTTLES DRAWN AEROBIC AND ANAEROBIC Blood Culture adequate volume   Culture   Final    NO GROWTH 2 DAYS Performed at [facility], [address], [place], [place] [address]    Report Status PENDING  Incomplete         Radiology Studies: ECHOCARDIOGRAM COMPLETE  Result Date: 03/16/2021    ECHOCARDIOGRAM REPORT   Patient Name:   Rebecca Park Date of Exam: 03/16/2021 Medical Rec #:  [id number]      Height:       66.0 in Accession #:    [id number]     Weight:       230.0 lb Date of Birth:  [birth year]       BSA:          2.122 m  Patient Age:    55 years       BP:           98/64 mmHg Patient Gender: F              HR:           113 bpm. Exam Location:  [facility] Procedure: 2D Echo, Color Doppler and Cardiac Doppler Indications:     R78.81 Bacteremia  History:         Patient has prior history of Echocardiogram examinations. Risk                  Factors:Hypertension and Diabetes.  Sonographer:     Charmayne Sheer Referring Phys:  [id number] Zara Chess Diagnosing Phys: Kathlyn Sacramento MD  Sonographer  Comments: Technically difficult study due to poor echo windows and no subcostal window. Image acquisition challenging due to patient body habitus. IMPRESSIONS  1. Left ventricular ejection fraction, by estimation, is 55 to 60%. The left ventricle has normal function. Left ventricular endocardial border not optimally defined to evaluate regional wall motion. Left ventricular diastolic parameters are indeterminate.  2. Right ventricular systolic function is normal. The right ventricular size is normal. Tricuspid regurgitation signal is inadequate for assessing PA pressure.  3. The mitral valve is normal in structure. No evidence of mitral valve regurgitation. No evidence of mitral stenosis.  4. The aortic valve is normal in structure. Aortic valve regurgitation is not visualized. Aortic valve sclerosis/calcification is present, without any evidence of aortic stenosis.  5. The inferior vena cava is dilated in size with <50% respiratory variability, suggesting right atrial pressure of 15 mmHg.  6. Very suboptimal images. FINDINGS  Left Ventricle: Left ventricular ejection fraction, by estimation, is 55 to 60%. The left ventricle has normal function. Left ventricular endocardial border not optimally defined to evaluate regional wall motion. The left ventricular internal cavity size was normal in size. There is no left ventricular hypertrophy. Left ventricular diastolic parameters are indeterminate. Right Ventricle: The right ventricular size is normal. No increase in right ventricular wall thickness. Right ventricular systolic function is normal. Tricuspid regurgitation signal is inadequate for assessing PA pressure. Left Atrium: Left atrial size was normal in size. Right Atrium: Right atrial size was normal in size. Pericardium: There is no evidence of pericardial effusion. Mitral Valve: The mitral valve is normal in structure. No evidence of mitral valve regurgitation. No evidence of mitral valve stenosis. MV peak  gradient, 7.3 mmHg. The mean mitral valve gradient is 3.0 mmHg. Tricuspid Valve: The tricuspid valve is normal in structure. Tricuspid valve regurgitation is not demonstrated. No evidence of tricuspid stenosis. Aortic Valve: The aortic valve is normal in structure. Aortic valve regurgitation is not visualized. Aortic valve sclerosis/calcification is present, without any evidence of aortic stenosis. Aortic valve mean gradient measures 4.0 mmHg. Aortic valve peak  gradient measures 7.8 mmHg. Aortic valve area, by VTI measures 1.39 cm. Pulmonic Valve: The pulmonic valve was normal in structure. Pulmonic valve regurgitation is not visualized. No evidence of pulmonic stenosis. Aorta: The aortic root is normal in size and structure. Venous: The inferior vena cava is dilated in size with less than 50% respiratory variability, suggesting right atrial pressure of 15 mmHg. IAS/Shunts: No atrial level shunt detected by color flow Doppler.  LEFT VENTRICLE PLAX 2D LVIDd:         4.46 cm   Diastology LVIDs:         3.09 cm   LV e' medial:  13.60 cm/s LV PW:         0.98 cm   LV E/e' medial:  8.8 LV IVS:        0.80 cm   LV e' lateral:   11.30 cm/s LVOT diam:     1.80 cm   LV E/e' lateral: 10.6 LV SV:         29 LV SV Index:   14 LVOT Area:     2.54 cm  LEFT ATRIUM           Index LA diam:      3.70 cm 1.74 cm/m LA Vol (A4C): 46.1 ml 21.72 ml/m  AORTIC VALVE                    PULMONIC VALVE AV Area (Vmax):    1.21 cm     PV Vmax:       0.99 m/s AV Area (Vmean):   1.16 cm     PV Vmean:      71.700 cm/s AV Area (VTI):     1.39 cm     PV VTI:        0.184 m AV Vmax:           140.00 cm/s  PV Peak grad:  3.9 mmHg AV Vmean:          93.700 cm/s  PV Mean grad:  2.0 mmHg AV VTI:            0.207 m AV Peak Grad:      7.8 mmHg AV Mean Grad:      4.0 mmHg LVOT Vmax:         66.60 cm/s LVOT Vmean:        42.700 cm/s LVOT VTI:          0.113 m LVOT/AV VTI ratio: 0.55  AORTA Ao Root diam: 3.10 cm MITRAL VALVE MV Area (PHT): 6.71 cm      SHUNTS MV Area VTI:   1.53 cm     Systemic VTI:  0.11 m MV Peak grad:  7.3 mmHg     Systemic Diam: 1.80 cm MV Mean grad:  3.0 mmHg MV Vmax:       1.35 m/s MV Vmean:      72.2 cm/s MV Decel Time: 113 msec MV E velocity: 119.23 cm/s Kathlyn Sacramento MD Electronically signed by Kathlyn Sacramento MD Signature Date/Time: 03/16/2021/9:24:34 AM    Final         Scheduled Meds:  atorvastatin  40 mg Oral Daily   enoxaparin (LOVENOX) injection  0.5 mg/kg Subcutaneous Q24H   insulin aspart  0-20 Units Subcutaneous TID AC & HS   insulin glargine-yfgn  8 Units Subcutaneous BID   insulin starter kit- pen needles  1 kit Other Once   metoCLOPramide (REGLAN) injection  10 mg Intravenous Q8H   multivitamin with minerals   Oral Daily   pantoprazole (PROTONIX) IV  40 mg Intravenous Q12H   scopolamine  1 patch Transdermal Q72H   Continuous Infusions:   ceFAZolin (ANCEF) IV 2 g (03/17/21 0517)   promethazine (PHENERGAN) injection (IM or IVPB) 12.5 mg (03/16/21 1626)     LOS: 3 days    Time spent: 25 minutes    Sidney Ace, MD [facility]   If 7PM-7AM, please contact night-coverage  03/17/2021, 1:24 PM

## 2021-03-17 NOTE — Progress Notes (Signed)
Inpatient Diabetes Program Recommendations  AACE/ADA: New Consensus Statement on Inpatient Glycemic Control   Target Ranges:  Prepandial:   less than 140 mg/dL      Peak postprandial:   less than 180 mg/dL (1-2 hours)      Critically ill patients:  140 - 180 mg/dL    Latest Reference Range & Units 03/16/21 08:06 03/16/21 11:36 03/16/21 15:31 03/16/21 20:11 03/17/21 07:51  Glucose-Capillary 70 - 99 mg/dL 158 (H) 165 (H) 171 (H) 149 (H) 127 (H)    Latest Reference Range & Units 03/14/21 04:43  Hemoglobin A1C 4.8 - 5.6 % 10.5 (H)   Review of Glycemic Control  Diabetes history: DM2 Outpatient Diabetes medications: Glipzide 10 mg BID, Trulicity 1.5 mg Qweek Current orders for Inpatient glycemic control: Semglee 8 units BID, Novolog 0-20 units TID with meals and bedtime   Inpatient Diabetes Program Recommendations:     HbgA1C: A1C 10.5% on 03/14/21 indicating an average glucose of 255 mg/dl over the past 2-3 months.  NOTE: Met with patient and her wife at bedside; she is much more alert today. Patient reports that she remembers our discussion yesterday about DM control. Patient states she would be willing to take insulin outpatient if prescribed at discharge. Patient's wife uses an insulin pump so she is familiar with insulin. Educated patient and spouse on insulin pen use at home. Reviewed all steps of insulin pen including attachment of needle, 2-unit air shot, dialing up dose, giving injection, removing needle, disposal of sharps, storage of unused insulin, disposal of insulin etc. Patient able to provide successful return demonstration. If patient is discharged new to insulin, provider will need to provide patient Rxs for insulin pens and insulin pen needles. Patient expressed interest in using FreeStyle Libre2 CGM as an outpatient to monitor glucose more frequently. Dr. Priscella Mann provided order to give patient samples of FreeStyle Libre2 CGM sensors prior to discharge. Informed patient that  diabetes coordinator will plan to bring FreeStyle Libre2 sensors Wednesday or Thursday and assist her to get one applied. Patient verbalized understanding of information and states she has no questions at this time. Ordered insulin pen starter kit and patient education by bedside RNs to continue to educate on insulin administration and allow patient to administer insulin to herself while inpatient.  Thanks, Barnie Alderman, RN, MSN, CDE Diabetes Coordinator Inpatient Diabetes Program [phone] (Team Pager from 8am to 5pm)

## 2021-03-17 NOTE — TOC Progression Note (Signed)
Transition of Care [facility]) - Progression Note    Patient Details  Name: Rebecca Park MRN: [id number] Date of Birth: Apr 13, 1960  Transition of Care [facility]) CM/SW [place], RN Phone Number: 03/17/2021, 12:25 PM  Clinical Narrative:   Requested Benefit check to see if STR SNF is covered under her plan, Will review for bedsearch after benefits are confirmed    Expected Discharge Plan: [place] Barriers to Discharge: Continued Medical Work up  Expected Discharge Plan and Services Expected Discharge Plan: [place]   Discharge Planning Services: CM Consult   Living arrangements for the past 2 months: Single Family Home                 DME Arranged: Youth worker wheelchair with seat cushion, 3-N-1 DME Agency: [employer] Date DME Agency Contacted: 03/17/21 Time DME Agency Contacted: [id number] Representative spoke with at DME Agency: [facility] HH Arranged: PT, OT HH Agency: [facility] Date [place]: 03/17/21 Time [place]: [place] Representative spoke with at [place]: [place] ([place]) Interventions    Readmission Risk Interventions No flowsheet data found.

## 2021-03-17 NOTE — Progress Notes (Signed)
Met with the patient to discuss DC plan and needs, Her wife and mother were also in the room, She has a RW at home but will need a Wheelchair and a 3 in 1, [employer] will deliver to the room prior to [place] accepted her for [facility] services She has transportation and can afford her medication

## 2021-03-17 NOTE — Progress Notes (Addendum)
° ° °  [facility] has been requested to perform a transesophageal echocardiogram on Rebecca Park for bacteremia.  After careful review of history and examination, the risks and benefits of transesophageal echocardiogram have been explained including risks of esophageal damage, perforation (1:10,000 risk), bleeding, pharyngeal hematoma as well as other potential complications associated with conscious sedation including aspiration, arrhythmia, respiratory failure and death. Alternatives to treatment were discussed, questions were answered. Patient is willing to proceed. We will tentatively schedule for the morning of 12/21 w/ Dr. Rockey Situ.  Murray Hodgkins, NP  03/17/2021 1:39 PM

## 2021-03-17 NOTE — Plan of Care (Signed)
°  Problem: Education: Goal: Knowledge of General Education information will improve Description: Including pain rating scale, medication(s)/side effects and non-pharmacologic comfort measures Outcome: Progressing   Problem: Nutrition: Goal: Adequate nutrition will be maintained Outcome: Progressing   Problem: Pain Managment: Goal: General experience of comfort will improve Outcome: Progressing   Problem: Safety: Goal: Ability to remain free from injury will improve Outcome: Progressing   Problem: Skin Integrity: Goal: Risk for impaired skin integrity will decrease Outcome: Progressing   Problem: Skin Integrity: Goal: Risk for impaired skin integrity will decrease Outcome: Progressing   Problem: Self-Care: Goal: Ability to meet self-care needs will improve Outcome: Progressing   Problem: Skin Integrity: Goal: Demonstration of wound healing without infection will improve Outcome: Progressing

## 2021-03-17 NOTE — Progress Notes (Signed)
Physical Therapy Treatment Patient Details Name: Rebecca Park MRN: [id number] DOB: [birth year] Today's Date: 03/17/2021   History of Present Illness Rebecca Park is a 60yoF who comes to [facility] on [facility]13[facility]21 c N/V, hyperglycemia. Pt missed her OP FU with podiatry due to feeling ill for days. 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 Julianne Rice.    PT Comments    Pt seen prior to lunch, received in bed, family at bedside. Pt improved from yesterday stating no longer nauseous. Discussed POC and set goals. Educated pt on desensitization techniques for L stump. Pt agreed to mobility, transitioning to edge of bed with stand by and use of side rail. Several attempts to stand from raised bed to RW with MaxA, pt only able to attain upright standing once for 5 seconds due to weakness throughout. Pt assisted to recliner via slide board transfer with MaxA and continuous cuing for proper technique. Despite feeling better, pt remains weak and unable to return home without being able to transfer with less assistance. Pt also noted to have fluid retention, and continues to desat into mid 70's with exertion on RA. Pt would benefit from continued PT at SNF or at least a few more days in house with OT/PT.    Recommendations for follow up therapy are one component of a multi-disciplinary discharge planning process, led by the attending physician.  Recommendations may be updated based on patient status, additional functional criteria and insurance authorization.  Follow Up Recommendations  Home health PT     Assistance Recommended at Discharge Set up Supervision/Assistance  Equipment Recommendations  Wheelchair (measurements PT);Wheelchair cushion (measurements PT)    Recommendations for Other Services        Precautions / Restrictions Precautions Precautions: Fall Precaution Comments: new LLE BKA Restrictions Weight Bearing Restrictions: Yes LLE Weight Bearing: Non weight bearing     Mobility  Bed Mobility Overal bed mobility: Needs Assistance Bed Mobility: Supine to Sit;Sit to Supine     Supine to sit: Min guard;HOB elevated Sit to supine: Min guard        Transfers Overall transfer level: Needs assistance Equipment used: Rolling walker (2 wheels) Transfers: Sit to/from Stand Sit to Stand: Max assist;From elevated surface           General transfer comment:  (Several tries to attain a single stance at RW from raised bed for ~5 seconds due to weakness.)    Ambulation/Gait                   Stairs             Wheelchair Mobility    Modified Rankin (Stroke Patients Only)       Balance Overall balance assessment: Needs assistance Sitting-balance support: No upper extremity supported;Feet supported;Feet unsupported Sitting balance-Leahy Scale: Good     Standing balance support: Bilateral upper extremity supported Standing balance-Leahy Scale: Poor Standing balance comment:  (Pt stood briefly at [employer])                            Cognition Arousal/Alertness: Awake/alert Behavior During Therapy: WFL for tasks assessed/performed Overall Cognitive Status: Within Functional Limits for tasks assessed  Exercises Other Exercises Other Exercises: Pt and family given information on slideboard transfers, residual limb wrapping    General Comments General comments (skin integrity, edema, etc.): Pt and wife educated on safe transfers, positioning of L BKA, current LOF, d/c plans      Pertinent Vitals/Pain Pain Assessment: No/denies pain    Home Living Family/patient expects to be discharged to:: Private residence Living Arrangements: Spouse/significant other Available Help at  Discharge: Family Type of Home: House Home Access: Stairs to enter;Ramped entrance (ramp being built today)   Technical brewer of Steps: 2   Home Layout: One level Home Equipment: Conservation officer, nature (2 wheels);Kasandra Knudsen - single point Additional Comments: Kneeing scooter for mobility since september;    Prior Function            PT Goals (current goals can now be found in the care plan section) Acute Rehab PT Goals Patient Stated Goal: return to home, maximize healing potential to allow for future prosthesis use    Frequency    7X/week      PT Plan      Co-evaluation              AM-PAC PT "6 Clicks" Mobility   Outcome Measure  Help needed turning from your back to your side while in a flat bed without using bedrails?: A Lot Help needed moving from lying on your back to sitting on the side of a flat bed without using bedrails?: A Lot Help needed moving to and from a bed to a chair (including a wheelchair)?: A Lot Help needed standing up from a chair using your arms (e.g., wheelchair or bedside chair)?: A Lot Help needed to walk in hospital room?: Total Help needed climbing 3-5 steps with a railing? : Total 6 Click Score: 10    End of Session Equipment Utilized During Treatment: Gait belt Activity Tolerance: Patient tolerated treatment well Patient left: in chair;with call bell/phone within reach;with family/visitor present Nurse Communication: Mobility status PT Visit Diagnosis: Difficulty in walking, not elsewhere classified (R26.2);Other abnormalities of gait and mobility (R26.89)     Time: 8916-9450 PT Time Calculation (min) (ACUTE ONLY): 61 min  Charges:  $Therapeutic Exercise: 8-22 mins $Therapeutic Activity: 38-52 mins                    Mikel Cella, PTA    [name] 03/17/2021, 2:58 PM

## 2021-03-17 NOTE — Evaluation (Signed)
Occupational Therapy Evaluation Patient Details Name: Rebecca Park MRN: [id number] DOB: 1960-04-26 Today's Date: 03/17/2021   History of Present Illness Rebecca Park is a 54yoF who comes to [facility] on [facility] c N/V, hyperglycemia. Pt missed her OP FU with podiatry due to feeling ill for days. 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 Julianne Rice.   Clinical Impression   Pt seen for OT evaluation this date. Pt was modified independent in ADL prior to surgery and using a knee scooter for short household mobility. Pt is eager to return to PLOF with less pain and improved safety and independence. Pt currently requires supervision-CGA for bed mobility, SBA for lateral scoots EOB, and ultimately unable to achieve standing even with MAX-TOTAL A with RW. Pt currently requires MOD A for LB ADL tasks using lateral leans, unable to transfer to [facility] for toileting. Pt and family instructed in bed mobility, desensitization strategies for management of residual limb, falls prevention strategies, home/routines modifications, DME/AE for LB bathing and dressing tasks, and residual limb positioning, stretching, and strengthening. Pt verbalized understanding and would benefit from skilled OT services including additional instruction in dressing techniques with or without assistive devices for dressing and bathing skills to support recall and carryover prior to discharge and ultimately to maximize safety, independence, and minimize falls risk and caregiver burden. Recommending SNF at this time to support return to baseline function prior to amputation with hopes pt can progress while hospitalized in order to return home.     Recommendations for follow up therapy are one component of a multi-disciplinary discharge  planning process, led by the attending physician.  Recommendations may be updated based on patient status, additional functional criteria and insurance authorization.   Follow Up Recommendations  Skilled nursing-short term rehab (<3 hours/day)    Assistance Recommended at Discharge Intermittent Supervision/Assistance  Functional Status Assessment  Patient has had a recent decline in their functional status and demonstrates the ability to make significant improvements in function in a reasonable and predictable amount of time.  Equipment Recommendations  BSC/3in1;Wheelchair (measurements OT);Wheelchair cushion (measurements OT)    Recommendations for Other Services       Precautions / Restrictions Precautions Precautions: Fall Precaution Comments: new LLE BKA Restrictions Weight Bearing Restrictions: Yes LLE Weight Bearing: Non weight bearing      Mobility Bed Mobility Overal bed mobility: Needs Assistance Bed Mobility: Supine to Sit;Sit to Supine     Supine to sit: Min guard;HOB elevated Sit to supine: Min guard        Transfers Overall transfer level: Needs assistance Equipment used: Rolling walker (2 wheels) Transfers: Sit to/from Stand Sit to Stand: Max assist;From elevated surface           General transfer comment: ultimately unable to achieve standing. Able to laterally scoot without direct assist      Balance Overall balance assessment: Needs assistance Sitting-balance support: No upper extremity supported;Feet supported;Feet unsupported Sitting balance-Leahy Scale: Good       Standing balance-Leahy Scale: Zero                             ADL either performed or assessed with clinical judgement   ADL Overall ADL's : Needs assistance/impaired  General ADL Comments: Pt currently requires MOD A for LB ADL tasks using lateral leans, unable to transfer to [facility] for toileting requiring bed  level toileting     Vision         Perception     Praxis      Pertinent Vitals/Pain Pain Assessment: No/denies pain     Hand Dominance     Extremity/Trunk Assessment Upper Extremity Assessment Upper Extremity Assessment: Overall WFL for tasks assessed   Lower Extremity Assessment Lower Extremity Assessment: Generalized weakness;LLE deficits/detail LLE Deficits / Details: s/p BKA, able to achieve full knee extension       Communication Communication Communication: No difficulties   Cognition Arousal/Alertness: Awake/alert Behavior During Therapy: WFL for tasks assessed/performed Overall Cognitive Status: Within Functional Limits for tasks assessed                                       General Comments       Exercises Other Exercises Other Exercises: Pt/family instructed in s/p BKA protocols for desensitization, positioning, and LE ex to promote full knee extension and minimize risk of knee flexion contracture, ADL transfer training, home/routines modifications   Shoulder Instructions      Home Living Family/patient expects to be discharged to:: Private residence Living Arrangements: Spouse/significant other Available Help at Discharge: Family Type of Home: House Home Access: Stairs to enter;Ramped entrance (ramp being built today) Technical brewer of Steps: 2   Home Layout: One level     Bathroom Shower/Tub: Astronomer Accessibility: Yes How Accessible: Accessible via wheelchair;Accessible via walker Home Equipment: [place] (2 wheels);Kasandra Knudsen - single point   Additional Comments: Kneeing scooter for mobility since september;      Prior Functioning/Environment Prior Level of Function : Independent/Modified Independent (no falls history)               ADLs Comments: seated baths        OT Problem List: Decreased strength;Impaired balance (sitting and/or standing);Decreased knowledge of use of DME or  AE;Obesity;Decreased knowledge of precautions      OT Treatment/Interventions: Self-care/ADL training;Therapeutic exercise;Therapeutic activities;DME and/or AE instruction;Patient/family education;Balance training    OT Goals(Current goals can be found in the care plan section) Acute Rehab OT Goals Patient Stated Goal: get stronger OT Goal Formulation: With patient/family Time For Goal Achievement: 03/31/21 Potential to Achieve Goals: Good ADL Goals Pt Will Perform Lower Body Dressing: sitting/lateral leans;with modified independence Pt Will Transfer to Toilet: with min assist;stand pivot transfer;bedside commode (LRAD PRN) Additional ADL Goal #1: Pt will demonstrate independence with learned L residual limb positioning and desensitization strategies to support preparation for future prosthetic.  OT Frequency: Min 3X/week   Barriers to D/C:            Co-evaluation              AM-PAC OT "6 Clicks" Daily Activity     Outcome Measure Help from another person eating meals?: None Help from another person taking care of personal grooming?: None Help from another person toileting, which includes using toliet, bedpan, or urinal?: A Lot Help from another person bathing (including washing, rinsing, drying)?: A Lot Help from another person to put on and taking off regular upper body clothing?: None Help from another person to put on and taking off regular lower body clothing?: A Lot 6 Click Score: 18   End  of Session Equipment Utilized During Treatment: Rolling walker (2 wheels);Gait belt Nurse Communication: Mobility status  Activity Tolerance: Patient tolerated treatment well Patient left: in bed;with call bell/phone within reach;with bed alarm set;with family/visitor present  OT Visit Diagnosis: Other abnormalities of gait and mobility (R26.89);Muscle weakness (generalized) (M62.81)                Time: 9244-6286 OT Time Calculation (min): 40 min Charges:  OT General  Charges $OT Visit: 1 Visit OT Evaluation $OT Eval Moderate Complexity: 1 Mod OT Treatments $Self Care/Home Management : 8-22 mins $Therapeutic Activity: 8-22 mins  Ardeth Perfect., MPH, MS, OTR/L ascom [phone] 03/17/21, 12:50 PM

## 2021-03-17 NOTE — TOC Benefit Eligibility Note (Signed)
Transition of Care (TOC) Benefit Eligibility Note  ° ° °Patient Details  °Name: Shanekia L Bille °MRN: [id number] °Date of Birth: 12/20/1960 ° °SNF Benefits Check - Plan Details: ° °Number called: [phone] °Rep: M.J. °Reference Number: [id number] ° °[employer] PPO Blue Option Plan, effective 03/29/2020 - 03/28/2021 ° °In-Network Deductible: $3,000 - met °In-Network Out-Of-Pocket-Max: $6,600 - $3,896.43 met as of time of call ° °Out-Of-Network Deductible: $7,000 - $0 met as of time of call °Out-Of-Network Out-Of-Pocket Max: $13,200 - $0 met as of time of call ° °In-Network SNF Benefits: ° °After meeting deductible, responsible for 30% co-insurance. Once OOP met, covered at 100% of the allowed amount for services. ° °Limited to 60 days per calendar year - all available upon time of call. ° ° °Out-Of-Network SNF Benefits:   ° °After meeting deductible, responsible for 60% co-insurance. Once OOP met, covered at 100% of the allowed amount for services. ° °Limited to 60 days per calendar year - all available upon time of call. ° ° ° °[place] °Phone Number: [phone] or [phone]  °03/17/2021, 3:14 PM ° ° ° ° °

## 2021-03-17 NOTE — Progress Notes (Signed)
Date of Admission:  03/13/2021     ID: Rebecca Park is a 60 y.o. female Active Problems:   Osteomyelitis ([place])    Subjective: Patient doing much better No nausea or vomiting Had lunch today  Medications:   atorvastatin  40 mg Oral Daily   enoxaparin (LOVENOX) injection  0.5 mg/kg Subcutaneous Q24H   insulin aspart  0-20 Units Subcutaneous TID AC & HS   insulin glargine-yfgn  8 Units Subcutaneous BID   insulin starter kit- pen needles  1 kit Other Once   metoCLOPramide (REGLAN) injection  10 mg Intravenous Q8H   multivitamin with minerals   Oral Daily   pantoprazole (PROTONIX) IV  40 mg Intravenous Q12H   scopolamine  1 patch Transdermal Q72H    Objective: Vital signs in last 24 hours: Temp:  [97.9 F (36.6 C)-98.6 F (37 C)] 98.6 F (37 C) (12/20 1619) Pulse Rate:  [92-102] 102 (12/20 1619) Resp:  [16-20] 18 (12/20 1619) BP: (97-116)/(59-69) 99/62 (12/20 1619) SpO2:  [90 %-99 %] 90 % (12/20 1619)  PHYSICAL EXAM:  General: Alert, cooperative, no distress, appears stated age.  Head: Normocephalic, without obvious abnormality, atraumatic. Eyes: Conjunctivae clear, anicteric sclerae. Pupils are equal ENT Nares normal. No drainage or sinus tenderness. Lips, mucosa, and tongue normal. No Thrush Neck: Supple, symmetrical, no adenopathy, thyroid: non tender no carotid bruit and no JVD. Back: No CVA tenderness. Lungs: Clear to auscultation bilaterally. No Wheezing or Rhonchi. No rales. Heart: Regular rate and rhythm, no murmur, rub or gallop. Abdomen: Soft, non-tender,not distended. Bowel sounds normal. No masses Extremities: Left BKA surgical site covered with dressing skin: No rashes or lesions. Or bruising Lymph: Cervical, supraclavicular normal. Neurologic: Grossly non-focal  Lab Results Recent Labs    03/15/21 0444 03/16/21 0452 03/17/21 0429  WBC 12.2*  --  16.2*  HGB 9.5*  --  9.5*  HCT 28.9*  --  29.8*  NA 130* 133* 138  K 3.7 3.7 3.6  CL 98 105 105   CO2 _0 BUN 30* 29* 22*  CREATININE 1.23* 1.34* 0.96   Liver Panel Recent Labs    03/15/21 0248  PROT 5.9*  ALBUMIN 2.2*  AST 21  ALT 18  ALKPHOS 148*  BILITOT 1.6*  BILIDIR 0.7*  IBILI 0.9    Microbiology:03/13/2021 blood culture MSSA 03/15/2021 blood culture no growth so far 03/13/2021 surgical wound culture MSSA   Studies/Results: ECHOCARDIOGRAM COMPLETE  Result Date: 03/16/2021    ECHOCARDIOGRAM REPORT   Patient Name:   Rebecca Park Date of Exam: 03/16/2021 Medical Rec #:  [id number]      Height:       66.0 in Accession #:    [id number]     Weight:       230.0 lb Date of Birth:  [birth year]       BSA:          2.122 m Patient Age:    87 years       BP:           98/64 mmHg Patient Gender: F              HR:           113 bpm. Exam Location:  [facility] Procedure: 2D Echo, Color Doppler and Cardiac Doppler Indications:     R78.81 Bacteremia  History:         Patient has prior history of Echocardiogram examinations. Risk  Factors:Hypertension and Diabetes.  Sonographer:     Charmayne Sheer Referring Phys:  [id number] Zara Chess Diagnosing Phys: Kathlyn Sacramento MD  Sonographer Comments: Technically difficult study due to poor echo windows and no subcostal window. Image acquisition challenging due to patient body habitus. IMPRESSIONS  1. Left ventricular ejection fraction, by estimation, is 55 to 60%. The left ventricle has normal function. Left ventricular endocardial border not optimally defined to evaluate regional wall motion. Left ventricular diastolic parameters are indeterminate.  2. Right ventricular systolic function is normal. The right ventricular size is normal. Tricuspid regurgitation signal is inadequate for assessing PA pressure.  3. The mitral valve is normal in structure. No evidence of mitral valve regurgitation. No evidence of mitral stenosis.  4. The aortic valve is normal in structure. Aortic valve regurgitation is not visualized. Aortic valve  sclerosis/calcification is present, without any evidence of aortic stenosis.  5. The inferior vena cava is dilated in size with <50% respiratory variability, suggesting right atrial pressure of 15 mmHg.  6. Very suboptimal images. FINDINGS  Left Ventricle: Left ventricular ejection fraction, by estimation, is 55 to 60%. The left ventricle has normal function. Left ventricular endocardial border not optimally defined to evaluate regional wall motion. The left ventricular internal cavity size was normal in size. There is no left ventricular hypertrophy. Left ventricular diastolic parameters are indeterminate. Right Ventricle: The right ventricular size is normal. No increase in right ventricular wall thickness. Right ventricular systolic function is normal. Tricuspid regurgitation signal is inadequate for assessing PA pressure. Left Atrium: Left atrial size was normal in size. Right Atrium: Right atrial size was normal in size. Pericardium: There is no evidence of pericardial effusion. Mitral Valve: The mitral valve is normal in structure. No evidence of mitral valve regurgitation. No evidence of mitral valve stenosis. MV peak gradient, 7.3 mmHg. The mean mitral valve gradient is 3.0 mmHg. Tricuspid Valve: The tricuspid valve is normal in structure. Tricuspid valve regurgitation is not demonstrated. No evidence of tricuspid stenosis. Aortic Valve: The aortic valve is normal in structure. Aortic valve regurgitation is not visualized. Aortic valve sclerosis/calcification is present, without any evidence of aortic stenosis. Aortic valve mean gradient measures 4.0 mmHg. Aortic valve peak  gradient measures 7.8 mmHg. Aortic valve area, by VTI measures 1.39 cm. Pulmonic Valve: The pulmonic valve was normal in structure. Pulmonic valve regurgitation is not visualized. No evidence of pulmonic stenosis. Aorta: The aortic root is normal in size and structure. Venous: The inferior vena cava is dilated in size with less than 50%  respiratory variability, suggesting right atrial pressure of 15 mmHg. IAS/Shunts: No atrial level shunt detected by color flow Doppler.  LEFT VENTRICLE PLAX 2D LVIDd:         4.46 cm   Diastology LVIDs:         3.09 cm   LV e' medial:    13.60 cm/s LV PW:         0.98 cm   LV E/e' medial:  8.8 LV IVS:        0.80 cm   LV e' lateral:   11.30 cm/s LVOT diam:     1.80 cm   LV E/e' lateral: 10.6 LV SV:         29 LV SV Index:   14 LVOT Area:     2.54 cm  LEFT ATRIUM           Index LA diam:      3.70 cm 1.74 cm/m LA Vol (A4C):  46.1 ml 21.72 ml/m  AORTIC VALVE                    PULMONIC VALVE AV Area (Vmax):    1.21 cm     PV Vmax:       0.99 m/s AV Area (Vmean):   1.16 cm     PV Vmean:      71.700 cm/s AV Area (VTI):     1.39 cm     PV VTI:        0.184 m AV Vmax:           140.00 cm/s  PV Peak grad:  3.9 mmHg AV Vmean:          93.700 cm/s  PV Mean grad:  2.0 mmHg AV VTI:            0.207 m AV Peak Grad:      7.8 mmHg AV Mean Grad:      4.0 mmHg LVOT Vmax:         66.60 cm/s LVOT Vmean:        42.700 cm/s LVOT VTI:          0.113 m LVOT/AV VTI ratio: 0.55  AORTA Ao Root diam: 3.10 cm MITRAL VALVE MV Area (PHT): 6.71 cm     SHUNTS MV Area VTI:   1.53 cm     Systemic VTI:  0.11 m MV Peak grad:  7.3 mmHg     Systemic Diam: 1.80 cm MV Mean grad:  3.0 mmHg MV Vmax:       1.35 m/s MV Vmean:      72.2 cm/s MV Decel Time: 113 msec MV E velocity: 119.23 cm/s Kathlyn Sacramento MD Electronically signed by Kathlyn Sacramento MD Signature Date/Time: 03/16/2021/9:24:34 AM    Final      Assessment/Plan: ? Diabetic foot infection of the left with sepsis  Charcot arthropathy. Medial malleolus ulcer with underlying osteomyelitis and septic arthritis. Status post BKA  MSSA bacteremia secondary to the above. Antibiotic has been de-escalated from Vanco and cefepime to cefazolin.  repeat blood cultures no growth so far 2D echo done.  TEE tomorrow.  Depending on the results will decide on duration of cefazolin.  She will need  PICC line and IV antibiotics on discharge.   Diabetes mellitus poorly controlled.  Now on insulin  AKI.  Resolved   Anemia.  Discussed the management with the patient.

## 2021-03-17 NOTE — Anesthesia Postprocedure Evaluation (Signed)
Anesthesia Post Note  Patient: Rebecca Park  Procedure(s) Performed: AMPUTATION BELOW KNEE (Left: Knee)  Patient location during evaluation: PACU Anesthesia Type: General Level of consciousness: awake and alert Pain management: pain level controlled Vital Signs Assessment: post-procedure vital signs reviewed and stable Respiratory status: spontaneous breathing, nonlabored ventilation, respiratory function stable and patient connected to nasal cannula oxygen Cardiovascular status: blood pressure returned to baseline and stable Postop Assessment: no apparent nausea or vomiting Anesthetic complications: no   No notable events documented.   Last Vitals:  Vitals:   03/17/21 0145 03/17/21 0509  BP: 107/63 116/69  Pulse: 97 98  Resp: 20 16  Temp: 36.9 C 36.9 C  SpO2: 97% 97%    Last Pain:  Vitals:   03/17/21 0231  TempSrc:   PainSc: [place] [name]

## 2021-03-17 NOTE — Progress Notes (Signed)
Physical Therapy Treatment Patient Details Name: Rebecca Park MRN: [id number] DOB: 1960/08/28 Today's Date: 03/17/2021   History of Present Illness Rebecca Park is a 77yoF who comes to [facility] on 03/13/21 c N/V, hyperglycemia. Pt missed her OP FU with podiatry due to feeling ill for days. 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 Julianne Park.    PT Comments    Pt seen in pm to assist with back to bed transfer via slide board after sitting up for ~3 hours.  Pt's wife present to assist. Pt required MaxA of 2 to transfer towards the Left side with slight incline of board.  Pt very limited with upper body strength. Once sitting at EOB, pt required ModA to return to supine. O2 sats down to 80% upon exertion on RA, slowly increasing to 96% after PLB technique and rest.  Pt was initially hoping to return home with home health services, however does not have the strength at this time to complete transfers and therefore SNF placement is being discussed for short term rehab.   Recommendations for follow up therapy are one component of a multi-disciplinary discharge planning process, led by the attending physician.  Recommendations may be updated based on patient status, additional functional criteria and insurance authorization.  Follow Up Recommendations  Home health PT     Assistance Recommended at Discharge Set up Supervision/Assistance  Equipment Recommendations  Wheelchair (measurements PT);Wheelchair cushion (measurements PT)    Recommendations for Other Services       Precautions / Restrictions Precautions Precautions: Fall Precaution Comments: new LLE BKA     Mobility  Bed Mobility Overal bed mobility: Needs Assistance Bed Mobility: Sit to Supine     Supine to sit: Min  guard;HOB elevated Sit to supine: Min guard;Min assist        Transfers Overall transfer level: Needs assistance Equipment used: Sliding board Transfers:  (chair to bed going towards L side) Sit to Stand: +2 physical assistance           General transfer comment:  (Pt required increased assist to transfer back to bed due to board being on an incline and overall weakness)    Ambulation/Gait               General Gait Details: non-ambulatory at this time, new ramp just built   Marine scientist Rankin (Stroke Patients Only)       Balance                                            Cognition Arousal/Alertness: Awake/alert Behavior During Therapy: WFL for tasks assessed/performed Overall Cognitive Status: Within Functional Limits for tasks assessed                                          Exercises Amputee Exercises Quad Sets: AROM;Left;10 reps Knee Extension: AROM;Left;10 reps    General Comments General comments (skin integrity, edema, etc.): Pt O2 sats decreased to 80%  during transfer, returning to 96% after 1 minute rest break and pursed lip breathing      Pertinent Vitals/Pain      Home Living                          Prior Function            PT Goals (current goals can now be found in the care plan section) Acute Rehab PT Goals Patient Stated Goal: return to home, maximize healing potential to allow for future prosthesis use    Frequency    7X/week      PT Plan      Co-evaluation              AM-PAC PT "6 Clicks" Mobility   Outcome Measure  Help needed turning from your back to your side while in a flat bed without using bedrails?: A Lot Help needed moving from lying on your back to sitting on the side of a flat bed without using bedrails?: A Lot Help needed moving to and from a bed to a chair (including a wheelchair)?: A Lot Help  needed standing up from a chair using your arms (e.g., wheelchair or bedside chair)?: A Lot Help needed to walk in hospital room?: Total Help needed climbing 3-5 steps with a railing? : Total 6 Click Score: 10    End of Session Equipment Utilized During Treatment: Gait belt Activity Tolerance: Patient tolerated treatment well Patient left: in bed;with call bell/phone within reach;with family/visitor present Nurse Communication: Mobility status PT Visit Diagnosis: Difficulty in walking, not elsewhere classified (R26.2);Other abnormalities of gait and mobility (R26.89)     Time: 1610-9604 PT Time Calculation (min) (ACUTE ONLY): 20 min  Charges:  $Therapeutic Activity: 8-22 mins          Rebecca Park, PTA  Rebecca Park 03/17/2021, 5:41 PM

## 2021-03-17 NOTE — Plan of Care (Signed)
°  Problem: Education: Goal: Knowledge of General Education information will improve Description: Including pain rating scale, medication(s)/side effects and non-pharmacologic comfort measures Outcome: Progressing   Problem: Health Behavior/Discharge Planning: Goal: Ability to manage health-related needs will improve Outcome: Progressing   Problem: Clinical Measurements: Goal: Ability to maintain clinical measurements within normal limits will improve Outcome: Progressing Goal: Will remain free from infection Outcome: Progressing Goal: Diagnostic test results will improve Outcome: Progressing Goal: Respiratory complications will improve Outcome: Progressing Goal: Cardiovascular complication will be avoided Outcome: Progressing   Problem: Activity: Goal: Risk for activity intolerance will decrease Outcome: Progressing   Problem: Nutrition: Goal: Adequate nutrition will be maintained Outcome: Progressing   Problem: Coping: Goal: Level of anxiety will decrease Outcome: Progressing   Problem: Elimination: Goal: Will not experience complications related to bowel motility Outcome: Progressing Goal: Will not experience complications related to urinary retention Outcome: Progressing   Problem: Pain Managment: Goal: General experience of comfort will improve Outcome: Progressing   Problem: Safety: Goal: Ability to remain free from injury will improve Outcome: Progressing   Problem: Skin Integrity: Goal: Risk for impaired skin integrity will decrease Outcome: Progressing   Problem: Education: Goal: Ability to describe self-care measures that may prevent or decrease complications (Diabetes Survival Skills Education) will improve Outcome: Progressing Goal: Individualized Educational Video(s) Outcome: Progressing   Problem: Coping: Goal: Ability to adjust to condition or change in health will improve Outcome: Progressing   Problem: Fluid Volume: Goal: Ability to  maintain a balanced intake and output will improve Outcome: Progressing   Problem: Health Behavior/Discharge Planning: Goal: Ability to identify and utilize available resources and services will improve Outcome: Progressing Goal: Ability to manage health-related needs will improve Outcome: Progressing   Problem: Metabolic: Goal: Ability to maintain appropriate glucose levels will improve Outcome: Progressing   Problem: Nutritional: Goal: Maintenance of adequate nutrition will improve Outcome: Progressing Goal: Progress toward achieving an optimal weight will improve Outcome: Progressing   Problem: Skin Integrity: Goal: Risk for impaired skin integrity will decrease Outcome: Progressing   Problem: Tissue Perfusion: Goal: Adequacy of tissue perfusion will improve Outcome: Progressing   Problem: Education: Goal: Knowledge of the prescribed therapeutic regimen will improve Outcome: Progressing Goal: Ability to verbalize activity precautions or restrictions will improve Outcome: Progressing Goal: Understanding of discharge needs will improve Outcome: Progressing   Problem: Activity: Goal: Ability to perform//tolerate increased activity and mobilize with assistive devices will improve Outcome: Progressing   Problem: Clinical Measurements: Goal: Postoperative complications will be avoided or minimized Outcome: Progressing   Problem: Self-Care: Goal: Ability to meet self-care needs will improve Outcome: Progressing   Problem: Self-Concept: Goal: Ability to maintain and perform role responsibilities to the fullest extent possible will improve Outcome: Progressing   Problem: Pain Management: Goal: Pain level will decrease with appropriate interventions Outcome: Progressing   Problem: Skin Integrity: Goal: Demonstration of wound healing without infection will improve Outcome: Progressing   

## 2021-03-18 ENCOUNTER — Inpatient Hospital Stay (HOSPITAL_COMMUNITY)
Admit: 2021-03-18 | Discharge: 2021-03-18 | Disposition: A | Discharge status: Home / Self Care | Payer: BC Managed Care – PPO | Attending: Nurse Practitioner | Admitting: Nurse Practitioner

## 2021-03-18 ENCOUNTER — Encounter
Admission: EM | Disposition: A | Discharge status: Skilled Nursing Facility | Payer: Self-pay | Source: Home / Self Care | Attending: Internal Medicine

## 2021-03-18 ENCOUNTER — Inpatient Hospital Stay: Payer: Self-pay

## 2021-03-18 ENCOUNTER — Encounter: Payer: Self-pay | Admitting: Cardiovascular Disease

## 2021-03-18 DIAGNOSIS — R7881 Bacteremia: Secondary | ICD-10-CM

## 2021-03-18 DIAGNOSIS — I361 Nonrheumatic tricuspid (valve) insufficiency: Secondary | ICD-10-CM

## 2021-03-18 DIAGNOSIS — N179 Acute kidney failure, unspecified: Secondary | ICD-10-CM

## 2021-03-18 DIAGNOSIS — I340 Nonrheumatic mitral (valve) insufficiency: Secondary | ICD-10-CM

## 2021-03-18 DIAGNOSIS — A419 Sepsis, unspecified organism: Secondary | ICD-10-CM | POA: Diagnosis not present

## 2021-03-18 DIAGNOSIS — R6520 Severe sepsis without septic shock: Secondary | ICD-10-CM

## 2021-03-18 DIAGNOSIS — M86171 Other acute osteomyelitis, right ankle and foot: Secondary | ICD-10-CM

## 2021-03-18 HISTORY — PX: TEE WITHOUT CARDIOVERSION: SHX5443

## 2021-03-18 LAB — GLUCOSE, CAPILLARY
Glucose-Capillary: 175 mg/dL — ABNORMAL HIGH (ref 70–99)
Glucose-Capillary: 159 mg/dL — ABNORMAL HIGH (ref 70–99)
Glucose-Capillary: 205 mg/dL — ABNORMAL HIGH (ref 70–99)
Glucose-Capillary: 169 mg/dL — ABNORMAL HIGH (ref 70–99)

## 2021-03-18 SURGERY — ECHOCARDIOGRAM, TRANSESOPHAGEAL
Anesthesia: Choice

## 2021-03-18 MED ORDER — LIDOCAINE VISCOUS HCL 2 % MT SOLN
OROMUCOSAL | Status: AC
  Filled 2021-03-18: qty 15

## 2021-03-18 MED ORDER — MIDAZOLAM HCL 2 MG/2ML IJ SOLN
INTRAMUSCULAR | Status: AC | PRN
  Administered 2021-03-18 (×2): 1 mg via INTRAVENOUS

## 2021-03-18 MED ORDER — BUTAMBEN-TETRACAINE-BENZOCAINE 2-2-14 % EX AERO
INHALATION_SPRAY | CUTANEOUS | Status: AC
  Filled 2021-03-18: qty 5

## 2021-03-18 MED ORDER — FENTANYL CITRATE (PF) 100 MCG/2ML IJ SOLN
INTRAMUSCULAR | Status: AC | PRN
  Administered 2021-03-18 (×2): 25 ug via INTRAVENOUS

## 2021-03-18 MED ORDER — FENTANYL CITRATE (PF) 100 MCG/2ML IJ SOLN
INTRAMUSCULAR | Status: AC
  Filled 2021-03-18: qty 2

## 2021-03-18 MED ORDER — MIDAZOLAM HCL 2 MG/2ML IJ SOLN
INTRAMUSCULAR | Status: AC
  Filled 2021-03-18: qty 4

## 2021-03-18 NOTE — Plan of Care (Signed)
°  Problem: Education: Goal: Knowledge of General Education information will improve Description: Including pain rating scale, medication(s)/side effects and non-pharmacologic comfort measures Outcome: Progressing   Problem: Health Behavior/Discharge Planning: Goal: Ability to manage health-related needs will improve Outcome: Progressing   Problem: Clinical Measurements: Goal: Ability to maintain clinical measurements within normal limits will improve Outcome: Progressing Goal: Will remain free from infection Outcome: Progressing Goal: Diagnostic test results will improve Outcome: Progressing Goal: Respiratory complications will improve Outcome: Progressing Goal: Cardiovascular complication will be avoided Outcome: Progressing   Problem: Activity: Goal: Risk for activity intolerance will decrease Outcome: Progressing   Problem: Nutrition: Goal: Adequate nutrition will be maintained Outcome: Progressing   Problem: Coping: Goal: Level of anxiety will decrease Outcome: Progressing   Problem: Elimination: Goal: Will not experience complications related to bowel motility Outcome: Progressing Goal: Will not experience complications related to urinary retention Outcome: Progressing   Problem: Pain Managment: Goal: General experience of comfort will improve Outcome: Progressing   Problem: Safety: Goal: Ability to remain free from injury will improve Outcome: Progressing   Problem: Skin Integrity: Goal: Risk for impaired skin integrity will decrease Outcome: Progressing   Problem: Education: Goal: Ability to describe self-care measures that may prevent or decrease complications (Diabetes Survival Skills Education) will improve Outcome: Progressing Goal: Individualized Educational Video(s) Outcome: Progressing   Problem: Coping: Goal: Ability to adjust to condition or change in health will improve Outcome: Progressing   Problem: Fluid Volume: Goal: Ability to  maintain a balanced intake and output will improve Outcome: Progressing   Problem: Health Behavior/Discharge Planning: Goal: Ability to identify and utilize available resources and services will improve Outcome: Progressing Goal: Ability to manage health-related needs will improve Outcome: Progressing   Problem: Metabolic: Goal: Ability to maintain appropriate glucose levels will improve Outcome: Progressing   Problem: Nutritional: Goal: Maintenance of adequate nutrition will improve Outcome: Progressing Goal: Progress toward achieving an optimal weight will improve Outcome: Progressing   Problem: Skin Integrity: Goal: Risk for impaired skin integrity will decrease Outcome: Progressing   Problem: Tissue Perfusion: Goal: Adequacy of tissue perfusion will improve Outcome: Progressing   Problem: Education: Goal: Knowledge of the prescribed therapeutic regimen will improve Outcome: Progressing Goal: Ability to verbalize activity precautions or restrictions will improve Outcome: Progressing Goal: Understanding of discharge needs will improve Outcome: Progressing   Problem: Activity: Goal: Ability to perform//tolerate increased activity and mobilize with assistive devices will improve Outcome: Progressing   Problem: Clinical Measurements: Goal: Postoperative complications will be avoided or minimized Outcome: Progressing   Problem: Self-Care: Goal: Ability to meet self-care needs will improve Outcome: Progressing   Problem: Self-Concept: Goal: Ability to maintain and perform role responsibilities to the fullest extent possible will improve Outcome: Progressing   Problem: Pain Management: Goal: Pain level will decrease with appropriate interventions Outcome: Progressing   Problem: Skin Integrity: Goal: Demonstration of wound healing without infection will improve Outcome: Progressing   

## 2021-03-18 NOTE — Progress Notes (Signed)
Physical Therapy Treatment Patient Details Name: Rebecca Park MRN: [id number] DOB: 06-29-1960 Today's Date: 03/18/2021   History of Present Illness Rebecca Park is a 65yoF who comes [facility] on [facility]13[facility]21 c N/V, hyperglycemia. Pt missed her OP FU with podiatry due to feeling ill for days. 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 Julianne Rice.    PT Comments    Pt in bed on arrival, no nausea, reports to feel fairly good. Lunch partially eaten. Pt agreeable to session. Pt able to come to EOB without assistance, requires some time and assistance. Pt able to sit balanced at EOB and perform bilat leg exercises/ROM without LOB. Pt unable to rise to standing despite multiple attempts, surface height accomodation, DME, and physical assistance. Pt unable to safely perform pivot transfers at this time, hence even with a WC, would not be able to manage her own ADL at home. Recs updated to DC to higher level of assistance and higher frequency level at DC to improve strength and independence for safer eventual return to home.     Recommendations for follow up therapy are one component of a multi-disciplinary discharge planning process, led by the attending physician.  Recommendations may be updated based on patient status, additional functional criteria and insurance authorization.  Follow Up Recommendations  PT at Long-term acute care hospital (will need IV ABX and needs higher frequency PT and higher level assistance than can be met at home)     Assistance Recommended at Discharge    Equipment Recommendations  Wheelchair (measurements PT);Wheelchair cushion (measurements PT) (leg rest)    Recommendations for Other Services       Precautions / Restrictions Precautions Precautions:  Fall Precaution Comments: new LLE BKA Restrictions Weight Bearing Restrictions: Yes LLE Weight Bearing: Non weight bearing     Mobility  Bed Mobility Overal bed mobility: Needs Assistance Bed Mobility: Supine to Sit     Supine to sit: Supervision          Transfers Overall transfer level: Needs assistance Equipment used: Rolling walker (2 wheels) Transfers: Sit to/from Stand Sit to Stand: Max assist           General transfer comment: unsuccessful 3 attempts from progressively high EOB; then modA from elevated EOB twice, unable to achieve full upright, unable to remain in partial stance for >3 sec due to Rt knee buckle.    Ambulation/Gait Ambulation/Gait assistance:  (unable, too weak to maintain standing)                 Stairs             Wheelchair Mobility    Modified Rankin (Stroke Patients Only)       Balance     Sitting balance-Leahy Scale: Good     Standing balance support: Bilateral upper extremity supported Standing balance-Leahy Scale: Zero                              Cognition Arousal/Alertness: Awake/alert Behavior During Therapy: WFL for tasks assessed/performed Overall Cognitive Status: Within Functional Limits for tasks assessed  Exercises Amputee Exercises Knee Extension: AROM;Left;10 reps;Seated    General Comments        Pertinent Vitals/Pain Pain Assessment: No/denies pain    Home Living                          Prior Function            PT Goals (current goals can now be found in the care plan section) Acute Rehab PT Goals Patient Stated Goal: return to home, maximize healing potential to allow for future prosthesis use PT Goal Formulation: With patient Time For Goal Achievement: 03/30/21 Potential to Achieve Goals: Fair Progress towards PT goals: Progressing toward goals    Frequency    7X/week      PT Plan  Discharge plan needs to be updated    Co-evaluation              AM-PAC PT "6 Clicks" Mobility   Outcome Measure  Help needed turning from your back to your side while in a flat bed without using bedrails?: A Little Help needed moving from lying on your back to sitting on the side of a flat bed without using bedrails?: A Little Help needed moving to and from a bed to a chair (including a wheelchair)?: Total Help needed standing up from a chair using your arms (e.g., wheelchair or bedside chair)?: Total Help needed to walk in hospital room?: Total Help needed climbing 3-5 steps with a railing? : Total 6 Click Score: 10    End of Session Equipment Utilized During Treatment: Gait belt Activity Tolerance: Patient tolerated treatment well Patient left: in bed;with call bell/phone within reach;with family/visitor present Nurse Communication: Mobility status PT Visit Diagnosis: Difficulty in walking, not elsewhere classified (R26.2);Other abnormalities of gait and mobility (R26.89)     Time: 3382-5053 PT Time Calculation (min) (ACUTE ONLY): 18 min  Charges:  $Therapeutic Exercise: 8-22 mins                    3:26 PM, 03/18/21 Etta Grandchild, PT, DPT Physical Therapist - [facility]  [phone] ([place])    Lynn C 03/18/2021, 3:19 PM

## 2021-03-18 NOTE — Sedation Documentation (Signed)
Fluids down to 29mL /hr

## 2021-03-18 NOTE — Progress Notes (Signed)
Transesophageal Echocardiogram :  Indication: bacteremia, MSSA, r/o endocarditis Requesting/ordering  physician: Dr. Delaine Lame  Procedure: Benzocaine spray x2 and 2 mls x 2 of viscous lidocaine were given orally to provide local anesthesia to the oropharynx. The patient was positioned supine on the left side, bite block provided. The patient was moderately sedated with the doses of versed and fentanyl as detailed below.  Using digital technique an omniplane probe was advanced into the distal esophagus without incident.   Moderate sedation: 1. Sedation used:  Versed: 2 mg , Fentanyl: 50 ug 2. Time administered:   7:45 AM  Time when patient started recovery: 8:15 AM Total sedation time 30 min 3. 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  .  Estimated ejection fraction was 50%.  Right sided cardiac chambers were normal with no evidence of pulmonary hypertension.  Imaging of the septum showed no ASD or VSD Bubble study was negative for shunt 2D and color flow confirmed no PFO  The LA was well visualized in orthogonal views.  There was no spontaneous contrast and no thrombus in the LA and LA appendage   The descending thoracic aorta had no  mural aortic debris with no evidence of aneurysmal dilation or disection  Mild descending aorta and aortic arch atherosclerosis   Rebecca Park 03/18/2021 8:45 AM

## 2021-03-18 NOTE — Progress Notes (Signed)
PICC order received. Spoke with floor RN, PICC will be placed tomorrow 12/22. Patient has 2 working PIV.

## 2021-03-18 NOTE — Progress Notes (Signed)
Verbal order from Dr. Rockey Situ to change name on consent to Dr. Ida Rogue. Unable to modify order as it is already "completed".

## 2021-03-18 NOTE — Hospital Course (Signed)
60 year old white female DM TY 2 on glipizide, HTN Charcot foot with multiple prior amputations-4 months medial malleoli infection follows with Dr. Luana Shu of podiatry  Came to ED with nausea vomiting hyperglycemia--high-grade fevers)-prior to admission without much to eat or drink vomited multiple times-had not changed in her dressing for 10 days as was supposed to podiatrist Found to have white count of 18.1, lactic acid 3.1 and was tachycardic meeting concerned for sepsis AKI with creatinine 2.1-cefepime vancomycin started  Vascular was consulted Podiatry was consulted  Underwent BKA 1218 found to also have staff aureus bacteremia  Infectious disease outpatient plastic cardiology and patient underwent TEE

## 2021-03-18 NOTE — Progress Notes (Signed)
*  PRELIMINARY RESULTS* Echocardiogram Echocardiogram Transesophageal has been performed.  Rebecca Park Rebecca Park 03/18/2021, 8:27 AM

## 2021-03-18 NOTE — Progress Notes (Signed)
° °  Date of Admission:  03/13/2021     ID: Rebecca Park is a 60 y.o. female Active Problems:   Osteomyelitis ([place])    Subjective: Pt had TEE today Feeling fine No nausea Ate lunch No fever No pain   Medications:   atorvastatin  40 mg Oral Daily   butamben-tetracaine-benzocaine       enoxaparin (LOVENOX) injection  0.5 mg/kg Subcutaneous Q24H   fentaNYL       insulin aspart  0-20 Units Subcutaneous TID AC & HS   insulin glargine-yfgn  8 Units Subcutaneous BID   insulin starter kit- pen needles  1 kit Other Once   lidocaine       metoCLOPramide (REGLAN) injection  10 mg Intravenous Q8H   midazolam       multivitamin with minerals   Oral Daily   pantoprazole (PROTONIX) IV  40 mg Intravenous Q12H   scopolamine  1 patch Transdermal Q72H    Objective: Vital signs in last 24 hours: Temp:  [97.8 F (36.6 C)-99.8 F (37.7 C)] 98 F (36.7 C) (12/21 1115) Pulse Rate:  [82-105] 90 (12/21 1115) Resp:  [16-25] 17 (12/21 1115) BP: (87-123)/(39-73) 117/65 (12/21 1115) SpO2:  [87 %-98 %] 95 % (12/21 1115)  PHYSICAL EXAM:  General: Alert, cooperative, no distress, appears stated age.  Head: Normocephalic, without obvious abnormality, atraumatic. Eyes: Conjunctivae clear, anicteric sclerae. Pupils are equal ENT Nares normal. No drainage or sinus tenderness. Lips, mucosa, and tongue normal. No Thrush Neck: Supple, symmetrical, no adenopathy, thyroid: non tender no carotid bruit and no JVD. Back: No CVA tenderness. Lungs: Clear to auscultation bilaterally. No Wheezing or Rhonchi. No rales. Heart: Regular rate and rhythm, no murmur, rub or gallop. Abdomen: Soft, non-tender,not distended. Bowel sounds normal. No masses Extremities: Left BKA surgical site covered with dressing skin: No rashes or lesions. Or bruising Lymph: Cervical, supraclavicular normal. Neurologic: Grossly non-focal  Lab Results Recent Labs    03/16/21 0452 03/17/21 0429  WBC  --  16.2*  HGB  --  9.5*   HCT  --  29.8*  NA 133* 138  K 3.7 3.6  CL 105 105  CO2 23 27  BUN 29* 22*  CREATININE 1.34* 0.96   Liver Panel No results for input(s): PROT, ALBUMIN, AST, ALT, ALKPHOS, BILITOT, BILIDIR, IBILI in the last 72 hours.   Microbiology:03/13/2021 blood culture MSSA 03/15/2021 blood culture no growth so far 03/13/2021 surgical wound culture MSSA   Studies/Results: [place] EKG SITE RITE  Result Date: 03/18/2021 If Site Rite image not attached, placement could not be confirmed due to current cardiac rhythm.    Assessment/Plan: ? Diabetic foot infection of the left with sepsis  Charcot arthropathy. Medial malleolus ulcer with underlying osteomyelitis and septic arthritis. Status post BKA  MSSA bacteremia secondary to the above. Antibiotic has been de-escalated from Vanco and cefepime to cefazolin.  repeat blood cultures no growth so far 2D echo done.  TEE questions Lambl excrescence VS small vegetation on the aortic valve .Discussed with Dr.Gollan, as cannot say for sure will do cefazolin for 4 weeks   Diabetes mellitus poorly controlled.  Now on insulin  AKI.  Resolved   Anemia.  Discussed the management with the patient and her spouse I will follow her as OP

## 2021-03-18 NOTE — Progress Notes (Signed)
Inpatient Diabetes Program Recommendations  AACE/ADA: New Consensus Statement on Inpatient Glycemic Control   Target Ranges:  Prepandial:   less than 140 mg/dL      Peak postprandial:   less than 180 mg/dL (1-2 hours)      Critically ill patients:  140 - 180 mg/dL    Latest Reference Range & Units 03/17/21 07:51 03/17/21 12:18 03/17/21 16:31 03/17/21 20:57 03/18/21 07:33 03/18/21 11:12  Glucose-Capillary 70 - 99 mg/dL 127 (H) 170 (H) 188 (H) 180 (H) 175 (H) 159 (H)    Latest Reference Range & Units 03/14/21 04:43  Hemoglobin A1C 4.8 - 5.6 % 10.5 (H)   Review of Glycemic Control  Diabetes history: DM2 Outpatient Diabetes medications: Glipzide 10 mg BID, Trulicity 1.5 mg Qweek Current orders for Inpatient glycemic control: Semglee 8 units BID, Novolog 0-20 units TID with meals and bedtime   Inpatient Diabetes Program Recommendations:     HbgA1C: A1C 10.5% on 03/14/21 indicating an average glucose of 255 mg/dl over the past 2-3 months.  NOTE: Inpatient diabetes coordinator spoke with patient on 12/19 and 12/20 regarding DM control, reviewed insulin pens, and discussed glucose monitoring. Patient interested in using FreeStlye Libre2 CGM sensors; given order to provide FreeStyle Libre2 CGM sensors by Dr. Priscella Mann on 03/16/21. Patient may be discharging to rehab so will not apply FreeStyle Libre CGM as it would likely need to be removed if discharging to rehab facility. Reviewed how FreeStyle Mike Craze is inserted (step by step), how is works, Forensic psychologist on phone to use with sensor, and about patient's PCP prescribing it for her after discharge. Provided patient with 3 FreeStyle Libre2 CGM sensors and her wife will take them home for patient to use after she is discharged home. Encouraged patient to review step by step directions on getting started pamphlet and also encouraged patient to watch youtube video with step by step directions as well prior to applying the first sensor. Patient  verbalized understanding and appreciative of getting the samples.  Thanks, Barnie Alderman, RN, MSN, CDE Diabetes Coordinator Inpatient Diabetes Program [phone] (Team Pager from 8am to 5pm)

## 2021-03-18 NOTE — Progress Notes (Signed)
PROGRESS NOTE   Rebecca Park  ZOX:[id number] DOB: 06-10-1960 DOA: 03/13/2021 PCP: Marinda Elk, MD  Brief Narrative:  60 year old white female DM TY 2 on glipizide, HTN Charcot foot with multiple prior amputations-4 months medial malleoli infection follows with Dr. Luana Shu of podiatry  Came to ED with nausea vomiting hyperglycemia--high-grade fevers)-prior to admission without much to eat or drink vomited multiple times-had not changed medial malleolar dressing for 10 days as was supposed to podiatrist Found to have white count of 18.1, lactic acid 3.1 and was tachycardic meeting concern for sepsis AKI with creatinine 2.1-cefepime vancomycin started  Podiatry was consulted but deferred to vascular and signed off Vascular was consulted felt that she should have amputation as no viable options for repair of leg   Underwent BKA 12/18 found to also have staff aureus bacteremia  Infectious disease , orthopedics , cardiology consulted and patient underwent TEE which did not show clear-cut evidence of vegetation  Hospital-Problem based course  MSSA bacteremia ?  Small vegetation on TEE 12/21 Medial malleoli wound infection BKA completed 12/18 Needs cefazolin 2 g every 8 ending 1/18 and follow-up with Dr. Steva Ready outpatient Repeat a.m. labs DM TY 2 neuropathy Charcot foot CBGs 1 59-1 75 Continue Semglee 8 units twice daily with resistant sliding scale 10 twice daily on discharge as well as Trulicity once weekly [not on insulin at home] HTN Losartan from prior to admission 100 held, resumed amlodipine 5 Demadex has been held   DVT prophylaxis: Lovenox Code Status: Full Family Communication: Discussed with wife at bedside Disposition:  Status is: Inpatient  Remains inpatient appropriate because:   Await drop in white count and potential [place] planning for skilled placement  Consultants:  As above  Procedures: As above  Antimicrobials:  Cefazolin   Subjective: Awake coherent no distress eating and drinking no nausea Pain at worst is about 4-5/10 She has no fever no chills    Objective: Vitals:   03/18/21 0840 03/18/21 0850 03/18/21 0915 03/18/21 1115  BP: 104/60 109/61 91/73 117/65  Pulse: 85 83 89 90  Resp: (!) 25 (!) '24 17 17  ' Temp:   97.8 F (36.6 C) 98 F (36.7 C)  TempSrc:      SpO2: 94% 97% 98% 95%  Weight:      Height:        Intake/Output Summary (Last 24 hours) at 03/18/2021 1432 Last data filed at 03/18/2021 0030 Gross per 24 hour  Intake --  Output 400 ml  Net -400 ml   Filed Weights   03/13/21 2339  Weight: 104.3 kg    Examination:  EOMI NCAT thick neck Mallampati 4 CTA B no added sound no rales no rhonchi Abdomen obese nontender no rebound no guarding Left BKA noted in [place]-did not disturb dressing Neurologically intact moving 4 limbs equally Psych intact pleasant congruent   Data Reviewed: personally reviewed   CBC    Component Value Date/Time   WBC 16.2 (H) 03/17/2021 0429   RBC 3.47 (L) 03/17/2021 0429   HGB 9.5 (L) 03/17/2021 0429   HCT 29.8 (L) 03/17/2021 0429   PLT 179 03/17/2021 0429   MCV 85.9 03/17/2021 0429   MCH 27.4 03/17/2021 0429   MCHC 31.9 03/17/2021 0429   RDW 15.1 03/17/2021 0429   LYMPHSABS 0.5 (L) 03/15/2021 0444   MONOABS 0.6 03/15/2021 0444   EOSABS 0.0 03/15/2021 0444   BASOSABS 0.0 03/15/2021 0444   CMP Latest Ref Rng & Units 03/17/2021 03/16/2021 03/15/2021  Glucose 70 -  99 mg/dL 124(H) 141(H) 275(H)  BUN 6 - 20 mg/dL 22(H) 29(H) 30(H)  Creatinine 0.44 - 1.00 mg/dL 0.96 1.34(H) 1.23(H)  Sodium 135 - 145 mmol/L 138 133(L) 130(L)  Potassium 3.5 - 5.1 mmol/L 3.6 3.7 3.7  Chloride 98 - 111 mmol/L 105 105 98  CO2 22 - 32 mmol/L '27 23 25  ' Calcium 8.9 - 10.3 mg/dL 8.2(L) 8.0(L) 8.8(L)  Total Protein 6.5 - 8.1 g/dL - - 5.9(L)  Total Bilirubin 0.3 - 1.2 mg/dL - - 1.6(H)  Alkaline Phos 38 - 126 U/L - - 148(H)  AST 15 - 41 U/L - - 21  ALT 0 -  44 U/L - - 18     Radiology Studies: [place] EKG SITE RITE  Result Date: 03/18/2021 If Site Rite image not attached, placement could not be confirmed due to current cardiac rhythm.    Scheduled Meds:  atorvastatin  40 mg Oral Daily   butamben-tetracaine-benzocaine       enoxaparin (LOVENOX) injection  0.5 mg/kg Subcutaneous Q24H   fentaNYL       insulin aspart  0-20 Units Subcutaneous TID AC & HS   insulin glargine-yfgn  8 Units Subcutaneous BID   insulin starter kit- pen needles  1 kit Other Once   lidocaine       metoCLOPramide (REGLAN) injection  10 mg Intravenous Q8H   midazolam       multivitamin with minerals   Oral Daily   pantoprazole (PROTONIX) IV  40 mg Intravenous Q12H   scopolamine  1 patch Transdermal Q72H   Continuous Infusions:   ceFAZolin (ANCEF) IV 2 g (03/18/21 1255)   promethazine (PHENERGAN) injection (IM or IVPB) 12.5 mg (03/16/21 1626)     LOS: 4 days   Time spent: 67  Nita Sells, MD [facility] To contact the attending provider between 7A-7P or the covering provider during after hours 7P-7A, please log into the web site [email or web address] and access using universal [place] password for that web site. If you do not have the password, please call the hospital operator.  03/18/2021, 2:32 PM

## 2021-03-18 NOTE — Plan of Care (Signed)

## 2021-03-18 NOTE — Treatment Plan (Addendum)
Diagnosis: MSSA bacteremia and foot infection ? Lambls excrescence VS aortic valve small vegetation Baseline Creatinine <1   No Known Allergies  OPAT Orders Discharge antibiotics: Cefazolin 2 grams IV every 8hours Duration: 4 weeks End date  04/15/20   [facility] Care Per Protocol:  Labs weekly on Tuesday while on IV antibiotics: X__ CBC with differential  __X CMP  _X_ ESR   _X_ Please pull PIC at completion of IV antibiotics _  Fax weekly labs to [phone]  Clinic Follow Up Appt: 04/09/21 at 11.15 Am with Dr.Rashid [place]   Call [phone] for any critical values or questions

## 2021-03-18 NOTE — TOC Progression Note (Signed)
Transition of Care [facility]) - Progression Note    Patient Details  Name: ARVIS MIGUEZ MRN: [id number] Date of Birth: May 27, 1960  Transition of Care [facility]) CM/SW [place], RN Phone Number: 03/18/2021, 3:06 PM  Clinical Narrative:   Met with the patient and her wife in the room, We discussed going to LTAC compared to [place] SNF, I provided her choice between the [place] facilities She would like to go to [place], I called Jenn at [facility] we discussed and she will review, and and attempt to get ins approval, she will let me know once they hear from ins   Expected Discharge Plan: [place] Barriers to Discharge: Continued Medical Work up  Expected Discharge Plan and Services Expected Discharge Plan: [place]   Discharge Planning Services: CM Consult   Living arrangements for the past 2 months: Single Family Home                 DME Arranged: Youth worker wheelchair with seat cushion, 3-N-1 DME Agency: [employer] Date DME Agency Contacted: 03/17/21 Time DME Agency Contacted: 45 Representative spoke with at DME Agency: rhonda HH Arranged: PT, OT [place] Agency: [facility] Date [place]: 03/17/21 Time [place]: [place] Representative spoke with at [place]: [place] ([place]) Interventions    Readmission Risk Interventions No flowsheet data found.

## 2021-03-18 NOTE — Progress Notes (Signed)
Occupational Therapy Treatment Patient Details Name: Rebecca Park MRN: [id number] DOB: 1960/10/07 Today's Date: 03/18/2021   History of present illness Cinde Deguire is a 68yoF who [facility] c N/V, hyperglycemia. Pt missed her OP FU with podiatry due to feeling ill for days. 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 Julianne Rice.   OT comments  Upon entering the room, pt supine in bed with family present in the room. Pt reports fatigue from having just worked with physical therapy. She requests not to have to attempt OOB/transfers this session. Pt agreeable to B UE strengthening with use of red, level 2 theraband. Pt performing 2 sets of 10 chest pulls, shoulder diagonals, shoulder elevation, alternating punches, and bicep curls with min cuing for proper technique. Pt needing rest breaks secondary to fatigue. OT did also speak with pt regarding phantom leg pan and she reports not having many occurences. OT did recommending to continue to work on lightly rubbing and tapping residual limb for feedback and pain management. Pt verbalized understanding and is very pleasant throughout session. All needs within reach. OT recommends short term rehab at discharge to address functional deficits before returning home.    Recommendations for follow up therapy are one component of a multi-disciplinary discharge planning process, led by the attending physician.  Recommendations may be updated based on patient status, additional functional criteria and insurance authorization.    Follow Up Recommendations  Skilled nursing-short term rehab (<3 hours/day)    Assistance Recommended at Discharge Intermittent Supervision/Assistance  Equipment Recommendations  Wheelchair (measurements  OT);Wheelchair cushion (measurements OT);Other (comment) (drop arm commode chair)       Precautions / Restrictions Precautions Precautions: Fall Precaution Comments: new LLE BKA Restrictions Weight Bearing Restrictions: Yes LLE Weight Bearing: Non weight bearing              ADL either performed or assessed with clinical judgement    Extremity/Trunk Assessment Upper Extremity Assessment Upper Extremity Assessment: Generalized weakness   Lower Extremity Assessment Lower Extremity Assessment: Generalized weakness        Vision Patient Visual Report: No change from baseline            Cognition Arousal/Alertness: Awake/alert Behavior During Therapy: WFL for tasks assessed/performed Overall Cognitive Status: Within Functional Limits for tasks assessed                                                       Pertinent Vitals/ Pain       Pain Assessment: No/denies pain         Frequency  Min 3X/week        Progress Toward Goals  OT Goals(current goals can now be found in the care plan section)  Progress towards OT goals: Progressing toward goals  Acute Rehab OT Goals Patient Stated Goal: to get stronger OT Goal Formulation: With patient/family Time For Goal Achievement: 03/31/21 Potential to Achieve Goals: Good  Plan Discharge plan remains appropriate;Frequency remains appropriate       AM-PAC OT "6 Clicks" Daily Activity     Outcome Measure   Help from  another person eating meals?: None Help from another person taking care of personal grooming?: None Help from another person toileting, which includes using toliet, bedpan, or urinal?: A Lot Help from another person bathing (including washing, rinsing, drying)?: A Lot Help from another person to put on and taking off regular upper body clothing?: None Help from another person to put on and taking off regular lower body clothing?: A Lot 6 Click Score: 18    End of Session     OT Visit Diagnosis: Other abnormalities of gait and mobility (R26.89);Muscle weakness (generalized) (M62.81)   Activity Tolerance Patient tolerated treatment well   Patient Left in bed;with call bell/phone within reach;with bed alarm set;with family/visitor present   Nurse Communication Mobility status        Time: 5465-6812 OT Time Calculation (min): 26 min  Charges: OT General Charges $OT Visit: 1 Visit OT Treatments $Therapeutic Exercise: 23-37 mins Darleen Crocker, MS, OTR/L , CBIS ascom [phone]  03/18/21, 3:06 PM

## 2021-03-18 NOTE — Progress Notes (Signed)
PHARMACY CONSULT NOTE FOR:  OUTPATIENT  PARENTERAL ANTIBIOTIC THERAPY (OPAT)  Indication: MSSA bacteremia and foot infection ? Lambls excrescence VS aortic valve small vegetation Regimen: cefazolin 2gm IV q8h End date: 04/15/2021  IV antibiotic discharge orders are pended. To discharging provider:  please sign these orders via discharge navigator,  Select New Orders & click on the button choice - Manage This Unsigned Work.     Thank you for allowing pharmacy to be a part of this patient's care.  Doreene Eland, PharmD, BCPS, BCIDP Work Cell: [phone] 03/18/2021 1:29 PM

## 2021-03-18 NOTE — Sedation Documentation (Signed)
Fluids increased to 563mL/hr

## 2021-03-19 DIAGNOSIS — A419 Sepsis, unspecified organism: Secondary | ICD-10-CM | POA: Diagnosis not present

## 2021-03-19 DIAGNOSIS — N179 Acute kidney failure, unspecified: Secondary | ICD-10-CM | POA: Diagnosis not present

## 2021-03-19 DIAGNOSIS — R6520 Severe sepsis without septic shock: Secondary | ICD-10-CM | POA: Diagnosis not present

## 2021-03-19 LAB — GLUCOSE, CAPILLARY
Glucose-Capillary: 188 mg/dL — ABNORMAL HIGH (ref 70–99)
Glucose-Capillary: 216 mg/dL — ABNORMAL HIGH (ref 70–99)
Glucose-Capillary: 217 mg/dL — ABNORMAL HIGH (ref 70–99)
Glucose-Capillary: 169 mg/dL — ABNORMAL HIGH (ref 70–99)

## 2021-03-19 LAB — AEROBIC/ANAEROBIC CULTURE W GRAM STAIN (SURGICAL/DEEP WOUND): Gram Stain: NONE SEEN

## 2021-03-19 MED ORDER — DOCUSATE SODIUM 100 MG PO CAPS
100.0000 mg | ORAL_CAPSULE | Freq: Two times a day (BID) | ORAL | Status: DC
  Administered 2021-03-19 – 2021-03-21 (×4): 100 mg via ORAL
  Filled 2021-03-19 (×4): qty 1

## 2021-03-19 MED ORDER — CHLORHEXIDINE GLUCONATE CLOTH 2 % EX PADS
6.0000 | MEDICATED_PAD | Freq: Every day | CUTANEOUS | Status: DC
  Administered 2021-03-19 – 2021-03-27 (×9): 6 via TOPICAL

## 2021-03-19 MED ORDER — SODIUM CHLORIDE 0.9% FLUSH
10.0000 mL | Freq: Two times a day (BID) | INTRAVENOUS | Status: DC
  Administered 2021-03-19 – 2021-03-26 (×13): 10 mL
  Administered 2021-03-27: 09:00:00 20 mL

## 2021-03-19 MED ORDER — SENNA 8.6 MG PO TABS
1.0000 | ORAL_TABLET | Freq: Every day | ORAL | Status: DC
  Administered 2021-03-19 – 2021-03-25 (×4): 8.6 mg via ORAL
  Filled 2021-03-19 (×8): qty 1

## 2021-03-19 MED ORDER — SODIUM CHLORIDE 0.9% FLUSH: 10.0000 mL | INTRAVENOUS | Status: DC | PRN

## 2021-03-19 MED ORDER — BISACODYL 10 MG RE SUPP
10.0000 mg | Freq: Once | RECTAL | Status: AC
  Administered 2021-03-19: 21:00:00 10 mg via RECTAL
  Filled 2021-03-19: qty 1

## 2021-03-19 NOTE — Progress Notes (Signed)
Peripherally Inserted Central Catheter Placement  The IV Nurse has discussed with the patient and/or persons authorized to consent for the patient, the purpose of this procedure and the potential benefits and risks involved with this procedure.  The benefits include less needle sticks, lab draws from the catheter, and the patient may be discharged home with the catheter. Risks include, but not limited to, infection, bleeding, blood clot (thrombus formation), and puncture of an artery; nerve damage and irregular heartbeat and possibility to perform a PICC exchange if needed/ordered by physician.  Alternatives to this procedure were also discussed.  Bard Power PICC patient education guide, fact sheet on infection prevention and patient information card has been provided to patient /or left at bedside.    PICC Placement Documentation  PICC Single Lumen 49/20/10 Right Basilic 40 cm 0 cm (Active)  Indication for Insertion or Continuance of Line Home intravenous therapies (PICC only) 03/19/21 1100  Exposed Catheter (cm) 0 cm 03/19/21 1100  Site Assessment Clean;Dry;Intact 03/19/21 1100  Line Status Flushed;Saline locked;Blood return noted 03/19/21 1100  Dressing Type Transparent 03/19/21 1100  Dressing Status Clean;Dry;Intact 03/19/21 1100  Antimicrobial disc in place? Yes 03/19/21 1100  Safety Lock Not Applicable 10/07/17 7588  Line Care Connections checked and tightened 03/19/21 1100  Line Adjustment (NICU/IV Team Only) No 03/19/21 1100  Dressing Intervention New dressing 03/19/21 1100  Dressing Change Due 03/26/21 03/19/21 1100       Rolena Infante 03/19/2021, 11:25 AM

## 2021-03-19 NOTE — Progress Notes (Signed)
Adeyemi LPN notifiedthat PICC is ready to use.and to remove the PIV, change the tubing.

## 2021-03-19 NOTE — Plan of Care (Signed)

## 2021-03-19 NOTE — Progress Notes (Signed)
Occupational Therapy Treatment Patient Details Name: Rebecca Park MRN: [id number] DOB: 12-05-1960 Today's Date: 03/19/2021   History of present illness Rebecca Park is a 62yoF who comes to [facility] on 03/13/21 c N/V, hyperglycemia. Pt missed her OP FU with podiatry due to feeling ill for days. 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 Julianne Rice.   OT comments  Pt seen for OT tx, spouse present. Pt set up for seated sponge bath, requiring only MIN A for washing her back and MIN A for LE bathing and sock mgt. Afterwards pt noting ~4/10 PRE for activity. VSS. Pt/spouse educated in AE for LB ADL to improve independence and safety. RLE and both hands noted to be edematous. Pt/spouse noting it has worsened and was not present prior to admission. RN notified. Pt progressing towards goals, continues to benefit from skilled OT Services. Recommend additional f/u OT services at discharge.    Recommendations for follow up therapy are one component of a multi-disciplinary discharge planning process, led by the attending physician.  Recommendations may be updated based on patient status, additional functional criteria and insurance authorization.    Follow Up Recommendations  Follow physician's recommendations for discharge plan and follow up therapies    Assistance Recommended at Discharge Intermittent Supervision/Assistance  Equipment Recommendations  Wheelchair (measurements OT);Wheelchair cushion (measurements OT);Other (comment) (drop arm BSC)    Recommendations for Other Services      Precautions / Restrictions Precautions Precautions: Fall Precaution Comments: new LLE BKA Restrictions Weight Bearing Restrictions: Yes LLE Weight Bearing: Non weight bearing       Mobility Bed  Mobility               General bed mobility comments: NT, up in recliner    Transfers                         Balance Overall balance assessment: Needs assistance Sitting-balance support: No upper extremity supported;Feet supported;Feet unsupported Sitting balance-Leahy Scale: Good                                     ADL either performed or assessed with clinical judgement   ADL Overall ADL's : Needs assistance/impaired         Upper Body Bathing: Sitting;Set up;Minimal assistance Upper Body Bathing Details (indicate cue type and reason): Min A only for washing back Lower Body Bathing: Sitting/lateral leans;Minimal assistance   Upper Body Dressing : Sitting;Set up   Lower Body Dressing: Sitting/lateral leans;Maximal assistance Lower Body Dressing Details (indicate cue type and reason): MAX A to don sock over edematous RLE                    Extremity/Trunk Assessment Upper Extremity Assessment Upper Extremity Assessment:  (B hands very edematous, pitting +1-2)   Lower Extremity Assessment Lower Extremity Assessment:  (RLE edematous)        Vision       Perception     Praxis      Cognition Arousal/Alertness: Awake/alert Behavior During Therapy: WFL for tasks assessed/performed Overall Cognitive Status: Within Functional Limits for tasks assessed  Exercises Other Exercises Other Exercises: Pt/spouse instructed in seated modifications/AE for ADL tasks Other Exercises: Pt/spouse instructed in stretches to perform in recliner to support improved hamstring stretch   Shoulder Instructions       General Comments      Pertinent Vitals/ Pain       Pain Assessment: No/denies pain  Home Living                                          Prior Functioning/Environment              Frequency  Min 3X/week        Progress Toward  Goals  OT Goals(current goals can now be found in the care plan section)  Progress towards OT goals: Progressing toward goals  Acute Rehab OT Goals Patient Stated Goal: to get stronger OT Goal Formulation: With patient/family Time For Goal Achievement: 03/31/21 Potential to Achieve Goals: Good  Plan Discharge plan remains appropriate;Frequency remains appropriate    Co-evaluation                 AM-PAC OT "6 Clicks" Daily Activity     Outcome Measure   Help from another person eating meals?: None Help from another person taking care of personal grooming?: None Help from another person toileting, which includes using toliet, bedpan, or urinal?: A Lot Help from another person bathing (including washing, rinsing, drying)?: A Lot Help from another person to put on and taking off regular upper body clothing?: None Help from another person to put on and taking off regular lower body clothing?: A Lot 6 Click Score: 18    End of Session    OT Visit Diagnosis: Other abnormalities of gait and mobility (R26.89);Muscle weakness (generalized) (M62.81)   Activity Tolerance Patient tolerated treatment well   Patient Left in chair;with call bell/phone within reach;with family/visitor present;Other (comment)   Nurse Communication          Time: 5631-4970 OT Time Calculation (min): 32 min  Charges: OT General Charges $OT Visit: 1 Visit OT Treatments $Self Care/Home Management : 23-37 mins  Ardeth Perfect., MPH, MS, OTR/L ascom [phone] 03/19/21, 3:47 PM

## 2021-03-19 NOTE — Progress Notes (Signed)
Physical Therapy Treatment Patient Details Name: Rebecca Park MRN: [id number] DOB: 04-04-1960 Today's Date: 03/19/2021   History of Present Illness Rebecca Park is a 23yoF who comes to [facility] on 03/13/21 c N/V, hyperglycemia. Pt missed her OP FU with podiatry due to feeling ill for days. 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 Julianne Rice.    PT Comments    Pt's PT was broken into two sessions today. She is A and O x 4 and extremely cooperative and motivated throughout. She endorses slight R shoulder pain but overall pain did not limit session. She was easily and safely able to progress from supine to EOB short sitting. Performed several exercises EOB prior to lateral sideboard transfer to recliner. Has a little more difficulty later in the day to return to bed form recliner via sideboard but overall is progressing nicely. PT recommends continued skilled PT at DC to progress pt towards PLOF.    Recommendations for follow up therapy are one component of a multi-disciplinary discharge planning process, led by the attending physician.  Recommendations may be updated based on patient status, additional functional criteria and insurance authorization.  Follow Up Recommendations  PT at Long-term acute care hospital (pt will require long term abx and will be more cost effective at [facility])     Assistance Recommended at Discharge Set up Supervision/Assistance  Equipment Recommendations  Other (comment) (defer to next level of care)       Precautions / Restrictions Precautions Precautions: Fall Precaution Comments: new LLE BKA Restrictions Weight Bearing Restrictions: Yes LLE Weight Bearing: Non weight bearing     Mobility  Bed Mobility Overal bed mobility: Needs Assistance Bed  Mobility: Supine to Sit     Supine to sit: Supervision Sit to supine: Min guard   General bed mobility comments: pt was able to safely progress from supine to short sit EOB without physical lifting assistance. she did use bedrails and require slightly increased time. oce in sitting, no LOB at EOB    Transfers Overall transfer level: Needs assistance Equipment used: Sliding board Transfers: Bed to chair/wheelchair/BSC            Lateral/Scoot Transfers: Min guard General transfer comment: pt was easily able to go form EOB to recliner. struggles a little bit from recliner back to bed later in the day but overall did well.    Ambulation/Gait      General Gait Details: unable at this time      Balance Overall balance assessment: Needs assistance Sitting-balance support: No upper extremity supported;Feet supported Sitting balance-Leahy Scale: Good       Cognition Arousal/Alertness: Awake/alert Behavior During Therapy: WFL for tasks assessed/performed Overall Cognitive Status: Within Functional Limits for tasks assessed        General Comments: Pt is A and O x 4        Exercises Other Exercises Other Exercises: Pt/spouse instructed in seated modifications/AE for ADL tasks Other Exercises: Pt/spouse instructed in stretches to perform in recliner to support improved hamstring stretch        Pertinent Vitals/Pain Pain Assessment: No/denies pain     PT Goals (current goals can now be found in the care plan section) Acute Rehab PT Goals Patient Stated Goal: return to home, maximize healing potential  to allow for future prosthesis use Progress towards PT goals: Progressing toward goals    Frequency    7X/week      PT Plan Discharge plan needs to be updated       AM-PAC PT "6 Clicks" Mobility   Outcome Measure  Help needed turning from your back to your side while in a flat bed without using bedrails?: A Little Help needed moving from lying on your  back to sitting on the side of a flat bed without using bedrails?: A Little Help needed moving to and from a bed to a chair (including a wheelchair)?: A Little Help needed standing up from a chair using your arms (e.g., wheelchair or bedside chair)?: A Lot Help needed to walk in hospital room?: Total Help needed climbing 3-5 steps with a railing? : Total 6 Click Score: 13    End of Session   Activity Tolerance: Patient tolerated treatment well Patient left: in bed;with call bell/phone within reach;with family/visitor present Nurse Communication: Mobility status PT Visit Diagnosis: Difficulty in walking, not elsewhere classified (R26.2);Other abnormalities of gait and mobility (R26.89)     Time: 9735-3299 PT Time Calculation (min) (ACUTE ONLY): 39 min  Charges:  $Therapeutic Exercise: 8-22 mins $Therapeutic Activity: 23-37 mins                     Julaine Fusi PTA 03/19/21, 4:26 PM

## 2021-03-19 NOTE — Progress Notes (Signed)
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 Luana Shu of podiatry  Came to ED with nausea vomiting hyperglycemia--high-grade fevers)-prior to admission without much to eat or drink vomited multiple times-had not changed medial malleolar dressing for 10 days as was supposed to podiatrist Found to have white count of 18.1, lactic acid 3.1 and was tachycardic meeting concern for sepsis AKI with creatinine 2.1-cefepime vancomycin started  Podiatry was consulted but deferred to vascular and signed off Vascular was consulted felt that she should have amputation as no viable options for repair of leg   Underwent BKA 12/18 found to also have staff aureus bacteremia  Infectious disease , orthopedics , cardiology consulted and patient underwent TEE which did not show clear-cut evidence of vegetation  Hospital-Problem based course  MSSA bacteremia ?  Small vegetation on TEE 12/21 Medial malleoli wound infection status post BKA 12/18 pain is controlled, continue Percocet for moderate pain 1-2 tab Needs cefazolin 2 g every 8 ending 1/18 and follow-up with Dr. Steva Ready outpatient Repeat a.m. labs DM TY 2 neuropathy Charcot foot CBGs 160-190 Continue Semglee 8 units twice daily with resistant sliding scale 10 twice daily on discharge as well as Trulicity once weekly [not on insulin at home] HTN Losartan from prior to admission 100 held, resumed amlodipine 5 Demadex has been held Bicipital groove tendinosis Will ask OT to reeval  DVT prophylaxis: Lovenox Code Status: Full Family Communication: Discussed with wife at bedside Disposition:  Status is: Inpatient  Remains inpatient appropriate because:   Await drop in white count and potential [place] planning for  skilled placement  Consultants:  As above  Procedures: As above  Antimicrobials: Cefazolin   Subjective:  Well States both shoulders have pain  No other issue Mobilizing well with PT earlier today    Objective: Vitals:   03/18/21 1528 03/18/21 2146 03/19/21 0327 03/19/21 0805  BP: (!) 108/59 107/66 (!) 96/56 121/67  Pulse: 89 91 92 92  Resp: _0 Temp: 98.2 F (36.8 C) 98.5 F (36.9 C) 98.4 F (36.9 C) 98.2 F (36.8 C)  TempSrc:      SpO2: 95% 93% 93% 93%  Weight:      Height:        Intake/Output Summary (Last 24 hours) at 03/19/2021 1050 Last data filed at 03/19/2021 3888 Gross per 24 hour  Intake --  Output 1150 ml  Net -1150 ml    Filed Weights   03/13/21 2339  Weight: 104.3 kg    Examination:  Eomi ncat no focal deficit Tenderness in bicipital grove and point tenderness over bicep long tendon-Yergason and Speed test + Cta b no added sound no rales no rhonchi S1 s2 no m/r/g Abd soft nt nd no rebound no guard Neuro grossly intact ROM overall intact BKA on L side  Data Reviewed: personally reviewed   CBC    Component Value Date/Time   WBC 16.2 (H) 03/17/2021 0429   RBC 3.47 (L) 03/17/2021 0429   HGB 9.5 (L) 03/17/2021 0429   HCT 29.8 (L) 03/17/2021 0429   PLT 179 03/17/2021 0429   MCV 85.9 03/17/2021 0429   MCH 27.4 03/17/2021 0429   MCHC 31.9 03/17/2021 0429   RDW 15.1 03/17/2021 0429   LYMPHSABS 0.5 (L) 03/15/2021 0444   MONOABS  0.6 03/15/2021 0444   EOSABS 0.0 03/15/2021 0444   BASOSABS 0.0 03/15/2021 0444   CMP Latest Ref Rng & Units 03/17/2021 03/16/2021 03/15/2021  Glucose 70 - 99 mg/dL 124(H) 141(H) 275(H)  BUN 6 - 20 mg/dL 22(H) 29(H) 30(H)  Creatinine 0.44 - 1.00 mg/dL 0.96 1.34(H) 1.23(H)  Sodium 135 - 145 mmol/L 138 133(L) 130(L)  Potassium 3.5 - 5.1 mmol/L 3.6 3.7 3.7  Chloride 98 - 111 mmol/L 105 105 98  CO2 22 - 32 mmol/L _0 Calcium 8.9 - 10.3 mg/dL 8.2(L) 8.0(L) 8.8(L)  Total Protein 6.5 - 8.1 g/dL  - - 5.9(L)  Total Bilirubin 0.3 - 1.2 mg/dL - - 1.6(H)  Alkaline Phos 38 - 126 U/L - - 148(H)  AST 15 - 41 U/L - - 21  ALT 0 - 44 U/L - - 18     Radiology Studies: ECHO TEE  Result Date: 03/18/2021    TRANSESOPHOGEAL ECHO REPORT   Patient Name:   Rebecca Park Date of Exam: 03/18/2021 Medical Rec #:  [id number]      Height:       66.0 in Accession #:    [id number]     Weight:       230.0 lb Date of Birth:  1961/03/05       BSA:          2.122 m Patient Age:    82 years       BP:           113/62 mmHg Patient Gender: F              HR:           93 bpm. Exam Location:  [facility] Procedure: Transesophageal Echo, Color Doppler and Saline Contrast Bubble Study Indications:     Bacteremia  History:         Patient has prior history of Echocardiogram examinations, most                  recent 03/16/2021. Risk Factors:Hypertension and Diabetes.  Sonographer:     Charmayne Sheer Referring Phys:  [id number] Clance Boll BERGE Diagnosing Phys: Ida Rogue MD PROCEDURE: After discussion of the risks and benefits of a TEE, an informed consent was obtained from the patient. TEE procedure time was 30 minutes. The transesophogeal probe was passed without difficulty through the esophogus of the patient. Imaged were obtained with the patient in a left lateral decubitus position. Local oropharyngeal anesthetic was provided with Benzocaine spray and Cetacaine. Sedation performed by performing physician. Patients was under conscious sedation during this procedure.  Anesthetic administered: 37mg of Fentanyl, 2.042mof Versed. Image quality was excellent. The patient's vital signs; including heart rate, blood pressure, and oxygen saturation; remained stable throughout the procedure. The patient developed no complications during the procedure. IMPRESSIONS  1. No valve endocarditis noted (see aortic valve details.  2. Left ventricular ejection fraction, by estimation, is 45 to 50%. The left ventricle has mildly decreased function. The  left ventricle demonstrates global hypokinesis.  3. Right ventricular systolic function is normal. The right ventricular size is normal.  4. No left atrial/left atrial appendage thrombus was detected.  5. The mitral valve is normal in structure. Mild mitral valve regurgitation. No evidence of mitral stenosis.  6. The aortic valve is normal in structure. Aortic valve regurgitation is not visualized. Aortic valve sclerosis is present, with no evidence of aortic valve stenosis. Samll lambl excrescence noted (unable to definatively exclude  very small vegetation)  7. The inferior vena cava is normal in size with greater than 50% respiratory variability, suggesting right atrial pressure of 3 mmHg.  8. Agitated saline contrast bubble study was negative, with no evidence of any interatrial shunt.  9. There is mild (Grade II) atheroma plaque involving the aortic arch and descending aorta. 10. Tricuspid valve regurgitation is mild to moderate. Conclusion(s)/Recommendation(s): Normal biventricular function without evidence of hemodynamically significant valvular heart disease. FINDINGS  Left Ventricle: Left ventricular ejection fraction, by estimation, is 45 to 50%. The left ventricle has mildly decreased function. The left ventricle demonstrates global hypokinesis. The left ventricular internal cavity size was normal in size. There is  no left ventricular hypertrophy. Right Ventricle: The right ventricular size is normal. No increase in right ventricular wall thickness. Right ventricular systolic function is normal. Left Atrium: Left atrial size was normal in size. No left atrial/left atrial appendage thrombus was detected. Right Atrium: Right atrial size was normal in size. Pericardium: There is no evidence of pericardial effusion. Mitral Valve: The mitral valve is normal in structure. There is mild calcification of the mitral valve leaflet(s). Mild mitral valve regurgitation. No evidence of mitral valve stenosis. Tricuspid  Valve: The tricuspid valve is normal in structure. Tricuspid valve regurgitation is mild to moderate. No evidence of tricuspid stenosis. Aortic Valve: The aortic valve is normal in structure. Aortic valve regurgitation is not visualized. Aortic valve sclerosis is present, with no evidence of aortic valve stenosis. Pulmonic Valve: The pulmonic valve was normal in structure. Pulmonic valve regurgitation is not visualized. No evidence of pulmonic stenosis. Aorta: The aortic root is normal in size and structure. There is mild (Grade II) atheroma plaque involving the aortic arch and descending aorta. Venous: The inferior vena cava is normal in size with greater than 50% respiratory variability, suggesting right atrial pressure of 3 mmHg. IAS/Shunts: No atrial level shunt detected by color flow Doppler. Agitated saline contrast was given intravenously to evaluate for intracardiac shunting. Agitated saline contrast bubble study was negative, with no evidence of any interatrial shunt. Ida Rogue MD Electronically signed by Ida Rogue MD Signature Date/Time: 03/18/2021/7:29:38 PM    Final    [place] EKG SITE RITE  Result Date: 03/18/2021 If Site Rite image not attached, placement could not be confirmed due to current cardiac rhythm.    Scheduled Meds:  atorvastatin  40 mg Oral Daily   enoxaparin (LOVENOX) injection  0.5 mg/kg Subcutaneous Q24H   insulin aspart  0-20 Units Subcutaneous TID AC & HS   insulin glargine-yfgn  8 Units Subcutaneous BID   insulin starter kit- pen needles  1 kit Other Once   metoCLOPramide (REGLAN) injection  10 mg Intravenous Q8H   multivitamin with minerals   Oral Daily   pantoprazole (PROTONIX) IV  40 mg Intravenous Q12H   scopolamine  1 patch Transdermal Q72H   Continuous Infusions:   ceFAZolin (ANCEF) IV 2 g (03/19/21 0521)   promethazine (PHENERGAN) injection (IM or IVPB) 12.5 mg (03/16/21 1626)     LOS: 5 days   Time spent: 28  Nita Sells, MD [facility] To contact the attending provider between 7A-7P or the covering provider during after hours 7P-7A, please log into the web site [email or web address] and access using universal [place] password for that web site. If you do not have the password, please call the hospital operator.  03/19/2021, 10:50 AM

## 2021-03-20 DIAGNOSIS — N179 Acute kidney failure, unspecified: Secondary | ICD-10-CM | POA: Diagnosis not present

## 2021-03-20 DIAGNOSIS — R6520 Severe sepsis without septic shock: Secondary | ICD-10-CM | POA: Diagnosis not present

## 2021-03-20 DIAGNOSIS — A419 Sepsis, unspecified organism: Secondary | ICD-10-CM | POA: Diagnosis not present

## 2021-03-20 LAB — CULTURE, BLOOD (ROUTINE X 2)
Culture: NO GROWTH
Culture: NO GROWTH
Special Requests: ADEQUATE
Special Requests: ADEQUATE

## 2021-03-20 LAB — GLUCOSE, CAPILLARY
Glucose-Capillary: 156 mg/dL — ABNORMAL HIGH (ref 70–99)
Glucose-Capillary: 189 mg/dL — ABNORMAL HIGH (ref 70–99)
Glucose-Capillary: 226 mg/dL — ABNORMAL HIGH (ref 70–99)
Glucose-Capillary: 200 mg/dL — ABNORMAL HIGH (ref 70–99)

## 2021-03-20 MED ORDER — TORSEMIDE 20 MG PO TABS
10.0000 mg | ORAL_TABLET | Freq: Every day | ORAL | Status: DC
  Administered 2021-03-20 – 2021-03-27 (×8): 10 mg via ORAL
  Filled 2021-03-20 (×8): qty 1

## 2021-03-20 NOTE — TOC Progression Note (Signed)
Transition of Care [facility]) - Progression Note    Patient Details  Name: Rebecca Park MRN: [id number] Date of Birth: 05/23/1960  Transition of Care [facility]) CM/SW [place], RN Phone Number: 03/20/2021, 4:26 PM  Clinical Narrative:   Reviewed the bed offers with the patient and her spouse in the room,we reviewed the bed offers and the medicare star rating, they chose [facility], I notified Ebony Hail, the next step will be to get ins approval    Expected Discharge Plan: [place] Barriers to Discharge: Continued Medical Work up  Expected Discharge Plan and Services Expected Discharge Plan: [place]   Discharge Planning Services: CM Consult   Living arrangements for the past 2 months: Single Family Home                 DME Arranged: Youth worker wheelchair with seat cushion, 3-N-1 DME Agency: [employer] Date DME Agency Contacted: 03/17/21 Time DME Agency Contacted: 49 Representative spoke with at DME Agency: Suanne Marker HH Arranged: PT, OT [place] Agency: [facility] Date [place]: 03/17/21 Time [place]: [place] Representative spoke with at [place]: [place] ([place]) Interventions    Readmission Risk Interventions No flowsheet data found.

## 2021-03-20 NOTE — Progress Notes (Signed)
Date of Admission:  03/13/2021     ID: Rebecca Park is a 60 y.o. female Active Problems:   Osteomyelitis ([place])    Subjective: Doing well In chair No pain No fever No nausea  Medications:   atorvastatin  40 mg Oral Daily   Chlorhexidine Gluconate Cloth  6 each Topical Daily   docusate sodium  100 mg Oral BID   enoxaparin (LOVENOX) injection  0.5 mg/kg Subcutaneous Q24H   insulin aspart  0-20 Units Subcutaneous TID AC & HS   insulin glargine-yfgn  8 Units Subcutaneous BID   insulin starter kit- pen needles  1 kit Other Once   metoCLOPramide (REGLAN) injection  10 mg Intravenous Q8H   multivitamin with minerals   Oral Daily   pantoprazole (PROTONIX) IV  40 mg Intravenous Q12H   scopolamine  1 patch Transdermal Q72H   senna  1 tablet Oral Daily   sodium chloride flush  10-40 mL Intracatheter Q12H   torsemide  10 mg Oral Daily    Objective: Vital signs in last 24 hours: Temp:  [98.1 F (36.7 C)-99.5 F (37.5 C)] 98.1 F (36.7 C) (12/23 1216) Pulse Rate:  [86-91] 89 (12/23 1216) Resp:  [16] 16 (12/23 1216) BP: (104-120)/(62-66) 106/64 (12/23 1216) SpO2:  [92 %-100 %] 100 % (12/23 1216)  PHYSICAL EXAM:  General: Alert, cooperative, no distress, appears stated age.  Head: Normocephalic, without obvious abnormality, atraumatic. Eyes: Conjunctivae clear, anicteric sclerae. Pupils are equal ENT Nares normal. No drainage or sinus tenderness. Lips, mucosa, and tongue normal. No Thrush Neck: Supple, symmetrical, no adenopathy, thyroid: non tender no carotid bruit and no JVD. Back: No CVA tenderness. Lungs: Clear to auscultation bilaterally. No Wheezing or Rhonchi. No rales. Heart: Regular rate and rhythm, no murmur, rub or gallop. Abdomen: Soft, non-tender,not distended. Bowel sounds normal. No masses Extremities: Left BKA surgical site covered with dressing skin: No rashes or lesions. Or bruising Lymph: Cervical, supraclavicular normal. Neurologic: Grossly  non-focal  CBC Latest Ref Rng & Units 03/17/2021 03/15/2021 03/14/2021  WBC 4.0 - 10.5 K/uL 16.2(H) 12.2(H) 17.7(H)  Hemoglobin 12.0 - 15.0 g/dL 9.5(L) 9.5(L) 9.5(L)  Hematocrit 36.0 - 46.0 % 29.8(L) 28.9(L) 28.9(L)  Platelets 150 - 400 K/uL 179 175 154    CMP Latest Ref Rng & Units 03/17/2021 03/16/2021 03/15/2021  Glucose 70 - 99 mg/dL 124(H) 141(H) 275(H)  BUN 6 - 20 mg/dL 22(H) 29(H) 30(H)  Creatinine 0.44 - 1.00 mg/dL 0.96 1.34(H) 1.23(H)  Sodium 135 - 145 mmol/L 138 133(L) 130(L)  Potassium 3.5 - 5.1 mmol/L 3.6 3.7 3.7  Chloride 98 - 111 mmol/L 105 105 98  CO2 22 - 32 mmol/L '27 23 25  ' Calcium 8.9 - 10.3 mg/dL 8.2(L) 8.0(L) 8.8(L)  Total Protein 6.5 - 8.1 g/dL - - 5.9(L)  Total Bilirubin 0.3 - 1.2 mg/dL - - 1.6(H)  Alkaline Phos 38 - 126 U/L - - 148(H)  AST 15 - 41 U/L - - 21  ALT 0 - 44 U/L - - 18    Microbiology:03/13/2021 blood culture MSSA 03/15/2021 blood culture no growth so far 03/13/2021 surgical wound culture MSSA   Studies/Results: No results found.   Assessment/Plan: ? Diabetic foot infection of the left with sepsis  Charcot arthropathy. Medial malleolus ulcer with underlying osteomyelitis and septic arthritis. Status post BKA  MSSA bacteremia secondary to the above. Antibiotic has been de-escalated from Vanco and cefepime to cefazolin.  repeat blood cultures no growth so far 2D echo done.  TEE questions  L[place]l excrescence VS small vegetation on the aortic valve .Discussed with Dr.Gollan, as cannot say for sure will do cefazolin for 4 weeks- 04/15/20   Diabetes mellitus poorly controlled.  Now on insulin  AKI.  Resolved   Anemia.  Discussed the management with the patient and her spouse Waiting for LTAC  ID will sign off- call if needed Will follow her as OP

## 2021-03-20 NOTE — Plan of Care (Signed)
°  Problem: Education: Goal: Knowledge of General Education information will improve Description: Including pain rating scale, medication(s)/side effects and non-pharmacologic comfort measures Outcome: Progressing   Problem: Health Behavior/Discharge Planning: Goal: Ability to manage health-related needs will improve Outcome: Progressing   Problem: Clinical Measurements: Goal: Ability to maintain clinical measurements within normal limits will improve Outcome: Progressing Goal: Will remain free from infection Outcome: Progressing Goal: Diagnostic test results will improve Outcome: Progressing Goal: Respiratory complications will improve Outcome: Progressing Goal: Cardiovascular complication will be avoided Outcome: Progressing   Problem: Activity: Goal: Risk for activity intolerance will decrease Outcome: Progressing   Problem: Nutrition: Goal: Adequate nutrition will be maintained Outcome: Progressing   Problem: Coping: Goal: Level of anxiety will decrease Outcome: Progressing   Problem: Elimination: Goal: Will not experience complications related to bowel motility Outcome: Progressing Goal: Will not experience complications related to urinary retention Outcome: Progressing   Problem: Pain Managment: Goal: General experience of comfort will improve Outcome: Progressing   Problem: Safety: Goal: Ability to remain free from injury will improve Outcome: Progressing   Problem: Skin Integrity: Goal: Risk for impaired skin integrity will decrease Outcome: Progressing   Problem: Education: Goal: Ability to describe self-care measures that may prevent or decrease complications (Diabetes Survival Skills Education) will improve Outcome: Progressing Goal: Individualized Educational Video(s) Outcome: Progressing   Problem: Coping: Goal: Ability to adjust to condition or change in health will improve Outcome: Progressing   Problem: Fluid Volume: Goal: Ability to  maintain a balanced intake and output will improve Outcome: Progressing   Problem: Health Behavior/Discharge Planning: Goal: Ability to identify and utilize available resources and services will improve Outcome: Progressing Goal: Ability to manage health-related needs will improve Outcome: Progressing   Problem: Metabolic: Goal: Ability to maintain appropriate glucose levels will improve Outcome: Progressing   Problem: Nutritional: Goal: Maintenance of adequate nutrition will improve Outcome: Progressing Goal: Progress toward achieving an optimal weight will improve Outcome: Progressing   Problem: Skin Integrity: Goal: Risk for impaired skin integrity will decrease Outcome: Progressing   Problem: Education: Goal: Knowledge of the prescribed therapeutic regimen will improve Outcome: Progressing Goal: Ability to verbalize activity precautions or restrictions will improve Outcome: Progressing   Problem: Activity: Goal: Ability to perform//tolerate increased activity and mobilize with assistive devices will improve Outcome: Progressing   Problem: Clinical Measurements: Goal: Postoperative complications will be avoided or minimized Outcome: Progressing   Problem: Self-Care: Goal: Ability to meet self-care needs will improve Outcome: Progressing   Problem: Self-Concept: Goal: Ability to maintain and perform role responsibilities to the fullest extent possible will improve Outcome: Progressing

## 2021-03-20 NOTE — TOC Progression Note (Signed)
Transition of Care [facility]) - Progression Note    Patient Details  Name: Rebecca Park MRN: [id number] Date of Birth: 1960-08-06  Transition of Care [facility]) CM/SW [place], RN Phone Number: 03/20/2021, 8:59 AM  Clinical Narrative:   Insurance denied LTAC, no STR SNF bed offers yet, resnet the bedsearch thru the entire [place]    Expected Discharge Plan: [place] Barriers to Discharge: Continued Medical Work up  Expected Discharge Plan and Services Expected Discharge Plan: [place]   Discharge Planning Services: CM Consult   Living arrangements for the past 2 months: Single Family Home                 DME Arranged: Youth worker wheelchair with seat cushion, 3-N-1 DME Agency: [employer] Date DME Agency Contacted: 03/17/21 Time DME Agency Contacted: [id number] Representative spoke with at DME Agency: rhonda HH Arranged: PT, OT [place] Agency: [facility] [place] Date [place]: 03/17/21 Time [place]: [id number] Representative spoke with at [place]: [place] ([place]) Interventions    Readmission Risk Interventions No flowsheet data found.

## 2021-03-20 NOTE — Progress Notes (Signed)
Physical Therapy Treatment Patient Details Name: Rebecca Park MRN: [id number] DOB: 09-Mar-1961 Today's Date: 03/20/2021   History of Present Illness Rebecca Park is a 78yoF who comes to [facility] on 03/13/21 c N/V, hyperglycemia. Pt missed her OP FU with podiatry due to feeling ill for days. 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 Julianne Rice.    PT Comments    Pt is improving with slide board transfers as her strength and confidence improves. Currently requires ModA for proper placement of board, scooting, and maintaining safety. Pt's wife present for transfers and education on proper positioning of stump/exercises. Pt noted to have increased fluid retention compared to previous session with pitting edema noted in R LE.  No c/o SOB or fatigue during treatment. Continue per POC, d/c to rehab once medically stable .   Recommendations for follow up therapy are one component of a multi-disciplinary discharge planning process, led by the attending physician.  Recommendations may be updated based on patient status, additional functional criteria and insurance authorization.  Follow Up Recommendations  PT at Long-term acute care hospital     Assistance Recommended at Discharge Set up Supervision/Assistance  Equipment Recommendations  Other (comment) (defer to next level of care)    Recommendations for Other Services       Precautions / Restrictions Precautions Precautions: Fall Precaution Comments: new LLE BKA Restrictions Weight Bearing Restrictions: Yes LLE Weight Bearing: Non weight bearing     Mobility  Bed Mobility Overal bed mobility: Needs Assistance Bed Mobility: Supine to Sit     Supine to sit: Supervision Sit to supine: Min guard        Transfers Overall  transfer level: Needs assistance Equipment used: Sliding board Transfers: Bed to chair/wheelchair/BSC Sit to Stand: Max assist          Lateral/Scoot Transfers: Min guard General transfer comment:  (MinA to transfer bed to chair, [place] chair to bed)    Ambulation/Gait                   Stairs             Wheelchair Mobility    Modified Rankin (Stroke Patients Only)       Balance                                            Cognition Arousal/Alertness: Awake/alert Behavior During Therapy: WFL for tasks assessed/performed Overall Cognitive Status: Within Functional Limits for tasks assessed                                 General Comments: Pt is A and O x 4        Exercises      General Comments General comments (skin integrity, edema, etc.): L stump dressing intact and changed today. Pt educated on possitioning of L LE and strengthening exercises with good teach back.      Pertinent Vitals/Pain Pain Assessment: No/denies pain    Home Living  Prior Function            PT Goals (current goals can now be found in the care plan section) Acute Rehab PT Goals Patient Stated Goal: return to home, maximize healing potential to allow for future prosthesis use    Frequency    7X/week      PT Plan Discharge plan needs to be updated    Co-evaluation              AM-PAC PT "6 Clicks" Mobility   Outcome Measure  Help needed turning from your back to your side while in a flat bed without using bedrails?: A Little Help needed moving from lying on your back to sitting on the side of a flat bed without using bedrails?: A Little Help needed moving to and from a bed to a chair (including a wheelchair)?: A Little Help needed standing up from a chair using your arms (e.g., wheelchair or bedside chair)?: A Lot Help needed to walk in hospital room?: Total Help needed climbing 3-5  steps with a railing? : Total 6 Click Score: 13    End of Session Equipment Utilized During Treatment: Gait belt (slide board) Activity Tolerance: Patient tolerated treatment well Patient left: in bed;with call bell/phone within reach;with family/visitor present Nurse Communication: Mobility status PT Visit Diagnosis: Difficulty in walking, not elsewhere classified (R26.2);Other abnormalities of gait and mobility (R26.89)     Time: 1410-1455 PT Time Calculation (min) (ACUTE ONLY): 45 min  Charges:  $Therapeutic Exercise: 8-22 mins $Therapeutic Activity: 23-37 mins                    Mikel Cella, PTA    [name] 03/20/2021, 3:20 PM

## 2021-03-20 NOTE — Progress Notes (Signed)
2 Days Post-Op   Subjective/Chief Complaint: Doing well. Denies LEFT BKA stump pain.   Objective: Vital signs in last 24 hours: Temp:  [98.1 F (36.7 C)-99.5 F (37.5 C)] 98.1 F (36.7 C) (12/23 1216) Pulse Rate:  [86-93] 89 (12/23 1216) Resp:  [16-18] 16 (12/23 1216) BP: (104-120)/(62-66) 106/64 (12/23 1216) SpO2:  [92 %-100 %] 100 % (12/23 1216) Last BM Date: 03/09/21  Intake/Output from previous day: 12/22 0701 - 12/23 0700 In: -  Out: 1000 [Urine:1000] Intake/Output this shift: No intake/output data recorded.  General appearance: alert Extremities: LEFT BKA dressing changed. Scant serosang drainage on dressing, incision- C/D/I, soft, warm, viable stump  Lab Results:  No results for input(s): WBC, HGB, HCT, PLT in the last 72 hours. BMET No results for input(s): NA, K, CL, CO2, GLUCOSE, BUN, CREATININE, CALCIUM in the last 72 hours. PT/INR No results for input(s): LABPROT, INR in the last 72 hours. ABG No results for input(s): PHART, HCO3 in the last 72 hours.  Invalid input(s): PCO2, PO2  Studies/Results: No results found.  Anti-infectives: Anti-infectives (From admission, onward)    Start     Dose/Rate Route Frequency Ordered Stop   03/14/21 2200  ceFAZolin (ANCEF) IVPB 2g/100 mL premix        2 g 200 mL/hr over 30 Minutes Intravenous Every 8 hours 03/14/21 1325     03/14/21 1100  ceFEPIme (MAXIPIME) 2 g in sodium chloride 0.9 % 100 mL IVPB  Status:  Discontinued        2 g 200 mL/hr over 30 Minutes Intravenous Every 12 hours 03/14/21 0238 03/14/21 1323   03/14/21 0240  vancomycin variable dose per unstable renal function (pharmacist dosing)  Status:  Discontinued         Does not apply See admin instructions 03/14/21 0241 03/14/21 1324   03/14/21 0200  metroNIDAZOLE (FLAGYL) IVPB 500 mg  Status:  Discontinued        500 mg 100 mL/hr over 60 Minutes Intravenous Every 12 hours 03/14/21 0126 03/14/21 1324   03/14/21 0130  ceFEPIme (MAXIPIME) 2 g in sodium  chloride 0.9 % 100 mL IVPB  Status:  Discontinued        2 g 200 mL/hr over 30 Minutes Intravenous  Once 03/14/21 0126 03/14/21 0129   03/14/21 0130  vancomycin (VANCOCIN) IVPB 1000 mg/200 mL premix  Status:  Discontinued        1,000 mg 200 mL/hr over 60 Minutes Intravenous  Once 03/14/21 0126 03/14/21 0131   03/14/21 0045  ceFEPIme (MAXIPIME) 2 g in sodium chloride 0.9 % 100 mL IVPB        2 g 200 mL/hr over 30 Minutes Intravenous  Once 03/14/21 0032 03/14/21 0117   03/14/21 0045  vancomycin (VANCOCIN) IVPB 1000 mg/200 mL premix  Status:  Discontinued        1,000 mg 200 mL/hr over 60 Minutes Intravenous  Once 03/14/21 0032 03/14/21 0033   03/14/21 0045  vancomycin (VANCOREADY) IVPB 2000 mg/400 mL        2,000 mg 200 mL/hr over 120 Minutes Intravenous  Once 03/14/21 0033 03/14/21 0251       Assessment/Plan: s/p Procedure(s): TRANSESOPHAGEAL ECHOCARDIOGRAM (TEE) (N/A) S/P LEFT BKA POD # 5 OK from Vascular Standpoint to d/c when Medically stable. Continue OOB with PT Daily dressing changes per nursing F/U in office 2-3 weeks.  LOS: 6 days    Evaristo Bury 03/20/2021

## 2021-03-20 NOTE — Progress Notes (Signed)
PROGRESS NOTE   Rebecca Park  OMB:[id number] DOB: 03/28/1961 DOA: 03/13/2021 PCP: Marinda Elk, MD  Brief Narrative:  60 year old white female DM TY 2 on glipizide, HTN Charcot foot with multiple prior amputations-4 months medial malleoli infection follows with Dr. Luana Shu of podiatry  Came to ED with nausea vomiting hyperglycemia--high-grade fevers)-prior to admission without much to eat or drink vomited multiple times-had not changed medial malleolar dressing for 10 days as was supposed to podiatrist Found to have white count of 18.1, lactic acid 3.1 and was tachycardic meeting concern for sepsis AKI with creatinine 2.1-cefepime vancomycin started  Podiatry was consulted but deferred to vascular and signed off Vascular was consulted felt that she should have amputation as no viable options for repair of leg   Underwent BKA 12/18 found to also have staff aureus bacteremia  Infectious disease , orthopedics , cardiology consulted and patient underwent TEE which did not show clear-cut evidence of vegetation  Hospital-Problem based course  MSSA bacteremia ?  Small vegetation on TEE 12/21 Medial malleoli wound infection status post BKA 12/18 pain is controlled, continue Percocet for moderate pain 1-2 tab Needs cefazolin 2 g every 8 ending 1/18 and follow-up with Dr. Steva Ready outpatient Periodic intermittent labs DM TY 2 neuropathy Charcot foot CBGs 152 220 Continue Semglee 8 units twice daily with resistant sliding scale 10 twice daily on discharge as well as Trulicity once weekly [not on insulin at home] Resume glipizide on discharge HTN Losartan from prior to admission 100 held, resumed amlodipine 5 Demadex has been held and will be resumed low dose 10 mg Bicipital groove tendinosis Will ask OT to reeval and give range of motion in addition to other exercises  DVT prophylaxis: Lovenox Code Status: Full Family Communication: Discussed with wife at bedside Disposition:   Status is: Inpatient  Remains inpatient appropriate because:   Awaiting skilled facility  Consultants:  As above  Procedures: As above  Antimicrobials: Cefazolin   Subjective:  Sitting up in chair in no pain  Some tightness in shoulder still No chest pain no lower extremity pain  Objective: Vitals:   03/19/21 1536 03/19/21 1946 03/20/21 0558 03/20/21 0739  BP: 105/66 104/62 108/66 120/66  Pulse: 93 91 89 86  Resp: _0 Temp: 99 F (37.2 C) 99.5 F (37.5 C) 98.5 F (36.9 C) 98.3 F (36.8 C)  TempSrc:  Oral    SpO2: 94% 94% 92% 98%  Weight:      Height:        Intake/Output Summary (Last 24 hours) at 03/20/2021 1138 Last data filed at 03/20/2021 4536 Gross per 24 hour  Intake --  Output 1000 ml  Net -1000 ml    Filed Weights   03/13/21 2339  Weight: 104.3 kg    Examination: EOMI NCAT no focal deficit CTA B no added sound rales rhonchi Abdomen obese nontender no distention no rebound ROM intact to major muscle groups Power 5/5 although limited in left lower extremity given stump Neurologically grossly intact Psych euthymic pleasant   Data Reviewed: personally reviewed   CBC    Component Value Date/Time   WBC 16.2 (H) 03/17/2021 0429   RBC 3.47 (L) 03/17/2021 0429   HGB 9.5 (L) 03/17/2021 0429   HCT 29.8 (L) 03/17/2021 0429   PLT 179 03/17/2021 0429   MCV 85.9 03/17/2021 0429   MCH 27.4 03/17/2021 0429   MCHC 31.9 03/17/2021 0429   RDW 15.1 03/17/2021 0429   LYMPHSABS 0.5 (L) 03/15/2021  0444   MONOABS 0.6 03/15/2021 0444   EOSABS 0.0 03/15/2021 0444   BASOSABS 0.0 03/15/2021 0444   CMP Latest Ref Rng & Units 03/17/2021 03/16/2021 03/15/2021  Glucose 70 - 99 mg/dL 124(H) 141(H) 275(H)  BUN 6 - 20 mg/dL 22(H) 29(H) 30(H)  Creatinine 0.44 - 1.00 mg/dL 0.96 1.34(H) 1.23(H)  Sodium 135 - 145 mmol/L 138 133(L) 130(L)  Potassium 3.5 - 5.1 mmol/L 3.6 3.7 3.7  Chloride 98 - 111 mmol/L 105 105 98  CO2 22 - 32 mmol/L _0 Calcium  8.9 - 10.3 mg/dL 8.2(L) 8.0(L) 8.8(L)  Total Protein 6.5 - 8.1 g/dL - - 5.9(L)  Total Bilirubin 0.3 - 1.2 mg/dL - - 1.6(H)  Alkaline Phos 38 - 126 U/L - - 148(H)  AST 15 - 41 U/L - - 21  ALT 0 - 44 U/L - - 18     Radiology Studies: [place] EKG SITE RITE  Result Date: 03/18/2021 If Site Rite image not attached, placement could not be confirmed due to current cardiac rhythm.    Scheduled Meds:  atorvastatin  40 mg Oral Daily   Chlorhexidine Gluconate Cloth  6 each Topical Daily   docusate sodium  100 mg Oral BID   enoxaparin (LOVENOX) injection  0.5 mg/kg Subcutaneous Q24H   insulin aspart  0-20 Units Subcutaneous TID AC & HS   insulin glargine-yfgn  8 Units Subcutaneous BID   insulin starter kit- pen needles  1 kit Other Once   metoCLOPramide (REGLAN) injection  10 mg Intravenous Q8H   multivitamin with minerals   Oral Daily   pantoprazole (PROTONIX) IV  40 mg Intravenous Q12H   scopolamine  1 patch Transdermal Q72H   senna  1 tablet Oral Daily   sodium chloride flush  10-40 mL Intracatheter Q12H   Continuous Infusions:   ceFAZolin (ANCEF) IV 2 g (03/20/21 0523)   promethazine (PHENERGAN) injection (IM or IVPB) 12.5 mg (03/16/21 1626)     LOS: 6 days   Time spent: 20  Nita Sells, MD [facility] To contact the attending provider between 7A-7P or the covering provider during after hours 7P-7A, please log into the web site [email or web address] and access using universal [place] password for that web site. If you do not have the password, please call the hospital operator.  03/20/2021, 11:38 AM

## 2021-03-20 NOTE — NC FL2 (Signed)
[place] LEVEL OF CARE SCREENING TOOL     IDENTIFICATION  Patient Name: Rebecca Park Birthdate: 06-Jan-1961 Sex: female Admission Date (Current Location): 03/13/2021  [facility] and [place] Number:  Engineering geologist and Address:         Provider Number: [id number]  Attending Physician Name and Address:  Nita Sells, MD  Relative Name and Phone Number:  Valrie Hart [phone]    Current Level of Care: Hospital Recommended Level of Care: [place] Prior Approval Number:    Date Approved/Denied:   PASRR Number: [id number]  Discharge Plan: SNF    Current Diagnoses: Patient Active Problem List   Diagnosis Date Noted   Status post amputation of toe of right foot ([place]) 04/08/2020   Osteomyelitis ([place]) 11/08/2018   HLD (hyperlipidemia) 09/17/2018   Diabetes mellitus ([place]) 03/27/2018   White coat syndrome with diagnosis of hypertension 03/27/2018    Orientation RESPIRATION BLADDER Height & Weight     Self, Time, Situation, Place  Normal Continent, Incontinent, Indwelling catheter, External catheter Weight: 104.3 kg Height:  '5\' 6"'  (167.6 cm)  BEHAVIORAL SYMPTOMS/MOOD NEUROLOGICAL BOWEL NUTRITION STATUS      Continent Diet (regular)  AMBULATORY STATUS COMMUNICATION OF NEEDS Skin   Extensive Assist Verbally Normal, Surgical wounds                       Personal Care Assistance Level of Assistance  Bathing, Feeding, Dressing Bathing Assistance: Limited assistance Feeding assistance: Independent Dressing Assistance: Limited assistance     Functional Limitations Info             [place]  PT (By licensed PT), OT (By licensed OT)     PT Frequency: 5 times per week OT Frequency: 5 times poer week            Contractures Contractures Info: Not present    Additional Factors Info  Code Status, Allergies Code Status Info: full code Allergies Info: NKDA           Current Medications  (03/20/2021):  This is the current hospital active medication list Current Facility-Administered Medications  Medication Dose Route Frequency Provider Last Rate Last Admin   acetaminophen (TYLENOL) tablet 650 mg  650 mg Oral Q6H PRN Zara Chess, MD   650 mg at 03/18/21 0010   Or   acetaminophen (TYLENOL) suppository 650 mg  650 mg Rectal Q6H PRN Zara Chess, MD       alum & mag hydroxide-simeth (MAALOX/MYLANTA) 200-200-20 MG/5ML suspension 30 mL  30 mL Oral Q4H PRN Sreenath, Sudheer B, MD       atorvastatin (LIPITOR) tablet 40 mg  40 mg Oral Daily Zara Chess, MD   40 mg at 03/19/21 1136   ceFAZolin (ANCEF) IVPB 2g/100 mL premix  2 g Intravenous Q8H Zara Chess, MD 200 mL/hr at 03/20/21 0523 2 g at 03/20/21 6415   Chlorhexidine Gluconate Cloth 2 % PADS 6 each  6 each Topical Daily Zara Chess, MD   6 each at 03/19/21 1254   docusate sodium (COLACE) capsule 100 mg  100 mg Oral BID Sharion Settler, NP   100 mg at 03/19/21 2109   enoxaparin (LOVENOX) injection 52.5 mg  0.5 mg/kg Subcutaneous Q24H Zara Chess, MD   52.5 mg at 03/20/21 0808   insulin aspart (novoLOG) injection 0-20 Units  0-20 Units Subcutaneous TID AC & HS Zara Chess, MD   4 Units at 03/20/21 0805   insulin  glargine-yfgn [facility]) injection 8 Units  8 Units Subcutaneous BID Ralene Muskrat B, MD   8 Units at 03/19/21 2128   insulin starter kit- pen needles (English) 1 kit  1 kit Other Once [place], Sudheer B, MD       magnesium hydroxide (MILK OF MAGNESIA) suspension 30 mL  30 mL Oral Daily PRN Zara Chess, MD   30 mL at 03/19/21 3094   metoCLOPramide (REGLAN) injection 10 mg  10 mg Intravenous Marius Ditch, MD   10 mg at 03/20/21 0520   morphine 2 MG/ML injection 2 mg  2 mg Intravenous Q3H PRN Ralene Muskrat B, MD       multivitamin with minerals tablet   Oral Daily Zara Chess, MD   1 tablet at 03/19/21 1135   ondansetron (ZOFRAN) injection 4 mg  4 mg Intravenous Q6H PRN Ralene Muskrat B,  MD   4 mg at 03/16/21 1026   oxyCODONE-acetaminophen (PERCOCET/ROXICET) 5-325 MG per tablet 1-2 tablet  1-2 tablet Oral Q4H PRN Zara Chess, MD   2 tablet at 03/17/21 0146   pantoprazole (PROTONIX) injection 40 mg  40 mg Intravenous Q12H Ralene Muskrat B, MD   40 mg at 03/19/21 2110   promethazine (PHENERGAN) 12.5 mg in sodium chloride 0.9 % 50 mL IVPB  12.5 mg Intravenous Q6H PRN Zara Chess, MD 200 mL/hr at 03/16/21 1626 12.5 mg at 03/16/21 1626   scopolamine (TRANSDERM-SCOP) 1 MG/3DAYS 1.5 mg  1 patch Transdermal Q72H Sreenath, Sudheer B, MD   1.5 mg at 03/19/21 1600   senna (SENOKOT) tablet 8.6 mg  1 tablet Oral Daily Sharion Settler, NP   8.6 mg at 03/19/21 2109   sodium chloride flush (NS) 0.9 % injection 10-40 mL  10-40 mL Intracatheter Q12H Zara Chess, MD   10 mL at 03/19/21 2111   sodium chloride flush (NS) 0.9 % injection 10-40 mL  10-40 mL Intracatheter PRN Zara Chess, MD       traZODone (DESYREL) tablet 25 mg  25 mg Oral QHS PRN Zara Chess, MD   25 mg at 03/16/21 2018     Discharge Medications: Please see discharge summary for a list of discharge medications.  Relevant Imaging Results:  Relevant Lab Results:   Additional Information #[id number]  Conception Oms, RN

## 2021-03-21 DIAGNOSIS — A4101 Sepsis due to Methicillin susceptible Staphylococcus aureus: Principal | ICD-10-CM | POA: Diagnosis present

## 2021-03-21 DIAGNOSIS — E0821 Diabetes mellitus due to underlying condition with diabetic nephropathy: Secondary | ICD-10-CM | POA: Diagnosis not present

## 2021-03-21 DIAGNOSIS — M86171 Other acute osteomyelitis, right ankle and foot: Secondary | ICD-10-CM | POA: Diagnosis not present

## 2021-03-21 DIAGNOSIS — I10 Essential (primary) hypertension: Secondary | ICD-10-CM | POA: Diagnosis not present

## 2021-03-21 LAB — CBC WITH DIFFERENTIAL/PLATELET
Abs Immature Granulocytes: 0.27 10*3/uL — ABNORMAL HIGH (ref 0.00–0.07)
Basophils Absolute: 0 10*3/uL (ref 0.0–0.1)
Basophils Relative: 0 %
Eosinophils Absolute: 0.2 10*3/uL (ref 0.0–0.5)
Eosinophils Relative: 2 %
HCT: 27.7 % — ABNORMAL LOW (ref 36.0–46.0)
Hemoglobin: 8.8 g/dL — ABNORMAL LOW (ref 12.0–15.0)
Immature Granulocytes: 2 %
Lymphocytes Relative: 12 %
Lymphs Abs: 1.5 10*3/uL (ref 0.7–4.0)
MCH: 27.4 pg (ref 26.0–34.0)
MCHC: 31.8 g/dL (ref 30.0–36.0)
MCV: 86.3 fL (ref 80.0–100.0)
Monocytes Absolute: 0.7 10*3/uL (ref 0.1–1.0)
Monocytes Relative: 6 %
Neutro Abs: 9.3 10*3/uL — ABNORMAL HIGH (ref 1.7–7.7)
Neutrophils Relative %: 78 %
Platelets: 177 10*3/uL (ref 150–400)
RBC: 3.21 MIL/uL — ABNORMAL LOW (ref 3.87–5.11)
RDW: 14.8 % (ref 11.5–15.5)
WBC: 11.9 10*3/uL — ABNORMAL HIGH (ref 4.0–10.5)
nRBC: 0 % (ref 0.0–0.2)

## 2021-03-21 LAB — GLUCOSE, CAPILLARY
Glucose-Capillary: 166 mg/dL — ABNORMAL HIGH (ref 70–99)
Glucose-Capillary: 168 mg/dL — ABNORMAL HIGH (ref 70–99)
Glucose-Capillary: 182 mg/dL — ABNORMAL HIGH (ref 70–99)
Glucose-Capillary: 181 mg/dL — ABNORMAL HIGH (ref 70–99)

## 2021-03-21 NOTE — Assessment & Plan Note (Addendum)
Continue lipitor  ?

## 2021-03-21 NOTE — Progress Notes (Addendum)
Patient Details Name: Rebecca Park MRN: [id number] DOB: 24-Jul-1960 Today's Date: 03/21/2021   History of Present Illness      PT Comments    Pt declined PT intervention 2 x this date, once in Am and once in PM. Reports she needs to be "cleaned up"  (given sponge bath) prior to mobilization/ PT intervention.     Rivka Barbara PT, DPT     Particia Lather 03/21/2021, 2:25 PM

## 2021-03-21 NOTE — Progress Notes (Signed)
°  Progress Note   Patient: Rebecca Park [id number]:[id number] DOB: 1960-07-10 DOA: 03/13/2021     7 DOS: the patient was seen and examined on 03/21/2021   Brief hospital course: 60 year old white female DM TY 2 on glipizide, HTN, Charcot foot with multiple prior amputations-4 months medial malleoli infection follows with Dr. Luana Shu of podiatry came to ED with nausea vomiting hyperglycemia--high-grade fevers)-prior to admission without much to eat or drink vomited multiple times- had not changed medial malleolar dressing for 10 days admitted for sepsis AKI with creatinine 2.1-cefepime vancomycin started   12/18 - Underwent BKA - found to also have MSSA bacteremia. Infectious disease , orthopedics , cardiology consulted and patient underwent TEE which did not show clear-cut evidence of vegetation  12/24 - will need IV cefazoline for 4 weeks - till 04/15/20 per ID, waiting for SNF  Assessment and Plan * Osteomyelitis ([place])- (present on admission) Will need 4 weeks of IV cefazoline per ID till 04/15/20  Severe sepsis with acute organ dysfunction due to methicillin susceptible Staphylococcus aureus (MSSA) ([place])- (present on admission) Had AKI on admission which is resolved now. MSSA bacteremia - likely source DM foot infection of left/charcoat arthropathy. Medial malleolus ulcer with underlying osteomyelitis and septic arthritis. 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 Very small vegetation on aortic valve. Per ID cefazoline for 4 weeks - till 04/15/20  HLD (hyperlipidemia)- (present on admission) Continue lipitor  White coat syndrome with diagnosis of hypertension- (present on admission) controlled  Diabetes mellitus ([place]) A1c 10.5, [place] and SSI while in the hospital     Subjective: very pleasant. Denies any new c/o. Hoping to find SNF soon. Mother at bedside  Objective Vital signs were reviewed and unremarkable.  70 y f lying in the bed comfortably Lungs - CTA b/l, no rales  rhonchi Abdomen obese nontender no distention no rebound ROM intact to major muscle groups Power 5/5 although limited in left lower extremity given stump Neurologically grossly intact Psych euthymic pleasant  Data Reviewed: My review of labs, imaging, notes and other tests shows no new significant findings.   Family Communication: mother at bedside  Disposition: Status is: Inpatient  Remains inpatient appropriate because: waiting for SNF   DVT prophylaxis - lovenox    Time spent: 35 minutes  Author: Max Sane 03/21/2021 9:23 PM  For on call review [email or web address].

## 2021-03-21 NOTE — Hospital Course (Addendum)
60 year old white female DM TY 2 on glipizide, HTN, Charcot foot with multiple prior amputations-4 months medial malleoli infection follows with Dr. Luana Shu of podiatry came to ED with nausea vomiting hyperglycemia--high-grade fevers)-prior to admission without much to eat or drink vomited multiple times- had not changed medial malleolar dressing for 10 days admitted for sepsis AKI with creatinine 2.1-cefepime vancomycin started   12/18 - Underwent BKA - found to also have MSSA bacteremia. Infectious disease , orthopedics , cardiology consulted and patient underwent TEE which did not show clear-cut evidence of vegetation  12/24 - will need IV cefazoline for 4 weeks - till 04/15/20 per ID, waiting for SNF 12/25: DC telemetry, stopping narcotics and stool softeners as she is having loose bowel movements.  Pending SNF placement 12/26: Waiting for SNF Auth 12/27: Waiting for SNF

## 2021-03-21 NOTE — Assessment & Plan Note (Addendum)
A1c 10.5, [place] and SSI while here.

## 2021-03-21 NOTE — Assessment & Plan Note (Addendum)
Continue 4 weeks of IV cefazoline per ID till 04/15/20

## 2021-03-21 NOTE — Assessment & Plan Note (Addendum)
Had AKI on admission which is resolved now. MSSA bacteremia - likely source DM foot infection of left/charcoat arthropathy. Medial malleolus ulcer with underlying osteomyelitis and septic arthritis. 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 TEE - ? Very small vegetation on aortic valve. Per ID cefazoline for 4 weeks - till 04/15/20.

## 2021-03-21 NOTE — Assessment & Plan Note (Addendum)
controlled 

## 2021-03-22 DIAGNOSIS — E0821 Diabetes mellitus due to underlying condition with diabetic nephropathy: Secondary | ICD-10-CM | POA: Diagnosis not present

## 2021-03-22 DIAGNOSIS — M86171 Other acute osteomyelitis, right ankle and foot: Secondary | ICD-10-CM | POA: Diagnosis not present

## 2021-03-22 DIAGNOSIS — I10 Essential (primary) hypertension: Secondary | ICD-10-CM | POA: Diagnosis not present

## 2021-03-22 DIAGNOSIS — A4101 Sepsis due to Methicillin susceptible Staphylococcus aureus: Secondary | ICD-10-CM | POA: Diagnosis not present

## 2021-03-22 LAB — BASIC METABOLIC PANEL
Anion gap: 5 (ref 5–15)
BUN: 7 mg/dL (ref 6–20)
CO2: 30 mmol/L (ref 22–32)
Calcium: 7.6 mg/dL — ABNORMAL LOW (ref 8.9–10.3)
Chloride: 100 mmol/L (ref 98–111)
Creatinine, Ser: 0.7 mg/dL (ref 0.44–1.00)
GFR, Estimated: >60 mL/min (ref >60)
Glucose, Bld: 200 mg/dL — ABNORMAL HIGH (ref 70–99)
Potassium: 3.6 mmol/L (ref 3.5–5.1)
Sodium: 135 mmol/L (ref 135–145)

## 2021-03-22 LAB — CBC
HCT: 29.1 % — ABNORMAL LOW (ref 36.0–46.0)
Hemoglobin: 9.1 g/dL — ABNORMAL LOW (ref 12.0–15.0)
MCH: 26.6 pg (ref 26.0–34.0)
MCHC: 31.3 g/dL (ref 30.0–36.0)
MCV: 85.1 fL (ref 80.0–100.0)
Platelets: 201 10*3/uL (ref 150–400)
RBC: 3.42 MIL/uL — ABNORMAL LOW (ref 3.87–5.11)
RDW: 14.8 % (ref 11.5–15.5)
WBC: 13.1 10*3/uL — ABNORMAL HIGH (ref 4.0–10.5)
nRBC: 0 % (ref 0.0–0.2)

## 2021-03-22 LAB — GLUCOSE, CAPILLARY
Glucose-Capillary: 183 mg/dL — ABNORMAL HIGH (ref 70–99)
Glucose-Capillary: 149 mg/dL — ABNORMAL HIGH (ref 70–99)
Glucose-Capillary: 177 mg/dL — ABNORMAL HIGH (ref 70–99)
Glucose-Capillary: 179 mg/dL — ABNORMAL HIGH (ref 70–99)

## 2021-03-22 MED ORDER — PANTOPRAZOLE SODIUM 40 MG PO TBEC
40.0000 mg | DELAYED_RELEASE_TABLET | Freq: Two times a day (BID) | ORAL | Status: DC
  Administered 2021-03-22 – 2021-03-27 (×10): 40 mg via ORAL
  Filled 2021-03-22 (×11): qty 1

## 2021-03-22 NOTE — Progress Notes (Signed)
PHARMACIST - PHYSICIAN COMMUNICATION  CONCERNING: IV to Oral Route Change Policy  RECOMMENDATION: This patient is receiving pantoprazole by the intravenous route.  Based on criteria approved by the Pharmacy and Therapeutics Committee, the intravenous medication(s) is/are being converted to the equivalent oral dose form(s).   DESCRIPTION: These criteria include: The patient is eating (either orally or via tube) and/or has been taking other orally administered medications for a least 24 hours The patient has no evidence of active gastrointestinal bleeding or impaired GI absorption (gastrectomy, short bowel, patient on TNA or NPO).  If you have questions about this conversion, please contact the [place], [facility] 03/22/2021 10:16 AM

## 2021-03-22 NOTE — Progress Notes (Signed)
°  Progress Note   Patient: Rebecca Park [id number]:[id number] DOB: 14-Mar-1961 DOA: 03/13/2021     8 DOS: the patient was seen and examined on 03/22/2021   Brief hospital course: 60 year old white female DM TY 2 on glipizide, HTN, Charcot foot with multiple prior amputations-4 months medial malleoli infection follows with Dr. Luana Shu of podiatry came to ED with nausea vomiting hyperglycemia--high-grade fevers)-prior to admission without much to eat or drink vomited multiple times- had not changed medial malleolar dressing for 10 days admitted for sepsis AKI with creatinine 2.1-cefepime vancomycin started   12/18 - Underwent BKA - found to also have MSSA bacteremia. Infectious disease , orthopedics , cardiology consulted and patient underwent TEE which did not show clear-cut evidence of vegetation  12/24 - will need IV cefazoline for 4 weeks - till 04/15/20 per ID, waiting for SNF 12/25: DC telemetry, stopping narcotics and stool softeners as she is having loose bowel movements.  Pending SNF placement  Assessment and Plan * Osteomyelitis ([place])- (present on admission) Will need 4 weeks of IV cefazoline per ID till 04/15/20.  Severe sepsis with acute organ dysfunction due to methicillin susceptible Staphylococcus aureus (MSSA) ([place])- (present on admission) Had AKI on admission which is resolved now. MSSA bacteremia - likely source DM foot infection of left/charcoat arthropathy. Medial malleolus ulcer with underlying osteomyelitis and septic arthritis. 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 TEE - ? Very small vegetation on aortic valve. Per ID cefazoline for 4 weeks - till 04/15/20.  HLD (hyperlipidemia)- (present on admission) Continue lipitor.  White coat syndrome with diagnosis of hypertension- (present on admission) controlled.  Diabetes mellitus ([place]) A1c 10.5, [place] and SSI while in the hospital.     Subjective: Wants to stop stool softener as she is having loose bowel movement.  Mother at  bedside  Objective Vital signs were reviewed and unremarkable.  60 y f lying in the bed comfortably Lungs - CTA b/l, no rales rhonchi Abdomen obese nontender no distention no rebound ROM intact to major muscle groups Power 5/5 although limited in left lower extremity given stump Neurologically grossly intact Psych euthymic pleasant  Data Reviewed: My review of labs, imaging, notes and other tests shows no new significant findings.   Family Communication: Updated mother at bedside  Disposition: Status is: Inpatient  Remains inpatient appropriate because: Waiting for SNF placement  DVT prophylaxis: Lovenox  Time spent: 35 minutes  Author: Max Sane 03/22/2021 1:41 PM  For on call review [email or web address].

## 2021-03-23 DIAGNOSIS — I10 Essential (primary) hypertension: Secondary | ICD-10-CM | POA: Diagnosis not present

## 2021-03-23 DIAGNOSIS — M86171 Other acute osteomyelitis, right ankle and foot: Secondary | ICD-10-CM | POA: Diagnosis not present

## 2021-03-23 DIAGNOSIS — E0821 Diabetes mellitus due to underlying condition with diabetic nephropathy: Secondary | ICD-10-CM | POA: Diagnosis not present

## 2021-03-23 DIAGNOSIS — A4101 Sepsis due to Methicillin susceptible Staphylococcus aureus: Secondary | ICD-10-CM | POA: Diagnosis not present

## 2021-03-23 LAB — GLUCOSE, CAPILLARY
Glucose-Capillary: 148 mg/dL — ABNORMAL HIGH (ref 70–99)
Glucose-Capillary: 168 mg/dL — ABNORMAL HIGH (ref 70–99)
Glucose-Capillary: 155 mg/dL — ABNORMAL HIGH (ref 70–99)
Glucose-Capillary: 176 mg/dL — ABNORMAL HIGH (ref 70–99)

## 2021-03-23 NOTE — Progress Notes (Signed)
Occupational Therapy Treatment Patient Details Name: JAMIRIA LANGILL MRN: [id number] DOB: [birth year] Today's Date: 03/23/2021   History of present illness Cinde Aikman is a 28yoF who comes to [facility] on 03/13/21 c N/V, hyperglycemia. Pt missed her OP FU with podiatry due to feeling ill for days. 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 Julianne Rice.   OT comments  Upon entering the room, pt seated in recliner chair with no c/o pain but does report fatigue. She discussed toilet transfer from this morning and difficulties with standing for hygiene. OT discussed need for drop arm commode chair and plans to trial next session. OT placing chair next to bed and dropping arm rest down. Pt is able to laterally scoot to the L back to bed with close supervision and steadying of equipment. No slide board used. Once seated on EOB. OT tied theraband around pt's thights to simulates LB clothing for pt to practice heavy lateral leans side to side for LB clothing management. OT discussed energy conservation and importance of getting body far over to side to pull over B hips. Pt also leaning to see if possible way for her to perform hygiene if needed in this manner. Hygiene may be more difficult due to body habitus. Pt able to get theraband over B hips with practice and increased time. Pt performed sit >supine without assistance. All needs within reach. Pt continues to benefit from OT intervention.    Recommendations for follow up therapy are one component of a multi-disciplinary discharge planning process, led by the attending physician.  Recommendations may be updated based on patient status, additional functional criteria and insurance authorization.    Follow Up Recommendations  Follow physician's  recommendations for discharge plan and follow up therapies    Assistance Recommended at Discharge Intermittent Supervision/Assistance  Equipment Recommendations  Wheelchair (measurements OT);Wheelchair cushion (measurements OT);Other (comment) (drop arm BSC)       Precautions / Restrictions Precautions Precautions: Fall Precaution Comments: new LLE BKA Restrictions Weight Bearing Restrictions: Yes LLE Weight Bearing: Non weight bearing       Mobility Bed Mobility Overal bed mobility: Needs Assistance Bed Mobility: Sit to Supine       Sit to supine: Supervision        Transfers Overall transfer level: Needs assistance                Lateral/Scoot Transfers: Supervision       Balance Overall balance assessment: Needs assistance Sitting-balance support: No upper extremity supported;Feet supported Sitting balance-Leahy Scale: Good                                     ADL either performed or assessed with clinical judgement   ADL Overall ADL's : Needs assistance/impaired                                             Vision Patient Visual Report: No change from baseline            Cognition Arousal/Alertness: Awake/alert Behavior During Therapy: WFL for tasks assessed/performed Overall Cognitive Status:  Within Functional Limits for tasks assessed                                 General Comments: Pt is A and O x 4          Exercises Other Exercises Other Exercises: STS from BSC x4 in session for ADL completion Other Exercises: Lateral weightshifting on BSC for doffing under-roos           Pertinent Vitals/ Pain       Pain Assessment: No/denies pain         Frequency  Min 3X/week        Progress Toward Goals  OT Goals(current goals can now be found in the care plan section)  Progress towards OT goals: Progressing toward goals  Acute Rehab OT Goals Patient Stated Goal: to get stronger  and go to rehab OT Goal Formulation: With patient/family Time For Goal Achievement: 03/31/21 Potential to Achieve Goals: Good  Plan Discharge plan remains appropriate;Frequency remains appropriate       AM-PAC OT "6 Clicks" Daily Activity     Outcome Measure   Help from another person eating meals?: None Help from another person taking care of personal grooming?: None Help from another person toileting, which includes using toliet, bedpan, or urinal?: A Lot Help from another person bathing (including washing, rinsing, drying)?: A Lot Help from another person to put on and taking off regular upper body clothing?: None Help from another person to put on and taking off regular lower body clothing?: A Lot 6 Click Score: 18    End of Session    OT Visit Diagnosis: Other abnormalities of gait and mobility (R26.89);Muscle weakness (generalized) (M62.81)   Activity Tolerance Patient tolerated treatment well   Patient Left with family/visitor present;in bed;with nursing/sitter in room;with call bell/phone within reach;with bed alarm set   Nurse Communication Mobility status        Time: 5883-2549 OT Time Calculation (min): 23 min  Charges: OT General Charges $OT Visit: 1 Visit OT Treatments $Self Care/Home Management : 23-37 mins  Darleen Crocker, MS, OTR/L , CBIS ascom [phone]  03/23/21, 3:45 PM

## 2021-03-23 NOTE — Progress Notes (Signed)
°  Progress Note   Patient: Rebecca Park [id number] DOB: 10-11-1960 DOA: 03/13/2021     9 DOS: the patient was seen and examined on 03/23/2021   Brief hospital course: 60 year old white female DM TY 2 on glipizide, HTN, Charcot foot with multiple prior amputations-4 months medial malleoli infection follows with Dr. Luana Shu of podiatry came to ED with nausea vomiting hyperglycemia--high-grade fevers)-prior to admission without much to eat or drink vomited multiple times- had not changed medial malleolar dressing for 10 days admitted for sepsis AKI with creatinine 2.1-cefepime vancomycin started   12/18 - Underwent BKA - found to also have MSSA bacteremia. Infectious disease , orthopedics , cardiology consulted and patient underwent TEE which did not show clear-cut evidence of vegetation  12/24 - will need IV cefazoline for 4 weeks - till 04/15/20 per ID, waiting for SNF 12/25: DC telemetry, stopping narcotics and stool softeners as she is having loose bowel movements.  Pending SNF placement 12/26: Waiting for SNF Auth  Assessment and Plan * Osteomyelitis ([place])- (present on admission) Continue 4 weeks of IV cefazoline per ID till 04/15/20.  Severe sepsis with acute organ dysfunction due to methicillin susceptible Staphylococcus aureus (MSSA) ([place])- (present on admission) Had AKI on admission which is resolved now. MSSA bacteremia - likely source DM foot infection of left/charcoat arthropathy. Medial malleolus ulcer with underlying osteomyelitis and septic arthritis. 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 TEE - ? Very small vegetation on aortic valve. Per ID cefazoline for 4 weeks - till 04/15/20  HLD (hyperlipidemia)- (present on admission) Continue lipitor  White coat syndrome with diagnosis of hypertension- (present on admission) controlled  Diabetes mellitus ([place]) A1c 10.5, [place] and SSI while here    Subjective: No new complaints.  Waiting for SNF Auth.  Mother at bedside  Objective Vital  signs were reviewed and unremarkable.  60 y f lying in the bed comfortably Lungs - CTA b/l, no rales rhonchi Abdomen obese nontender no distention no rebound ROM intact to major muscle groups Power 5/5 although limited in left lower extremity given stump Neurologically grossly intact Psych euthymic pleasant  Data Reviewed: My review of labs, imaging, notes and other tests shows no new significant findings.   Family Communication: Updated mother at bedside  Disposition: Status is: Inpatient  Remains inpatient appropriate because: Medically stable, waiting for insurance Auth for SNF   DVT prophylaxis-Lovenox   Time spent: 35 minutes  Author: Max Sane 03/23/2021 12:53 PM  For on call review [email or web address].

## 2021-03-23 NOTE — TOC Progression Note (Signed)
Transition of Care [facility]) - Progression Note    Patient Details  Name: Rebecca Park MRN: [id number] Date of Birth: Jan 25, 1961  Transition of Care [facility]) CM/SW [place], RN Phone Number: 03/23/2021, 1:31 PM  Clinical Narrative:    [facility] is closed today, unable to start the ins auth, will start in the AM   Expected Discharge Plan: [place] Barriers to Discharge: Continued Medical Work up  Expected Discharge Plan and Services Expected Discharge Plan: [place]   Discharge Planning Services: CM Consult   Living arrangements for the past 2 months: Single Family Home                 DME Arranged: Youth worker wheelchair with seat cushion, 3-N-1 DME Agency: [employer] Date DME Agency Contacted: 03/17/21 Time DME Agency Contacted: 67 Representative spoke with at DME Agency: [facility] HH Arranged: PT, OT [place] Agency: [facility] Date [place]: 03/17/21 Time [place]: [place] Representative spoke with at [place]: [place] ([place]) Interventions    Readmission Risk Interventions No flowsheet data found.

## 2021-03-23 NOTE — TOC Progression Note (Signed)
Transition of Care [facility]) - Progression Note    Patient Details  Name: Rebecca Park MRN: [id number] Date of Birth: 01-06-1961  Transition of Care [facility]) CM/SW [place], RN Phone Number: 03/23/2021, 8:52 AM  Clinical Narrative:   called [facility] and left a secure VM for Shanna at [phone] to follow up on status of auth, will continue to follow up, awaiting a response    Expected Discharge Plan: [place] Barriers to Discharge: Continued Medical Work up  Expected Discharge Plan and Services Expected Discharge Plan: [place]   Discharge Planning Services: CM Consult   Living arrangements for the past 2 months: Single Family Home                 DME Arranged: Youth worker wheelchair with seat cushion, 3-N-1 DME Agency: [employer] Date DME Agency Contacted: 03/17/21 Time DME Agency Contacted: 52 Representative spoke with at DME Agency: rhonda HH Arranged: PT, OT [place] Agency: [facility] Date [place]: 03/17/21 Time [place]: [place] Representative spoke with at [place]: [place] ([place]) Interventions    Readmission Risk Interventions No flowsheet data found.

## 2021-03-23 NOTE — Progress Notes (Signed)
Physical Therapy Treatment Patient Details Name: Rebecca Park MRN: [id number] DOB: [birth year] Today's Date: 03/23/2021   History of Present Illness Rebecca Park is a 33yoF who comes to [facility] on 03/13/21 c N/V, hyperglycemia. Pt missed her OP FU with podiatry due to feeling ill for days. 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 Julianne Rice.    PT Comments    :Pt in bed on entry, ready for session. Pt wants to get to [facility] today. modI bed mobility, excellent seated EOB balance. Pt able to lateral scoot on EOB x12 ft. MaxA for several STS and SPT in session for a complex and multifaceted toileting session. NA Sydell Axon helps with +2 care, as pt lacks sufficient strength in RLE for length standing and or standing use BU Efor don/doff underpants. No pain this date. Pt remains very motivated.     Recommendations for follow up therapy are one component of a multi-disciplinary discharge planning process, led by the attending physician.  Recommendations may be updated based on patient status, additional functional criteria and insurance authorization.  Follow Up Recommendations  Skilled nursing-short term rehab (<3 hours/day)     Assistance Recommended at Discharge Set up Supervision/Assistance  Equipment Recommendations       Recommendations for Other Services       Precautions / Restrictions Precautions Precautions: Fall Precaution Comments: new LLE BKA     Mobility  Bed Mobility Overal bed mobility: Modified Independent       Supine to sit: Modified independent (Device/Increase time)          Transfers Overall transfer level: Needs assistance Equipment used: None;1 person hand held assist Transfers: Bed to chair/wheelchair/BSC   Stand pivot transfers: Max assist;+2  physical assistance        Lateral/Scoot Transfers: Supervision (lateral scoots entire length of bed each direction, fatigue in RLE midway)      Ambulation/Gait                   Stairs             Wheelchair Mobility    Modified Rankin (Stroke Patients Only)       Balance             Standing balance-Leahy Scale: Zero Standing balance comment: unable to manage undergarments to prepare for [facility] landing.                            Cognition Arousal/Alertness: Awake/alert Behavior During Therapy: WFL for tasks assessed/performed Overall Cognitive Status: Within Functional Limits for tasks assessed                                          Exercises Other Exercises Other Exercises: STS from [facility] x4 in session for ADL completion Other Exercises: Lateral weightshifting on BSC for doffing under-roos    General Comments        Pertinent Vitals/Pain Pain Assessment: No/denies pain    Home Living                          Prior Function  PT Goals (current goals can now be found in the care plan section) Acute Rehab PT Goals Patient Stated Goal: return to home, maximize healing potential to allow for future prosthesis use PT Goal Formulation: With patient Time For Goal Achievement: 03/30/21 Potential to Achieve Goals: Fair Progress towards PT goals: Progressing toward goals    Frequency    7X/week      PT Plan Current plan remains appropriate    Co-evaluation              AM-PAC PT "6 Clicks" Mobility   Outcome Measure  Help needed turning from your back to your side while in a flat bed without using bedrails?: None Help needed moving from lying on your back to sitting on the side of a flat bed without using bedrails?: None Help needed moving to and from a bed to a chair (including a wheelchair)?: A Lot Help needed standing up from a chair using your arms (e.g., wheelchair or  bedside chair)?: Total Help needed to walk in hospital room?: Total Help needed climbing 3-5 steps with a railing? : Total 6 Click Score: 13    End of Session Equipment Utilized During Treatment: Gait belt Activity Tolerance: Patient tolerated treatment well Patient left: with call bell/phone within reach;with family/visitor present;in chair;with nursing/sitter in room Nurse Communication: Mobility status PT Visit Diagnosis: Difficulty in walking, not elsewhere classified (R26.2);Other abnormalities of gait and mobility (R26.89)     Time: 3254-9826 PT Time Calculation (min) (ACUTE ONLY): 30 min  Charges:  $Therapeutic Exercise: 23-37 mins                    12:06 PM, 03/23/21 Etta Grandchild, PT, DPT Physical Therapist - [facility]  [phone] ([place])    [place] C 03/23/2021, 12:04 PM

## 2021-03-24 DIAGNOSIS — A4101 Sepsis due to Methicillin susceptible Staphylococcus aureus: Secondary | ICD-10-CM | POA: Diagnosis not present

## 2021-03-24 DIAGNOSIS — I10 Essential (primary) hypertension: Secondary | ICD-10-CM | POA: Diagnosis not present

## 2021-03-24 DIAGNOSIS — M86171 Other acute osteomyelitis, right ankle and foot: Secondary | ICD-10-CM | POA: Diagnosis not present

## 2021-03-24 DIAGNOSIS — E0821 Diabetes mellitus due to underlying condition with diabetic nephropathy: Secondary | ICD-10-CM | POA: Diagnosis not present

## 2021-03-24 LAB — GLUCOSE, CAPILLARY
Glucose-Capillary: 140 mg/dL — ABNORMAL HIGH (ref 70–99)
Glucose-Capillary: 165 mg/dL — ABNORMAL HIGH (ref 70–99)
Glucose-Capillary: 149 mg/dL — ABNORMAL HIGH (ref 70–99)
Glucose-Capillary: 172 mg/dL — ABNORMAL HIGH (ref 70–99)

## 2021-03-24 NOTE — Progress Notes (Signed)
° °Date of Admission:  03/13/2021    ° °ID: Rebecca Park is a 60 y.o. female Principal Problem: °  Osteomyelitis (HCC) °Active Problems: °  Diabetes mellitus (HCC) °  White coat syndrome with diagnosis of hypertension °  HLD (hyperlipidemia) °  Severe sepsis with acute organ dysfunction due to methicillin susceptible Staphylococcus aureus (MSSA) (HCC) ° ° ° °Subjective: °Doing much better °No pain °No fever °No nausea or vomiting ° °Medications:  ° atorvastatin  40 mg Oral Daily  ° Chlorhexidine Gluconate Cloth  6 each Topical Daily  ° enoxaparin (LOVENOX) injection  0.5 mg/kg Subcutaneous Q24H  ° insulin aspart  0-20 Units Subcutaneous TID AC & HS  ° insulin glargine-yfgn  8 Units Subcutaneous BID  ° insulin starter kit- pen needles  1 kit Other Once  ° multivitamin with minerals   Oral Daily  ° pantoprazole  40 mg Oral BID  ° senna  1 tablet Oral Daily  ° sodium chloride flush  10-40 mL Intracatheter Q12H  ° torsemide  10 mg Oral Daily  ° ° °Objective: °Vital signs in last 24 hours: °Temp:  [98.3 °F (36.8 °C)-99.1 °F (37.3 °C)] 98.3 °F (36.8 °C) (12/27 1527) °Pulse Rate:  [81-89] 86 (12/27 1527) °Resp:  [18-20] 18 (12/27 1527) °BP: (102-126)/(59-68) 116/62 (12/27 1527) °SpO2:  [91 %-97 %] 97 % (12/27 1527) ° °PHYSICAL EXAM:  °General: Alert, cooperative, no distress, appears stated age.  °Lungs: Clear to auscultation bilaterally. No Wheezing or Rhonchi. No rales. °Heart: Regular rate and rhythm, no murmur, rub or gallop. °Abdomen: Soft, non-tender,not distended. Bowel sounds normal. No masses °Extremities: Left BKA surgical site looks good- coapted well ° ° °Lymph: Cervical, supraclavicular normal. °Neurologic: Grossly non-focal ° °CBC Latest Ref Rng & Units 03/22/2021 03/21/2021 03/17/2021  °WBC 4.0 - 10.5 K/uL 13.1(H) 11.9(H) 16.2(H)  °Hemoglobin 12.0 - 15.0 g/dL 9.1(L) 8.8(L) 9.5(L)  °Hematocrit 36.0 - 46.0 % 29.1(L) 27.7(L) 29.8(L)  °Platelets 150 - 400 K/uL 201 177 179  °  °CMP Latest Ref Rng & Units  03/22/2021 03/17/2021 03/16/2021  °Glucose 70 - 99 mg/dL 200(H) 124(H) 141(H)  °BUN 6 - 20 mg/dL 7 22(H) 29(H)  °Creatinine 0.44 - 1.00 mg/dL 0.70 0.96 1.34(H)  °Sodium 135 - 145 mmol/L 135 138 133(L)  °Potassium 3.5 - 5.1 mmol/L 3.6 3.6 3.7  °Chloride 98 - 111 mmol/L 100 105 105  °CO2 22 - 32 mmol/L 30 27 23  °Calcium 8.9 - 10.3 mg/dL 7.6(L) 8.2(L) 8.0(L)  °Total Protein 6.5 - 8.1 g/dL - - -  °Total Bilirubin 0.3 - 1.2 mg/dL - - -  °Alkaline Phos 38 - 126 U/L - - -  °AST 15 - 41 U/L - - -  °ALT 0 - 44 U/L - - -  °  °Microbiology:03/13/2021 blood culture MSSA °03/15/2021 blood culture no growth so far °03/13/2021 surgical wound culture MSSA ° ° °Assessment/Plan: °? °Diabetic foot infection of the left with sepsis ° Charcot arthropathy. °Medial malleolus ulcer with underlying osteomyelitis and septic arthritis. °Status post BKA ° °MSSA bacteremia secondary to the above. °On cefazolin. ° repeat blood cultures no growth  ° TEE questions Lambl excrescence VS small vegetation on the aortic valve .Discussed with Dr.Gollan, as cannot say for sure will do cefazolin for 4 weeks- 04/15/20 °  °Diabetes mellitus poorly controlled.  Now on insulin ° °AKI.  Resolved °  °Anemia. ° °Discussed the management with the patient  °Will follow her as OP- on 04/09/21 at 11.15 am ° °Waiting   Waiting for LTAC  ID will sign off- call if needed

## 2021-03-24 NOTE — Progress Notes (Signed)
Physical Therapy Treatment Patient Details Name: Rebecca Park MRN: [id number] DOB: 19-Dec-1960 Today's Date: 03/24/2021   History of Present Illness Rebecca Park is a 32yoF who comes to [facility] on 03/13/21 c N/V, hyperglycemia. Pt missed her OP FU with podiatry due to feeling ill for days. 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 Julianne Rice.    PT Comments    Pt in bed at entry, modI to EOB, set-up and minGuard lateral scoot to drop arm recliner, seated trunk control and strength exercises in recliner, quick onset fatigue, pt has limited strength still. Returned later to educated NA on lateral scoot transfers. Squat pivot transfer still not really safe given degrees of weakness in standing.    Recommendations for follow up therapy are one component of a multi-disciplinary discharge planning process, led by the attending physician.  Recommendations may be updated based on patient status, additional functional criteria and insurance authorization.  Follow Up Recommendations  Skilled nursing-short term rehab (<3 hours/day)     Assistance Recommended at Discharge Set up Supervision/Assistance  Equipment Recommendations  Other (comment)    Recommendations for Other Services       Precautions / Restrictions Precautions Precautions: Fall Precaution Comments: new LLE BKA Restrictions Weight Bearing Restrictions: No     Mobility  Bed Mobility Overal bed mobility: Modified Independent                  Transfers Overall transfer level: Needs assistance   Transfers: Bed to chair/wheelchair/BSC            Lateral/Scoot Transfers: Supervision General transfer comment: EOB to recliner at Rt side; [place] provides lateral chair stabilizaion    Ambulation/Gait                    Stairs             Wheelchair Mobility    Modified Rankin (Stroke Patients Only)       Balance                                            Cognition Arousal/Alertness: Awake/alert Behavior During Therapy: WFL for tasks assessed/performed Overall Cognitive Status: Within Functional Limits for tasks assessed                                          Exercises Other Exercises Other Exercises: recliner sitting RUE reach to floor and back to tall sitting x5 Other Exercises: maximal forward flexion with BUE pulling self into buttocks clearance from recliner x5 (author stabilizaes chair)    General Comments        Pertinent Vitals/Pain Pain Assessment: No/denies pain    Home Living                          Prior Function            PT Goals (current goals can now be found in the care plan section) Acute Rehab PT Goals Patient Stated Goal: return to home, maximize  healing potential to allow for future prosthesis use PT Goal Formulation: With patient Time For Goal Achievement: 03/30/21 Potential to Achieve Goals: Fair Progress towards PT goals: Progressing toward goals    Frequency    7X/week      PT Plan Current plan remains appropriate    Co-evaluation              AM-PAC PT "6 Clicks" Mobility   Outcome Measure  Help needed turning from your back to your side while in a flat bed without using bedrails?: None Help needed moving from lying on your back to sitting on the side of a flat bed without using bedrails?: None Help needed moving to and from a bed to a chair (including a wheelchair)?: A Little Help needed standing up from a chair using your arms (e.g., wheelchair or bedside chair)?: A Little Help needed to walk in hospital room?: Total Help needed climbing 3-5 steps with a railing? : Total 6 Click Score: 16    End of Session   Activity Tolerance: Patient tolerated  treatment well;No increased pain;Patient limited by fatigue Patient left: with call bell/phone within reach;with family/visitor present;in chair;with nursing/sitter in room Nurse Communication: Mobility status PT Visit Diagnosis: Difficulty in walking, not elsewhere classified (R26.2);Other abnormalities of gait and mobility (R26.89)     Time: 8185-6314 PT Time Calculation (min) (ACUTE ONLY): 25 min  Charges:  $Therapeutic Exercise: 23-37 mins                    3:15 PM, 03/24/21 Etta Grandchild, PT, DPT Physical Therapist - [facility]  [phone] ([place])     [place] C 03/24/2021, 3:13 PM

## 2021-03-24 NOTE — Plan of Care (Signed)
°  Problem: Education: Goal: Knowledge of General Education information will improve Description: Including pain rating scale, medication(s)/side effects and non-pharmacologic comfort measures Outcome: Progressing   Problem: Health Behavior/Discharge Planning: Goal: Ability to manage health-related needs will improve Outcome: Progressing   Problem: Clinical Measurements: Goal: Ability to maintain clinical measurements within normal limits will improve Outcome: Progressing Goal: Will remain free from infection Outcome: Progressing Goal: Diagnostic test results will improve Outcome: Progressing Goal: Respiratory complications will improve Outcome: Progressing Goal: Cardiovascular complication will be avoided Outcome: Progressing   Problem: Activity: Goal: Risk for activity intolerance will decrease Outcome: Progressing   Problem: Nutrition: Goal: Adequate nutrition will be maintained Outcome: Progressing   Problem: Coping: Goal: Level of anxiety will decrease Outcome: Progressing   Problem: Elimination: Goal: Will not experience complications related to bowel motility Outcome: Progressing Goal: Will not experience complications related to urinary retention Outcome: Progressing   Problem: Pain Managment: Goal: General experience of comfort will improve Outcome: Progressing   Problem: Safety: Goal: Ability to remain free from injury will improve Outcome: Progressing   Problem: Skin Integrity: Goal: Risk for impaired skin integrity will decrease Outcome: Progressing   Problem: Education: Goal: Ability to describe self-care measures that may prevent or decrease complications (Diabetes Survival Skills Education) will improve Outcome: Progressing   Problem: Coping: Goal: Ability to adjust to condition or change in health will improve Outcome: Progressing   Problem: Fluid Volume: Goal: Ability to maintain a balanced intake and output will improve Outcome:  Progressing   Problem: Health Behavior/Discharge Planning: Goal: Ability to identify and utilize available resources and services will improve Outcome: Progressing Goal: Ability to manage health-related needs will improve Outcome: Progressing   Problem: Metabolic: Goal: Ability to maintain appropriate glucose levels will improve Outcome: Progressing   Problem: Nutritional: Goal: Maintenance of adequate nutrition will improve Outcome: Progressing Goal: Progress toward achieving an optimal weight will improve Outcome: Progressing   Problem: Skin Integrity: Goal: Risk for impaired skin integrity will decrease Outcome: Progressing   Problem: Tissue Perfusion: Goal: Adequacy of tissue perfusion will improve Outcome: Progressing   Problem: Education: Goal: Knowledge of the prescribed therapeutic regimen will improve Outcome: Progressing Goal: Ability to verbalize activity precautions or restrictions will improve Outcome: Progressing Goal: Understanding of discharge needs will improve Outcome: Progressing   Problem: Activity: Goal: Ability to perform//tolerate increased activity and mobilize with assistive devices will improve Outcome: Progressing   Problem: Clinical Measurements: Goal: Postoperative complications will be avoided or minimized Outcome: Progressing   Problem: Self-Care: Goal: Ability to meet self-care needs will improve Outcome: Progressing   Problem: Self-Concept: Goal: Ability to maintain and perform role responsibilities to the fullest extent possible will improve Outcome: Progressing   Problem: Pain Management: Goal: Pain level will decrease with appropriate interventions Outcome: Progressing   Problem: Skin Integrity: Goal: Demonstration of wound healing without infection will improve Outcome: Progressing

## 2021-03-24 NOTE — TOC Progression Note (Signed)
Transition of Care [facility]) - Progression Note    Patient Details  Name: JAYNI PRESCHER MRN: [id number] Date of Birth: 11-26-1960  [facility] CM/SW Contact  Conception Oms, RN Phone Number: 03/24/2021, 2:29 PM  Clinical Narrative:   contacted [place] [facility] to check on the insurance auth status, Still pending, I sent the Covid Vaccine card to them    Expected Discharge Plan: [place] Barriers to Discharge: Continued Medical Work up  Expected Discharge Plan and Services Expected Discharge Plan: [place]   Discharge Planning Services: CM Consult   Living arrangements for the past 2 months: Single Family Home                 DME Arranged: Youth worker wheelchair with seat cushion, 3-N-1 DME Agency: [employer] Date DME Agency Contacted: 03/17/21 Time DME Agency Contacted: 1751 Representative spoke with at DME Agency: rhonda HH Arranged: PT, OT HH Agency: [facility] Date [place]: 03/17/21 Time [place]: [place] Representative spoke with at [place]: [place] ([place]) Interventions    Readmission Risk Interventions No flowsheet data found.

## 2021-03-24 NOTE — Progress Notes (Signed)
°  Progress Note   Patient: Rebecca Park UPJ:[id number] DOB: [birth year] DOA: 03/13/2021     10 DOS: the patient was seen and examined on 03/24/2021   Brief hospital course: 60 year old white female DM TY 2 on glipizide, HTN, Charcot foot with multiple prior amputations-4 months medial malleoli infection follows with Dr. Luana Shu of podiatry came to ED with nausea vomiting hyperglycemia--high-grade fevers)-prior to admission without much to eat or drink vomited multiple times- had not changed medial malleolar dressing for 10 days admitted for sepsis AKI with creatinine 2.1-cefepime vancomycin started   12/18 - Underwent BKA - found to also have MSSA bacteremia. Infectious disease , orthopedics , cardiology consulted and patient underwent TEE which did not show clear-cut evidence of vegetation  12/24 - will need IV cefazoline for 4 weeks - till 04/15/20 per ID, waiting for SNF 12/25: DC telemetry, stopping narcotics and stool softeners as she is having loose bowel movements.  Pending SNF placement 12/26: Waiting for SNF Auth 12/27: Waiting for SNF  Assessment and Plan * Osteomyelitis ([place])- (present on admission) Continue 4 weeks of IV cefazoline per ID till 04/15/20  Severe sepsis with acute organ dysfunction due to methicillin susceptible Staphylococcus aureus (MSSA) ([place])- (present on admission) Had AKI on admission which is resolved now. MSSA bacteremia - likely source DM foot infection of left/charcoat arthropathy. Medial malleolus ulcer with underlying osteomyelitis and septic arthritis. 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 TEE - ? Very small vegetation on aortic valve. Per ID cefazoline for 4 weeks - till 04/15/20.  HLD (hyperlipidemia)- (present on admission) Continue lipitor.  White coat syndrome with diagnosis of hypertension- (present on admission) controlled.  Diabetes mellitus (HCC) A1c 10.5, [place] and SSI while here.   Subjective: No new issues.  Waiting for insurance Auth for SNF  placement  Objective Vital signs were reviewed and unremarkable.  60 y f lying in the bed comfortably Lungs - CTA b/l, no rales rhonchi Abdomen obese nontender no distention no rebound ROM intact to major muscle groups Power 5/5 although limited in left lower extremity given stump Neurologically grossly intact Psych normal mood and affect  Data Reviewed: My review of labs, imaging, notes and other tests shows no new significant findings.   Family Communication: Mother at bedside  Disposition: Status is: Inpatient  Remains inpatient appropriate because: Waiting for insurance Auth for SNF placement  DVT prophylaxis-Lovenox  Time spent: 35 minutes  Author: Max Sane 03/24/2021 12:40 PM  For on call review [email or web address].

## 2021-03-25 DIAGNOSIS — M86171 Other acute osteomyelitis, right ankle and foot: Secondary | ICD-10-CM | POA: Diagnosis not present

## 2021-03-25 LAB — GLUCOSE, CAPILLARY
Glucose-Capillary: 132 mg/dL — ABNORMAL HIGH (ref 70–99)
Glucose-Capillary: 154 mg/dL — ABNORMAL HIGH (ref 70–99)
Glucose-Capillary: 168 mg/dL — ABNORMAL HIGH (ref 70–99)

## 2021-03-25 NOTE — Progress Notes (Signed)
Physical Therapy Treatment Patient Details Name: Rebecca Park MRN: [id number] DOB: [birth year] Today's Date: 03/25/2021   History of Present Illness Rebecca Park is a 45yoF who comes to [facility] on 03/13/21 c N/V, hyperglycemia. Pt missed her OP FU with podiatry due to feeling ill for days. 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 Julianne Rice.    PT Comments    Pt already in recliner at entry, agreeable to session, no pain. Pt taken through BLE chair based exercises. Attempted new technique for STS transfers today, as pt has excellent seated trunk control/balance. Despite surface elevation, pt struggles, and still requires maxA + foot block to achived standing. Pt has improved standing capacity this date without buckling of knee, but has subjective soreness gesturing to Rt VMO. P tup in chair at end of session, all needs met, lunch tray arrived.    Recommendations for follow up therapy are one component of a multi-disciplinary discharge planning process, led by the attending physician.  Recommendations may be updated based on patient status, additional functional criteria and insurance authorization.  Follow Up Recommendations  Skilled nursing-short term rehab (<3 hours/day)     Assistance Recommended at Discharge Set up Supervision/Assistance  Equipment Recommendations       Recommendations for Other Services       Precautions / Restrictions Precautions Precautions: Fall Precaution Comments: new LLE BKA Restrictions Weight Bearing Restrictions: No     Mobility  Bed Mobility               General bed mobility comments: in recliner at entry    Transfers Overall transfer level: Needs assistance Equipment used:  (bedrail)   Sit to Stand: Max assist            General transfer comment: STS remains extrememly challenging and limited, but today is able to remain in standing a bit longer    Ambulation/Gait                   Stairs             Wheelchair Mobility    Modified Rankin (Stroke Patients Only)       Balance                                            Cognition                                                Exercises Amputee Exercises Gluteal Sets: 10 reps;Seated;Both Towel Squeeze: AROM;Both;10 reps;Seated Hip ABduction/ADduction: Strengthening;Both;Seated;10 reps;Limitations Hip Abduction/Adduction Limitations: isometric with gait belt 10x3secH Hip Flexion/Marching: AAROM;Both;10 reps;Seated Knee Flexion: Left;10 reps;Seated;Limitations Knee Flexion Limitations: 10x3secH into towel roll Knee Extension: AROM;Both;15 reps;Seated Other Exercises Other Exercises: Elevated seated surface, STS with BUE pull/push on single anterior bed rail: 3x c sustained standing x20 seconds each (pt requires maxA at pelvis to rise, foot block)    General Comments        Pertinent Vitals/Pain Pain Assessment: No/denies pain    Home Living  Prior Function            PT Goals (current goals can now be found in the care plan section) Acute Rehab PT Goals Patient Stated Goal: return to home, maximize healing potential to allow for future prosthesis use PT Goal Formulation: With patient Time For Goal Achievement: 03/30/21 Potential to Achieve Goals: Fair Progress towards PT goals: Progressing toward goals    Frequency    7X/week      PT Plan Current plan remains appropriate    Co-evaluation              AM-PAC PT "6 Clicks" Mobility   Outcome Measure  Help needed turning from your back to your side while in a flat bed without using bedrails?: None Help needed moving from lying on your back to sitting on the side of a flat  bed without using bedrails?: None Help needed moving to and from a bed to a chair (including a wheelchair)?: A Little Help needed standing up from a chair using your arms (e.g., wheelchair or bedside chair)?: Total Help needed to walk in hospital room?: Total Help needed climbing 3-5 steps with a railing? : Total 6 Click Score: 14    End of Session Equipment Utilized During Treatment: Gait belt Activity Tolerance: Patient tolerated treatment well;No increased pain;Patient limited by fatigue Patient left: with call bell/phone within reach;in chair;with nursing/sitter in room Nurse Communication: Mobility status PT Visit Diagnosis: Difficulty in walking, not elsewhere classified (R26.2);Other abnormalities of gait and mobility (R26.89)     Time: 1130-1155 PT Time Calculation (min) (ACUTE ONLY): 25 min  Charges:  $Therapeutic Exercise: 23-37 mins                    3:41 PM, 03/25/21 Etta Grandchild, PT, DPT Physical Therapist - [facility]  [phone] ([place])     Azlyn Wingler C 03/25/2021, 3:39 PM

## 2021-03-25 NOTE — Progress Notes (Signed)
PROGRESS NOTE    Rebecca Park  EVO:[id number] DOB: [birth year] DOA: 03/13/2021 PCP: Marinda Elk, MD   Brief Narrative:  60 year old white female DM TY 2 on glipizide, HTN, Charcot foot with multiple prior amputations-4 months medial malleoli infection follows with Dr. Luana Shu of podiatry came to ED with nausea vomiting hyperglycemia--high-grade fevers)-prior to admission without much to eat or drink vomited multiple times- had not changed medial malleolar dressing for 10 days admitted for sepsis AKI with creatinine 2.1-cefepime vancomycin started   12/18 - Underwent BKA - found to also have MSSA bacteremia. Infectious disease , orthopedics , cardiology consulted and patient underwent TEE which did not show clear-cut evidence of vegetation   12/24 - will need IV cefazoline for 4 weeks - till 04/15/20 per ID, waiting for SNF 12/25: DC telemetry, stopping narcotics and stool softeners as she is having loose bowel movements.  Pending SNF placement 12/26- present: Waiting for SNF authorization   Assessment and Plan  Severe sepsis with acute organ dysfunction due to methicillin susceptible Staphylococcus aureus (MSSA) ([place])- (present on admission) Secondary to osteomyelitis -Continue 4 weeks of IV cefazoline per ID through 04/15/20 -AKI resolved -MSSA bacteremia - likely source DM foot infection/medial malleolus ulcer with underlying osteomyelitis and septic arthritis.  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 TEE - ? Very small vegetation on aortic valve. -Per ID cefazoline for 4 weeks - till 04/15/20.   HLD (hyperlipidemia)- (present on admission) -Continue lipitor.   White coat syndrome with diagnosis of hypertension- (present on admission) -Controlled.   Diabetes mellitus ([place]) -A1c 10.5, Semglee and SSI while here.   Subjective:  -No acute issues or events overnight   Objective Vital signs were reviewed and unremarkable.   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  Antimicrobials:  Cefazolin through 04/15/2020  Subjective: No acute issues or events overnight  Objective: Vitals:   03/24/21 2000 03/24/21 2303 03/25/21 0458 03/25/21 0734  BP: 112/61 (!) 105/53 105/65 121/71  Pulse: 85 84 84 91  Resp: _0 Temp: 98.7 F (37.1 C) 98.2 F (36.8 C) 97.9 F (36.6 C) 98.3 F (36.8 C)  TempSrc:      SpO2: 97% 93% 96% 94%  Weight:      Height:        Intake/Output Summary (Last 24 hours) at 03/25/2021 0738 Last data filed at 03/24/2021 1900 Gross per 24 hour  Intake 120 ml  Output 800 ml  Net -680 ml   Filed Weights   03/13/21 2339  Weight: 104.3 kg    Examination:  General:  Pleasantly resting in bed, No acute distress. HEENT:  Normocephalic atraumatic.  Sclerae nonicteric, noninjected.  Extraocular movements intact bilaterally. Neck:  Without mass or deformity.  Trachea is midline. Lungs:  Clear to auscultate bilaterally without rhonchi, wheeze, or rales. Heart:  Regular rate and rhythm.  Without murmurs, rubs, or gallops. Abdomen:  Soft, nontender, nondistended.  Without guarding or rebound. Extremities: Left BKA dressing clean dry intact  Vascular:  Dorsalis pedis and posterior tibial pulses palpable bilaterally. Skin:  Warm and dry, no erythema, no ulcerations.  Data Reviewed: I have personally reviewed following labs and imaging studies  CBC: Recent Labs  Lab 03/21/21 0535 03/22/21 0508  WBC 11.9* 13.1*  NEUTROABS 9.3*  --   HGB 8.8* 9.1*  HCT 27.7* 29.1*  MCV 86.3 85.1  PLT 177 431   Basic Metabolic Panel: Recent Labs  Lab 03/22/21 0508  NA 135  K 3.6  CL  100  CO2 30  GLUCOSE 200*  BUN 7  CREATININE 0.70  CALCIUM 7.6*   GFR: Estimated Creatinine Clearance: 91.3 mL/min (by C-G formula based on SCr of 0.7 mg/dL). Liver Function Tests: No results for input(s): AST, ALT, ALKPHOS, BILITOT, PROT, ALBUMIN in the last 168 hours. No results for input(s): LIPASE, AMYLASE in the last 168 hours. No results for input(s): AMMONIA in the last 168 hours. Coagulation Profile: No results for input(s): INR, PROTIME in the last 168 hours. Cardiac Enzymes: No results for input(s): CKTOTAL, CKMB, CKMBINDEX, TROPONINI in the last 168 hours. BNP (last 3 results) No results for input(s): PROBNP in the last 8760 hours. HbA1C: No results for input(s): HGBA1C in the last 72 hours. CBG: Recent Labs  Lab 03/24/21 0819 03/24/21 1147 03/24/21 1526 03/24/21 2022 03/25/21 0736  GLUCAP 140* 165* 149* 172* 132*   Lipid Profile: No results for input(s): CHOL, HDL, LDLCALC, TRIG, CHOLHDL, LDLDIRECT in the last 72 hours. Thyroid Function Tests: No results for input(s): TSH, T4TOTAL, FREET4, T3FREE, THYROIDAB in the last 72 hours. Anemia Panel: No results for input(s): VITAMINB12, FOLATE, FERRITIN, TIBC, IRON, RETICCTPCT in the last 72 hours. Sepsis Labs: No results for input(s): PROCALCITON, LATICACIDVEN in the last 168 hours.  No results found for this or any previous visit (from the past 240 hour(s)).       Radiology Studies: No results found.      Scheduled Meds:  atorvastatin  40 mg Oral Daily   Chlorhexidine Gluconate Cloth  6 each Topical Daily   enoxaparin (LOVENOX) injection  0.5 mg/kg Subcutaneous Q24H   insulin aspart  0-20 Units Subcutaneous TID AC & HS   insulin glargine-yfgn  8 Units Subcutaneous BID   insulin starter kit- pen needles  1 kit Other Once   multivitamin with minerals   Oral Daily   pantoprazole  40 mg Oral BID   senna  1 tablet Oral Daily   sodium chloride flush  10-40 mL Intracatheter Q12H   torsemide  10 mg Oral  Daily   Continuous Infusions:   ceFAZolin (ANCEF) IV 2 g (03/25/21 0505)   promethazine (PHENERGAN) injection (IM or IVPB) 12.5 mg (03/16/21 1626)     LOS: 11 days   Time spent: 35mn  Keyshawn Hellwig C Allizon Woznick, DO [facility]  If 7PM-7AM, please contact night-coverage [email or web address]  03/25/2021, 7:38 AM

## 2021-03-25 NOTE — Progress Notes (Signed)
OT Cancellation Note  Patient Details Name: Rebecca Park MRN: [id number] DOB: 1960/09/23   Cancelled Treatment:    Reason Eval/Treat Not Completed: Fatigue/lethargy limiting ability to participate. Pt supine in bed with wife present in the room and politely declined OT intervention. Pt reports working with PT and having just returned to bed and feeling very fatigued. OT will re-attempt when pt is next available.   Darleen Crocker, MS, OTR/L , CBIS ascom [phone]  03/25/21, 3:36 PM

## 2021-03-25 NOTE — TOC Progression Note (Signed)
Transition of Care [facility]) - Progression Note    Patient Details  Name: Rebecca Park MRN: [id number] Date of Birth: 07/09/1960  Transition of Care [facility]) CM/SW [place], RN Phone Number: 03/25/2021, 9:40 AM  Clinical Narrative:  Reached out to [name] at [place] [facility] to check the status of the [employer], Still pending, she will cal [facility] and follow up     Expected Discharge Plan: [place] Barriers to Discharge: Continued Medical Work up  Expected Discharge Plan and Services Expected Discharge Plan: [place]   Discharge Planning Services: CM Consult   Living arrangements for the past 2 months: Single Family Home                 DME Arranged: Youth worker wheelchair with seat cushion, 3-N-1 DME Agency: [employer] Date DME Agency Contacted: 03/17/21 Time DME Agency Contacted: 66 Representative spoke with at DME Agency: rhonda HH Arranged: PT, OT [place] Agency: [facility] Date [place]: 03/17/21 Time [place]: [place] Representative spoke with at [place]: [place] ([place]) Interventions    Readmission Risk Interventions No flowsheet data found.

## 2021-03-25 NOTE — TOC Progression Note (Addendum)
Transition of Care [facility]) - Progression Note    Patient Details  Name: TAGAN BARTRAM MRN: [id number] Date of Birth: [birth year]  Transition of Care [facility]) CM/SW Contact  Anselm Pancoast, RN Phone Number: 03/25/2021, 3:02 PM  Clinical Narrative:    LVMM for Tandy Gaw @ [employer] [phone] requesting assistance with possible SNF authorization.   Received call back from [place] reports she does not follow this plan and is unable to assist.    Expected Discharge Plan: [place] Barriers to Discharge: Insurance Authorization  Expected Discharge Plan and Services Expected Discharge Plan: [place]   Discharge Planning Services: CM Consult   Living arrangements for the past 2 months: Single Family Home                 DME Arranged: Youth worker wheelchair with seat cushion, 3-N-1 DME Agency: [employer] Date DME Agency Contacted: 03/17/21 Time DME Agency Contacted: 1007 Representative spoke with at DME Agency: rhonda HH Arranged: PT, OT HH Agency: [facility] Date [place]: 03/17/21 Time [place]: [place] Representative spoke with at [place]: [address] ([place]) Interventions    Readmission Risk Interventions No flowsheet data found.

## 2021-03-26 DIAGNOSIS — M86171 Other acute osteomyelitis, right ankle and foot: Secondary | ICD-10-CM | POA: Diagnosis not present

## 2021-03-26 LAB — GLUCOSE, CAPILLARY
Glucose-Capillary: 115 mg/dL — ABNORMAL HIGH (ref 70–99)
Glucose-Capillary: 151 mg/dL — ABNORMAL HIGH (ref 70–99)
Glucose-Capillary: 161 mg/dL — ABNORMAL HIGH (ref 70–99)
Glucose-Capillary: 146 mg/dL — ABNORMAL HIGH (ref 70–99)

## 2021-03-26 NOTE — Progress Notes (Signed)
Physical Therapy Treatment Patient Details Name: Rebecca Park MRN: [id number] DOB: 1960-11-25 Today's Date: 03/26/2021   History of Present Illness Rebecca Park is a 37yoF who comes to [facility] on 03/13/21 c N/V, hyperglycemia. Pt missed her OP FU with podiatry due to feeling ill for days. 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 Julianne Rice.    PT Comments    Pt in recliner, agreeable to session, remains motivated and focused. Continued to work on isolated hip and knee strength bilat, today incorporating ankle weight resistance on RLE and resistance band for Rt hip extension. Commenced education on WC transfers. Pt continues to excel with lateral scoot transfers with NSG including bed, recliner, and drop arm BSC, many performed today due to new ABX related stool frequency. Pt able to mobilize WC around unit, but takes 2 rest breaks for arm fatigue. Pt left in [place] with OT.   Recommendations for follow up therapy are one component of a multi-disciplinary discharge planning process, led by the attending physician.  Recommendations may be updated based on patient status, additional functional criteria and insurance authorization.  Follow Up Recommendations  Skilled nursing-short term rehab (<3 hours/day)     Assistance Recommended at Discharge Set up [place] Recommendations  Wheelchair (measurements PT);Wheelchair cushion (measurements PT) (drop arm BSC)    Recommendations for Other Services       Precautions / Restrictions Precautions Precautions: Fall Precaution Comments: new LLE BKA Restrictions Weight Bearing Restrictions: No     Mobility  Bed Mobility               General bed mobility comments: in recliner at entry, successful scoot  pivot transfer continues with NSG.    Transfers Overall transfer level: Needs assistance   Transfers: Bed to chair/wheelchair/BSC            Lateral/Scoot Transfers: Supervision;Min guard General transfer comment: education and setup for recliner to WC lateral scoot/squat pivot transfer. Author blocks WC for safety. No slide board used this date.    Ambulation/Gait                   Theme park manager mobility: Yes Wheelchair propulsion: Both upper extremities Wheelchair parts: Supervision/cueing Distance: 225  Modified Rankin (Stroke Patients Only)       Balance                                            Cognition Arousal/Alertness: Awake/alert Behavior During Therapy: WFL for tasks assessed/performed Overall Cognitive Status: Within Functional Limits for tasks assessed                                          Exercises Other Exercises Other Exercises: Seated RLE LAQ 1x15 c 4lb AW Other Exercises: Seated LLE knee extension 1x15 Other Exercises: Seated RLE hip extension and adduction againsts blue TB from end-range hip flexion 1x15    General Comments  Pertinent Vitals/Pain Pain Assessment: No/denies pain    Home Living                          Prior Function            PT Goals (current goals can now be found in the care plan section) Acute Rehab PT Goals Patient Stated Goal: return to home, maximize healing potential to allow for future prosthesis use PT Goal Formulation: With patient Time For Goal Achievement: 03/30/21 Potential to Achieve Goals: Fair Progress towards PT goals: Progressing toward goals    Frequency    7X/week      PT Plan Current plan remains appropriate    Co-evaluation              AM-PAC PT "6 Clicks" Mobility   Outcome Measure  Help needed turning from your back to your side while in a  flat bed without using bedrails?: None Help needed moving from lying on your back to sitting on the side of a flat bed without using bedrails?: None Help needed moving to and from a bed to a chair (including a wheelchair)?: A Little Help needed standing up from a chair using your arms (e.g., wheelchair or bedside chair)?: A Little Help needed to walk in hospital room?: Total Help needed climbing 3-5 steps with a railing? : Total 6 Click Score: 16    End of Session   Activity Tolerance: Patient tolerated treatment well;Patient limited by fatigue Patient left: with call bell/phone within reach;in chair;with nursing/sitter in room Nurse Communication: Mobility status PT Visit Diagnosis: Difficulty in walking, not elsewhere classified (R26.2);Other abnormalities of gait and mobility (R26.89)     Time: 2353-6144 PT Time Calculation (min) (ACUTE ONLY): 24 min  Charges:  $Gait Training: 8-22 mins $Therapeutic Exercise: 8-22 mins                    3:39 PM, 03/26/21 Etta Grandchild, PT, DPT Physical Therapist - [facility]  [phone] ([place])    [place] C 03/26/2021, 3:37 PM

## 2021-03-26 NOTE — Progress Notes (Signed)
Occupational Therapy Treatment Patient Details Name: NEETU CARROZZA MRN: [id number] DOB: [birth year] Today's Date: 03/26/2021   History of present illness Cinde Neace is a 74yoF who comes to [facility] on 03/13/21 c N/V, hyperglycemia. Pt missed her OP FU with podiatry due to feeling ill for days. 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 Julianne Rice.   OT comments  Ms Servidio was seen for OT treatment on this date. Upon arrival to room pt in w/c with PT, agreeable to tx. Pt requires CGA w/c>recliner with lateral scoot. MIN A don/doff mesh underwear with lateral leans - assist to pull over rear. Anticipate improvement at bed level - instructed on bridging method and supine rolling method. Left in chair family at bedside all needs in reach. Pt making good progress toward goals. Pt continues to benefit from skilled OT services to maximize return to PLOF and minimize risk of future falls, injury, caregiver burden, and readmission. Will continue to follow POC. Discharge recommendation remains appropriate.     Recommendations for follow up therapy are one component of a multi-disciplinary discharge planning process, led by the attending physician.  Recommendations may be updated based on patient status, additional functional criteria and insurance authorization.    Follow Up Recommendations  Follow physician's recommendations for discharge plan and follow up therapies    Assistance Recommended at Discharge Intermittent Supervision/Assistance  Equipment Recommendations  Wheelchair (measurements OT);Wheelchair cushion (measurements OT);Other (comment)    Recommendations for Other Services      Precautions / Restrictions Precautions Precautions: Fall Precaution Comments: new LLE  BKA Restrictions Weight Bearing Restrictions: No       Mobility Bed Mobility               General bed mobility comments: sitting beginning/end of session    Transfers Overall transfer level: Needs assistance   Transfers: Bed to chair/wheelchair/BSC            Lateral/Scoot Transfers: Min guard General transfer comment: w/c block for safety     Balance Overall balance assessment: Needs assistance Sitting-balance support: No upper extremity supported;Feet supported Sitting balance-Leahy Scale: Good                                     ADL either performed or assessed with clinical judgement   ADL Overall ADL's : Needs assistance/impaired                                       General ADL Comments: CGA for simulated BSC t/f. MIN A don/doff mesh underwear with lateral eans - assist to pull over rear. Anticipate improvement at bed level - instructed on bridging method and supine rolling method.      Cognition Arousal/Alertness: Awake/alert Behavior During Therapy: WFL for tasks assessed/performed Overall Cognitive Status: Within Functional Limits for tasks assessed                                            Exercises Other Exercises Other Exercises: Seated  RLE LAQ 1x15 c 4lb AW Other Exercises: Seated LLE knee extension 1x15 Other Exercises: Seated RLE hip extension and adduction againsts blue TB from end-range hip flexion 1x15           Pertinent Vitals/ Pain       Pain Assessment: No/denies pain   Frequency  Min 3X/week        Progress Toward Goals  OT Goals(current goals can now be found in the care plan section)  Progress towards OT goals: Progressing toward goals  Acute Rehab OT Goals Patient Stated Goal: to return to PLOF OT Goal Formulation: With patient/family Time For Goal Achievement: 03/31/21 Potential to Achieve Goals: Good ADL Goals Pt Will Perform Lower Body Dressing:  sitting/lateral leans;with modified independence Pt Will Transfer to Toilet: with min assist;stand pivot transfer;bedside commode Additional ADL Goal #1: Pt will demonstrate independence with learned L residual limb positioning and desensitization strategies to support preparation for future prosthetic.  Plan Discharge plan remains appropriate;Frequency remains appropriate    Co-evaluation                 AM-PAC OT "6 Clicks" Daily Activity     Outcome Measure   Help from another person eating meals?: None Help from another person taking care of personal grooming?: None Help from another person toileting, which includes using toliet, bedpan, or urinal?: A Lot Help from another person bathing (including washing, rinsing, drying)?: A Lot Help from another person to put on and taking off regular upper body clothing?: None Help from another person to put on and taking off regular lower body clothing?: A Lot 6 Click Score: 18    End of Session    OT Visit Diagnosis: Other abnormalities of gait and mobility (R26.89);Muscle weakness (generalized) (M62.81)   Activity Tolerance Patient tolerated treatment well   Patient Left in chair;with call bell/phone within reach;with chair alarm set   Nurse Communication Mobility status        Time: 4081-4481 OT Time Calculation (min): 16 min  Charges: OT General Charges $OT Visit: 1 Visit OT Treatments $Self Care/Home Management : 8-22 mins  Dessie Coma, M.S. OTR/L  03/26/21, 4:04 PM  ascom [phone]

## 2021-03-26 NOTE — TOC Progression Note (Signed)
Transition of Care [facility]) - Progression Note    Patient Details  Name: Rebecca Park MRN: [id number] Date of Birth: 1961/02/27  Transition of Care [facility]) CM/SW [place], RN Phone Number: 03/26/2021, 9:58 AM  Clinical Narrative:   Dillon Bjork still pending to go to [facility] once approved    Expected Discharge Plan: [place] Barriers to Discharge: Insurance Authorization  Expected Discharge Plan and Services Expected Discharge Plan: [place]   Discharge Planning Services: CM Consult   Living arrangements for the past 2 months: Single Family Home                 DME Arranged: Youth worker wheelchair with seat cushion, 3-N-1 DME Agency: [employer] Date DME Agency Contacted: 03/17/21 Time DME Agency Contacted: [id number] Representative spoke with at DME Agency: rhonda HH Arranged: PT, OT [place] Agency: [facility] Date [place]: 03/17/21 Time [place]: [id number] Representative spoke with at [place]: [place] ([place]) Interventions    Readmission Risk Interventions No flowsheet data found.

## 2021-03-26 NOTE — Progress Notes (Signed)
PROGRESS NOTE    Rebecca Park  WFU:[id number] DOB: 25-Dec-1960 DOA: 03/13/2021 PCP: Marinda Elk, MD   Brief Narrative:  60 year old white female DM TY 2 on glipizide, HTN, Charcot foot with multiple prior amputations-4 months medial malleoli infection follows with Dr. Luana Shu of podiatry came to ED with nausea vomiting hyperglycemia--high-grade fevers)-prior to admission without much to eat or drink vomited multiple times- had not changed medial malleolar dressing for 10 days admitted for sepsis AKI with creatinine 2.1-cefepime vancomycin started   12/18 - Underwent BKA - found to also have MSSA bacteremia. Infectious disease , orthopedics , cardiology consulted and patient underwent TEE which did not show clear-cut evidence of vegetation   12/24 - will need IV cefazoline for 4 weeks - till 04/15/20 per ID, waiting for SNF 12/25: DC telemetry, stopping narcotics and stool softeners as she is having loose bowel movements.  Pending SNF placement 12/26- present: Waiting for SNF authorization    Assessment and Plan  Severe sepsis with acute organ dysfunction due to methicillin susceptible Staphylococcus aureus (MSSA) ([place])- (present on admission) Secondary to osteomyelitis -Continue 4 weeks of IV cefazoline per ID through 04/15/20 -AKI resolved -MSSA bacteremia - likely source DM foot infection/medial malleolus ulcer with underlying osteomyelitis and septic arthritis.  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 TEE - ? Very small vegetation on aortic valve. -Per ID cefazoline for 4 weeks - till 04/15/20.   HLD (hyperlipidemia)- (present on admission) -Continue lipitor.   White coat syndrome with diagnosis of hypertension- (present on admission) -Controlled.   Diabetes mellitus (Rebecca Park) -A1c 10.5, Semglee and SSI while here.   Subjective:  -No acute issues or events overnight   Objective Vital signs were reviewed and unremarkable.   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  Antimicrobials:  Cefazolin through 04/15/2020  Subjective: No acute issues or events overnight  Objective: Vitals:   03/25/21 2036 03/25/21 2328 03/26/21 0433 03/26/21 0737  BP: (!) 99/59 99/64 (!) 108/47 132/69  Pulse: 83 84 86 87  Resp: _0 Temp: 98.2 F (36.8 C) 98.8 F (37.1 C) 98.6 F (37 C) 98.3 F (36.8 C)  TempSrc:      SpO2: 96% 95% 93% 96%  Weight:      Height:        Intake/Output Summary (Last 24 hours) at 03/26/2021 0750 Last data filed at 03/26/2021 0511 Gross per 24 hour  Intake 425.62 ml  Output 900 ml  Net -474.38 ml    Filed Weights   03/13/21 2339  Weight: 104.3 kg    Examination:  General:  Pleasantly resting in bed, No acute distress. HEENT:  Normocephalic atraumatic.  Sclerae nonicteric, noninjected.  Extraocular movements intact bilaterally. Neck:  Without mass or deformity.  Trachea is midline. Lungs:  Clear to auscultate bilaterally without rhonchi, wheeze, or rales. Heart:  Regular rate and rhythm.  Without murmurs, rubs, or gallops. Abdomen:  Soft, nontender, nondistended.  Without guarding or rebound. Extremities: Left BKA dressing  clean dry intact Vascular:  Dorsalis pedis and posterior tibial pulses palpable bilaterally. Skin:  Warm and dry, no erythema, no ulcerations.  Data Reviewed: I have personally reviewed following labs and imaging studies  CBC: Recent Labs  Lab 03/21/21 0535 03/22/21 0508  WBC 11.9* 13.1*  NEUTROABS 9.3*  --   HGB 8.8* 9.1*  HCT 27.7* 29.1*  MCV 86.3 85.1  PLT 177 342    Basic Metabolic Panel: Recent Labs  Lab 03/22/21 0508  NA 135  K  3.6  CL 100  CO2 30  GLUCOSE 200*  BUN 7  CREATININE 0.70  CALCIUM 7.6*    GFR: Estimated Creatinine Clearance: 91.3 mL/min (by C-G formula based on SCr of 0.7 mg/dL). Liver Function Tests: No results for input(s): AST, ALT, ALKPHOS, BILITOT, PROT, ALBUMIN in the last 168 hours. No results for input(s): LIPASE, AMYLASE in the last 168 hours. No results for input(s): AMMONIA in the last 168 hours. Coagulation Profile: No results for input(s): INR, PROTIME in the last 168 hours. Cardiac Enzymes: No results for input(s): CKTOTAL, CKMB, CKMBINDEX, TROPONINI in the last 168 hours. BNP (last 3 results) No results for input(s): PROBNP in the last 8760 hours. HbA1C: No results for input(s): HGBA1C in the last 72 hours. CBG: Recent Labs  Lab 03/24/21 2022 03/25/21 0736 03/25/21 1132 03/25/21 2035 03/26/21 0733  GLUCAP 172* 132* 154* 168* 115*    Lipid Profile: No results for input(s): CHOL, HDL, LDLCALC, TRIG, CHOLHDL, LDLDIRECT in the last 72 hours. Thyroid Function Tests: No results for input(s): TSH, T4TOTAL, FREET4, T3FREE, THYROIDAB in the last 72 hours. Anemia Panel: No results for input(s): VITAMINB12, FOLATE, FERRITIN, TIBC, IRON, RETICCTPCT in the last 72 hours. Sepsis Labs: No results for input(s): PROCALCITON, LATICACIDVEN in the last 168 hours.  No results found for this or any previous visit (from the past 240 hour(s)).       Radiology Studies: No results found.      Scheduled Meds:  atorvastatin  40 mg Oral Daily   Chlorhexidine Gluconate Cloth  6 each Topical Daily   enoxaparin (LOVENOX) injection  0.5 mg/kg Subcutaneous Q24H   insulin aspart  0-20 Units Subcutaneous TID AC & HS   insulin glargine-yfgn  8 Units Subcutaneous BID   insulin starter kit- pen needles  1 kit Other Once   multivitamin with minerals   Oral Daily   pantoprazole  40 mg Oral BID   senna  1 tablet Oral Daily   sodium chloride flush  10-40 mL Intracatheter Q12H   torsemide   10 mg Oral Daily   Continuous Infusions:   ceFAZolin (ANCEF) IV 2 g (03/26/21 0503)   promethazine (PHENERGAN) injection (IM or IVPB) 12.5 mg (03/16/21 1626)     LOS: 12 days   Time spent: 25mn  Rona Tomson C Gabrielly Mccrystal, DO [facility]  If 7PM-7AM, please contact night-coverage [email or web address]  03/26/2021, 7:50 AM

## 2021-03-27 DIAGNOSIS — E11621 Type 2 diabetes mellitus with foot ulcer: Secondary | ICD-10-CM | POA: Diagnosis not present

## 2021-03-27 DIAGNOSIS — E782 Mixed hyperlipidemia: Secondary | ICD-10-CM | POA: Diagnosis not present

## 2021-03-27 DIAGNOSIS — E11610 Type 2 diabetes mellitus with diabetic neuropathic arthropathy: Secondary | ICD-10-CM | POA: Diagnosis not present

## 2021-03-27 DIAGNOSIS — Z89512 Acquired absence of left leg below knee: Secondary | ICD-10-CM | POA: Diagnosis not present

## 2021-03-27 DIAGNOSIS — E1165 Type 2 diabetes mellitus with hyperglycemia: Secondary | ICD-10-CM | POA: Diagnosis not present

## 2021-03-27 DIAGNOSIS — M6281 Muscle weakness (generalized): Secondary | ICD-10-CM | POA: Diagnosis not present

## 2021-03-27 DIAGNOSIS — E1121 Type 2 diabetes mellitus with diabetic nephropathy: Secondary | ICD-10-CM

## 2021-03-27 DIAGNOSIS — R29898 Other symptoms and signs involving the musculoskeletal system: Secondary | ICD-10-CM | POA: Diagnosis not present

## 2021-03-27 DIAGNOSIS — M86171 Other acute osteomyelitis, right ankle and foot: Secondary | ICD-10-CM | POA: Diagnosis not present

## 2021-03-27 DIAGNOSIS — D649 Anemia, unspecified: Secondary | ICD-10-CM | POA: Diagnosis not present

## 2021-03-27 DIAGNOSIS — B3731 Acute candidiasis of vulva and vagina: Secondary | ICD-10-CM | POA: Diagnosis not present

## 2021-03-27 DIAGNOSIS — E11628 Type 2 diabetes mellitus with other skin complications: Secondary | ICD-10-CM | POA: Diagnosis not present

## 2021-03-27 DIAGNOSIS — M14679 Charcot's joint, unspecified ankle and foot: Secondary | ICD-10-CM | POA: Diagnosis not present

## 2021-03-27 DIAGNOSIS — L089 Local infection of the skin and subcutaneous tissue, unspecified: Secondary | ICD-10-CM | POA: Diagnosis not present

## 2021-03-27 DIAGNOSIS — R0902 Hypoxemia: Secondary | ICD-10-CM | POA: Diagnosis not present

## 2021-03-27 DIAGNOSIS — R7881 Bacteremia: Secondary | ICD-10-CM | POA: Diagnosis not present

## 2021-03-27 DIAGNOSIS — A4101 Sepsis due to Methicillin susceptible Staphylococcus aureus: Secondary | ICD-10-CM | POA: Diagnosis not present

## 2021-03-27 DIAGNOSIS — N179 Acute kidney failure, unspecified: Secondary | ICD-10-CM | POA: Diagnosis not present

## 2021-03-27 DIAGNOSIS — B9561 Methicillin susceptible Staphylococcus aureus infection as the cause of diseases classified elsewhere: Secondary | ICD-10-CM | POA: Diagnosis not present

## 2021-03-27 DIAGNOSIS — I959 Hypotension, unspecified: Secondary | ICD-10-CM | POA: Diagnosis not present

## 2021-03-27 DIAGNOSIS — A419 Sepsis, unspecified organism: Secondary | ICD-10-CM | POA: Diagnosis not present

## 2021-03-27 DIAGNOSIS — E119 Type 2 diabetes mellitus without complications: Secondary | ICD-10-CM | POA: Diagnosis not present

## 2021-03-27 DIAGNOSIS — R531 Weakness: Secondary | ICD-10-CM | POA: Diagnosis not present

## 2021-03-27 DIAGNOSIS — M009 Pyogenic arthritis, unspecified: Secondary | ICD-10-CM | POA: Diagnosis not present

## 2021-03-27 DIAGNOSIS — Z792 Long term (current) use of antibiotics: Secondary | ICD-10-CM | POA: Diagnosis not present

## 2021-03-27 DIAGNOSIS — R5381 Other malaise: Secondary | ICD-10-CM | POA: Diagnosis not present

## 2021-03-27 DIAGNOSIS — Z7401 Bed confinement status: Secondary | ICD-10-CM | POA: Diagnosis not present

## 2021-03-27 DIAGNOSIS — R2689 Other abnormalities of gait and mobility: Secondary | ICD-10-CM | POA: Diagnosis not present

## 2021-03-27 DIAGNOSIS — E785 Hyperlipidemia, unspecified: Secondary | ICD-10-CM | POA: Diagnosis not present

## 2021-03-27 DIAGNOSIS — M869 Osteomyelitis, unspecified: Secondary | ICD-10-CM | POA: Diagnosis not present

## 2021-03-27 DIAGNOSIS — Z4781 Encounter for orthopedic aftercare following surgical amputation: Secondary | ICD-10-CM | POA: Diagnosis not present

## 2021-03-27 DIAGNOSIS — I10 Essential (primary) hypertension: Secondary | ICD-10-CM | POA: Diagnosis not present

## 2021-03-27 DIAGNOSIS — R6520 Severe sepsis without septic shock: Secondary | ICD-10-CM | POA: Diagnosis not present

## 2021-03-27 LAB — BASIC METABOLIC PANEL
Anion gap: 8 (ref 5–15)
BUN: 10 mg/dL (ref 6–20)
CO2: 32 mmol/L (ref 22–32)
Calcium: 8 mg/dL — ABNORMAL LOW (ref 8.9–10.3)
Chloride: 97 mmol/L — ABNORMAL LOW (ref 98–111)
Creatinine, Ser: 0.74 mg/dL (ref 0.44–1.00)
GFR, Estimated: >60 mL/min (ref >60)
Glucose, Bld: 122 mg/dL — ABNORMAL HIGH (ref 70–99)
Potassium: 3.5 mmol/L (ref 3.5–5.1)
Sodium: 137 mmol/L (ref 135–145)

## 2021-03-27 LAB — GLUCOSE, CAPILLARY
Glucose-Capillary: 117 mg/dL — ABNORMAL HIGH (ref 70–99)
Glucose-Capillary: 153 mg/dL — ABNORMAL HIGH (ref 70–99)

## 2021-03-27 LAB — RESP PANEL BY RT-PCR (FLU A&B, COVID) ARPGX2
Influenza A by PCR: NEGATIVE
Influenza B by PCR: NEGATIVE
SARS Coronavirus 2 by RT PCR: NEGATIVE

## 2021-03-27 MED ORDER — CEFAZOLIN IV (FOR PTA / DISCHARGE USE ONLY): 2.0000 g | Freq: Three times a day (TID) | INTRAVENOUS | 0 refills | Status: AC

## 2021-03-27 MED ORDER — TORSEMIDE 10 MG PO TABS: 10.0000 mg | ORAL_TABLET | Freq: Every day | ORAL | 0 refills | Status: DC

## 2021-03-27 MED ORDER — PANTOPRAZOLE SODIUM 40 MG PO TBEC: 40.0000 mg | DELAYED_RELEASE_TABLET | Freq: Two times a day (BID) | ORAL | 0 refills | Status: DC

## 2021-03-27 NOTE — Progress Notes (Signed)
PT Cancellation Note  Patient Details Name: Rebecca Park MRN: [id number] DOB: January 23, 1961   Cancelled Treatment:    Reason Eval/Treat Not Completed: Other (comment) (Pt has a DC plan in process, leaving today.) Pt politely declines PT this date as she is preparing her belonging and thoughts for DC to rehab.   2:38 PM, 03/27/21 Etta Grandchild, PT, DPT Physical Therapist - [facility]  [phone] ([place])    [place] C 03/27/2021, 2:38 PM

## 2021-03-27 NOTE — TOC Progression Note (Addendum)
Transition of Care [facility]) - Progression Note    Patient Details  Name: Rebecca Park MRN: [id number] Date of Birth: 08-31-1960  Transition of Care [facility] CM/SW [place], LCSW Phone Number: 03/27/2021, 8:36 AM  Clinical Narrative:    CSW reached out to [name] at [facility], asked for update regarding insurance auth.  11:45- [name] confirmed [employer] has insurance auth and patient can come today pending COVID test. Asked MD and RN to order.  12:40- Updated patient on DC to [facility] today. Patient will be in Room 518-1.   Expected Discharge Plan: Skilled Nursing Facility Barriers to Discharge: Insurance Authorization  Expected Discharge Plan and Services Expected Discharge Plan: [place]   Discharge Planning Services: CM Consult   Living arrangements for the past 2 months: Single Family Home                 DME Arranged: Youth worker wheelchair with seat cushion, 3-N-1 DME Agency: [employer] Date DME Agency Contacted: 03/17/21 Time DME Agency Contacted: 1537 Representative spoke with at DME Agency: rhonda HH Arranged: PT, OT [place] Agency: [facility] Date [place]: 03/17/21 Time [place]: 9432 Representative spoke with at [place]: [place] ([place]) Interventions    Readmission Risk Interventions No flowsheet data found.

## 2021-03-27 NOTE — Progress Notes (Deleted)
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 Luana Shu of podiatry came to ED with nausea vomiting hyperglycemia--high-grade fevers)-prior to admission without much to eat or drink vomited multiple times- had not changed medial malleolar dressing for 10 days admitted for sepsis AKI with creatinine 2.1-cefepime vancomycin started   12/18 - Underwent BKA - found to also have MSSA bacteremia. Infectious disease , orthopedics , cardiology consulted and patient underwent TEE which did not show clear-cut evidence of vegetation   12/24 - will need IV cefazoline for 4 weeks - till 04/15/20 per ID, waiting for SNF 12/25: DC telemetry, stopping narcotics and stool softeners as she is having loose bowel movements.  Pending SNF placement 12/26- present: Waiting for SNF authorization; Patient remains medically stable for discharge, no acute issues/events overnight   Assessment and Plan  Severe sepsis with acute organ dysfunction due to methicillin susceptible Staphylococcus aureus (MSSA) ([place])- (present on admission) Secondary to osteomyelitis -Continue 4 weeks of IV cefazoline per ID through 04/15/20 -AKI resolved -MSSA bacteremia - likely source DM foot infection/medial malleolus ulcer with underlying osteomyelitis and septic arthritis.  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 TEE - ? Very small vegetation on aortic valve. -Per ID cefazoline for 4 weeks - till 04/15/20.   HLD (hyperlipidemia)- (present on admission) -Continue lipitor.   White coat syndrome with diagnosis of hypertension- (present on admission) -Controlled.   Diabetes mellitus ([place]) -A1c 10.5, Semglee and SSI while here.   Subjective:  -No acute issues or events overnight   Objective Vital signs were reviewed  and unremarkable.   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  Antimicrobials:  Cefazolin through 04/15/2020  Subjective: No acute issues or events overnight  Objective: Vitals:   03/26/21 1407 03/26/21 1949 03/26/21 2339 03/27/21 0216  BP: (!) 99/58 111/61 (!) 109/58 (!) 102/56  Pulse: 86 82 80 80  Resp: '16 14 16 15  ' Temp: 97.8 F (36.6 C)  98.2 F (36.8 C) 98.4 F (36.9 C)  TempSrc:      SpO2: 95% 97% 95% 96%  Weight:      Height:        Intake/Output Summary (Last 24 hours) at 03/27/2021 0713 Last data filed at 03/27/2021 0430 Gross per 24 hour  Intake 754.44 ml  Output 800 ml  Net -45.56 ml    Filed Weights   03/13/21 2339  Weight: 104.3 kg    Examination:  General:  Pleasantly resting in bed, No acute distress. HEENT:  Normocephalic atraumatic.  Sclerae nonicteric, noninjected.  Extraocular movements intact bilaterally. Neck:  Without mass or deformity.  Trachea is midline. Lungs:  Clear to auscultate bilaterally without rhonchi, wheeze, or rales. Heart:  Regular rate and rhythm.  Without murmurs, rubs, or gallops. Abdomen:  Soft, nontender, nondistended.  Without  guarding or rebound. Extremities: Left BKA dressing clean dry intact Vascular:  Dorsalis pedis and posterior tibial pulses palpable bilaterally. Skin:  Warm and dry, no erythema, no ulcerations.  Data Reviewed: I have personally reviewed following labs and imaging studies  CBC: Recent Labs  Lab 03/21/21 0535 03/22/21 0508  WBC 11.9* 13.1*  NEUTROABS 9.3*  --   HGB 8.8* 9.1*  HCT 27.7* 29.1*  MCV 86.3 85.1  PLT 177 201    Basic  Metabolic Panel: Recent Labs  Lab 03/22/21 0508 03/27/21 0605  NA 135 137  K 3.6 3.5  CL 100 97*  CO2 30 32  GLUCOSE 200* 122*  BUN 7 10  CREATININE 0.70 0.74  CALCIUM 7.6* 8.0*    GFR: Estimated Creatinine Clearance: 91.3 mL/min (by C-G formula based on SCr of 0.74 mg/dL). Liver Function Tests: No results for input(s): AST, ALT, ALKPHOS, BILITOT, PROT, ALBUMIN in the last 168 hours. No results for input(s): LIPASE, AMYLASE in the last 168 hours. No results for input(s): AMMONIA in the last 168 hours. Coagulation Profile: No results for input(s): INR, PROTIME in the last 168 hours. Cardiac Enzymes: No results for input(s): CKTOTAL, CKMB, CKMBINDEX, TROPONINI in the last 168 hours. BNP (last 3 results) No results for input(s): PROBNP in the last 8760 hours. HbA1C: No results for input(s): HGBA1C in the last 72 hours. CBG: Recent Labs  Lab 03/25/21 2035 03/26/21 0733 03/26/21 1134 03/26/21 1645 03/26/21 2051  GLUCAP 168* 115* 151* 161* 146*    Lipid Profile: No results for input(s): CHOL, HDL, LDLCALC, TRIG, CHOLHDL, LDLDIRECT in the last 72 hours. Thyroid Function Tests: No results for input(s): TSH, T4TOTAL, FREET4, T3FREE, THYROIDAB in the last 72 hours. Anemia Panel: No results for input(s): VITAMINB12, FOLATE, FERRITIN, TIBC, IRON, RETICCTPCT in the last 72 hours. Sepsis Labs: No results for input(s): PROCALCITON, LATICACIDVEN in the last 168 hours.  No results found for this or any previous visit (from the past 240 hour(s)).       Radiology Studies: No results found.      Scheduled Meds:  atorvastatin  40 mg Oral Daily   Chlorhexidine Gluconate Cloth  6 each Topical Daily   enoxaparin (LOVENOX) injection  0.5 mg/kg Subcutaneous Q24H   insulin aspart  0-20 Units Subcutaneous TID AC & HS   insulin glargine-yfgn  8 Units Subcutaneous BID   insulin starter kit- pen needles  1 kit Other Once   multivitamin with minerals   Oral Daily   pantoprazole   40 mg Oral BID   senna  1 tablet Oral Daily   sodium chloride flush  10-40 mL Intracatheter Q12H   torsemide  10 mg Oral Daily   Continuous Infusions:   ceFAZolin (ANCEF) IV 2 g (03/27/21 0529)   promethazine (PHENERGAN) injection (IM or IVPB) 12.5 mg (03/16/21 1626)     LOS: 13 days   Time spent: 39mn  Hyman Crossan C Bristyn Kulesza, DO [facility]  If 7PM-7AM, please contact night-coverage [email or web address]  03/27/2021, 7:13 AM

## 2021-03-27 NOTE — Discharge Summary (Signed)
Physician Discharge Summary  Rebecca Park XKG:[id number] DOB: 07-13-1960 DOA: 03/13/2021  PCP: Marinda Elk, MD  Admit date: 03/13/2021 Discharge date: 03/27/2021  Admitted From: Home Disposition: SNF  Recommendations for Outpatient Follow-up:  Follow up with PCP in 1-2 weeks Please obtain BMP/CBC in one week Please follow up with infectious disease as scheduled  Discharge Condition: Stable CODE STATUS: Full Diet recommendation: Low-salt low-fat diabetic diet  Brief/Interim Summary: 60 year old white female DM TY 2 on glipizide, HTN, Charcot foot with multiple prior amputations-4 months medial malleoli infection follows with Dr. Luana Shu of podiatry came to ED with nausea vomiting hyperglycemia--high-grade fevers)-prior to admission without much to eat or drink vomited multiple times- had not changed medial malleolar dressing for 10 days admitted for sepsis AKI with creatinine 2.1-cefepime vancomycin started   12/18 - Underwent BKA - found to also have MSSA bacteremia. Infectious disease , orthopedics , cardiology consulted and patient underwent TEE which did not show clear-cut evidence of vegetation   12/24 - will need IV cefazoline for 4 weeks - till 04/15/20 per ID, waiting for SNF 12/25: DC telemetry, stopping narcotics and stool softeners as she is having loose bowel movements.  Pending SNF placement 12/26- present: Waiting for SNF authorization; Patient remains medically stable for discharge, no acute issues/events overnight    Assessment and Plan   Severe sepsis with acute organ dysfunction due to methicillin susceptible Staphylococcus aureus (MSSA) ([place])- (present on admission) Secondary to osteomyelitis -Continue 4 weeks of IV cefazoline per ID through 04/15/20 -AKI resolved -MSSA bacteremia - likely source DM foot infection/medial malleolus ulcer with underlying osteomyelitis and septic arthritis.  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 TEE - ? Very small vegetation on aortic  valve. -Per ID cefazoline for 4 weeks - till 04/15/20.   HLD (hyperlipidemia)- (present on admission) -Continue lipitor.   White coat syndrome with diagnosis of hypertension- (present on admission) -Controlled.   Diabetes mellitus ([place]) -A1c 10.5, resume home Trulicity -lengthy discussion about dietary improvement  Discharge Instructions  Discharge Instructions     [facility] pharmacist to adjust dose for Vancomycin, Aminoglycosides and other anti-infective therapies as requested by physician.   Complete by: As directed    [facility] to provide Cath Flo 78m   Complete by: As directed    Administer for PICC line occlusion and as ordered by physician for other access device issues.   Anaphylaxis Kit: Provided to treat any anaphylactic reaction to the medication being provided to the patient if First Dose or when requested by physician   Complete by: As directed    Epinephrine 111mml vial / amp: Administer 0.72m67m0.72ml27mubcutaneously once for moderate to severe anaphylaxis, nurse to call physician and pharmacy when reaction occurs and call 911 if needed for immediate care   Diphenhydramine 50mg372mIV vial: Administer 25-50mg 46mM PRN for first dose reaction, rash, itching, mild reaction, nurse to call physician and pharmacy when reaction occurs   Sodium Chloride 0.9% NS 500ml I21mdminister if needed for hypovolemic blood pressure drop or as ordered by physician after call to physician with anaphylactic reaction   Change dressing on IV access line weekly and PRN   Complete by: As directed    Flush IV access with Sodium Chloride 0.9% and Heparin 10 units/ml or 100 units/ml   Complete by: As directed    Home infusion instructions - [facility]   Complete by: As directed    Instructions: Flush IV access with Sodium Chloride 0.9% and Heparin  10units/ml or 100units/ml   Change dressing on IV access line: Weekly and PRN   Instructions Cath Flo 20m:  Administer for PICC Line occlusion and as ordered by physician for other access device   [facility] pharmacist to adjust dose for: Vancomycin, Aminoglycosides and other anti-infective therapies as requested by physician   Method of administration may be changed at the discretion of home infusion pharmacist based upon assessment of the patient and/or caregivers ability to self-administer the medication ordered   Complete by: As directed       Allergies as of 03/27/2021   No Known Allergies      Medication List     STOP taking these medications    amLODipine 5 MG tablet Commonly known as: NORVASC   glipiZIDE 10 MG tablet Commonly known as: GLUCOTROL   losartan 100 MG tablet Commonly known as: COZAAR       TAKE these medications    atorvastatin 40 MG tablet Commonly known as: LIPITOR TAKE 1 TABLET BY MOUTH EVERY DAY   ceFAZolin  IVPB Commonly known as: ANCEF Inject 2 g into the vein every 8 (eight) hours for 28 days. Indication:  MSSA bacteremia and foot infection.? Lambls excrescence VS aortic valve small vegetation First Dose: Yes Last Day of Therapy:  04/15/2021 Labs - Once weekly on Tuesdays:  CBC/D, CMP, ESR Please pull PIC at completion of IV antibiotics Fax weekly labs to ([phone] of administration: IV Push Method of administration may be changed at the discretion of home infusion pharmacist based upon assessment of the patient and/or caregiver's ability to self-administer the medication ordered.   Lancets 30G Misc 1 each by Does not apply route 2 (two) times daily.   OneTouch Ultra test strip Generic drug: glucose blood 1 EACH BY OTHER ROUTE 2 (TWO) TIMES DAILY.   pantoprazole 40 MG tablet Commonly known as: PROTONIX Take 1 tablet (40 mg total) by mouth 2 (two) times daily.   Santyl ointment Generic drug: collagenase Apply 1 application topically daily as needed.   torsemide 10 MG tablet Commonly known as: DEMADEX Take 1  tablet (10 mg total) by mouth daily. Start taking on: March 28, 2021 What changed:  medication strength how much to take   Trulicity 1.5 MYI/0.1KPSopn Generic drug: Dulaglutide Inject 0.5 mLs into the skin once a week.   WOMENS MULTIVITAMIN PO Take 1 tablet by mouth daily.               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  A walking aide will not resolve issue with performing activities of daily living. A wheelchair will allow patient to safely perform daily activities. Patient can safely propel the wheelchair in the home or has a caregiver who can provide assistance. Length of need 6 months.Accessories: elevating leg rests (ELRs), wheel locks, extensions, back cushion and anti-tippers.   03/17/21 1051              Discharge Care Instructions  (From admission,  onward)           Start     Ordered   03/27/21 0000  Change dressing on IV access line weekly and PRN  (Home infusion instructions - [facility] )        03/27/21 1149            No Known Allergies  Consultations: Infectious disease, vascular surgery  Procedures/Studies: DG Abd 1 View  Result Date: 03/15/2021 CLINICAL DATA:  Nausea and vomiting EXAM: ABDOMEN - 1 VIEW COMPARISON:  None. FINDINGS: The bowel gas pattern is normal. No abnormal stool retention. Coarse calcification over the sacrum, usually uterine fibroid. No concerning mass effect or gas collection. IMPRESSION: Normal bowel gas pattern. Electronically Signed   By: Jorje Guild M.D.   On: 03/15/2021 07:38   MR ANKLE LEFT WO CONTRAST  Result Date: 03/14/2021 CLINICAL DATA:   Osteomyelitis EXAM: MRI OF THE LEFT ANKLE WITHOUT CONTRAST TECHNIQUE: Multiplanar, multisequence MR imaging of the ankle was performed. No intravenous contrast was administered. COMPARISON:  Same-day foot radiograph FINDINGS: Bones/Joint/Cartilage There is subtalar malalignment with medially rotated talus consistent with Charcot arthropathy. Subtalar joint effusion. There is bony edema and some low T1 signal in the medial talar head. There is additional periarticular bony edema throughout the hindfoot and midfoot favored to be related to neuropathic arthropathy. Subacute nondisplaced fracture of the talar dome, vertically oriented, extending to the tibiotalar and subtalar joints (sagittal T1 image 10, coronal T2 image 20-22. Mild tibiotalar chondrosis. Ligaments Chronic ATFL sprain/partial tear. the PTFL is intact. No structure resembling the calcaneofibular ligament is seen. Chronic deltoid and spring ligament tearing. These findings are consistent with Charcot arthropathy. Muscles and Tendons There is tenosynovitis of the peroneal tendons above and below the lateral malleolus. There is tenosynovitis and tendinosis of the posterior tibial tendon above and below the medial malleolus. There is thickening of the central bundle plantar fascia with mildly increased signal. The Achilles tendon is intact. Soft tissue There is a soft tissue ulcer along the medial midfoot with underlying signal abnormality in the talar head. Extensive soft tissue swelling. IMPRESSION: Soft tissue ulcer along the medial midfoot with underlying probable osteomyelitis in the medial talar head. Charcot arthropathy with subtalar malalignment and associated periarticular marrow edema. Subacute nondisplaced vertically oriented talar dome fracture. Large subtalar joint effusion, likely related to chronic joint malalignment/neuropathic arthropathy and the talar fracture. Sterility is indeterminate on MRI. Tenosynovitis of the posterior tibial tendon  above and below the medial malleolus. Tenosynovitis of the peroneal tendons above and below the lateral malleolus. Electronically Signed   By: Maurine Simmering M.D.   On: 03/14/2021 12:08   DG Chest Port 1 View  Result Date: 03/14/2021 CLINICAL DATA:  Possible sepsis EXAM: PORTABLE CHEST 1 VIEW COMPARISON:  None. FINDINGS: Cardiac shadow is within normal limits. The lungs are hypoinflated with mild right basilar atelectasis. Elevation of the right hemidiaphragm is noted. Bony structures are within normal limits. IMPRESSION: Mild right basilar atelectasis. Electronically Signed   By: Inez Catalina M.D.   On: 03/14/2021 00:40   DG Foot 2 Views Left  Addendum Date: 03/14/2021   ADDENDUM REPORT: 03/14/2021 00:46 ADDENDUM: Additionally along the medial aspect of the talus there is some suggestion of bony resorption which given the known overlying soft tissue wound may be related to osteomyelitis. MRI may be helpful for further evaluation. Electronically Signed   By: Inez Catalina M.D.   On: 03/14/2021 00:46   Result Date: 03/14/2021 CLINICAL DATA:  Possible sepsis, initial encounter EXAM: LEFT FOOT - 2 VIEW COMPARISON:  None. FINDINGS: Postsurgical changes are noted in the first metatarsal. Degenerative changes in the first MTP joint are seen. Some widening of the talonavicular joint is seen with flattening of the plantar arch identified. These are likely related to more chronic changes possibly related to Charcot deformity. No acute fracture is seen. Mild soft tissue swelling is noted distally. IMPRESSION: Chronic appearing changes particularly at the talonavicular joint suggestive of early Charcot joint changes. No acute fracture is noted. Electronically Signed: By: Inez Catalina M.D. On: 03/14/2021 00:42   ECHOCARDIOGRAM COMPLETE  Result Date: 03/16/2021    ECHOCARDIOGRAM REPORT   Patient Name:   Rebecca Park Date of Exam: 03/16/2021 Medical Rec #:  [id number]      Height:       66.0 in Accession #:     [id number]     Weight:       230.0 lb Date of Birth:  1960-09-27       BSA:          2.122 m Patient Age:    68 years       BP:           98/64 mmHg Patient Gender: F              HR:           113 bpm. Exam Location:  [facility] Procedure: 2D Echo, Color Doppler and Cardiac Doppler Indications:     R78.81 Bacteremia  History:         Patient has prior history of Echocardiogram examinations. Risk                  Factors:Hypertension and Diabetes.  Sonographer:     Charmayne Sheer Referring Phys:  [id number] Zara Chess Diagnosing Phys: Kathlyn Sacramento MD  Sonographer Comments: Technically difficult study due to poor echo windows and no subcostal window. Image acquisition challenging due to patient body habitus. IMPRESSIONS  1. Left ventricular ejection fraction, by estimation, is 55 to 60%. The left ventricle has normal function. Left ventricular endocardial border not optimally defined to evaluate regional wall motion. Left ventricular diastolic parameters are indeterminate.  2. Right ventricular systolic function is normal. The right ventricular size is normal. Tricuspid regurgitation signal is inadequate for assessing PA pressure.  3. The mitral valve is normal in structure. No evidence of mitral valve regurgitation. No evidence of mitral stenosis.  4. The aortic valve is normal in structure. Aortic valve regurgitation is not visualized. Aortic valve sclerosis/calcification is present, without any evidence of aortic stenosis.  5. The inferior vena cava is dilated in size with <50% respiratory variability, suggesting right atrial pressure of 15 mmHg.  6. Very suboptimal images. FINDINGS  Left Ventricle: Left ventricular ejection fraction, by estimation, is 55 to 60%. The left ventricle has normal function. Left ventricular endocardial border not optimally defined to evaluate regional wall motion. The left ventricular internal cavity size was normal in size. There is no left ventricular hypertrophy. Left ventricular diastolic  parameters are indeterminate. Right Ventricle: The right ventricular size is normal. No increase in right ventricular wall thickness. Right ventricular systolic function is normal. Tricuspid regurgitation signal is inadequate for assessing PA pressure. Left Atrium: Left atrial size was normal in size. Right Atrium: Right atrial size was normal in size. Pericardium: There is no evidence of pericardial effusion. Mitral Valve: The mitral valve is normal in structure. No evidence of mitral valve  regurgitation. No evidence of mitral valve stenosis. MV peak gradient, 7.3 mmHg. The mean mitral valve gradient is 3.0 mmHg. Tricuspid Valve: The tricuspid valve is normal in structure. Tricuspid valve regurgitation is not demonstrated. No evidence of tricuspid stenosis. Aortic Valve: The aortic valve is normal in structure. Aortic valve regurgitation is not visualized. Aortic valve sclerosis/calcification is present, without any evidence of aortic stenosis. Aortic valve mean gradient measures 4.0 mmHg. Aortic valve peak  gradient measures 7.8 mmHg. Aortic valve area, by VTI measures 1.39 cm. Pulmonic Valve: The pulmonic valve was normal in structure. Pulmonic valve regurgitation is not visualized. No evidence of pulmonic stenosis. Aorta: The aortic root is normal in size and structure. Venous: The inferior vena cava is dilated in size with less than 50% respiratory variability, suggesting right atrial pressure of 15 mmHg. IAS/Shunts: No atrial level shunt detected by color flow Doppler.  LEFT VENTRICLE PLAX 2D LVIDd:         4.46 cm   Diastology LVIDs:         3.09 cm   LV e' medial:    13.60 cm/s LV PW:         0.98 cm   LV E/e' medial:  8.8 LV IVS:        0.80 cm   LV e' lateral:   11.30 cm/s LVOT diam:     1.80 cm   LV E/e' lateral: 10.6 LV SV:         29 LV SV Index:   14 LVOT Area:     2.54 cm  LEFT ATRIUM           Index LA diam:      3.70 cm 1.74 cm/m LA Vol (A4C): 46.1 ml 21.72 ml/m  AORTIC VALVE                     PULMONIC VALVE AV Area (Vmax):    1.21 cm     PV Vmax:       0.99 m/s AV Area (Vmean):   1.16 cm     PV Vmean:      71.700 cm/s AV Area (VTI):     1.39 cm     PV VTI:        0.184 m AV Vmax:           140.00 cm/s  PV Peak grad:  3.9 mmHg AV Vmean:          93.700 cm/s  PV Mean grad:  2.0 mmHg AV VTI:            0.207 m AV Peak Grad:      7.8 mmHg AV Mean Grad:      4.0 mmHg LVOT Vmax:         66.60 cm/s LVOT Vmean:        42.700 cm/s LVOT VTI:          0.113 m LVOT/AV VTI ratio: 0.55  AORTA Ao Root diam: 3.10 cm MITRAL VALVE MV Area (PHT): 6.71 cm     SHUNTS MV Area VTI:   1.53 cm     Systemic VTI:  0.11 m MV Peak grad:  7.3 mmHg     Systemic Diam: 1.80 cm MV Mean grad:  3.0 mmHg MV Vmax:       1.35 m/s MV Vmean:      72.2 cm/s MV Decel Time: 113 msec MV E velocity: 119.23 cm/s Kathlyn Sacramento MD Electronically signed by Kathlyn Sacramento  MD Signature Date/Time: 03/16/2021/9:24:34 AM    Final    ECHO TEE  Result Date: 03/18/2021    TRANSESOPHOGEAL ECHO REPORT   Patient Name:   Rebecca Park Date of Exam: 03/18/2021 Medical Rec #:  [id number]      Height:       66.0 in Accession #:    [id number]     Weight:       230.0 lb Date of Birth:  Aug 30, 1960       BSA:          2.122 m Patient Age:    32 years       BP:           113/62 mmHg Patient Gender: F              HR:           93 bpm. Exam Location:  [facility] Procedure: Transesophageal Echo, Color Doppler and Saline Contrast Bubble Study Indications:     Bacteremia  History:         Patient has prior history of Echocardiogram examinations, most                  recent 03/16/2021. Risk Factors:Hypertension and Diabetes.  Sonographer:     Charmayne Sheer Referring Phys:  [id number] Clance Boll BERGE Diagnosing Phys: Ida Rogue MD PROCEDURE: After discussion of the risks and benefits of a TEE, an informed consent was obtained from the patient. TEE procedure time was 30 minutes. The transesophogeal probe was passed without difficulty through the esophogus of the patient.  Imaged were obtained with the patient in a left lateral decubitus position. Local oropharyngeal anesthetic was provided with Benzocaine spray and Cetacaine. Sedation performed by performing physician. Patients was under conscious sedation during this procedure.  Anesthetic administered: 43mg of Fentanyl, 2.066mof Versed. Image quality was excellent. The patient's vital signs; including heart rate, blood pressure, and oxygen saturation; remained stable throughout the procedure. The patient developed no complications during the procedure. IMPRESSIONS  1. No valve endocarditis noted (see aortic valve details.  2. Left ventricular ejection fraction, by estimation, is 45 to 50%. The left ventricle has mildly decreased function. The left ventricle demonstrates global hypokinesis.  3. Right ventricular systolic function is normal. The right ventricular size is normal.  4. No left atrial/left atrial appendage thrombus was detected.  5. The mitral valve is normal in structure. Mild mitral valve regurgitation. No evidence of mitral stenosis.  6. The aortic valve is normal in structure. Aortic valve regurgitation is not visualized. Aortic valve sclerosis is present, with no evidence of aortic valve stenosis. Samll lambl excrescence noted (unable to definatively exclude very small vegetation)  7. The inferior vena cava is normal in size with greater than 50% respiratory variability, suggesting right atrial pressure of 3 mmHg.  8. Agitated saline contrast bubble study was negative, with no evidence of any interatrial shunt.  9. There is mild (Grade II) atheroma plaque involving the aortic arch and descending aorta. 10. Tricuspid valve regurgitation is mild to moderate. Conclusion(s)/Recommendation(s): Normal biventricular function without evidence of hemodynamically significant valvular heart disease. FINDINGS  Left Ventricle: Left ventricular ejection fraction, by estimation, is 45 to 50%. The left ventricle has mildly  decreased function. The left ventricle demonstrates global hypokinesis. The left ventricular internal cavity size was normal in size. There is  no left ventricular hypertrophy. Right Ventricle: The right ventricular size is normal. No increase in right ventricular wall thickness. Right ventricular systolic  function is normal. Left Atrium: Left atrial size was normal in size. No left atrial/left atrial appendage thrombus was detected. Right Atrium: Right atrial size was normal in size. Pericardium: There is no evidence of pericardial effusion. Mitral Valve: The mitral valve is normal in structure. There is mild calcification of the mitral valve leaflet(s). Mild mitral valve regurgitation. No evidence of mitral valve stenosis. Tricuspid Valve: The tricuspid valve is normal in structure. Tricuspid valve regurgitation is mild to moderate. No evidence of tricuspid stenosis. Aortic Valve: The aortic valve is normal in structure. Aortic valve regurgitation is not visualized. Aortic valve sclerosis is present, with no evidence of aortic valve stenosis. Pulmonic Valve: The pulmonic valve was normal in structure. Pulmonic valve regurgitation is not visualized. No evidence of pulmonic stenosis. Aorta: The aortic root is normal in size and structure. There is mild (Grade II) atheroma plaque involving the aortic arch and descending aorta. Venous: The inferior vena cava is normal in size with greater than 50% respiratory variability, suggesting right atrial pressure of 3 mmHg. IAS/Shunts: No atrial level shunt detected by color flow Doppler. Agitated saline contrast was given intravenously to evaluate for intracardiac shunting. Agitated saline contrast bubble study was negative, with no evidence of any interatrial shunt. Ida Rogue MD Electronically signed by Ida Rogue MD Signature Date/Time: 03/18/2021/7:29:38 PM    Final    [place] EKG SITE RITE  Result Date: 03/18/2021 If Site Rite image not attached, placement could  not be confirmed due to current cardiac rhythm.    Subjective: No acute issues or events overnight denies nausea vomiting diarrhea constipation headache fevers chills or chest pain   Discharge Exam: Vitals:   03/27/21 0808 03/27/21 1128  BP: 132/72 111/62  Pulse: 80 84  Resp: 20 17  Temp: 98.2 F (36.8 C) 98.3 F (36.8 C)  SpO2: 98% 100%   Vitals:   03/26/21 2339 03/27/21 0216 03/27/21 0808 03/27/21 1128  BP: (!) 109/58 (!) 102/56 132/72 111/62  Pulse: 80 80 80 84  Resp: '16 15 20 17  ' Temp: 98.2 F (36.8 C) 98.4 F (36.9 C) 98.2 F (36.8 C) 98.3 F (36.8 C)  TempSrc:      SpO2: 95% 96% 98% 100%  Weight:      Height:        General: Pt is alert, awake, not in acute distress Cardiovascular: RRR, S1/S2 +, no rubs, no gallops Respiratory: CTA bilaterally, no wheezing, no rhonchi Abdominal: Soft, NT, ND, bowel sounds + Extremities: no edema, no cyanosis; left BKA bandage clean dry intact    The results of significant diagnostics from this hospitalization (including imaging, microbiology, ancillary and laboratory) are listed below for reference.     Microbiology: No results found for this or any previous visit (from the past 240 hour(s)).   Labs: BNP (last 3 results) No results for input(s): BNP in the last 8760 hours. Basic Metabolic Panel: Recent Labs  Lab 03/22/21 0508 03/27/21 0605  NA 135 137  K 3.6 3.5  CL 100 97*  CO2 30 32  GLUCOSE 200* 122*  BUN 7 10  CREATININE 0.70 0.74  CALCIUM 7.6* 8.0*   Liver Function Tests: No results for input(s): AST, ALT, ALKPHOS, BILITOT, PROT, ALBUMIN in the last 168 hours. No results for input(s): LIPASE, AMYLASE in the last 168 hours. No results for input(s): AMMONIA in the last 168 hours. CBC: Recent Labs  Lab 03/21/21 0535 03/22/21 0508  WBC 11.9* 13.1*  NEUTROABS 9.3*  --   HGB  8.8* 9.1*  HCT 27.7* 29.1*  MCV 86.3 85.1  PLT 177 201   Cardiac Enzymes: No results for input(s): CKTOTAL, CKMB, CKMBINDEX,  TROPONINI in the last 168 hours. BNP: Invalid input(s): POCBNP CBG: Recent Labs  Lab 03/26/21 1134 03/26/21 1645 03/26/21 2051 03/27/21 0807 03/27/21 1126  GLUCAP 151* 161* 146* 117* 153*   D-Dimer No results for input(s): DDIMER in the last 72 hours. Hgb A1c No results for input(s): HGBA1C in the last 72 hours. Lipid Profile No results for input(s): CHOL, HDL, LDLCALC, TRIG, CHOLHDL, LDLDIRECT in the last 72 hours. Thyroid function studies No results for input(s): TSH, T4TOTAL, T3FREE, THYROIDAB in the last 72 hours.  Invalid input(s): FREET3 Anemia work up No results for input(s): VITAMINB12, FOLATE, FERRITIN, TIBC, IRON, RETICCTPCT in the last 72 hours. Urinalysis    Component Value Date/Time   COLORURINE YELLOW (A) 03/13/2021 1027   APPEARANCEUR CLOUDY (A) 03/13/2021 1027   LABSPEC 1.022 03/13/2021 1027   PHURINE 5.0 03/13/2021 1027   GLUCOSEU >=500 (A) 03/13/2021 1027   HGBUR SMALL (A) 03/13/2021 1027   BILIRUBINUR NEGATIVE 03/13/2021 1027   KETONESUR 5 (A) 03/13/2021 1027   PROTEINUR 30 (A) 03/13/2021 1027   NITRITE NEGATIVE 03/13/2021 1027   LEUKOCYTESUR SMALL (A) 03/13/2021 1027   Sepsis Labs Invalid input(s): PROCALCITONIN,  WBC,  LACTICIDVEN Microbiology No results found for this or any previous visit (from the past 240 hour(s)).   Time coordinating discharge: Over 30 minutes  SIGNED:   Little Ishikawa, DO [facility] 03/27/2021, 11:49 AM Pager   If 7PM-7AM, please contact night-coverage [email or web address]

## 2021-03-27 NOTE — TOC Transition Note (Addendum)
Transition of Care [facility] - CM/SW Discharge Note   Patient Details  Name: Rebecca Park MRN: [id number] Date of Birth: [birth year]  Transition of Care [facility]) CM/SW Contact:  Magnus Ivan, LCSW Phone Number: 03/27/2021, 3:10 PM   Clinical Narrative:   Discharge to [facility] today. Room 518-1. Confirmed with Admissions Worker [employer].  Updated MD, RN, and patient. Asked RN to call report. EMS paperwork completed. [facility] arranged, patient is 5th on the list as of 3:10. Notified bedside RN and [name].  4:30- [facility] called the floor and said they cannot transport patient until 9am tomorrow. CSW called [facility] who stated they are not available today. Called [facility] and spoke to [employer] who stated they cannot take patient today due to truck availability. Gerald Stabs stated they cannot schedule EMS for tomorrow either due to no convalescent trucks. Updated TOC Supervisor and RN.  5:20- TOC Supervisor Manuela Schwartz has spoken to [facility] who confirms they cannot take patient today or over the weekend at all. Called [facility] who said they can pick patient up by 6:30 pm today. Updated RN and [name] at [facility]. Marden Noble is aware [facility] could not take patient after 9 pm today.      Final next level of care: Skilled Nursing Facility Barriers to Discharge: Barriers Resolved   Patient Goals and CMS Choice Patient states their goals for this hospitalization and ongoing recovery are:: SNF CMS Medicare.gov Compare Post Acute Care list provided to:: Patient Choice offered to / list presented to : Patient  Discharge Placement              Patient chooses bed at: [facility] Patient to be transferred to facility by: [facility] Name of family member notified: Patient has been notified. Patient and family notified of of transfer: 03/27/21  Discharge Plan and Services   Discharge Planning Services: CM Consult            DME Arranged: Lightweight manual wheelchair with  seat cushion, 3-N-1 DME Agency: [employer] Date DME Agency Contacted: 03/17/21 Time DME Agency Contacted: [id number] Representative spoke with at DME Agency: [facility] HH Arranged: PT, OT HH Agency: [facility] Date [place]: 03/17/21 Time [place]: [phone] Representative spoke with at [place]: [place] Interventions     Readmission Risk Interventions No flowsheet data found.

## 2021-04-02 ENCOUNTER — Inpatient Hospital Stay: Payer: BC Managed Care – PPO | Admitting: Infectious Diseases

## 2021-04-09 ENCOUNTER — Other Ambulatory Visit: Payer: Self-pay

## 2021-04-09 ENCOUNTER — Ambulatory Visit: Payer: BC Managed Care – PPO | Attending: Infectious Diseases | Admitting: Infectious Diseases

## 2021-04-09 DIAGNOSIS — M009 Pyogenic arthritis, unspecified: Secondary | ICD-10-CM | POA: Diagnosis not present

## 2021-04-09 DIAGNOSIS — E11610 Type 2 diabetes mellitus with diabetic neuropathic arthropathy: Secondary | ICD-10-CM | POA: Insufficient documentation

## 2021-04-09 DIAGNOSIS — B9561 Methicillin susceptible Staphylococcus aureus infection as the cause of diseases classified elsewhere: Secondary | ICD-10-CM | POA: Insufficient documentation

## 2021-04-09 DIAGNOSIS — Z89512 Acquired absence of left leg below knee: Secondary | ICD-10-CM | POA: Diagnosis not present

## 2021-04-09 DIAGNOSIS — D649 Anemia, unspecified: Secondary | ICD-10-CM | POA: Insufficient documentation

## 2021-04-09 DIAGNOSIS — E11621 Type 2 diabetes mellitus with foot ulcer: Secondary | ICD-10-CM | POA: Diagnosis not present

## 2021-04-09 DIAGNOSIS — M8610 Other acute osteomyelitis, unspecified site: Secondary | ICD-10-CM

## 2021-04-09 DIAGNOSIS — E11628 Type 2 diabetes mellitus with other skin complications: Secondary | ICD-10-CM

## 2021-04-09 DIAGNOSIS — E1169 Type 2 diabetes mellitus with other specified complication: Secondary | ICD-10-CM

## 2021-04-09 DIAGNOSIS — L089 Local infection of the skin and subcutaneous tissue, unspecified: Secondary | ICD-10-CM | POA: Diagnosis not present

## 2021-04-09 DIAGNOSIS — E119 Type 2 diabetes mellitus without complications: Secondary | ICD-10-CM | POA: Diagnosis not present

## 2021-04-09 DIAGNOSIS — R7881 Bacteremia: Secondary | ICD-10-CM

## 2021-04-09 NOTE — Progress Notes (Signed)
The purpose of this virtual visit is to provide medical care while limiting exposure to the novel coronavirus (COVID19) for both patient and office staff.   Consent was obtained for phone visit:  Yes.   Answered questions that patient had about telehealth interaction:  Yes.   I discussed the limitations, risks, security and privacy concerns of performing an evaluation and management service by telephone. I also discussed with the patient that there may be a patient responsible charge related to this service. The patient expressed understanding and agreed to proceed.   Patient Location: LTAC in [place] Provider Location: office People on the call- patient, provider, heather LPN [place] and Roselyn Reef CMA -office This is a follow up visit after recent hospitalization 03/13/21-03/27/21  for Left diabetic foot infection causing staph aureus bacteremia- She underwent BKA. TEE questioned vegetation VS lambls excrescence. Repeat blood culture was neg- It was decided to treat her with 4 weeks of Iv antibiotic. She was discharged to [facility] on cefazolin to complete on 04/15/21 She is doing very well 100% adherent to Iv cefazolin No nausea, vomiting Some loose stools No rash No fever No pain abdomen No cough or SOB Participating in PT  Meds reviewed  O/e pt looks well No distress Left BKA site Healed well- scab in one area- staples still present      Labs- Cr, WBC, N Hb 9.4  Impression/recommendation Diabetic left foot  infection   Charcot arthropathy. Medial malleolus ulcer with underlying osteomyelitis and septic arthritis. Status post BKA Therapeutic amputation  MSSA bacteremia secondary to the above. On cefazolin until 04/15/21- receiving 4 weeks.  TEE questioned Lambl excrescence VS small vegetation on the aortic valve - hence erring on the side of caution and given 4 weeks antibiotic  Diabetes mellitus better controlled in LTAC  AKI.  Resolved   Anemia stable Discussed with patient and  her nurse She needs follow up with vascular for removal of staples Communicated with vascular   Spent 16 min in the video call. Follow prn

## 2021-04-13 DIAGNOSIS — E1165 Type 2 diabetes mellitus with hyperglycemia: Secondary | ICD-10-CM | POA: Diagnosis not present

## 2021-04-13 DIAGNOSIS — Z792 Long term (current) use of antibiotics: Secondary | ICD-10-CM | POA: Diagnosis not present

## 2021-04-13 DIAGNOSIS — B3731 Acute candidiasis of vulva and vagina: Secondary | ICD-10-CM | POA: Diagnosis not present

## 2021-04-13 DIAGNOSIS — M869 Osteomyelitis, unspecified: Secondary | ICD-10-CM | POA: Diagnosis not present

## 2021-04-16 ENCOUNTER — Encounter: Payer: Self-pay | Admitting: Internal Medicine

## 2021-04-16 ENCOUNTER — Telehealth: Payer: Self-pay

## 2021-04-16 ENCOUNTER — Ambulatory Visit (INDEPENDENT_AMBULATORY_CARE_PROVIDER_SITE_OTHER): Payer: Self-pay | Admitting: Nurse Practitioner

## 2021-04-16 NOTE — Progress Notes (Signed)
Called by [employer]. Patient came to them on 12/30  Has been on only 1 gram cefazolin q8hrs for mssa bacteremia and wound infection. To finish today 1/19. Started 12/16  Appear to have lower dose since 12/30  Advised [facility] to give 2 more weeks of proper dosing 2 g q8hrs until 04/30/2021   Sent message to dr Delaine Lame as she previously follow patient

## 2021-04-16 NOTE — Telephone Encounter (Signed)
Patient now extended IV abx for 2 more weeks per SNF.

## 2021-04-24 ENCOUNTER — Other Ambulatory Visit: Payer: Self-pay

## 2021-04-24 ENCOUNTER — Ambulatory Visit (INDEPENDENT_AMBULATORY_CARE_PROVIDER_SITE_OTHER): Payer: BC Managed Care – PPO | Admitting: Nurse Practitioner

## 2021-04-24 ENCOUNTER — Encounter (INDEPENDENT_AMBULATORY_CARE_PROVIDER_SITE_OTHER): Payer: Self-pay | Admitting: Nurse Practitioner

## 2021-04-24 VITALS — BP 146/81 | HR 94 | Resp 16

## 2021-04-24 DIAGNOSIS — Z89512 Acquired absence of left leg below knee: Secondary | ICD-10-CM

## 2021-04-24 DIAGNOSIS — E0821 Diabetes mellitus due to underlying condition with diabetic nephropathy: Secondary | ICD-10-CM

## 2021-04-24 DIAGNOSIS — E782 Mixed hyperlipidemia: Secondary | ICD-10-CM

## 2021-04-25 ENCOUNTER — Encounter (INDEPENDENT_AMBULATORY_CARE_PROVIDER_SITE_OTHER): Payer: Self-pay | Admitting: Nurse Practitioner

## 2021-04-25 NOTE — Progress Notes (Signed)
Subjective:    Patient ID: Rebecca Park, female    DOB: 02-May-1960, 61 y.o.   MRN: [id number] Chief Complaint  Patient presents with   Follow-up    3 week follow up    Rebecca Park is a 61 year old female that presents today as a follow-up after right below-knee amputation on 03/25/2021.  She ultimately developed an ulceration on her right lower extremity and this became septic leading to her amputation.  The patient has been in a rehab facility and all staples were removed.  At this time the incision is well-healed with no evidence of dehiscence or drainage.  The patient notes that she has been working diligently with physical therapy and is very ready to proceed with a prosthesis.   Review of Systems  Musculoskeletal:  Positive for gait problem.  All other systems reviewed and are negative.     Objective:   Physical Exam Vitals reviewed.  HENT:     Head: Normocephalic.  Cardiovascular:     Rate and Rhythm: Normal rate.  Pulmonary:     Effort: Pulmonary effort is normal.  Musculoskeletal:     Left Lower Extremity: Left leg is amputated below knee.  Skin:    General: Skin is warm and dry.  Neurological:     Mental Status: She is alert and oriented to person, place, and time.  Psychiatric:        Mood and Affect: Mood normal.        Behavior: Behavior normal.        Thought Content: Thought content normal.        Judgment: Judgment normal.    BP (!) 146/81 (BP Location: Left Arm)    Pulse 94    Resp 16   Past Medical History:  Diagnosis Date   Diabetes mellitus without complication (HCC)    Hypertension    PONV (postoperative nausea and vomiting)    the day after rt 5th toe amputation surgery    Social History   Socioeconomic History   Marital status: Married    Spouse name: Not on file   Number of children: Not on file   Years of education: Not on file   Highest education level: Not on file  Occupational History   Not on file  Tobacco Use   Smoking  status: Never   Smokeless tobacco: Never  Vaping Use   Vaping Use: Never used  Substance and Sexual Activity   Alcohol use: Not Currently    Comment: recently quit 01/2018, 3-4 beers QD   Drug use: Never   Sexual activity: Yes  Other Topics Concern   Not on file  Social History Narrative   Not on file   Social Determinants of Health   Financial Resource Strain: Not on file  Food Insecurity: Not on file  Transportation Needs: Not on file  Physical Activity: Not on file  Stress: Not on file  Social Connections: Not on file  Intimate Partner Violence: Not on file    Past Surgical History:  Procedure Laterality Date   AMPUTATION Right 11/10/2018   Procedure: AMPUTATION RAY (amputation 5th metatarsal Right foot;  Surgeon: Caroline More, DPM;  Location: [facility];  Service: Podiatry;  Laterality: Right;   AMPUTATION Left 03/15/2021   Procedure: AMPUTATION BELOW KNEE;  Surgeon: Zara Chess, MD;  Location: [facility];  Service: Vascular;  Laterality: Left;   AMPUTATION TOE Right 12/05/2018   Procedure: RAY RIGHT 4TH AMPUTATION;  Surgeon: Caroline More, DPM;  Location:  [place];  Service: Podiatry;  Laterality: Right;  mac with local DIABETIC-oral meds   BONE BIOPSY Right 11/10/2018   Procedure: BONE BIOPSY- Right 4th toe;  Surgeon: Caroline More, DPM;  Location: [facility];  Service: Podiatry;  Laterality: Right;   BUNIONECTOMY Bilateral    COLONOSCOPY WITH PROPOFOL N/A 09/07/2018   Procedure: COLONOSCOPY WITH PROPOFOL;  Surgeon: Lin Landsman, MD;  Location: [facility] ENDOSCOPY;  Service: Gastroenterology;  Laterality: N/A;   COLONOSCOPY WITH PROPOFOL N/A 09/17/2019   Procedure: COLONOSCOPY WITH PROPOFOL;  Surgeon: Lin Landsman, MD;  [facility] END[facility]COPY;  Service: Gastroenterology;  Laterality: N/A;   TEE WITHOUT CARDIOVERSION N/A 03/18/2021   Procedure: TRANSESOPHAGEAL ECHOCARDIOGRAM (TEE);  Surgeon: Minna Merritts, MD;  Location: [facility];  Service:  Cardiovascular;  Laterality: N/A;    Family History  Problem Relation Age of Onset   Heart disease Father    Heart attack Father    Heart disease Maternal Grandfather    Breast cancer Neg Hx     No Known Allergies  CBC Latest Ref Rng & Units 03/22/2021 03/21/2021 03/17/2021  WBC 4.0 - 10.5 K/uL 13.1(H) 11.9(H) 16.2(H)  Hemoglobin 12.0 - 15.0 g/dL 9.1(L) 8.8(L) 9.5(L)  Hematocrit 36.0 - 46.0 % 29.1(L) 27.7(L) 29.8(L)  Platelets 150 - 400 K/uL 201 177 179      CMP     Component Value Date/Time   NA 137 03/27/2021 0605   K 3.5 03/27/2021 0605   CL 97 (L) 03/27/2021 0605   CO2 32 03/27/2021 0605   GLUCOSE 122 (H) 03/27/2021 0605   BUN 10 03/27/2021 0605   CREATININE 0.74 03/27/2021 0605   CREATININE 0.93 09/15/2018 1545   CALCIUM 8.0 (L) 03/27/2021 0605   PROT 5.9 (L) 03/15/2021 0248   ALBUMIN 2.2 (L) 03/15/2021 0248   AST 21 03/15/2021 0248   ALT 18 03/15/2021 0248   ALKPHOS 148 (H) 03/15/2021 0248   BILITOT 1.6 (H) 03/15/2021 0248   GFRNONAA >60 03/27/2021 0605   GFRAA >60 11/11/2018 0356     No results found.     Assessment & Plan:   1. Left below-knee amputee [facility]) Was seen for further evaluation for left below knee prosthesis.She Is a 61 year old female who had amputation on 03/15/2021.  At the time he is well healed and ready for fitting of new left below knee prosthesis.  He is a highly motivated individual and should do well once fitted with prosthesis.  She has no problem returning to a K2 ambulator, which will allow her to walk inside and outside of his home and overload level barriers.   The patient follow-up in approximately a month for ABIs to ensure there is no other perfusion issues on her left lower extremity.  2. Diabetes mellitus due to underlying condition with diabetic nephropathy, unspecified whether long term insulin use ([place]) Continue hypoglycemic medications as already ordered, these medications have been reviewed and there are no changes at  this time.  Hgb A1C to be monitored as already arranged by primary service   3. Mixed hyperlipidemia Continue statin as ordered and reviewed, no changes at this time    Current Outpatient Medications on File Prior to Visit  Medication Sig Dispense Refill   atorvastatin (LIPITOR) 40 MG tablet TAKE 1 TABLET BY MOUTH EVERY DAY 90 tablet 0   glipiZIDE (GLUCOTROL) 10 MG tablet Take 10 mg by mouth daily before breakfast.     Multiple Vitamins-Minerals (WOMENS MULTIVITAMIN PO) Take 1 tablet by  mouth daily.      pantoprazole (PROTONIX) 40 MG tablet Take 1 tablet (40 mg total) by mouth 2 (two) times daily. 30 tablet 0   torsemide (DEMADEX) 10 MG tablet Take 1 tablet (10 mg total) by mouth daily. (Patient taking differently: Take 20 mg by mouth daily.) 30 tablet 0   TRULICITY 1.5 DD/2.2GU SOPN Inject 0.5 mLs into the skin once a week.     Lancets 30G MISC 1 each by Does not apply route 2 (two) times daily. 200 each 1   ONETOUCH ULTRA test strip 1 EACH BY OTHER ROUTE 2 (TWO) TIMES DAILY. 200 strip 1   SANTYL ointment Apply 1 application topically daily as needed.     No current facility-administered medications on file prior to visit.    There are no Patient Instructions on file for this visit. No follow-ups on file.   Kris Hartmann, NP

## 2021-04-30 DIAGNOSIS — I10 Essential (primary) hypertension: Secondary | ICD-10-CM | POA: Diagnosis not present

## 2021-04-30 DIAGNOSIS — M869 Osteomyelitis, unspecified: Secondary | ICD-10-CM | POA: Diagnosis not present

## 2021-04-30 DIAGNOSIS — R5381 Other malaise: Secondary | ICD-10-CM | POA: Diagnosis not present

## 2021-04-30 DIAGNOSIS — M14679 Charcot's joint, unspecified ankle and foot: Secondary | ICD-10-CM | POA: Diagnosis not present

## 2021-05-04 DIAGNOSIS — A419 Sepsis, unspecified organism: Secondary | ICD-10-CM | POA: Diagnosis not present

## 2021-05-04 DIAGNOSIS — M869 Osteomyelitis, unspecified: Secondary | ICD-10-CM | POA: Diagnosis not present

## 2021-05-04 DIAGNOSIS — M14679 Charcot's joint, unspecified ankle and foot: Secondary | ICD-10-CM | POA: Diagnosis not present

## 2021-05-04 DIAGNOSIS — R5381 Other malaise: Secondary | ICD-10-CM | POA: Diagnosis not present

## 2021-05-11 DIAGNOSIS — I10 Essential (primary) hypertension: Secondary | ICD-10-CM | POA: Diagnosis not present

## 2021-05-11 DIAGNOSIS — D649 Anemia, unspecified: Secondary | ICD-10-CM | POA: Diagnosis not present

## 2021-05-11 DIAGNOSIS — E119 Type 2 diabetes mellitus without complications: Secondary | ICD-10-CM | POA: Diagnosis not present

## 2021-05-11 DIAGNOSIS — Z89512 Acquired absence of left leg below knee: Secondary | ICD-10-CM | POA: Diagnosis not present

## 2021-05-11 DIAGNOSIS — Z8619 Personal history of other infectious and parasitic diseases: Secondary | ICD-10-CM | POA: Diagnosis not present

## 2021-05-11 DIAGNOSIS — E782 Mixed hyperlipidemia: Secondary | ICD-10-CM | POA: Diagnosis not present

## 2021-05-11 DIAGNOSIS — E538 Deficiency of other specified B group vitamins: Secondary | ICD-10-CM | POA: Diagnosis not present

## 2021-05-22 DIAGNOSIS — E876 Hypokalemia: Secondary | ICD-10-CM | POA: Diagnosis not present

## 2021-05-27 DIAGNOSIS — M869 Osteomyelitis, unspecified: Secondary | ICD-10-CM | POA: Diagnosis not present

## 2021-05-27 DIAGNOSIS — R531 Weakness: Secondary | ICD-10-CM | POA: Diagnosis not present

## 2021-06-27 DIAGNOSIS — M869 Osteomyelitis, unspecified: Secondary | ICD-10-CM | POA: Diagnosis not present

## 2021-06-27 DIAGNOSIS — R531 Weakness: Secondary | ICD-10-CM | POA: Diagnosis not present

## 2021-07-03 DIAGNOSIS — Z89512 Acquired absence of left leg below knee: Secondary | ICD-10-CM | POA: Diagnosis not present

## 2021-07-14 ENCOUNTER — Ambulatory Visit: Payer: BC Managed Care – PPO | Attending: Nurse Practitioner

## 2021-07-14 DIAGNOSIS — M6281 Muscle weakness (generalized): Secondary | ICD-10-CM | POA: Insufficient documentation

## 2021-07-14 DIAGNOSIS — R262 Difficulty in walking, not elsewhere classified: Secondary | ICD-10-CM | POA: Diagnosis not present

## 2021-07-14 NOTE — Therapy (Signed)
?OUTPATIENT PHYSICAL THERAPY PROSTHETIC EVALUATION ? ? ?Patient Name: Rebecca Park ?MRN: [id number] ?DOB:April 23, 1960, 61 y.o., female ?Today's Date: 07/14/2021 ? ? PT End of Session - 07/14/21 1048   ? ? Visit Number 1   ? Number of Visits 17   ? Date for PT Re-Evaluation 09/08/21   ? Authorization Type eval: 07/14/21   ? PT Start Time 0845   ? PT Stop Time 0930   ? PT Time Calculation (min) 45 min   ? Equipment Utilized During Treatment Gait belt   ? Activity Tolerance Patient tolerated treatment well   ? Behavior During Therapy [facility] for tasks assessed/performed   ? ?  ?  ? ?  ? ? ?Past Medical History:  ?Diagnosis Date  ? Diabetes mellitus without complication ([place])   ? Hypertension   ? PONV (postoperative nausea and vomiting)   ? the day after rt 5th toe amputation surgery  ? ?Past Surgical History:  ?Procedure Laterality Date  ? AMPUTATION Right 11/10/2018  ? Procedure: AMPUTATION RAY (amputation 5th metatarsal Right foot;  Surgeon: Caroline More, DPM;  Location: [facility];  Service: Podiatry;  Laterality: Right;  ? AMPUTATION Left 03/15/2021  ? Procedure: AMPUTATION BELOW KNEE;  Surgeon: Zara Chess, MD;  Location: [facility];  Service: Vascular;  Laterality: Left;  ? AMPUTATION TOE Right 12/05/2018  ? Procedure: RAY RIGHT 4TH AMPUTATION;  Surgeon: Caroline More, DPM;  Location: [place];  Service: Podiatry;  Laterality: Right;  mac with local ?DIABETIC-oral meds  ? BONE BIOPSY Right 11/10/2018  ? Procedure: BONE BIOPSY- Right 4th toe;  Surgeon: Caroline More, DPM;  Location: [facility];  Service: Podiatry;  Laterality: Right;  ? BUNIONECTOMY Bilateral   ? COLONOSCOPY WITH PROPOFOL N/A 09/07/2018  ? Procedure: COLONOSCOPY WITH PROPOFOL;  Surgeon: Lin Landsman, MD;  Location: [facility] END[facility]COPY;  Service: Gastroenterology;  Laterality: N/A;  ? COLONOSCOPY WITH PROPOFOL N/A 09/17/2019  ? Procedure: COLONOSCOPY WITH PROPOFOL;  Surgeon: Lin Landsman, MD;  Location: [facility] ENDOSCOPY;  Service:  Gastroenterology;  Laterality: N/A;  ? TEE WITHOUT CARDIOVERSION N/A 03/18/2021  ? Procedure: TRANSESOPHAGEAL ECHOCARDIOGRAM (TEE);  Surgeon: Minna Merritts, MD;  Location: [facility];  Service: Cardiovascular;  Laterality: N/A;  ? ?Patient Active Problem List  ? Diagnosis Date Noted  ? Severe sepsis with acute organ dysfunction due to methicillin susceptible Staphylococcus aureus (MSSA) ([place]) 03/21/2021  ? Status post amputation of toe of right foot ([place]) 04/08/2020  ? Osteomyelitis ([place]) 11/08/2018  ? HLD (hyperlipidemia) 09/17/2018  ? Diabetes mellitus ([place]) 03/27/2018  ? White coat syndrome with diagnosis of hypertension 03/27/2018  ? ? ?PCP: Marinda Elk, MD ? ?REFERRING PROVIDER: Kris Hartmann, NP ? ?REFERRING DIAGNOSIS: Z47.89 (ICD-10-CM) - Encounter for prosthetic gait training ? ?THERAPY DIAG: Difficulty in walking, not elsewhere classified - Plan: PT plan of care cert/re-cert ? ?Muscle weakness (generalized) - Plan: PT plan of care cert/re-cert ? ?ONSET DATE: 03/15/21 ? ?FOLLOW UP APPT WITH PROVIDER: Yes  ? ? ?SUBJECTIVE:                                                                                                                                                                                        ? ?  Chief Complaint: L BKA ? ?Pertinent History ?Rebecca Park is a 61 year old female underwent a left below-knee amputation on 03/15/2021 secondary to a LLE ulceration and sepsis. The patient discharged to a rehab facility where she stayed for about 37 days per her report. She worked with therapy at the facility and received her LLE prosthesis about 1.5 weeks ago. She states that she has done some standing and weight shifting in the prosthesis but no true ambulation. She states good healing in the distal LLE and has had minimal pain including very infrequent phantom LLE pain/itching.   ?Pain: No, no pain currently. She has infrequent LLE phantom pain/itching.  ?Numbness/Tingling: No ?Focal Weakness:  No, generalized weakness secondary  ?Recent changes in overall health/medication: No ?Prior history of physical therapy for balance:  No ?Falls: Has patient fallen in last 6 months? 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 ?Precautions: None ? ?Weight Bearing Restrictions: No ? ?Living Environment ?Lives with: lives with their spouse, mom lives next door. Spouse works during the day ?Lives in: House/apartment, one level. Has a ramp (a little steep but she could navigate with a wheelchair if necessary); ?Has following equipment at home: Single point cane, Walker - 2 wheeled, Wheelchair (manual), shower chair, bed side commode, Grab bars, and Ramped entry ? ? ?Patient Goals: August 27, 2021 her company has an anniversary celebration and pt would like to be able to be ambulating. Pt would like to be able to return to work.  ? ? ? ?OBJECTIVE:  ? ?Patient Surveys  ?FOTO: 34 ,predicted improvement to 53 ? ? ?Cognition ?Patient is oriented to person, place, and time.  ?Recent memory is intact.  ?Remote memory is intact.  ?Attention span and concentration are intact.  ?Expressive speech is intact.  ?Patient's fund of knowledge is within normal limits for educational level. ?   ? ?Gross Musculoskeletal Assessment ?Tremor: None ?Bulk: Normal ?Tone: Normal ?Excellent healing of residual LLE without any signs of infection. Incision is fully closed.  ? ?TRANSFER: ?Modified independent for transfers with rolling walker and upper extremity support on sitting surface ? ?GAIT: ?Distance walked: Over 200' ?Assistive device utilized: Environmental consultant - 2 wheeled ?Level of assistance: CGA ?Comments:  Initially ambulation practiced in the parallel bars with step-to pattern but pt almost immediately naturally progressed to step-through pattern. Pt able to progress to ambulation in rehab gym as well as outside to vehicle at end of session with a front wheeled walker. Again pt initially started with step-to pattern but progressed to partial step-through and then full step-through pattern. Slight decrease in LLE stance time and RLE step length. Overall pt is very steady with rolling walker.   ? ? ?Posture: ?No gross abnormalities noted in standing or seated posture ? ? ?AROM: Limited bilateral hip IR noted, L knee reaches full extension and flexion. Bilateral hip flexion WNL with some mild restriction in bilateral hip extension ? ? ?LE MMT: ? ?MMT (out of 5) Right ?07/14/2021 Left ?07/14/2021  ?Hip flexion 4+ 4+  ?Hip extension 4- 4-  ?Hip abduction 4- 4-  ?Hip adduction 4 4  ?Hip internal rotation 5 5  ?Hip external rotation 5 5  ?Knee flexion 4- 4  ?Knee extension 5 5  ?Ankle dorsiflexion  5   ?Ankle plantarflexion Active   ?Ankle inversion    ?Ankle eversion    ?(* = pain; Blank rows = not tested) ? ? ?Sensation ?Grossly intact to light touch bilateral LEs as determined by testing dermatomes L2-S2. Proprioception, and hot/cold testing deferred on this date. ? ? ?Reflexes ?Deferred ? ? ?Cranial Nerves ?Deferred ? ? ?Coordination/Cerebellar ?Deferred ? ? ?FUNCTIONAL OUTCOME MEASURES ? ? Results Comments  ?BERG    ?DGI    ?FGA    ?TUG    ?5TSTS    ?6 Minute Walk Test    ?10 Meter Gait Speed    ?(Blank rows = not tested) ? ? ? ?TODAY'S TREATMENT  ?None today ? ? ?PATIENT EDUCATION:  ?Education details: Plan of care  ?Person educated: Patient ?Education method: Explanation ?Education comprehension: verbalized understanding ? ? ?HOME EXERCISE PROGRAM: ?None currently ? ? ?ASSESSMENT: ? ?CLINICAL IMPRESSION: ?Patient is a 61 y.o. who was seen today for physical therapy evaluation s/p L BKA 03/15/2021. Objective impairments  include Abnormal gait, decreased balance, difficulty walking, and decreased strength. These impairments are limiting patient from cleaning, community activity, driving, meal prep, occupation, laundry, yard work, shoppi

## 2021-07-15 NOTE — Therapy (Signed)
?OUTPATIENT PHYSICAL THERAPY PROSTHETIC TREATMENT ? ? ?Patient Name: Rebecca Park ?MRN: [id number] ?DOB:1960/06/11, 61 y.o., female ?Today's Date: 07/18/2021 ? ? PT End of Session - 07/18/21 1101   ? ? Visit Number 2   ? Number of Visits 17   ? Date for PT Re-Evaluation 09/08/21   ? Authorization Type eval: 07/14/21   ? PT Start Time 1530   ? PT Stop Time 1615   ? PT Time Calculation (min) 45 min   ? Equipment Utilized During Treatment Gait belt   ? Activity Tolerance Patient tolerated treatment well   ? Behavior During Therapy [facility] for tasks assessed/performed   ? ?  ?  ? ?  ? ? ? ?Past Medical History:  ?Diagnosis Date  ? Diabetes mellitus without complication ([place])   ? Hypertension   ? PONV (postoperative nausea and vomiting)   ? the day after rt 5th toe amputation surgery  ? ?Past Surgical History:  ?Procedure Laterality Date  ? AMPUTATION Right 11/10/2018  ? Procedure: AMPUTATION RAY (amputation 5th metatarsal Right foot;  Surgeon: Caroline More, DPM;  Location: [facility];  Service: Podiatry;  Laterality: Right;  ? AMPUTATION Left 03/15/2021  ? Procedure: AMPUTATION BELOW KNEE;  Surgeon: Zara Chess, MD;  Location: [facility];  Service: Vascular;  Laterality: Left;  ? AMPUTATION TOE Right 12/05/2018  ? Procedure: RAY RIGHT 4TH AMPUTATION;  Surgeon: Caroline More, DPM;  Location: [place];  Service: Podiatry;  Laterality: Right;  mac with local ?DIABETIC-oral meds  ? BONE BIOPSY Right 11/10/2018  ? Procedure: BONE BIOPSY- Right 4th toe;  Surgeon: Caroline More, DPM;  Location: [facility];  Service: Podiatry;  Laterality: Right;  ? BUNIONECTOMY Bilateral   ? COLONOSCOPY WITH PROPOFOL N/A 09/07/2018  ? Procedure: COLONOSCOPY WITH PROPOFOL;  Surgeon: Lin Landsman, MD;  Location: [facility];  Service: Gastroenterology;  Laterality: N/A;  ? COLONOSCOPY WITH PROPOFOL N/A 09/17/2019  ? Procedure: COLONOSCOPY WITH PROPOFOL;  Surgeon: Lin Landsman, MD;  Location: [facility];  Service:  Gastroenterology;  Laterality: N/A;  ? TEE WITHOUT CARDIOVERSION N/A 03/18/2021  ? Procedure: TRANSESOPHAGEAL ECHOCARDIOGRAM (TEE);  Surgeon: Minna Merritts, MD;  Location: [facility];  Service: Cardiovascular;  Laterality: N/A;  ? ?Patient Active Problem List  ? Diagnosis Date Noted  ? Severe sepsis with acute organ dysfunction due to methicillin susceptible Staphylococcus aureus (MSSA) ([place]) 03/21/2021  ? Status post amputation of toe of right foot ([place]) 04/08/2020  ? Osteomyelitis ([place]) 11/08/2018  ? HLD (hyperlipidemia) 09/17/2018  ? Diabetes mellitus ([place]) 03/27/2018  ? White coat syndrome with diagnosis of hypertension 03/27/2018  ? ? ?PCP: Marinda Elk, MD ? ?REFERRING PROVIDER: Kris Hartmann, NP ? ?REFERRING DIAGNOSIS: Z47.89 (ICD-10-CM) - Encounter for prosthetic gait training ? ?THERAPY DIAG: Difficulty in walking, not elsewhere classified ? ?Muscle weakness (generalized) ? ?ONSET DATE: 03/15/21 ? ?FOLLOW UP APPT WITH PROVIDER: Yes  ? ?FROM INITIAL EVALUATION 07/14/21 ? ?SUBJECTIVE:                                                                                                                                                                                        ? ?  Chief Complaint: L BKA ? ?Pertinent History ?Rebecca Park is a 61 year old female underwent a left below-knee amputation on 03/15/2021 secondary to a LLE ulceration and sepsis. The patient discharged to a rehab facility where she stayed for about 37 days per her report. She worked with therapy at the facility and received her LLE prosthesis about 1.5 weeks ago. She states that she has done some standing and weight shifting in the prosthesis but no true ambulation. She states good healing in the distal LLE and has had minimal pain including very infrequent phantom LLE pain/itching.   ?Pain: No, no pain currently. She has infrequent LLE phantom pain/itching.  ?Numbness/Tingling: No ?Focal Weakness: No, generalized weakness secondary   ?Recent changes in overall health/medication: No ?Prior history of physical therapy for balance:  No ?Falls: Has patient fallen in last 6 months? 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 ?Precautions: None ? ?Weight Bearing Restrictions: No ? ?Living Environment ?Lives with: lives with their spouse, mom lives next door. Spouse works during the day ?Lives in: House/apartment, one level. Has a ramp (a little steep but she could navigate with a wheelchair if necessary); ?Has following equipment at home: Single point cane, Walker - 2 wheeled, Wheelchair (manual), shower chair, bed side commode, Grab bars, and Ramped entry ? ? ?Patient Goals: August 27, 2021 her company has an anniversary celebration and pt would like to be able to be ambulating. Pt would like to be able to return to work.  ? ? ? ?OBJECTIVE:  ? ?Patient Surveys  ?FOTO: 34 ,predicted improvement to 53 ? ? ?Cognition ?Patient is oriented to person, place, and time.  ?Recent memory is intact.  ?Remote memory is intact.  ?Attention span and concentration are intact.  ?Expressive speech is intact.  ?Patient's fund of knowledge is within normal limits for educational level. ?   ? ?Gross Musculoskeletal Assessment ?Tremor: None ?Bulk: Normal ?Tone: Normal ?Excellent healing of residual LLE without any signs of infection. Incision is fully closed.  ? ?TRANSFER: ?Modified independent for transfers with rolling walker and upper extremity support on sitting surface ? ?GAIT: ?Distance walked: Over 200' ?Assistive device utilized: Environmental consultant - 2 wheeled ?Level of assistance: CGA ?Comments: Initially ambulation practiced in the  parallel bars with step-to pattern but pt almost immediately naturally progressed to step-through pattern. Pt able to progress to ambulation in rehab gym as well as outside to vehicle at end of session with a front wheeled walker. Again pt initially started with step-to pattern but progressed to partial step-through and then full step-through pattern. Slight decrease in LLE stance time and RLE step length. Overall pt is very steady with rolling walker.   ? ? ?Posture: ?No gross abnormalities noted in standing or seated posture ? ? ?AROM: Limited bilateral hip IR noted, L knee reaches full extension and flexion. Bilateral hip flexion WNL with some mild restriction in bilateral hip extension ? ? ?LE MMT: ? ?MMT (out of 5) Right ?07/18/2021 Left ?07/18/2021  ?Hip flexion 4+ 4+  ?Hip extension 4- 4-  ?Hip abduction 4- 4-  ?Hip adduction 4 4  ?Hip internal rotation 5 5  ?Hip external rotation 5 5  ?Knee flexion 4- 4  ?Knee extension 5 5  ?Ankle dorsiflexion  5   ?Ankle plantarflexion Active   ?Ankle inversion    ?Ankle eversion    ?(* = pain; Blank rows = not tested) ? ? ?Sensation ?Grossly intact to light touch bilateral LEs as determined by testing dermatomes L2-S2. Proprioception, and hot/cold testing deferred on this date. ? ? ?Reflexes ?Deferred ? ? ?Cranial Nerves ?Deferred ? ? ?Coordination/Cerebellar ?Deferred ? ? ?FUNCTIONAL OUTCOME MEASURES ? ? 07/16/21 Comments  ?BERG    ?DGI    ?FGA    ?TUG 18.8s Above cut-off  ?5TSTS Unable Decreased LE strength  ?2 Minute Walk Test 278' With front wheeled walker   ?10 Meter Gait Speed Self-selected: 15.3s = 0.65 m/s, fastest: 12.2s = 0.9ms Below full community ambulation speed  ?(Blank rows = not tested) ? ?ABC: 41.3% ? ? ?TODAY'S TREATMENT  ? ?SUBJECTIVE: Pt reports that she is doing well today. She has tried to perform some limited ambulation with her rolling walker at home without issue. She was slightly sore after the initial evaluation but no pain. No falls or stumbles  reported.  ? ?PAIN: None ? ? ?Ther-ex  ?Supine L SLR 2 x 10, cues to initiate with quad set; ?Hooklying bridges 2 x 10; ?R sidelying L hip straight leg abduction 2 x 10; ?R sidelying L hip clam with manual resistance from

## 2021-07-16 ENCOUNTER — Ambulatory Visit: Payer: BC Managed Care – PPO

## 2021-07-16 DIAGNOSIS — M6281 Muscle weakness (generalized): Secondary | ICD-10-CM

## 2021-07-16 DIAGNOSIS — R262 Difficulty in walking, not elsewhere classified: Secondary | ICD-10-CM | POA: Diagnosis not present

## 2021-07-20 NOTE — Therapy (Signed)
?OUTPATIENT PHYSICAL THERAPY PROSTHETIC TREATMENT ? ? ?Patient Name: Rebecca Park ?MRN: [id number] ?DOB:1960/09/19, 61 y.o., female ?Today's Date: 07/21/2021 ? ? PT End of Session - 07/21/21 0755   ? ? Visit Number 3   ? Number of Visits 17   ? Date for PT Re-Evaluation 09/08/21   ? Authorization Type eval: 07/14/21   ? PT Start Time [phone]   ? PT Stop Time 0845   ? PT Time Calculation (min) 49 min   ? Equipment Utilized During Treatment Gait belt   ? Activity Tolerance Patient tolerated treatment well   ? Behavior During Therapy [facility] for tasks assessed/performed   ? ?  ?  ? ?  ? ? ? ? ?Past Medical History:  ?Diagnosis Date  ? Diabetes mellitus without complication ([place])   ? Hypertension   ? PONV (postoperative nausea and vomiting)   ? the day after rt 5th toe amputation surgery  ? ?Past Surgical History:  ?Procedure Laterality Date  ? AMPUTATION Right 11/10/2018  ? Procedure: AMPUTATION RAY (amputation 5th metatarsal Right foot;  Surgeon: Caroline More, DPM;  Location: [facility];  Service: Podiatry;  Laterality: Right;  ? AMPUTATION Left 03/15/2021  ? Procedure: AMPUTATION BELOW KNEE;  Surgeon: Zara Chess, MD;  Location: [facility];  Service: Vascular;  Laterality: Left;  ? AMPUTATION TOE Right 12/05/2018  ? Procedure: RAY RIGHT 4TH AMPUTATION;  Surgeon: Caroline More, DPM;  Location: [place];  Service: Podiatry;  Laterality: Right;  mac with local ?DIABETIC-oral meds  ? BONE BIOPSY Right 11/10/2018  ? Procedure: BONE BIOPSY- Right 4th toe;  Surgeon: Caroline More, DPM;  Location: [facility];  Service: Podiatry;  Laterality: Right;  ? BUNIONECTOMY Bilateral   ? COLONOSCOPY WITH PROPOFOL N/A 09/07/2018  ? Procedure: COLONOSCOPY WITH PROPOFOL;  Surgeon: Lin Landsman, MD;  Location: [facility];  Service: Gastroenterology;  Laterality: N/A;  ? COLONOSCOPY WITH PROPOFOL N/A 09/17/2019  ? Procedure: COLONOSCOPY WITH PROPOFOL;  Surgeon: Lin Landsman, MD;  Location: [facility];  Service:  Gastroenterology;  Laterality: N/A;  ? TEE WITHOUT CARDIOVERSION N/A 03/18/2021  ? Procedure: TRANSESOPHAGEAL ECHOCARDIOGRAM (TEE);  Surgeon: Minna Merritts, MD;  Location: [facility];  Service: Cardiovascular;  Laterality: N/A;  ? ?Patient Active Problem List  ? Diagnosis Date Noted  ? Severe sepsis with acute organ dysfunction due to methicillin susceptible Staphylococcus aureus (MSSA) ([place]) 03/21/2021  ? Status post amputation of toe of right foot ([place]) 04/08/2020  ? Osteomyelitis ([place]) 11/08/2018  ? HLD (hyperlipidemia) 09/17/2018  ? Diabetes mellitus ([place]) 03/27/2018  ? White coat syndrome with diagnosis of hypertension 03/27/2018  ? ? ?PCP: Marinda Elk, MD ? ?REFERRING PROVIDER: Kris Hartmann, NP ? ?REFERRING DIAGNOSIS: Z47.89 (ICD-10-CM) - Encounter for prosthetic gait training ? ?THERAPY DIAG: Difficulty in walking, not elsewhere classified ? ?Muscle weakness (generalized) ? ?ONSET DATE: 03/15/21 ? ?FOLLOW UP APPT WITH PROVIDER: Yes  ? ?FROM INITIAL EVALUATION 07/14/21 ? ?SUBJECTIVE:                                                                                                                                                                                        ? ?  Chief Complaint: L BKA ? ?Pertinent History ?Rebecca Park is a 61 year old female underwent a left below-knee amputation on 03/15/2021 secondary to a LLE ulceration and sepsis. The patient discharged to a rehab facility where she stayed for about 37 days per her report. She worked with therapy at the facility and received her LLE prosthesis about 1.5 weeks ago. She states that she has done some standing and weight shifting in the prosthesis but no true ambulation. She states good healing in the distal LLE and has had minimal pain including very infrequent phantom LLE pain/itching.   ?Pain: No, no pain currently. She has infrequent LLE phantom pain/itching.  ?Numbness/Tingling: No ?Focal Weakness: No, generalized weakness secondary   ?Recent changes in overall health/medication: No ?Prior history of physical therapy for balance:  No ?Falls: Has patient fallen in last 6 months? 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 ?Precautions: None ? ?Weight Bearing Restrictions: No ? ?Living Environment ?Lives with: lives with their spouse, mom lives next door. Spouse works during the day ?Lives in: House/apartment, one level. Has a ramp (a little steep but she could navigate with a wheelchair if necessary); ?Has following equipment at home: Single point cane, Walker - 2 wheeled, Wheelchair (manual), shower chair, bed side commode, Grab bars, and Ramped entry ? ? ?Patient Goals: August 27, 2021 her company has an anniversary celebration and pt would like to be able to be ambulating. Pt would like to be able to return to work.  ? ? ? ?OBJECTIVE:  ? ?Patient Surveys  ?FOTO: 34 ,predicted improvement to 53 ? ? ?Cognition ?Patient is oriented to person, place, and time.  ?Recent memory is intact.  ?Remote memory is intact.  ?Attention span and concentration are intact.  ?Expressive speech is intact.  ?Patient's fund of knowledge is within normal limits for educational level. ?   ? ?Gross Musculoskeletal Assessment ?Tremor: None ?Bulk: Normal ?Tone: Normal ?Excellent healing of residual LLE without any signs of infection. Incision is fully closed.  ? ?TRANSFER: ?Modified independent for transfers with rolling walker and upper extremity support on sitting surface ? ?GAIT: ?Distance walked: Over 200' ?Assistive device utilized: Environmental consultant - 2 wheeled ?Level of assistance: CGA ?Comments: Initially ambulation practiced in the  parallel bars with step-to pattern but pt almost immediately naturally progressed to step-through pattern. Pt able to progress to ambulation in rehab gym as well as outside to vehicle at end of session with a front wheeled walker. Again pt initially started with step-to pattern but progressed to partial step-through and then full step-through pattern. Slight decrease in LLE stance time and RLE step length. Overall pt is very steady with rolling walker.   ? ? ?Posture: ?No gross abnormalities noted in standing or seated posture ? ? ?AROM: Limited bilateral hip IR noted, L knee reaches full extension and flexion. Bilateral hip flexion WNL with some mild restriction in bilateral hip extension ? ? ?LE MMT: ? ?MMT (out of 5) Right ?07/21/2021 Left ?07/21/2021  ?Hip flexion 4+ 4+  ?Hip extension 4- 4-  ?Hip abduction 4- 4-  ?Hip adduction 4 4  ?Hip internal rotation 5 5  ?Hip external rotation 5 5  ?Knee flexion 4- 4  ?Knee extension 5 5  ?Ankle dorsiflexion  5   ?Ankle plantarflexion Active   ?Ankle inversion    ?Ankle eversion    ?(* = pain; Blank rows = not tested) ? ? ?Sensation ?Grossly intact to light touch bilateral LEs as determined by testing dermatomes L2-S2. Proprioception, and hot/cold testing deferred on this date. ? ? ?Reflexes ?Deferred ? ? ?Cranial Nerves ?Deferred ? ? ?Coordination/Cerebellar ?Deferred ? ? ?FUNCTIONAL OUTCOME MEASURES ? ? 07/16/21 Comments  ?BERG    ?DGI    ?FGA    ?TUG 18.8s Above cut-off  ?5TSTS Unable Decreased LE strength  ?2 Minute Walk Test 278' With front wheeled walker   ?10 Meter Gait Speed Self-selected: 15.3s = 0.65 m/s, fastest: 12.2s = 0.60ms Below full community ambulation speed  ?(Blank rows = not tested) ? ?ABC: 41.3% ? ? ?TODAY'S TREATMENT  ? ? ?SUBJECTIVE: Pt reports that she is doing well today. She has been ambulating at home with her rolling walker without issue with the exception of some increase in back pain. She reports 2/10 low/mid back pain upon arrival. No falls or  stumbles reported.  ? ?PAIN: 2/10 low/mid back pain; ? ? ?Neuromuscular Re-education  ?Performed BERG with patient who scored 35/56 (see below for specifics) ?Feet together eyes open x 30s; ?Feet together eyes closed

## 2021-07-21 ENCOUNTER — Ambulatory Visit: Payer: BC Managed Care – PPO

## 2021-07-21 DIAGNOSIS — M6281 Muscle weakness (generalized): Secondary | ICD-10-CM | POA: Diagnosis not present

## 2021-07-21 DIAGNOSIS — R262 Difficulty in walking, not elsewhere classified: Secondary | ICD-10-CM | POA: Diagnosis not present

## 2021-07-23 ENCOUNTER — Ambulatory Visit (INDEPENDENT_AMBULATORY_CARE_PROVIDER_SITE_OTHER): Payer: BC Managed Care – PPO | Admitting: Nurse Practitioner

## 2021-07-23 ENCOUNTER — Ambulatory Visit: Payer: BC Managed Care – PPO

## 2021-07-23 ENCOUNTER — Encounter (INDEPENDENT_AMBULATORY_CARE_PROVIDER_SITE_OTHER): Payer: BC Managed Care – PPO

## 2021-07-23 NOTE — Therapy (Incomplete)
?OUTPATIENT PHYSICAL THERAPY PROSTHETIC TREATMENT ? ? ?Patient Name: Rebecca Park ?MRN: [id number] ?DOB:14-Apr-1960, 61 y.o., female ?Today's Date: 07/23/2021 ? ? ? ? ? ? ?Past Medical History:  ?Diagnosis Date  ? Diabetes mellitus without complication ([place])   ? Hypertension   ? PONV (postoperative nausea and vomiting)   ? the day after rt 5th toe amputation surgery  ? ?Past Surgical History:  ?Procedure Laterality Date  ? AMPUTATION Right 11/10/2018  ? Procedure: AMPUTATION RAY (amputation 5th metatarsal Right foot;  Surgeon: Caroline More, DPM;  Location: [facility];  Service: Podiatry;  Laterality: Right;  ? AMPUTATION Left 03/15/2021  ? Procedure: AMPUTATION BELOW KNEE;  Surgeon: Zara Chess, MD;  Location: [facility];  Service: Vascular;  Laterality: Left;  ? AMPUTATION TOE Right 12/05/2018  ? Procedure: RAY RIGHT 4TH AMPUTATION;  Surgeon: Caroline More, DPM;  Location: [place];  Service: Podiatry;  Laterality: Right;  mac with local ?DIABETIC-oral meds  ? BONE BIOPSY Right 11/10/2018  ? Procedure: BONE BIOPSY- Right 4th toe;  Surgeon: Caroline More, DPM;  Location: [facility];  Service: Podiatry;  Laterality: Right;  ? BUNIONECTOMY Bilateral   ? COLONOSCOPY WITH PROPOFOL N/A 09/07/2018  ? Procedure: COLONOSCOPY WITH PROPOFOL;  Surgeon: Lin Landsman, MD;  Location: [facility];  Service: Gastroenterology;  Laterality: N/A;  ? COLONOSCOPY WITH PROPOFOL N/A 09/17/2019  ? Procedure: COLONOSCOPY WITH PROPOFOL;  Surgeon: Lin Landsman, MD;  Location: [facility] END[facility]COPY;  Service: Gastroenterology;  Laterality: N/A;  ? TEE WITHOUT CARDIOVERSION N/A 03/18/2021  ? Procedure: TRANSESOPHAGEAL ECHOCARDIOGRAM (TEE);  Surgeon: Minna Merritts, MD;  Location: [facility];  Service: Cardiovascular;  Laterality: N/A;  ? ?Patient Active Problem List  ? Diagnosis Date Noted  ? Severe sepsis with acute organ dysfunction due to methicillin susceptible Staphylococcus aureus (MSSA) ([place]) 03/21/2021  ? Status post  amputation of toe of right foot ([place]) 04/08/2020  ? Osteomyelitis ([place]) 11/08/2018  ? HLD (hyperlipidemia) 09/17/2018  ? Diabetes mellitus ([place]) 03/27/2018  ? White coat syndrome with diagnosis of hypertension 03/27/2018  ? ? ?PCP: Marinda Elk, MD ? ?REFERRING PROVIDER: Kris Hartmann, NP ? ?REFERRING DIAGNOSIS: Z47.89 (ICD-10-CM) - Encounter for prosthetic gait training ? ?THERAPY DIAG: Difficulty in walking, not elsewhere classified ? ?Muscle weakness (generalized) ? ?ONSET DATE: 03/15/21 ? ?FOLLOW UP APPT WITH PROVIDER: Yes  ? ?FROM INITIAL EVALUATION 07/14/21 ? ?SUBJECTIVE:                                                                                                                                                                                        ? ?Chief Complaint: L BKA ? ?Pertinent History ?  Rebecca Park is a 61 year old female underwent a left below-knee amputation on 03/15/2021 secondary to a LLE ulceration and sepsis. The patient discharged to a rehab facility where she stayed for about 37 days per her report. She worked with therapy at the facility and received her LLE prosthesis about 1.5 weeks ago. She states that she has done some standing and weight shifting in the prosthesis but no true ambulation. She states good healing in the distal LLE and has had minimal pain including very infrequent phantom LLE pain/itching.   ?Pain: No, no pain currently. She has infrequent LLE phantom pain/itching.  ?Numbness/Tingling: No ?Focal Weakness: No, generalized weakness secondary  ?Recent changes in overall health/medication: No ?Prior history of physical therapy for balance:  No ?Falls: Has patient fallen in last 6 months? 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 ?Precautions: None ? ?Weight Bearing Restrictions: No ? ?Living Environment ?Lives with: lives with their spouse, mom lives next door. Spouse works during the day ?Lives in: House/apartment, one level. Has a ramp (a little steep but she could navigate with a wheelchair if necessary); ?Has following equipment at home: Single point cane, Walker - 2 wheeled, Wheelchair (manual), shower chair, bed side commode, Grab bars, and Ramped entry ? ? ?Patient Goals: August 27, 2021 her company has an anniversary celebration and pt would like to be able to be ambulating. Pt would like to be able to return to work.  ? ? ? ?OBJECTIVE:  ? ?Patient Surveys  ?FOTO: 34 ,predicted improvement to 53 ? ? ?Cognition ?Patient is oriented to person, place, and time.  ?Recent memory is intact.  ?Remote memory is intact.  ?Attention span and concentration are intact.  ?Expressive speech is intact.  ?Patient's fund of knowledge is within normal limits for educational level. ?   ? ?Gross Musculoskeletal Assessment ?Tremor: None ?Bulk: Normal ?Tone: Normal ?Excellent healing of residual LLE without any signs of infection. Incision is fully closed.  ? ?TRANSFER: ?Modified independent for transfers with rolling walker and upper extremity support on sitting surface ? ?GAIT: ?Distance walked: Over 200' ?Assistive device utilized: Environmental consultant - 2 wheeled ?Level of assistance: CGA ?Comments: Initially ambulation practiced in the parallel bars with step-to pattern but pt almost immediately naturally progressed to step-through pattern. Pt able to progress to ambulation in rehab gym as well as outside to vehicle at end of session with a front wheeled walker. Again pt initially started with step-to pattern but progressed to partial step-through and then full step-through pattern. Slight decrease in LLE stance time and RLE step length. Overall pt is  very steady with rolling walker.   ? ? ?Posture: ?No gross abnormalities noted in standing or seated posture ? ? ?AROM: Limited bilateral hip IR noted, L knee reaches full extension and flexion. Bilateral hip flexion WNL with some mild restriction in bilateral hip extension ? ? ?LE MMT: ? ?MMT (out of 5) Right ?07/23/2021 Left ?07/23/2021  ?Hip flexion 4+ 4+  ?Hip extension 4- 4-  ?Hip abduction 4- 4-  ?Hip adduction 4 4  ?Hip internal rotation 5 5  ?Hip external rotation 5 5  ?Knee flexion 4- 4  ?Knee extension 5 5  ?Ankle dorsiflexion 5   ?Ankle plantarflexion Active   ?  Ankle inversion    ?Ankle eversion    ?(* = pain; Blank rows = not tested) ? ? ?Sensation ?Grossly intact to light touch bilateral LEs as determined by testing dermatomes L2-S2. Proprioception, and hot/cold testing deferred on this date. ? ? ?Reflexes ?Deferred ? ? ?Cranial Nerves ?Deferred ? ? ?Coordination/Cerebellar ?Deferred ? ? ?FUNCTIONAL OUTCOME MEASURES ? ? 07/16/21 Comments  ?BERG    ?DGI    ?FGA    ?TUG 18.8s Above cut-off  ?5TSTS Unable Decreased LE strength  ?2 Minute Walk Test 278' With front wheeled walker   ?10 Meter Gait Speed Self-selected: 15.3s = 0.65 m/s, fastest: 12.2s = 0.17ms Below full community ambulation speed  ?(Blank rows = not tested) ? ?ABC: 41.3% ? ? ?TODAY'S TREATMENT  ? ? ?SUBJECTIVE: Pt reports that she is doing well today. She has been ambulating at home with her rolling walker without issue with the exception of some increase in back pain. She reports 2/10 low/mid back pain upon arrival. No falls or stumbles reported.  ? ?PAIN: 2/10 low/mid back pain; ? ? ?Neuromuscular Re-education  ?Performed BERG with patient who scored 35/56 (see below for specifics) ?Feet together eyes open x 30s; ?Feet together eyes closed x 60s; ?Half tandem eyes open alternating forward LE x 30s each; ?Alternating 6" step taps with light LUE touch on bars when tapping with RLE x 10 each; ? ? ?Gait Training ?Gait training performed with  patient using single RUE support in parallel bars. Education about proper sequencing with advancing hand in sequence with LLE. Progressed to single point cane in the parallel bars followed by gait in the hallway. [place]

## 2021-07-27 DIAGNOSIS — R531 Weakness: Secondary | ICD-10-CM | POA: Diagnosis not present

## 2021-07-27 DIAGNOSIS — M869 Osteomyelitis, unspecified: Secondary | ICD-10-CM | POA: Diagnosis not present

## 2021-07-28 ENCOUNTER — Ambulatory Visit: Payer: BC Managed Care – PPO | Attending: Nurse Practitioner

## 2021-07-28 DIAGNOSIS — R262 Difficulty in walking, not elsewhere classified: Secondary | ICD-10-CM

## 2021-07-28 DIAGNOSIS — M6281 Muscle weakness (generalized): Secondary | ICD-10-CM | POA: Diagnosis not present

## 2021-07-28 NOTE — Therapy (Signed)
?OUTPATIENT PHYSICAL THERAPY PROSTHETIC TREATMENT ? ? ?Patient Name: Rebecca Park ?MRN: [id number] ?DOB:[birth year], 61 y.o., female ?Today's Date: 07/28/2021 ? ? PT End of Session - 07/28/21 0355   ? ? Visit Number 4   ? Number of Visits 17   ? Date for PT Re-Evaluation 09/08/21   ? Authorization Type eval: 07/14/21   ? PT Start Time 0840   ? PT Stop Time 0925   ? PT Time Calculation (min) 45 min   ? Equipment Utilized During Treatment Gait belt   ? Activity Tolerance Patient tolerated treatment well   ? Behavior During Therapy [facility] for tasks assessed/performed   ? ?  ?  ? ?  ? ? ? ? ? ?Past Medical History:  ?Diagnosis Date  ? Diabetes mellitus without complication ([place])   ? Hypertension   ? PONV (postoperative nausea and vomiting)   ? the day after rt 5th toe amputation surgery  ? ?Past Surgical History:  ?Procedure Laterality Date  ? AMPUTATION Right 11/10/2018  ? Procedure: AMPUTATION RAY (amputation 5th metatarsal Right foot;  Surgeon: Caroline More, DPM;  Location: [facility];  Service: Podiatry;  Laterality: Right;  ? AMPUTATION Left 03/15/2021  ? Procedure: AMPUTATION BELOW KNEE;  Surgeon: Zara Chess, MD;  Location: [facility];  Service: Vascular;  Laterality: Left;  ? AMPUTATION TOE Right 12/05/2018  ? Procedure: RAY RIGHT 4TH AMPUTATION;  Surgeon: Caroline More, DPM;  Location: [place];  Service: Podiatry;  Laterality: Right;  mac with local ?DIABETIC-oral meds  ? BONE BIOPSY Right 11/10/2018  ? Procedure: BONE BIOPSY- Right 4th toe;  Surgeon: Caroline More, DPM;  Location: [facility];  Service: Podiatry;  Laterality: Right;  ? BUNIONECTOMY Bilateral   ? COLONOSCOPY WITH PROPOFOL N/A 09/07/2018  ? Procedure: COLONOSCOPY WITH PROPOFOL;  Surgeon: Lin Landsman, MD;  Location: [facility];  Service: Gastroenterology;  Laterality: N/A;  ? COLONOSCOPY WITH PROPOFOL N/A 09/17/2019  ? Procedure: COLONOSCOPY WITH PROPOFOL;  Surgeon: Lin Landsman, MD;  Location: [facility]COPY;  Service:  Gastroenterology;  Laterality: N/A;  ? TEE WITHOUT CARDIOVERSION N/A 03/18/2021  ? Procedure: TRANSESOPHAGEAL ECHOCARDIOGRAM (TEE);  Surgeon: Minna Merritts, MD;  Location: [facility];  Service: Cardiovascular;  Laterality: N/A;  ? ?Patient Active Problem List  ? Diagnosis Date Noted  ? Severe sepsis with acute organ dysfunction due to methicillin susceptible Staphylococcus aureus (MSSA) ([place]) 03/21/2021  ? Status post amputation of toe of right foot ([place]) 04/08/2020  ? Osteomyelitis ([place]) 11/08/2018  ? HLD (hyperlipidemia) 09/17/2018  ? Diabetes mellitus ([place]) 03/27/2018  ? White coat syndrome with diagnosis of hypertension 03/27/2018  ? ? ?PCP: Marinda Elk, MD ? ?REFERRING PROVIDER: Kris Hartmann, NP ? ?REFERRING DIAGNOSIS: Z47.89 (ICD-10-CM) - Encounter for prosthetic gait training ? ?THERAPY DIAG: Difficulty in walking, not elsewhere classified ? ?Muscle weakness (generalized) ? ?ONSET DATE: 03/15/21 ? ?FOLLOW UP APPT WITH PROVIDER: Yes  ? ?FROM INITIAL EVALUATION 07/14/21 ? ?SUBJECTIVE:                                                                                                                                                                                        ? ?  Chief Complaint: L BKA ? ?Pertinent History ?Rebecca Park is a 61 year old female underwent a left below-knee amputation on 03/15/2021 secondary to a LLE ulceration and sepsis. The patient discharged to a rehab facility where she stayed for about 37 days per her report. She worked with therapy at the facility and received her LLE prosthesis about 1.5 weeks ago. She states that she has done some standing and weight shifting in the prosthesis but no true ambulation. She states good healing in the distal LLE and has had minimal pain including very infrequent phantom LLE pain/itching.   ?Pain: No, no pain currently. She has infrequent LLE phantom pain/itching.  ?Numbness/Tingling: No ?Focal Weakness: No, generalized weakness secondary   ?Recent changes in overall health/medication: No ?Prior history of physical therapy for balance:  No ?Falls: Has patient fallen in last 6 months? 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 ?Precautions: None ? ?Weight Bearing Restrictions: No ? ?Living Environment ?Lives with: lives with their spouse, mom lives next door. Spouse works during the day ?Lives in: House/apartment, one level. Has a ramp (a little steep but she could navigate with a wheelchair if necessary); ?Has following equipment at home: Single point cane, Walker - 2 wheeled, Wheelchair (manual), shower chair, bed side commode, Grab bars, and Ramped entry ? ? ?Patient Goals: August 27, 2021 her company has an anniversary celebration and pt would like to be able to be ambulating. Pt would like to be able to return to work.  ? ? ? ?OBJECTIVE:  ? ?Patient Surveys  ?FOTO: 34 ,predicted improvement to 53 ? ? ?Cognition ?Patient is oriented to person, place, and time.  ?Recent memory is intact.  ?Remote memory is intact.  ?Attention span and concentration are intact.  ?Expressive speech is intact.  ?Patient's fund of knowledge is within normal limits for educational level. ?   ? ?Gross Musculoskeletal Assessment ?Tremor: None ?Bulk: Normal ?Tone: Normal ?Excellent healing of residual LLE without any signs of infection. Incision is fully closed.  ? ?TRANSFER: ?Modified independent for transfers with rolling walker and upper extremity support on sitting surface ? ?GAIT: ?Distance walked: Over 200' ?Assistive device utilized: Environmental consultant - 2 wheeled ?Level of assistance: CGA ?Comments: Initially ambulation practiced in the  parallel bars with step-to pattern but pt almost immediately naturally progressed to step-through pattern. Pt able to progress to ambulation in rehab gym as well as outside to vehicle at end of session with a front wheeled walker. Again pt initially started with step-to pattern but progressed to partial step-through and then full step-through pattern. Slight decrease in LLE stance time and RLE step length. Overall pt is very steady with rolling walker.   ? ? ?Posture: ?No gross abnormalities noted in standing or seated posture ? ? ?AROM: Limited bilateral hip IR noted, L knee reaches full extension and flexion. Bilateral hip flexion WNL with some mild restriction in bilateral hip extension ? ? ?LE MMT: ? ?MMT (out of 5) Right ?07/28/2021 Left ?07/28/2021  ?Hip flexion 4+ 4+  ?Hip extension 4- 4-  ?Hip abduction 4- 4-  ?Hip adduction 4 4  ?Hip internal rotation 5 5  ?Hip external rotation 5 5  ?Knee flexion 4- 4  ?Knee extension 5 5  ?Ankle dorsiflexion  5   ?Ankle plantarflexion Active   ?Ankle inversion    ?Ankle eversion    ?(* = pain; Blank rows = not tested) ? ? ?Sensation ?Grossly intact to light touch bilateral LEs as determined by testing dermatomes L2-S2. Proprioception, and hot/cold testing deferred on this date. ? ? ?Reflexes ?Deferred ? ? ?Cranial Nerves ?Deferred ? ? ?Coordination/Cerebellar ?Deferred ? ? ?FUNCTIONAL OUTCOME MEASURES ? ? 07/16/21 07/21/21 Comments  ?BERG  35/56 High fall risk  ?DGI     ?FGA     ?TUG 18.8s  Above cut-off  ?5TSTS Unable  Decreased LE strength  ?2 Minute Walk Test 278'  With front wheeled walker   ?10 Meter Gait Speed Self-selected: 15.3s = 0.65 m/s, fastest: 12.2s = 0.65ms  Below full community ambulation speed  ?(Blank rows = not tested) ? ?ABC: 41.3% ? ? ?TODAY'S TREATMENT  ? ? ?SUBJECTIVE: Pt reports that she is doing well today. She had a stomach bug last week so was unable to be very active but is feeling better today. Back pain has resolved and no pain reported upon  arrival. She has been ambulating at home with her rolling walker without issue. No falls or stumbles reported.  ? ?PAIN: No ? ? ?Gait Training ?Gait training performed with patient using single RUE support in rehab gym, hal

## 2021-07-30 ENCOUNTER — Ambulatory Visit: Payer: BC Managed Care – PPO

## 2021-07-30 DIAGNOSIS — R21 Rash and other nonspecific skin eruption: Secondary | ICD-10-CM | POA: Diagnosis not present

## 2021-07-30 DIAGNOSIS — M6281 Muscle weakness (generalized): Secondary | ICD-10-CM | POA: Diagnosis not present

## 2021-07-30 DIAGNOSIS — R262 Difficulty in walking, not elsewhere classified: Secondary | ICD-10-CM

## 2021-07-30 NOTE — Therapy (Signed)
?OUTPATIENT PHYSICAL THERAPY PROSTHETIC TREATMENT ? ? ?Patient Name: Rebecca Park ?MRN: [id number] ?DOB:12/30/1960, 61 y.o., female ?Today's Date: 07/30/2021 ? ? PT End of Session - 07/30/21 1539   ? ? Visit Number 5   ? Number of Visits 17   ? Date for PT Re-Evaluation 09/08/21   ? Authorization Type eval: 07/14/21   ? PT Start Time 1530   ? PT Stop Time 1615   ? PT Time Calculation (min) 45 min   ? Equipment Utilized During Treatment Gait belt   ? Activity Tolerance Patient tolerated treatment well   ? Behavior During Therapy [facility] for tasks assessed/performed   ? ?  ?  ? ?  ? ? ?Past Medical History:  ?Diagnosis Date  ? Diabetes mellitus without complication ([place])   ? Hypertension   ? PONV (postoperative nausea and vomiting)   ? the day after rt 5th toe amputation surgery  ? ?Past Surgical History:  ?Procedure Laterality Date  ? AMPUTATION Right 11/10/2018  ? Procedure: AMPUTATION RAY (amputation 5th metatarsal Right foot;  Surgeon: Caroline More, DPM;  Location: [facility];  Service: Podiatry;  Laterality: Right;  ? AMPUTATION Left 03/15/2021  ? Procedure: AMPUTATION BELOW KNEE;  Surgeon: Zara Chess, MD;  Location: [facility];  Service: Vascular;  Laterality: Left;  ? AMPUTATION TOE Right 12/05/2018  ? Procedure: RAY RIGHT 4TH AMPUTATION;  Surgeon: Caroline More, DPM;  Location: [place];  Service: Podiatry;  Laterality: Right;  mac with local ?DIABETIC-oral meds  ? BONE BIOPSY Right 11/10/2018  ? Procedure: BONE BIOPSY- Right 4th toe;  Surgeon: Caroline More, DPM;  Location: [facility];  Service: Podiatry;  Laterality: Right;  ? BUNIONECTOMY Bilateral   ? COLONOSCOPY WITH PROPOFOL N/A 09/07/2018  ? Procedure: COLONOSCOPY WITH PROPOFOL;  Surgeon: Lin Landsman, MD;  Location: [facility];  Service: Gastroenterology;  Laterality: N/A;  ? COLONOSCOPY WITH PROPOFOL N/A 09/17/2019  ? Procedure: COLONOSCOPY WITH PROPOFOL;  Surgeon: Lin Landsman, MD;  Location: [facility] END[facility]COPY;  Service: Gastroenterology;   Laterality: N/A;  ? TEE WITHOUT CARDIOVERSION N/A 03/18/2021  ? Procedure: TRANSESOPHAGEAL ECHOCARDIOGRAM (TEE);  Surgeon: Minna Merritts, MD;  Location: [facility];  Service: Cardiovascular;  Laterality: N/A;  ? ?Patient Active Problem List  ? Diagnosis Date Noted  ? Severe sepsis with acute organ dysfunction due to methicillin susceptible Staphylococcus aureus (MSSA) ([place]) 03/21/2021  ? Status post amputation of toe of right foot ([place]) 04/08/2020  ? Osteomyelitis ([place]) 11/08/2018  ? HLD (hyperlipidemia) 09/17/2018  ? Diabetes mellitus ([place]) 03/27/2018  ? White coat syndrome with diagnosis of hypertension 03/27/2018  ? ? ?PCP: Marinda Elk, MD ? ?REFERRING PROVIDER: Kris Hartmann, NP ? ?REFERRING DIAGNOSIS: Z47.89 (ICD-10-CM) - Encounter for prosthetic gait training ? ?THERAPY DIAG: Difficulty in walking, not elsewhere classified ? ?Muscle weakness (generalized) ? ?ONSET DATE: 03/15/21 ? ?FOLLOW UP APPT WITH PROVIDER: Yes  ? ?FROM INITIAL EVALUATION 07/14/21 ? ?SUBJECTIVE:                                                                                                                                                                                        ? ?  Chief Complaint: L BKA ? ?Pertinent History ?Rebecca Park is a 61 year old female underwent a left below-knee amputation on 03/15/2021 secondary to a LLE ulceration and sepsis. The patient discharged to a rehab facility where she stayed for about 37 days per her report. She worked with therapy at the facility and received her LLE prosthesis about 1.5 weeks ago. She states that she has done some standing and weight shifting in the prosthesis but no true ambulation. She states good healing in the distal LLE and has had minimal pain including very infrequent phantom LLE pain/itching.   ?Pain: No, no pain currently. She has infrequent LLE phantom pain/itching.  ?Numbness/Tingling: No ?Focal Weakness: No, generalized weakness secondary  ?Recent changes in  overall health/medication: No ?Prior history of physical therapy for balance:  No ?Falls: Has patient fallen in last 6 months? 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 ?Precautions: None ? ?Weight Bearing Restrictions: No ? ?Living Environment ?Lives with: lives with their spouse, mom lives next door. Spouse works during the day ?Lives in: House/apartment, one level. Has a ramp (a little steep but she could navigate with a wheelchair if necessary); ?Has following equipment at home: Single point cane, Walker - 2 wheeled, Wheelchair (manual), shower chair, bed side commode, Grab bars, and Ramped entry ? ? ?Patient Goals: August 27, 2021 her company has an anniversary celebration and pt would like to be able to be ambulating. Pt would like to be able to return to work.  ? ? ? ?OBJECTIVE:  ? ?Patient Surveys  ?FOTO: 34 ,predicted improvement to 53 ? ? ?Cognition ?Patient is oriented to person, place, and time.  ?Recent memory is intact.  ?Remote memory is intact.  ?Attention span and concentration are intact.  ?Expressive speech is intact.  ?Patient's fund of knowledge is within normal limits for educational level. ?   ? ?Gross Musculoskeletal Assessment ?Tremor: None ?Bulk: Normal ?Tone: Normal ?Excellent healing of residual LLE without any signs of infection. Incision is fully closed.  ? ?TRANSFER: ?Modified independent for transfers with rolling walker and upper extremity support on sitting surface ? ?GAIT: ?Distance walked: Over 200' ?Assistive device utilized: Environmental consultant - 2 wheeled ?Level of assistance: CGA ?Comments: Initially ambulation practiced in the parallel bars with  step-to pattern but pt almost immediately naturally progressed to step-through pattern. Pt able to progress to ambulation in rehab gym as well as outside to vehicle at end of session with a front wheeled walker. Again pt initially started with step-to pattern but progressed to partial step-through and then full step-through pattern. Slight decrease in LLE stance time and RLE step length. Overall pt is very steady with rolling walker.   ? ? ?Posture: ?No gross abnormalities noted in standing or seated posture ? ? ?AROM: Limited bilateral hip IR noted, L knee reaches full extension and flexion. Bilateral hip flexion WNL with some mild restriction in bilateral hip extension ? ? ?LE MMT: ? ?MMT (out of 5) Right ?07/30/2021 Left ?07/30/2021  ?Hip flexion 4+ 4+  ?Hip extension 4- 4-  ?Hip abduction 4- 4-  ?Hip adduction 4 4  ?Hip internal rotation 5 5  ?Hip external rotation 5 5  ?Knee flexion 4- 4  ?Knee extension 5 5  ?Ankle dorsiflexion  5   ?Ankle plantarflexion Active   ?Ankle inversion    ?Ankle eversion    ?(* = pain; Blank rows = not tested) ? ? ?Sensation ?Grossly intact to light touch bilateral LEs as determined by testing dermatomes L2-S2. Proprioception, and hot/cold testing deferred on this date. ? ? ?Reflexes ?Deferred ? ? ?Cranial Nerves ?Deferred ? ? ?Coordination/Cerebellar ?Deferred ? ? ?FUNCTIONAL OUTCOME MEASURES ? ? 07/16/21 07/21/21 Comments  ?BERG  35/56 High fall risk  ?DGI     ?FGA     ?TUG 18.8s  Above cut-off  ?5TSTS Unable  Decreased LE strength  ?2 Minute Walk Test 278'  With front wheeled walker   ?10 Meter Gait Speed Self-selected: 15.3s = 0.65 m/s, fastest: 12.2s = 0.48ms  Below full community ambulation speed  ?(Blank rows = not tested) ? ?ABC: 41.3% ? ? ?TODAY'S TREATMENT  ? ? ?SUBJECTIVE: Pt reports that she is doing well today. Back pain has improved and she reports that she was diagnosed with shingles. She never erupted with blisters and is not having any discharge. Her MD confirmed that it  was safe for her to go to therapy. She has been ambulating at home with her rolling walker without issue. No falls or stumbles reported.  ? ?PAIN: No ? ? ?Gait Training ?Gait training performed with patie

## 2021-08-04 ENCOUNTER — Ambulatory Visit: Payer: BC Managed Care – PPO

## 2021-08-04 DIAGNOSIS — M6281 Muscle weakness (generalized): Secondary | ICD-10-CM

## 2021-08-04 DIAGNOSIS — R262 Difficulty in walking, not elsewhere classified: Secondary | ICD-10-CM | POA: Diagnosis not present

## 2021-08-04 NOTE — Therapy (Signed)
?OUTPATIENT PHYSICAL THERAPY PROSTHETIC TREATMENT ? ? ?Patient Name: Rebecca Park ?MRN: [id number] ?DOB:1960/04/16, 61 y.o., female ?Today's Date: 08/04/2021 ? ? PT End of Session - 08/04/21 1448   ? ? Visit Number 6   ? Number of Visits 17   ? Date for PT Re-Evaluation 09/08/21   ? Authorization Type eval: 07/14/21   ? PT Start Time 9211   ? PT Stop Time 1530   ? PT Time Calculation (min) 45 min   ? Equipment Utilized During Treatment Gait belt   ? Activity Tolerance Patient tolerated treatment well   ? Behavior During Therapy [facility] for tasks assessed/performed   ? ?  ?  ? ?  ? ? ? ?Past Medical History:  ?Diagnosis Date  ? Diabetes mellitus without complication ([place])   ? Hypertension   ? PONV (postoperative nausea and vomiting)   ? the day after rt 5th toe amputation surgery  ? ?Past Surgical History:  ?Procedure Laterality Date  ? AMPUTATION Right 11/10/2018  ? Procedure: AMPUTATION RAY (amputation 5th metatarsal Right foot;  Surgeon: Caroline More, DPM;  Location: [facility];  Service: Podiatry;  Laterality: Right;  ? AMPUTATION Left 03/15/2021  ? Procedure: AMPUTATION BELOW KNEE;  Surgeon: Zara Chess, MD;  Location: [facility];  Service: Vascular;  Laterality: Left;  ? AMPUTATION TOE Right 12/05/2018  ? Procedure: RAY RIGHT 4TH AMPUTATION;  Surgeon: Caroline More, DPM;  Location: [facility];  Service: Podiatry;  Laterality: Right;  mac with local ?DIABETIC-oral meds  ? BONE BIOPSY Right 11/10/2018  ? Procedure: BONE BIOPSY- Right 4th toe;  Surgeon: Caroline More, DPM;  Location: [facility];  Service: Podiatry;  Laterality: Right;  ? BUNIONECTOMY Bilateral   ? COLONOSCOPY WITH PROPOFOL N/A 09/07/2018  ? Procedure: COLONOSCOPY WITH PROPOFOL;  Surgeon: Lin Landsman, MD;  Location: [facility] ENDOSCOPY;  Service: Gastroenterology;  Laterality: N/A;  ? COLONOSCOPY WITH PROPOFOL N/A 09/17/2019  ? Procedure: COLONOSCOPY WITH PROPOFOL;  Surgeon: Lin Landsman, MD;  Location: [facility];  Service:  Gastroenterology;  Laterality: N/A;  ? TEE WITHOUT CARDIOVERSION N/A 03/18/2021  ? Procedure: TRANSESOPHAGEAL ECHOCARDIOGRAM (TEE);  Surgeon: Minna Merritts, MD;  Location: [facility];  Service: Cardiovascular;  Laterality: N/A;  ? ?Patient Active Problem List  ? Diagnosis Date Noted  ? Severe sepsis with acute organ dysfunction due to methicillin susceptible Staphylococcus aureus (MSSA) ([place]) 03/21/2021  ? Status post amputation of toe of right foot ([place]) 04/08/2020  ? Osteomyelitis ([place]) 11/08/2018  ? HLD (hyperlipidemia) 09/17/2018  ? Diabetes mellitus ([place]) 03/27/2018  ? White coat syndrome with diagnosis of hypertension 03/27/2018  ? ? ?PCP: Marinda Elk, MD ? ?REFERRING PROVIDER: Kris Hartmann, NP ? ?REFERRING DIAGNOSIS: Z47.89 (ICD-10-CM) - Encounter for prosthetic gait training ? ?THERAPY DIAG: Difficulty in walking, not elsewhere classified ? ?Muscle weakness (generalized) ? ?ONSET DATE: 03/15/21 ? ?FOLLOW UP APPT WITH PROVIDER: Yes  ? ?FROM INITIAL EVALUATION 07/14/21 ? ?SUBJECTIVE:                                                                                                                                                                                        ? ?  Chief Complaint: L BKA ? ?Pertinent History ?Rebecca Park is a 61 year old female underwent a left below-knee amputation on 03/15/2021 secondary to a LLE ulceration and sepsis. The patient discharged to a rehab facility where she stayed for about 37 days per her report. She worked with therapy at the facility and received her LLE prosthesis about 1.5 weeks ago. She states that she has done some standing and weight shifting in the prosthesis but no true ambulation. She states good healing in the distal LLE and has had minimal pain including very infrequent phantom LLE pain/itching.   ?Pain: No, no pain currently. She has infrequent LLE phantom pain/itching.  ?Numbness/Tingling: No ?Focal Weakness: No, generalized weakness secondary   ?Recent changes in overall health/medication: No ?Prior history of physical therapy for balance:  No ?Falls: Has patient fallen in last 6 months? 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 ?Precautions: None ? ?Weight Bearing Restrictions: No ? ?Living Environment ?Lives with: lives with their spouse, mom lives next door. Spouse works during the day ?Lives in: House/apartment, one level. Has a ramp (a little steep but she could navigate with a wheelchair if necessary); ?Has following equipment at home: Single point cane, Walker - 2 wheeled, Wheelchair (manual), shower chair, bed side commode, Grab bars, and Ramped entry ? ? ?Patient Goals: August 27, 2021 her company has an anniversary celebration and pt would like to be able to be ambulating. Pt would like to be able to return to work.  ? ? ? ?OBJECTIVE:  ? ?Patient Surveys  ?FOTO: 34 ,predicted improvement to 53 ? ? ?Cognition ?Patient is oriented to person, place, and time.  ?Recent memory is intact.  ?Remote memory is intact.  ?Attention span and concentration are intact.  ?Expressive speech is intact.  ?Patient's fund of knowledge is within normal limits for educational level. ?   ? ?Gross Musculoskeletal Assessment ?Tremor: None ?Bulk: Normal ?Tone: Normal ?Excellent healing of residual LLE without any signs of infection. Incision is fully closed.  ? ?TRANSFER: ?Modified independent for transfers with rolling walker and upper extremity support on sitting surface ? ?GAIT: ?Distance walked: Over 200' ?Assistive device utilized: Environmental consultant - 2 wheeled ?Level of assistance: CGA ?Comments: Initially ambulation practiced in the  parallel bars with step-to pattern but pt almost immediately naturally progressed to step-through pattern. Pt able to progress to ambulation in rehab gym as well as outside to vehicle at end of session with a front wheeled walker. Again pt initially started with step-to pattern but progressed to partial step-through and then full step-through pattern. Slight decrease in LLE stance time and RLE step length. Overall pt is very steady with rolling walker.   ? ? ?Posture: ?No gross abnormalities noted in standing or seated posture ? ? ?AROM: Limited bilateral hip IR noted, L knee reaches full extension and flexion. Bilateral hip flexion WNL with some mild restriction in bilateral hip extension ? ? ?LE MMT: ? ?MMT (out of 5) Right ?08/04/2021 Left ?08/04/2021  ?Hip flexion 4+ 4+  ?Hip extension 4- 4-  ?Hip abduction 4- 4-  ?Hip adduction 4 4  ?Hip internal rotation 5 5  ?Hip external rotation 5 5  ?Knee flexion 4- 4  ?Knee extension 5 5  ?Ankle dorsiflexion  5   ?Ankle plantarflexion Active   ?Ankle inversion    ?Ankle eversion    ?(* = pain; Blank rows = not tested) ? ? ?Sensation ?Grossly intact to light touch bilateral LEs as determined by testing dermatomes L2-S2. Proprioception, and hot/cold testing deferred on this date. ? ? ?Reflexes ?Deferred ? ? ?Cranial Nerves ?Deferred ? ? ?Coordination/Cerebellar ?Deferred ? ? ?FUNCTIONAL OUTCOME MEASURES ? ? 07/16/21 07/21/21 Comments  ?BERG  35/56 High fall risk  ?DGI     ?FGA     ?TUG 18.8s  Above cut-off  ?5TSTS Unable  Decreased LE strength  ?2 Minute Walk Test 278'  With front wheeled walker   ?10 Meter Gait Speed Self-selected: 15.3s = 0.65 m/s, fastest: 12.2s = 0.70ms  Below full community ambulation speed  ?(Blank rows = not tested) ? ?ABC: 41.3% ? ? ?TODAY'S TREATMENT  ? ? ?SUBJECTIVE: Pt reports that she is doing well today. No pain upon arrival however she has been having more pain related to her shingles recently especially at night. She never erupted with blisters and  is not having any discharge. She has been ambulating at home with her rolling walker without issue. No falls or stumbles reported. She would like to work on stairs with one handrail and a cane so she can get rid of her r

## 2021-08-05 NOTE — Therapy (Signed)
?OUTPATIENT PHYSICAL THERAPY PROSTHETIC TREATMENT ? ?Patient Name: Rebecca Park ?MRN: [id number] ?DOB:Aug 22, 1960, 61 y.o., female ?Today's Date: 08/06/2021 ? ? PT End of Session - 08/06/21 1436   ? ? Visit Number 7   ? Number of Visits 17   ? Date for PT Re-Evaluation 09/08/21   ? Authorization Type eval: 07/14/21   ? PT Start Time 4098   ? PT Stop Time 1530   ? PT Time Calculation (min) 45 min   ? Equipment Utilized During Treatment Gait belt   ? Activity Tolerance Patient tolerated treatment well   ? Behavior During Therapy [facility] for tasks assessed/performed   ? ?  ?  ? ?  ? ? ?Past Medical History:  ?Diagnosis Date  ? Diabetes mellitus without complication ([place])   ? Hypertension   ? PONV (postoperative nausea and vomiting)   ? the day after rt 5th toe amputation surgery  ? ?Past Surgical History:  ?Procedure Laterality Date  ? AMPUTATION Right 11/10/2018  ? Procedure: AMPUTATION RAY (amputation 5th metatarsal Right foot;  Surgeon: Caroline More, DPM;  Location: [facility];  Service: Podiatry;  Laterality: Right;  ? AMPUTATION Left 03/15/2021  ? Procedure: AMPUTATION BELOW KNEE;  Surgeon: Zara Chess, MD;  Location: [facility];  Service: Vascular;  Laterality: Left;  ? AMPUTATION TOE Right 12/05/2018  ? Procedure: RAY RIGHT 4TH AMPUTATION;  Surgeon: Caroline More, DPM;  Location: [facility];  Service: Podiatry;  Laterality: Right;  mac with local ?DIABETIC-oral meds  ? BONE BIOPSY Right 11/10/2018  ? Procedure: BONE BIOPSY- Right 4th toe;  Surgeon: Caroline More, DPM;  Location: [facility];  Service: Podiatry;  Laterality: Right;  ? BUNIONECTOMY Bilateral   ? COLONOSCOPY WITH PROPOFOL N/A 09/07/2018  ? Procedure: COLONOSCOPY WITH PROPOFOL;  Surgeon: Lin Landsman, MD;  Location: [facility] ENDOSCOPY;  Service: Gastroenterology;  Laterality: N/A;  ? COLONOSCOPY WITH PROPOFOL N/A 09/17/2019  ? Procedure: COLONOSCOPY WITH PROPOFOL;  Surgeon: Lin Landsman, MD;  Location: [facility] ENDOSCOPY;  Service: Gastroenterology;   Laterality: N/A;  ? TEE WITHOUT CARDIOVERSION N/A 03/18/2021  ? Procedure: TRANSESOPHAGEAL ECHOCARDIOGRAM (TEE);  Surgeon: Minna Merritts, MD;  Location: [facility];  Service: Cardiovascular;  Laterality: N/A;  ? ?Patient Active Problem List  ? Diagnosis Date Noted  ? Severe sepsis with acute organ dysfunction due to methicillin susceptible Staphylococcus aureus (MSSA) ([place]) 03/21/2021  ? Status post amputation of toe of right foot ([place]) 04/08/2020  ? Osteomyelitis ([place]) 11/08/2018  ? HLD (hyperlipidemia) 09/17/2018  ? Diabetes mellitus ([place]) 03/27/2018  ? White coat syndrome with diagnosis of hypertension 03/27/2018  ? ? ?PCP: Marinda Elk, MD ? ?REFERRING PROVIDER: Kris Hartmann, NP ? ?REFERRING DIAGNOSIS: Z47.89 (ICD-10-CM) - Encounter for prosthetic gait training ? ?THERAPY DIAG: Difficulty in walking, not elsewhere classified ? ?Muscle weakness (generalized) ? ?ONSET DATE: 03/15/21 ? ?FOLLOW UP APPT WITH PROVIDER: Yes  ? ?FROM INITIAL EVALUATION 07/14/21 ? ?SUBJECTIVE:                                                                                                                                                                                        ? ?  Chief Complaint: L BKA ? ?Pertinent History ?Rebecca Park is a 61 year old female underwent a left below-knee amputation on 03/15/2021 secondary to a LLE ulceration and sepsis. The patient discharged to a rehab facility where she stayed for about 37 days per her report. She worked with therapy at the facility and received her LLE prosthesis about 1.5 weeks ago. She states that she has done some standing and weight shifting in the prosthesis but no true ambulation. She states good healing in the distal LLE and has had minimal pain including very infrequent phantom LLE pain/itching.   ?Pain: No, no pain currently. She has infrequent LLE phantom pain/itching.  ?Numbness/Tingling: No ?Focal Weakness: No, generalized weakness secondary  ?Recent changes in  overall health/medication: No ?Prior history of physical therapy for balance:  No ?Falls: Has patient fallen in last 6 months? 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 ?Precautions: None ? ?Weight Bearing Restrictions: No ? ?Living Environment ?Lives with: lives with their spouse, mom lives next door. Spouse works during the day ?Lives in: House/apartment, one level. Has a ramp (a little steep but she could navigate with a wheelchair if necessary); ?Has following equipment at home: Single point cane, Walker - 2 wheeled, Wheelchair (manual), shower chair, bed side commode, Grab bars, and Ramped entry ? ? ?Patient Goals: August 27, 2021 her company has an anniversary celebration and pt would like to be able to be ambulating. Pt would like to be able to return to work.  ? ? ? ?OBJECTIVE:  ? ?Patient Surveys  ?FOTO: 34 ,predicted improvement to 53 ? ? ?Cognition ?Patient is oriented to person, place, and time.  ?Recent memory is intact.  ?Remote memory is intact.  ?Attention span and concentration are intact.  ?Expressive speech is intact.  ?Patient's fund of knowledge is within normal limits for educational level. ?   ?Gross Musculoskeletal Assessment ?Tremor: None ?Bulk: Normal ?Tone: Normal ?Excellent healing of residual LLE without any signs of infection. Incision is fully closed.  ? ?TRANSFER: ?Modified independent for transfers with rolling walker and upper extremity support on sitting surface ? ?GAIT: ?Distance walked: Over 200' ?Assistive device utilized: Environmental consultant - 2 wheeled ?Level of assistance: CGA ?Comments: Initially ambulation practiced in the parallel bars with  step-to pattern but pt almost immediately naturally progressed to step-through pattern. Pt able to progress to ambulation in rehab gym as well as outside to vehicle at end of session with a front wheeled walker. Again pt initially started with step-to pattern but progressed to partial step-through and then full step-through pattern. Slight decrease in LLE stance time and RLE step length. Overall pt is very steady with rolling walker.   ? ? ?Posture: ?No gross abnormalities noted in standing or seated posture ? ? ?AROM: Limited bilateral hip IR noted, L knee reaches full extension and flexion. Bilateral hip flexion WNL with some mild restriction in bilateral hip extension ? ? ?LE MMT: ? ?MMT (out of 5) Right ?08/06/2021 Left ?08/06/2021  ?Hip flexion 4+ 4+  ?Hip extension 4- 4-  ?Hip abduction 4- 4-  ?Hip adduction 4 4  ?Hip internal rotation 5 5  ?Hip external rotation 5 5  ?Knee flexion 4- 4  ?Knee extension 5 5  ?Ankle dorsiflexion 5   ?  Ankle plantarflexion Active   ?Ankle inversion    ?Ankle eversion    ?(* = pain; Blank rows = not tested) ? ? ?Sensation ?Grossly intact to light touch bilateral LEs as determined by testing dermatomes L2-S2. Proprioception, and hot/cold testing deferred on this date. ? ? ?Reflexes ?Deferred ? ? ?Cranial Nerves ?Deferred ? ? ?Coordination/Cerebellar ?Deferred ? ? ?FUNCTIONAL OUTCOME MEASURES ? ? 07/16/21 07/21/21 Comments  ?BERG  35/56 High fall risk  ?DGI     ?FGA     ?TUG 18.8s  Above cut-off  ?5TSTS Unable  Decreased LE strength  ?2 Minute Walk Test 278'  With front wheeled walker   ?10 Meter Gait Speed Self-selected: 15.3s = 0.65 m/s, fastest: 12.2s = 0.51ms  Below full community ambulation speed  ?(Blank rows = not tested) ? ?ABC: 41.3% ? ? ?TODAY'S TREATMENT  ? ? ?SUBJECTIVE: Pt reports that she is doing well today. No pain upon arrival. She has tried some limitation ambulation around the house with her single point cane. No falls or stumbles reported. ? ?PAIN:  No ? ?Neuromuscular Re-education  ?NuStep L2-3 x 5 minutes BLE only (seat 9) for warm-up during interval history with therapist monitoring fatigue; ?Ambulation performed with patient using single RUE support in rehab gym, ha

## 2021-08-06 ENCOUNTER — Ambulatory Visit: Payer: BC Managed Care – PPO

## 2021-08-06 DIAGNOSIS — R262 Difficulty in walking, not elsewhere classified: Secondary | ICD-10-CM

## 2021-08-06 DIAGNOSIS — M6281 Muscle weakness (generalized): Secondary | ICD-10-CM | POA: Diagnosis not present

## 2021-08-11 ENCOUNTER — Ambulatory Visit: Payer: BC Managed Care – PPO

## 2021-08-11 DIAGNOSIS — M6281 Muscle weakness (generalized): Secondary | ICD-10-CM | POA: Diagnosis not present

## 2021-08-11 DIAGNOSIS — R262 Difficulty in walking, not elsewhere classified: Secondary | ICD-10-CM | POA: Diagnosis not present

## 2021-08-11 NOTE — Therapy (Signed)
?OUTPATIENT PHYSICAL THERAPY PROSTHETIC TREATMENT ? ?Patient Name: Rebecca Park ?MRN: [id number] ?DOB:12/01/1960, 61 y.o., female ?Today's Date: 08/11/2021 ? ? PT End of Session - 08/11/21 1443   ? ? Visit Number 8   ? Number of Visits 17   ? Date for PT Re-Evaluation 09/08/21   ? Authorization Type eval: 07/14/21   ? PT Start Time 4098   ? PT Stop Time 1530   ? PT Time Calculation (min) 45 min   ? Equipment Utilized During Treatment Gait belt   ? Activity Tolerance Patient tolerated treatment well   ? Behavior During Therapy [facility] for tasks assessed/performed   ? ?  ?  ? ?  ? ? ? ?Past Medical History:  ?Diagnosis Date  ? Diabetes mellitus without complication ([place])   ? Hypertension   ? PONV (postoperative nausea and vomiting)   ? the day after rt 5th toe amputation surgery  ? ?Past Surgical History:  ?Procedure Laterality Date  ? AMPUTATION Right 11/10/2018  ? Procedure: AMPUTATION RAY (amputation 5th metatarsal Right foot;  Surgeon: Rebecca Park, DPM;  Location: [facility];  Service: Podiatry;  Laterality: Right;  ? AMPUTATION Left 03/15/2021  ? Procedure: AMPUTATION BELOW KNEE;  Surgeon: Rebecca Chess, MD;  Location: [facility];  Service: Vascular;  Laterality: Left;  ? AMPUTATION TOE Right 12/05/2018  ? Procedure: RAY RIGHT 4TH AMPUTATION;  Surgeon: Rebecca Park, DPM;  Location: [place];  Service: Podiatry;  Laterality: Right;  mac with local ?DIABETIC-oral meds  ? BONE BIOPSY Right 11/10/2018  ? Procedure: BONE BIOPSY- Right 4th toe;  Surgeon: Rebecca Park, DPM;  Location: [facility];  Service: Podiatry;  Laterality: Right;  ? BUNIONECTOMY Bilateral   ? COLONOSCOPY WITH PROPOFOL N/A 09/07/2018  ? Procedure: COLONOSCOPY WITH PROPOFOL;  Surgeon: Rebecca Landsman, MD;  Location: [facility] ENDOSCOPY;  Service: Gastroenterology;  Laterality: N/A;  ? COLONOSCOPY WITH PROPOFOL N/A 09/17/2019  ? Procedure: COLONOSCOPY WITH PROPOFOL;  Surgeon: Rebecca Landsman, MD;  Location: [facility] ENDOSCOPY;  Service:  Gastroenterology;  Laterality: N/A;  ? TEE WITHOUT CARDIOVERSION N/A 03/18/2021  ? Procedure: TRANSESOPHAGEAL ECHOCARDIOGRAM (TEE);  Surgeon: Rebecca Merritts, MD;  Location: [facility];  Service: Cardiovascular;  Laterality: N/A;  ? ?Patient Active Problem List  ? Diagnosis Date Noted  ? Severe sepsis with acute organ dysfunction due to methicillin susceptible Staphylococcus aureus (MSSA) ([place]) 03/21/2021  ? Status post amputation of toe of right foot ([place]) 04/08/2020  ? Osteomyelitis ([place]) 11/08/2018  ? HLD (hyperlipidemia) 09/17/2018  ? Diabetes mellitus ([place]) 03/27/2018  ? White coat syndrome with diagnosis of hypertension 03/27/2018  ? ? ?PCP: Rebecca Elk, MD ? ?REFERRING PROVIDER: Kris Hartmann, NP ? ?REFERRING DIAGNOSIS: Z47.89 (ICD-10-CM) - Encounter for prosthetic gait training ? ?THERAPY DIAG: Difficulty in walking, not elsewhere classified ? ?Muscle weakness (generalized) ? ?ONSET DATE: 03/15/21 ? ?FOLLOW UP APPT WITH PROVIDER: Yes  ? ?FROM INITIAL EVALUATION 07/14/21 ? ?SUBJECTIVE:                                                                                                                                                                                        ? ?  Chief Complaint: L BKA ? ?Pertinent History ?Rebecca Park is a 61 year old female underwent a left below-knee amputation on 03/15/2021 secondary to a LLE ulceration and sepsis. The patient discharged to a rehab facility where she stayed for about 37 days per her report. She worked with therapy at the facility and received her LLE prosthesis about 1.5 weeks ago. She states that she has done some standing and weight shifting in the prosthesis but no true ambulation. She states good healing in the distal LLE and has had minimal pain including very infrequent phantom LLE pain/itching.   ?Pain: No, no pain currently. She has infrequent LLE phantom pain/itching.  ?Numbness/Tingling: No ?Focal Weakness: No, generalized weakness secondary   ?Recent changes in overall health/medication: No ?Prior history of physical therapy for balance:  No ?Falls: Has patient fallen in last 6 months? 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 ?Precautions: None ? ?Weight Bearing Restrictions: No ? ?Living Environment ?Lives with: lives with their spouse, mom lives next door. Spouse works during the day ?Lives in: House/apartment, one level. Has a ramp (a little steep but she could navigate with a wheelchair if necessary); ?Has following equipment at home: Single point cane, Walker - 2 wheeled, Wheelchair (manual), shower chair, bed side commode, Grab bars, and Ramped entry ? ? ?Patient Goals: August 27, 2021 her company has an anniversary celebration and pt would like to be able to be ambulating. Pt would like to be able to return to work.  ? ? ?OBJECTIVE:  ? ?Patient Surveys  ?FOTO: 34 ,predicted improvement to 53 ? ?Cognition ?Patient is oriented to person, place, and time.  ?Recent memory is intact.  ?Remote memory is intact.  ?Attention span and concentration are intact.  ?Expressive speech is intact.  ?Patient's fund of knowledge is within normal limits for educational level. ?   ?Gross Musculoskeletal Assessment ?Tremor: None ?Bulk: Normal ?Tone: Normal ?Excellent healing of residual LLE without any signs of infection. Incision is fully closed.  ? ?TRANSFER: ?Modified independent for transfers with rolling walker and upper extremity support on sitting surface ? ?GAIT: ?Distance walked: Over 200' ?Assistive device utilized: Environmental consultant - 2 wheeled ?Level of assistance: CGA ?Comments: Initially ambulation practiced in the  parallel bars with step-to pattern but pt almost immediately naturally progressed to step-through pattern. Pt able to progress to ambulation in rehab gym as well as outside to vehicle at end of session with a front wheeled walker. Again pt initially started with step-to pattern but progressed to partial step-through and then full step-through pattern. Slight decrease in LLE stance time and RLE step length. Overall pt is very steady with rolling walker.   ? ? ?Posture: ?No gross abnormalities noted in standing or seated posture ? ? ?AROM: Limited bilateral hip IR noted, L knee reaches full extension and flexion. Bilateral hip flexion WNL with some mild restriction in bilateral hip extension ? ? ?LE MMT: ? ?MMT (out of 5) Right ?08/11/2021 Left ?08/11/2021  ?Hip flexion 4+ 4+  ?Hip extension 4- 4-  ?Hip abduction 4- 4-  ?Hip adduction 4 4  ?Hip internal rotation 5 5  ?Hip external rotation 5 5  ?Knee flexion 4- 4  ?Knee extension 5 5  ?Ankle dorsiflexion 5   ?  Ankle plantarflexion Active   ?Ankle inversion    ?Ankle eversion    ?(* = pain; Blank rows = not tested) ? ? ?Sensation ?Grossly intact to light touch bilateral LEs as determined by testing dermatomes L2-S2. Proprioception, and hot/cold testing deferred on this date. ? ? ?Reflexes ?Deferred ? ? ?Cranial Nerves ?Deferred ? ? ?Coordination/Cerebellar ?Deferred ? ? ?FUNCTIONAL OUTCOME MEASURES ? ? 07/16/21 07/21/21 Comments  ?BERG  35/56 High fall risk  ?DGI     ?FGA     ?TUG 18.8s  Above cut-off  ?5TSTS Unable  Decreased LE strength  ?2 Minute Walk Test 278'  With front wheeled walker   ?10 Meter Gait Speed Self-selected: 15.3s = 0.65 m/s, fastest: 12.2s = 0.44ms  Below full community ambulation speed  ?(Blank rows = not tested) ? ?ABC: 41.3% ? ? ?TODAY'S TREATMENT  ? ?SUBJECTIVE: Pt reports that she is doing well today. No pain upon arrival. She has continued increasing the frequency of her ambulation around the house with a single point cane. One stumble reported  but no falls. No specific questions or concerns currently. ? ?PAIN: No ? ?Neuromuscular Re-education  ?NuStep L2-3 x 5 minutes BLE only (seat 9) for warm-up during interval history with therapist monitoring fatigue; ?Ambulation p

## 2021-08-11 NOTE — Therapy (Signed)
OUTPATIENT PHYSICAL THERAPY PROSTHETIC TREATMENT  Patient Name: Rebecca Park MRN: [id number] DOB:[birth year], 61 y.o., female Today's Date: 08/13/2021   PT End of Session - 08/13/21 1526     Visit Number 9    Number of Visits 17    Date for PT Re-Evaluation 09/08/21    Authorization Type eval: 07/14/21    PT Start Time 1530    PT Stop Time 1615    PT Time Calculation (min) 45 min    Equipment Utilized During Treatment Gait belt    Activity Tolerance Patient tolerated treatment well    Behavior During Therapy WFL for tasks assessed/performed               Past Medical History:  Diagnosis Date   Diabetes mellitus without complication ([place])    Hypertension    PONV (postoperative nausea and vomiting)    the day after rt 5th toe amputation surgery   Past Surgical History:  Procedure Laterality Date   AMPUTATION Right 11/10/2018   Procedure: AMPUTATION RAY (amputation 5th metatarsal Right foot;  Surgeon: Rebecca Park, DPM;  Location: [facility];  Service: Podiatry;  Laterality: Right;   AMPUTATION Left 03/15/2021   Procedure: AMPUTATION BELOW KNEE;  Surgeon: Rebecca Chess, MD;  Location: [facility];  Service: Vascular;  Laterality: Left;   AMPUTATION TOE Right 12/05/2018   Procedure: RAY RIGHT 4TH AMPUTATION;  Surgeon: Rebecca Park, DPM;  Location: [facility];  Service: Podiatry;  Laterality: Right;  mac with local DIABETIC-oral meds   BONE BIOPSY Right 11/10/2018   Procedure: BONE BIOPSY- Right 4th toe;  Surgeon: Rebecca Park, DPM;  Location: [facility];  Service: Podiatry;  Laterality: Right;   BUNIONECTOMY Bilateral    COLONOSCOPY WITH PROPOFOL N/A 09/07/2018   Procedure: COLONOSCOPY WITH PROPOFOL;  Surgeon: Rebecca Landsman, MD;  Location: [facility];  Service: Gastroenterology;  Laterality: N/A;   COLONOSCOPY WITH PROPOFOL N/A 09/17/2019   Procedure: COLONOSCOPY WITH PROPOFOL;  Surgeon: Rebecca Landsman, MD;  Location: [facility]COPY;  Service:  Gastroenterology;  Laterality: N/A;   TEE WITHOUT CARDIOVERSION N/A 03/18/2021   Procedure: TRANSESOPHAGEAL ECHOCARDIOGRAM (TEE);  Surgeon: Rebecca Merritts, MD;  Location: [facility];  Service: Cardiovascular;  Laterality: N/A;   Patient Active Problem List   Diagnosis Date Noted   Severe sepsis with acute organ dysfunction due to methicillin susceptible Staphylococcus aureus (MSSA) ([place]) 03/21/2021   Status post amputation of toe of right foot ([place]) 04/08/2020   Osteomyelitis ([place]) 11/08/2018   HLD (hyperlipidemia) 09/17/2018   Diabetes mellitus ([place]) 03/27/2018   White coat syndrome with diagnosis of hypertension 03/27/2018    PCP: Rebecca Elk, MD  REFERRING PROVIDER: Kris Hartmann, NP  REFERRING DIAGNOSIS: Z47.89 (ICD-10-CM) - Encounter for prosthetic gait training  THERAPY DIAG: Difficulty in walking, not elsewhere classified  Muscle weakness (generalized)  ONSET DATE: 03/15/21  FOLLOW UP APPT WITH PROVIDER: Yes   FROM INITIAL EVALUATION 07/14/21  SUBJECTIVE:  Chief Complaint: L BKA  Pertinent History Rebecca Park is a 61 year old female underwent a left below-knee amputation on 03/15/2021 secondary to a LLE ulceration and sepsis. The patient discharged to a rehab facility where she stayed for about 37 days per her report. She worked with therapy at the facility and received her LLE prosthesis about 1.5 weeks ago. She states that she has done some standing and weight shifting in the prosthesis but no true ambulation. She states good healing in the distal LLE and has had minimal pain including very infrequent phantom LLE pain/itching.   Pain: No, no pain currently. She has infrequent LLE phantom pain/itching.  Numbness/Tingling: No Focal Weakness: No, generalized weakness secondary   Recent changes in overall health/medication: No Prior history of physical therapy for balance:  No Falls: Has patient fallen in last 6 months? 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 Spouse works during the day Lives in: House/apartment, one level. Has a ramp (a little steep but she could navigate with a wheelchair if necessary); Has following equipment at home: Single point cane, Walker - 2 wheeled, Wheelchair (manual), shower chair, bed side commode, Grab bars, and Ramped entry   Patient Goals: August 27, 2021 her company has an anniversary celebration and pt would like to be able to be ambulating. Pt would like to be able to return to work.    OBJECTIVE:   Patient Surveys  FOTO: 73 ,predicted improvement to 13  Cognition Patient is oriented to person, place, and time.  Recent memory is intact.  Remote memory is intact.  Attention span and concentration are intact.  Expressive speech is intact.  Patient's fund of knowledge is within normal limits for educational level.    Gross Musculoskeletal Assessment Tremor: None Bulk: Normal Tone: Normal Excellent healing of residual LLE without any signs of infection. Incision is fully closed.   TRANSFER: Modified independent for transfers with rolling walker and upper extremity support on sitting surface  GAIT: Distance walked: Over 200' Assistive device utilized: Environmental consultant - 2 wheeled Level of assistance: CGA Comments: Initially ambulation practiced in the  parallel bars with step-to pattern but pt almost immediately naturally progressed to step-through pattern. Pt able to progress to ambulation in rehab gym as well as outside to vehicle at end of session with a front wheeled walker. Again pt initially started with step-to pattern but progressed to partial step-through and then full step-through pattern. Slight decrease in LLE stance time and RLE step length. Overall pt is very steady with rolling walker.     Posture: No gross abnormalities noted in standing or seated posture   AROM: Limited bilateral hip IR noted, L knee reaches full extension and flexion. Bilateral hip flexion WNL with some mild restriction in bilateral hip extension   LE MMT:  MMT (out of 5) Right 08/13/2021 Left 08/13/2021  Hip flexion 4+ 4+  Hip extension 4- 4-  Hip abduction 4- 4-  Hip adduction 4 4  Hip internal rotation 5 5  Hip external rotation 5 5  Knee flexion 4- 4  Knee extension 5 5  Ankle dorsiflexion 5  Ankle plantarflexion Active   Ankle inversion    Ankle eversion    (* = pain; Blank rows = not tested)   Sensation Grossly intact to light touch bilateral LEs as determined by testing dermatomes L2-S2. Proprioception, and hot/cold testing deferred on this date.   Reflexes Deferred   Cranial Nerves Deferred   Coordination/Cerebellar Deferred   FUNCTIONAL OUTCOME MEASURES   07/16/21 07/21/21 Comments  BERG  35/56 High fall risk  DGI     FGA     TUG 18.8s  Above cut-off  5TSTS Unable  Decreased LE strength  2 Minute Walk Test 278'  With front wheeled walker   10 Meter Gait Speed Self-selected: 15.3s = 0.65 m/s, fastest: 12.2s = 0.109ms  Below full community ambulation speed  (Blank rows = not tested)  ABC: 41.3%   TODAY'S TREATMENT   SUBJECTIVE: Pt reports that she is doing well today. No pain upon arrival and minimal phantom pains in LLE since the last therapy session. She has continued ambulating with her single point cane and  has started taking trips out in the community. No falls reported. No specific questions or concerns currently.  PAIN: No   Neuromuscular Re-education  Forward/backward ambulation without UE support in // bars x 4 lengths each; Side stepping without UE support in // bars x 4 lengths each direction; Alternating 6" step taps without UE support x 10 each; Alternating 12" step taps without UE support x 10 each; Airex alternating 6" step taps without UE support x 10 each; Airex balance with front foot on 6" step and rearfoot on Airex pad alternating forward LE x 30s each;  Practiced stairs with single UE support and single point cane in other hand (varied which UE holding the cane), step-to pattern and step-over step x 3 trials;   Ther-ex  NuStep intervals L1/L4 x 45s s each for a total of 5:30 minutes with 1 minutes warm-up and 1 minutes cool-down BLE only (seat 9) during interval history with therapist monitoring fatigue; Mini squats with mirror feedback for equal weight distribution between BLE 2 x 10;  Standing exercises with 4# ankle weights: Hip flexion marches x 15 BLE; Hamstring curls x 15 BLE; Hip abduction x 15 BLE; Hip extension x 15 BLE;  Seated LAQ with 4# AW x 15 BLE;   Not performed today: Feet together eyes open x 30s; Feet together eyes closed x 60s; Half tandem eyes open alternating forward LE x 30s each; Sit to stand without UE support from chair with Airex pad on seat 2 x 10; Tandem balance alternating forward LE x 30s each; Staggered stance balance with front foot on 6" step alternating LE positions x 30s each; Total Gym Level 22 double leg squats 3 x 10, during third set staggerred feet with LLE back and RLE for additional challenge to LLE; Seated clams with green tband 2 x 15; Seated adductor ball squeeze 2 x 15;   PATIENT EDUCATION:  Education details: Pt educated throughout session about proper posture and technique with exercises. Improved exercise  technique, movement at target joints, use of target muscles after min to mod verbal, visual, tactile cues, stair training Person educated: Patient Education method: Explanation Education comprehension: verbalized understanding and returned demonstration   HOME EXERCISE PROGRAM: Access Code: [email or web address]: [email or web address] Date: 07/21/2021 Prepared by: JRoxana Hires Exercises - Supine Active Straight Leg Raise (Mirrored)  - 1 x daily - 7 x weekly - 2-3 sets - 10 reps - 3s hold -  Sidelying Hip Abduction (Mirrored)  - 1 x daily - 7 x weekly - 2-3 sets - 10 reps - 3s hold - Seated Long Arc Quad with Ankle Weight (Mirrored)  - 1 x daily - 7 x weekly - 2-3 sets - 10 reps - 3s hold - Mini Squat with Counter Support  - 1 x daily - 7 x weekly - 2-3 sets - 10 reps - Standing March with Counter Support  - 1 x daily - 7 x weekly - 2-3 sets - 10 reps - 1-2s hold - Standing Romberg to 1/2 Tandem Stance  - 1 x daily - 7 x weekly - 3 x 30s  hold - Corner Balance Feet Together With Eyes Closed  - 1 x daily - 7 x weekly - 3 x 30s  hold   ASSESSMENT:  CLINICAL IMPRESSION: Pt demonstrates excellent motivation during session today. She arrives ambulating with a single point cane. Performed entire session in the parallel bars without assistive device an no stumbles requiring external assistance to stabilize. Challenged single leg balance as well as balance on unstable surfaces.  Repeated practice on stairs with single UE support and single point cane in other hand (varied which UE holding the cane) using step-to and step-over step techniques. Once again no overt LOB requiring assist from therapist during stair training. Performed standing mini squats today with mirror feedback to ensure equal weight between BLE and pt is able to perform well without any LLE buckling. Pt advised to continue frequent use of single point cane usage at home and out in the community. Pt encouraged to continue HEP  and follow-up as scheduled. Will update outcome measures, goals, and progress note at next therapy session. Pt will benefit from PT services to address deficits in strength, balance, and mobility in order to return to full function at home and work.    REHAB POTENTIAL: Excellent  CLINICAL DECISION MAKING: Stable/uncomplicated  EVALUATION COMPLEXITY: Low   GOALS: Goals reviewed with patient? No  SHORT TERM GOALS: Target date: 09/10/2021  Pt will be independent with HEP in order to improve strength and balance in order to decrease fall risk and improve function at home. Baseline:  Goal status: INITIAL   LONG TERM GOALS: Target date: 10/08/2021  Pt will increase FOTO to at least 53 to demonstrate significant improvement in function at home related to balance  Baseline: 07/14/21: 34 Goal status: INITIAL  2.  Pt will improve BERG by at least 3 points in order to demonstrate clinically significant improvement in balance.   Baseline: 07/21/21: 35/56 Goal status: INITIAL  3.  Pt will improve ABC by at least 13% in order to demonstrate clinically significant improvement in balance confidence.    Baseline: 07/16/21: 41.3% Goal status: INITIAL  4. Pt will decrease 5TSTS by at least 3 seconds in order to demonstrate clinically significant improvement in LE strength      Baseline: 07/16/21: Pt unable to perform sit to stand without UE support Goal status: INITIAL  5. Pt will be able to ambulate for at least 2 minutes at a time with a single point cane without loss of balance in order to participate in her company celebration safely ambulating with a single point cane. Baseline: 07/14/21: limited ambulation with front wheeled walker, 07/16/21: 278' with front wheeled walker   Goal status: INITIAL   PLAN: PT FREQUENCY: 2x/week  PT DURATION: 8 weeks  PLANNED INTERVENTIONS: Therapeutic exercises, Therapeutic activity, Neuromuscular re-education, Balance training, Gait training, Patient/Family  education,  Joint manipulation, Joint mobilization, Canalith repositioning, Aquatic Therapy, Dry Needling, Cognitive remediation, Electrical stimulation, Spinal manipulation, Spinal mobilization, Cryotherapy, Moist heat, Traction, Ultrasound, Ionotophoresis 78m/ml Dexamethasone, and Manual therapy  PLAN FOR NEXT SESSION: update outcome measures, goals, progress note and continue strengthening/balance exercises, review/modify HEP as appropriate  JLyndel SafeHuprich PT, DPT, GCS  Codee Bloodworth 08/13/2021, 5:32 PM

## 2021-08-12 ENCOUNTER — Other Ambulatory Visit: Payer: Self-pay | Admitting: Physician Assistant

## 2021-08-12 DIAGNOSIS — E119 Type 2 diabetes mellitus without complications: Secondary | ICD-10-CM | POA: Diagnosis not present

## 2021-08-12 DIAGNOSIS — I10 Essential (primary) hypertension: Secondary | ICD-10-CM | POA: Diagnosis not present

## 2021-08-12 DIAGNOSIS — E782 Mixed hyperlipidemia: Secondary | ICD-10-CM | POA: Diagnosis not present

## 2021-08-12 DIAGNOSIS — Z0000 Encounter for general adult medical examination without abnormal findings: Secondary | ICD-10-CM | POA: Diagnosis not present

## 2021-08-12 DIAGNOSIS — Z1231 Encounter for screening mammogram for malignant neoplasm of breast: Secondary | ICD-10-CM

## 2021-08-13 ENCOUNTER — Ambulatory Visit: Payer: BC Managed Care – PPO

## 2021-08-13 DIAGNOSIS — M6281 Muscle weakness (generalized): Secondary | ICD-10-CM

## 2021-08-13 DIAGNOSIS — R262 Difficulty in walking, not elsewhere classified: Secondary | ICD-10-CM | POA: Diagnosis not present

## 2021-08-18 ENCOUNTER — Ambulatory Visit: Payer: BC Managed Care – PPO

## 2021-08-18 DIAGNOSIS — M6281 Muscle weakness (generalized): Secondary | ICD-10-CM

## 2021-08-18 DIAGNOSIS — R262 Difficulty in walking, not elsewhere classified: Secondary | ICD-10-CM

## 2021-08-18 NOTE — Therapy (Signed)
OUTPATIENT PHYSICAL THERAPY PROSTHETIC TREATMENT/PROGRESS NOTE  Dates of reporting period  07/14/21   to   08/18/21   Patient Name: Rebecca Park MRN: [id number] DOB:05-21-1960, 61 y.o., female Today's Date: 08/18/2021   PT End of Session - 08/18/21 1440     Visit Number 10    Number of Visits 17    Date for PT Re-Evaluation 09/08/21    Authorization Type eval: 07/14/21    PT Start Time 1445    PT Stop Time 1530    PT Time Calculation (min) 45 min    Equipment Utilized During Treatment Gait belt    Activity Tolerance Patient tolerated treatment well    Behavior During Therapy WFL for tasks assessed/performed             Past Medical History:  Diagnosis Date   Diabetes mellitus without complication ([place])    Hypertension    PONV (postoperative nausea and vomiting)    the day after rt 5th toe amputation surgery   Past Surgical History:  Procedure Laterality Date   AMPUTATION Right 11/10/2018   Procedure: AMPUTATION RAY (amputation 5th metatarsal Right foot;  Surgeon: Caroline More, DPM;  Location: [facility];  Service: Podiatry;  Laterality: Right;   AMPUTATION Left 03/15/2021   Procedure: AMPUTATION BELOW KNEE;  Surgeon: Zara Chess, MD;  Location: [facility];  Service: Vascular;  Laterality: Left;   AMPUTATION TOE Right 12/05/2018   Procedure: RAY RIGHT 4TH AMPUTATION;  Surgeon: Caroline More, DPM;  Location: [place];  Service: Podiatry;  Laterality: Right;  mac with local DIABETIC-oral meds   BONE BIOPSY Right 11/10/2018   Procedure: BONE BIOPSY- Right 4th toe;  Surgeon: Caroline More, DPM;  Location: [facility];  Service: Podiatry;  Laterality: Right;   BUNIONECTOMY Bilateral    COLONOSCOPY WITH PROPOFOL N/A 09/07/2018   Procedure: COLONOSCOPY WITH PROPOFOL;  Surgeon: Lin Landsman, MD;  Location: [facility];  Service: Gastroenterology;  Laterality: N/A;   COLONOSCOPY WITH PROPOFOL N/A 09/17/2019   Procedure: COLONOSCOPY WITH PROPOFOL;  Surgeon: Lin Landsman, MD;  Location: [facility] ENDOSCOPY;  Service: Gastroenterology;  Laterality: N/A;   TEE WITHOUT CARDIOVERSION N/A 03/18/2021   Procedure: TRANSESOPHAGEAL ECHOCARDIOGRAM (TEE);  Surgeon: Minna Merritts, MD;  Location: [facility];  Service: Cardiovascular;  Laterality: N/A;   Patient Active Problem List   Diagnosis Date Noted   Severe sepsis with acute organ dysfunction due to methicillin susceptible Staphylococcus aureus (MSSA) ([place]) 03/21/2021   Status post amputation of toe of right foot ([place]) 04/08/2020   Osteomyelitis ([place]) 11/08/2018   HLD (hyperlipidemia) 09/17/2018   Diabetes mellitus ([place]) 03/27/2018   White coat syndrome with diagnosis of hypertension 03/27/2018    PCP: Marinda Elk, MD  REFERRING PROVIDER: Marinda Elk, MD  REFERRING DIAGNOSIS: Z47.89 (ICD-10-CM) - Encounter for prosthetic gait training  THERAPY DIAG: Difficulty in walking, not elsewhere classified  Muscle weakness (generalized)  ONSET DATE: 03/15/21  FOLLOW UP APPT WITH PROVIDER: Yes   FROM INITIAL EVALUATION 07/14/21  SUBJECTIVE:  Chief Complaint: L BKA  Pertinent History Rebecca Park is a 61 year old female underwent a left below-knee amputation on 03/15/2021 secondary to a LLE ulceration and sepsis. The patient discharged to a rehab facility where she stayed for about 37 days per her report. She worked with therapy at the facility and received her LLE prosthesis about 1.5 weeks ago. She states that she has done some standing and weight shifting in the prosthesis but no true ambulation. She states good healing in the distal LLE and has had minimal pain including very infrequent phantom LLE pain/itching.   Pain: No, no pain currently. She has infrequent LLE phantom pain/itching.  Numbness/Tingling:  No Focal Weakness: No, generalized weakness secondary  Recent changes in overall health/medication: No Prior history of physical therapy for balance:  No Falls: Has patient fallen in last 6 months? 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 Spouse works during the day Lives in: House/apartment, one level. Has a ramp (a little steep but she could navigate with a wheelchair if necessary); Has following equipment at home: Single point cane, Walker - 2 wheeled, Wheelchair (manual), shower chair, bed side commode, Grab bars, and Ramped entry   Patient Goals: August 27, 2021 her company has an anniversary celebration and pt would like to be able to be ambulating. Pt would like to be able to return to work.    OBJECTIVE:   Patient Surveys  FOTO: 14 ,predicted improvement to 10  Cognition Patient is oriented to person, place, and time.  Recent memory is intact.  Remote memory is intact.  Attention span and concentration are intact.  Expressive speech is intact.  Patient's fund of knowledge is within normal limits for educational level.    Gross Musculoskeletal Assessment Tremor: None Bulk: Normal Tone: Normal Excellent healing of residual LLE without any signs of infection. Incision is fully closed.   TRANSFER: Modified independent for transfers with rolling walker and upper extremity support on sitting surface  GAIT: Distance walked: Over 200' Assistive device utilized: Environmental consultant - 2 wheeled Level of assistance:  CGA Comments: Initially ambulation practiced in the parallel bars with step-to pattern but pt almost immediately naturally progressed to step-through pattern. Pt able to progress to ambulation in rehab gym as well as outside to vehicle at end of session with a front wheeled walker. Again pt initially started with step-to pattern but progressed to partial step-through and then full step-through pattern. Slight decrease in LLE stance time and RLE step length. Overall pt is very steady with rolling walker.     Posture: No gross abnormalities noted in standing or seated posture   AROM: Limited bilateral hip IR noted, L knee reaches full extension and flexion. Bilateral hip flexion WNL with some mild restriction in bilateral hip extension   LE MMT:  MMT (out of 5) Right 08/18/2021 Left 08/18/2021  Hip flexion 4+ 4+  Hip extension 4- 4-  Hip abduction 4- 4-  Hip adduction 4 4  Hip internal rotation 5 5  Hip external rotation 5 5  Knee flexion 4- 4  Knee extension 5 5  Ankle dorsiflexion 5  Ankle plantarflexion Active   Ankle inversion    Ankle eversion    (* = pain; Blank rows = not tested)   Sensation Grossly intact to light touch bilateral LEs as determined by testing dermatomes L2-S2. Proprioception, and hot/cold testing deferred on this date.   Reflexes Deferred   Cranial Nerves Deferred   Coordination/Cerebellar Deferred   FUNCTIONAL OUTCOME MEASURES   07/16/21 07/21/21 Comments  BERG  35/56 High fall risk  DGI     FGA     TUG 18.8s  Above cut-off  5TSTS Unable  Decreased LE strength  2 Minute Walk Test 278'  With front wheeled walker   10 Meter Gait Speed Self-selected: 15.3s = 0.65 m/s, fastest: 12.2s = 0.57ms  Below full community ambulation speed  (Blank rows = not tested)  ABC: 41.3%   TODAY'S TREATMENT   SUBJECTIVE: Pt reports that she is doing well today. No pain upon arrival and minimal phantom pains in LLE since the last therapy session. She has  continued ambulating with her single point cane and had the ramp removed at the house. She has been able to enter/exit home via steps without issues. No falls reported. No specific questions or concerns currently.  PAIN: No   Neuromuscular Re-education  Updated outcome measures with patient: FOTO: 55 BERG: 45/56 5TSTS: 11.6s, minA+1 for balance; TUG: 11.0s 123mait speed: Self-selected: 13.3s = 0.75 m/s, fastest: 10.3s = 0.9751m 2MWT:  Pre-vitals: BP: 124/57, HR: 101, SpO2: 98% Distance walked: 29248ith single point cane and supervision from therapist  Mini squats with mirror feedback for equal weight distribution between BLE x 10; Forward ambulation without UE support in // bars x 4 lengths; Side stepping without UE support in // bars x 4 lengths; Cross-over side stepping in // bars without UE support x 2 lengths;   Not performed today: Feet together eyes open x 30s; Feet together eyes closed x 60s; Half tandem eyes open alternating forward LE x 30s each; Sit to stand without UE support from chair with Airex pad on seat 2 x 10; Tandem balance alternating forward LE x 30s each; Staggered stance balance with front foot on 6" step alternating LE positions x 30s each; Total Gym Level 22 double leg squats 3 x 10, during third set staggerred feet with LLE back and RLE for additional challenge to LLE; Seated clams with green tband 2 x 15; Seated adductor ball squeeze 2 x 15; Alternating 6" step taps without UE support x 10 each; Alternating 12" step taps without UE support x 10 each; Airex alternating 6" step taps without UE support x 10 each; Airex balance with front foot on 6" step and rearfoot on Airex pad alternating forward LE x 30s each; Practiced stairs with single UE support and single point cane in other hand (varied which UE holding the cane), step-to pattern and step-over step x 3 trials; NuStep intervals L1/L4 x 45s s each for a total of 5:30 minutes with 1 minutes  warm-up and 1 minutes cool-down BLE only (seat 9) during interval history with therapist monitoring fatigue; Standing exercises with 4# ankle weights: Hip flexion marches x 15 BLE; Hamstring curls x 15 BLE; Hip abduction x 15 BLE; Hip extension x 15 BLE; Seated LAQ with 4# AW x 15 BLE;   PATIENT EDUCATION:  Education details: Pt educated throughout session about proper posture and technique with exercises. Improved exercise technique, movement at target joints, use of target muscles after min to mod verbal, visual,  tactile cues, stair training Person educated: Patient Education method: Explanation Education comprehension: verbalized understanding and returned demonstration   HOME EXERCISE PROGRAM: Access Code: [email or web address] URL: [email or web address] Date: 07/21/2021 Prepared by: Roxana Hires  Exercises - Supine Active Straight Leg Raise (Mirrored)  - 1 x daily - 7 x weekly - 2-3 sets - 10 reps - 3s hold - Sidelying Hip Abduction (Mirrored)  - 1 x daily - 7 x weekly - 2-3 sets - 10 reps - 3s hold - Seated Long Arc Quad with Ankle Weight (Mirrored)  - 1 x daily - 7 x weekly - 2-3 sets - 10 reps - 3s hold - Mini Squat with Counter Support  - 1 x daily - 7 x weekly - 2-3 sets - 10 reps - Standing March with Counter Support  - 1 x daily - 7 x weekly - 2-3 sets - 10 reps - 1-2s hold - Standing Romberg to 1/2 Tandem Stance  - 1 x daily - 7 x weekly - 3 x 30s  hold - Corner Balance Feet Together With Eyes Closed  - 1 x daily - 7 x weekly - 3 x 30s  hold   ASSESSMENT:  CLINICAL IMPRESSION: Pt demonstrates excellent motivation during session today. Updated outcome measures and goals with patient during visit today. She is now able to complete a Five Time Sit to Stand Test in 11.6s and was unable to perform a single sit to stand without UE support at the initial evaluation. Her FOTO score increased from 34 to 55 and her ABC Scale Score increased from 41.3% at the initial evaluation  to 72.5% today. Her BERG increased from 35/56 initially to 45/56 today. Her TUG and 15mgait speed both improved considerably and initially her ambulation was very limited with a front wheeled walker. She is now ambulating exclusively with a single point cane and today she was able to ambulate 292 feet during the Two Minute Walk Test. Additional time during session today spend on challenging balance in the parallel bars without UE support. Once again no overt LOB requiring assist from therapist during session. Pt encouraged to continue HEP and follow-up as scheduled. Pt will benefit from PT services to address deficits in strength, balance, and mobility in order to return to full function at home and work.    REHAB POTENTIAL: Excellent  CLINICAL DECISION MAKING: Stable/uncomplicated  EVALUATION COMPLEXITY: Low   GOALS: Goals reviewed with patient? No  SHORT TERM GOALS: Target date: 08/11/2021  Pt will be independent with HEP in order to improve strength and balance in order to decrease fall risk and improve function at home. Baseline:  Goal status: ACHIEVED   LONG TERM GOALS: Target date: 09/08/2021  Pt will increase FOTO to at least 53 to demonstrate significant improvement in function at home related to balance  Baseline: 07/14/21: 34; 08/18/21: 55 Goal status: ACHIEVED  2.  Pt will improve BERG to at least 50/56 points in order to demonstrate clinically significant improvement in balance.   Baseline: 07/21/21: 35/56; 08/18/21: 45/56 Goal status: REVISED  3.  Pt will improve ABC by at least 13% in order to demonstrate clinically significant improvement in balance confidence.    Baseline: 07/16/21: 41.3%, 08/18/21: 72.5% Goal status: ACHIEVED  4. Pt will decrease 5TSTS by at least 3 seconds in order to demonstrate clinically significant improvement in LE strength      Baseline: 07/16/21: Pt unable to perform sit to stand without UE support; 08/18/21: 11.6s, minA+1 for balance; Goal status:  ACHIEVED  5. Pt will increased 2MWT to greater than 390' with a single point cane without loss of balance in order to participate in her company celebration safely ambulating with a single point cane. Baseline: 07/14/21: limited ambulation with front wheeled walker, 07/16/21: 278' with front wheeled walker; 08/18/21: 292' with single point cane and supervision from therapist Goal status: REVISED   PLAN: PT FREQUENCY: 2x/week  PT DURATION: 8 weeks  PLANNED INTERVENTIONS: Therapeutic exercises, Therapeutic activity, Neuromuscular re-education, Balance training, Gait training, Patient/Family education, Joint manipulation, Joint mobilization, Canalith repositioning, Aquatic Therapy, Dry Needling, Cognitive remediation, Electrical stimulation, Spinal manipulation, Spinal mobilization, Cryotherapy, Moist heat, Traction, Ultrasound, Ionotophoresis 8m/ml Dexamethasone, and Manual therapy  PLAN FOR NEXT SESSION: continue strengthening/balance exercises, review/modify HEP as appropriate  JLyndel SafeHuprich PT, DPT, GCS  Abanoub Hanken 08/18/2021, 5:52 PM

## 2021-08-20 ENCOUNTER — Ambulatory Visit: Payer: BC Managed Care – PPO

## 2021-08-20 DIAGNOSIS — M6281 Muscle weakness (generalized): Secondary | ICD-10-CM | POA: Diagnosis not present

## 2021-08-20 DIAGNOSIS — R262 Difficulty in walking, not elsewhere classified: Secondary | ICD-10-CM | POA: Diagnosis not present

## 2021-08-20 NOTE — Therapy (Signed)
OUTPATIENT PHYSICAL THERAPY PROSTHETIC TREATMENT   Patient Name: Rebecca Park MRN: [id number] DOB:1960/10/02, 61 y.o., female Today's Date: 08/20/2021   PT End of Session - 08/20/21 1521     Visit Number 11    Number of Visits 17    Date for PT Re-Evaluation 09/08/21    Authorization Type eval: 07/14/21    PT Start Time 1530    PT Stop Time 1615    PT Time Calculation (min) 45 min    Equipment Utilized During Treatment Gait belt    Activity Tolerance Patient tolerated treatment well    Behavior During Therapy WFL for tasks assessed/performed             Past Medical History:  Diagnosis Date   Diabetes mellitus without complication ([place])    Hypertension    PONV (postoperative nausea and vomiting)    the day after rt 5th toe amputation surgery   Past Surgical History:  Procedure Laterality Date   AMPUTATION Right 11/10/2018   Procedure: AMPUTATION RAY (amputation 5th metatarsal Right foot;  Surgeon: Caroline More, DPM;  Location: [facility];  Service: Podiatry;  Laterality: Right;   AMPUTATION Left 03/15/2021   Procedure: AMPUTATION BELOW KNEE;  Surgeon: Zara Chess, MD;  Location: [facility];  Service: Vascular;  Laterality: Left;   AMPUTATION TOE Right 12/05/2018   Procedure: RAY RIGHT 4TH AMPUTATION;  Surgeon: Caroline More, DPM;  Location: [place];  Service: Podiatry;  Laterality: Right;  mac with local DIABETIC-oral meds   BONE BIOPSY Right 11/10/2018   Procedure: BONE BIOPSY- Right 4th toe;  Surgeon: Caroline More, DPM;  Location: [facility];  Service: Podiatry;  Laterality: Right;   BUNIONECTOMY Bilateral    COLONOSCOPY WITH PROPOFOL N/A 09/07/2018   Procedure: COLONOSCOPY WITH PROPOFOL;  Surgeon: Lin Landsman, MD;  Location: [facility];  Service: Gastroenterology;  Laterality: N/A;   COLONOSCOPY WITH PROPOFOL N/A 09/17/2019   Procedure: COLONOSCOPY WITH PROPOFOL;  Surgeon: Lin Landsman, MD;  Location: [facility] ENDOSCOPY;  Service:  Gastroenterology;  Laterality: N/A;   TEE WITHOUT CARDIOVERSION N/A 03/18/2021   Procedure: TRANSESOPHAGEAL ECHOCARDIOGRAM (TEE);  Surgeon: Minna Merritts, MD;  Location: [facility];  Service: Cardiovascular;  Laterality: N/A;   Patient Active Problem List   Diagnosis Date Noted   Severe sepsis with acute organ dysfunction due to methicillin susceptible Staphylococcus aureus (MSSA) ([place]) 03/21/2021   Status post amputation of toe of right foot ([place]) 04/08/2020   Osteomyelitis ([place]) 11/08/2018   HLD (hyperlipidemia) 09/17/2018   Diabetes mellitus ([place]) 03/27/2018   White coat syndrome with diagnosis of hypertension 03/27/2018    PCP: Marinda Elk, MD  REFERRING PROVIDER: Marinda Elk, MD  REFERRING DIAGNOSIS: Z47.89 (ICD-10-CM) - Encounter for prosthetic gait training  THERAPY DIAG: Difficulty in walking, not elsewhere classified  Muscle weakness (generalized)  ONSET DATE: 03/15/21  FOLLOW UP APPT WITH PROVIDER: Yes   FROM INITIAL EVALUATION 07/14/21  SUBJECTIVE:  Chief Complaint: L BKA  Pertinent History Rebecca Park is a 61 year old female underwent a left below-knee amputation on 03/15/2021 secondary to a LLE ulceration and sepsis. The patient discharged to a rehab facility where she stayed for about 37 days per her report. She worked with therapy at the facility and received her LLE prosthesis about 1.5 weeks ago. She states that she has done some standing and weight shifting in the prosthesis but no true ambulation. She states good healing in the distal LLE and has had minimal pain including very infrequent phantom LLE pain/itching.   Pain: No, no pain currently. She has infrequent LLE phantom pain/itching.  Numbness/Tingling: No Focal Weakness: No, generalized weakness secondary   Recent changes in overall health/medication: No Prior history of physical therapy for balance:  No Falls: Has patient fallen in last 6 months? 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 Spouse works during the day Lives in: House/apartment, one level. Has a ramp (a little steep but she could navigate with a wheelchair if necessary); Has following equipment at home: Single point cane, Walker - 2 wheeled, Wheelchair (manual), shower chair, bed side commode, Grab bars, and Ramped entry   Patient Goals: August 27, 2021 her company has an anniversary celebration and pt would like to be able to be ambulating. Pt would like to be able to return to work.    OBJECTIVE:   Patient Surveys  FOTO: 63 ,predicted improvement to 74  Cognition Patient is oriented to person, place, and time.  Recent memory is intact.  Remote memory is intact.  Attention span and concentration are intact.  Expressive speech is intact.  Patient's fund of knowledge is within normal limits for educational level.    Gross Musculoskeletal Assessment Tremor: None Bulk: Normal Tone: Normal Excellent healing of residual LLE without any signs of infection. Incision is fully closed.   TRANSFER: Modified independent for transfers with rolling walker and upper extremity support on sitting surface  GAIT: Distance walked: Over 200' Assistive device utilized: Environmental consultant - 2 wheeled Level of assistance: CGA Comments: Initially ambulation practiced in the  parallel bars with step-to pattern but pt almost immediately naturally progressed to step-through pattern. Pt able to progress to ambulation in rehab gym as well as outside to vehicle at end of session with a front wheeled walker. Again pt initially started with step-to pattern but progressed to partial step-through and then full step-through pattern. Slight decrease in LLE stance time and RLE step length. Overall pt is very steady with rolling walker.     Posture: No gross abnormalities noted in standing or seated posture   AROM: Limited bilateral hip IR noted, L knee reaches full extension and flexion. Bilateral hip flexion WNL with some mild restriction in bilateral hip extension   LE MMT:  MMT (out of 5) Right 08/20/2021 Left 08/20/2021  Hip flexion 4+ 4+  Hip extension 4- 4-  Hip abduction 4- 4-  Hip adduction 4 4  Hip internal rotation 5 5  Hip external rotation 5 5  Knee flexion 4- 4  Knee extension 5 5  Ankle dorsiflexion 5  Ankle plantarflexion Active   Ankle inversion    Ankle eversion    (* = pain; Blank rows = not tested)   Sensation Grossly intact to light touch bilateral LEs as determined by testing dermatomes L2-S2. Proprioception, and hot/cold testing deferred on this date.   Reflexes Deferred   Cranial Nerves Deferred   Coordination/Cerebellar Deferred   FUNCTIONAL OUTCOME MEASURES   07/16/21 07/21/21 Comments  BERG  35/56 High fall risk  DGI     FGA     TUG 18.8s  Above cut-off  5TSTS Unable  Decreased LE strength  2 Minute Walk Test 278'  With front wheeled walker   10 Meter Gait Speed Self-selected: 15.3s = 0.65 m/s, fastest: 12.2s = 0.35ms  Below full community ambulation speed  (Blank rows = not tested)  ABC: 41.3%   TODAY'S TREATMENT   SUBJECTIVE: Pt reports that she is doing well today. No pain upon arrival but she has noticed some mild L knee pain recently. She has continued ambulating with her single point cane and denies having  any issues. No falls. She feels confident going to her company party next week ambulating with a single point cane. No specific questions or concerns currently.  PAIN: No   Ther-ex  NuStep intervals L1/L4 x 45s s each for a total of 8:30 minutes with 1 minute warm-up and 1 minute cool-down BLE only (seat 9) during interval history with therapist monitoring fatigue and adjusting resistance for intervals; Total Gym (TG) Level 22 (L22) double leg squats x 10; TG L15 L single leg squats 2 x 5;   Neuromuscular Re-education  Practiced stairs with no UE support during ascent and single UE support on rail for descent step-to pattern x 3 trials, x 2 trials; 6" and 12" hurdle step walking in // bars without UE support 2 x 4 lengths; Airex balance beam tandem balance alternating forward LE x 30s each; Airex balance beam tandem gait x 4 lengths; Airex balance beam side stepping x 4 lengths;   Not performed today: Feet together eyes open x 30s; Feet together eyes closed x 60s; Half tandem eyes open alternating forward LE x 30s each; Sit to stand without UE support from chair with Airex pad on seat 2 x 10; Tandem balance alternating forward LE x 30s each; Staggered stance balance with front foot on 6" step alternating LE positions x 30s each; Seated clams with green tband 2 x 15; Seated adductor ball squeeze 2 x 15; Alternating 6" step taps without UE support x 10 each; Alternating 12" step taps without UE support x 10 each; Airex alternating 6" step taps without UE support x 10 each; Airex balance with front foot on 6" step and rearfoot on Airex pad alternating forward LE x 30s each; Standing exercises with 4# ankle weights: Hip flexion marches x 15 BLE; Hamstring curls x 15 BLE; Hip abduction x 15 BLE; Hip extension x 15 BLE; Mini squats with mirror feedback for equal weight distribution between BLE x 10; Seated LAQ with 4# AW x 15 BLE; Forward ambulation without UE support in // bars x 4  lengths; Side stepping without UE support in // bars x 4 lengths; Cross-over side stepping in // bars without UE support x 2 lengths;   PATIENT EDUCATION:  Education details: Pt educated throughout session about proper posture and technique with exercises. Improved exercise technique, movement at target joints, use of target muscles after min to mod verbal, visual, tactile cues, stair training Person educated:  Patient Education method: Explanation Education comprehension: verbalized understanding and returned demonstration   HOME EXERCISE PROGRAM: Access Code: [email or web address] URL: [email or web address] Date: 07/21/2021 Prepared by: Roxana Hires  Exercises - Supine Active Straight Leg Raise (Mirrored)  - 1 x daily - 7 x weekly - 2-3 sets - 10 reps - 3s hold - Sidelying Hip Abduction (Mirrored)  - 1 x daily - 7 x weekly - 2-3 sets - 10 reps - 3s hold - Seated Long Arc Quad with Ankle Weight (Mirrored)  - 1 x daily - 7 x weekly - 2-3 sets - 10 reps - 3s hold - Mini Squat with Counter Support  - 1 x daily - 7 x weekly - 2-3 sets - 10 reps - Standing March with Counter Support  - 1 x daily - 7 x weekly - 2-3 sets - 10 reps - 1-2s hold - Standing Romberg to 1/2 Tandem Stance  - 1 x daily - 7 x weekly - 3 x 30s  hold - Corner Balance Feet Together With Eyes Closed  - 1 x daily - 7 x weekly - 3 x 30s  hold   ASSESSMENT:  CLINICAL IMPRESSION: Pt demonstrates excellent motivation during session today. Continued to challenge strength, balance, and endurance throughout session. Introduced left single leg squats on the Total Gym as well as balance practice on the Airex balance beam. Pt continues to report fatigue however endurance is improving. Pt encouraged to continue HEP and follow-up as scheduled. Will consider advancing HEP at next therapy session. Pt will benefit from PT services to address deficits in strength, balance, and mobility in order to return to full function at home and  work.    REHAB POTENTIAL: Excellent  CLINICAL DECISION MAKING: Stable/uncomplicated  EVALUATION COMPLEXITY: Low   GOALS: Goals reviewed with patient? No  SHORT TERM GOALS: Target date: 08/11/2021  Pt will be independent with HEP in order to improve strength and balance in order to decrease fall risk and improve function at home. Baseline:  Goal status: ACHIEVED   LONG TERM GOALS: Target date: 09/08/2021  Pt will increase FOTO to at least 53 to demonstrate significant improvement in function at home related to balance  Baseline: 07/14/21: 34; 08/18/21: 55 Goal status: ACHIEVED  2.  Pt will improve BERG to at least 50/56 points in order to demonstrate clinically significant improvement in balance.   Baseline: 07/21/21: 35/56; 08/18/21: 45/56 Goal status: REVISED  3.  Pt will improve ABC by at least 13% in order to demonstrate clinically significant improvement in balance confidence.    Baseline: 07/16/21: 41.3%, 08/18/21: 72.5% Goal status: ACHIEVED  4. Pt will decrease 5TSTS by at least 3 seconds in order to demonstrate clinically significant improvement in LE strength      Baseline: 07/16/21: Pt unable to perform sit to stand without UE support; 08/18/21: 11.6s, minA+1 for balance; Goal status: ACHIEVED  5. Pt will increased 2MWT to greater than 390' with a single point cane without loss of balance in order to participate in her company celebration safely ambulating with a single point cane. Baseline: 07/14/21: limited ambulation with front wheeled walker, 07/16/21: 278' with front wheeled walker; 08/18/21: 292' with single point cane and supervision from therapist Goal status: REVISED   PLAN: PT FREQUENCY: 2x/week  PT DURATION: 8 weeks  PLANNED INTERVENTIONS: Therapeutic exercises, Therapeutic activity, Neuromuscular re-education, Balance training, Gait training, Patient/Family education, Joint manipulation, Joint mobilization, Canalith repositioning, Aquatic Therapy, Dry  Needling, Cognitive remediation, Electrical stimulation, Spinal manipulation, Spinal mobilization, Cryotherapy,  Moist heat, Traction, Ultrasound, Ionotophoresis 98m/ml Dexamethasone, and Manual therapy  PLAN FOR NEXT SESSION: continue strengthening/balance exercises, review/modify HEP as appropriate  JLyndel SafeHuprich PT, DPT, GCS  Rebecca Park 08/20/2021, 4:53 PM

## 2021-08-25 ENCOUNTER — Ambulatory Visit: Payer: BC Managed Care – PPO

## 2021-08-25 DIAGNOSIS — R262 Difficulty in walking, not elsewhere classified: Secondary | ICD-10-CM

## 2021-08-25 DIAGNOSIS — M6281 Muscle weakness (generalized): Secondary | ICD-10-CM | POA: Diagnosis not present

## 2021-08-25 NOTE — Therapy (Signed)
OUTPATIENT PHYSICAL THERAPY PROSTHETIC TREATMENT   Patient Name: Rebecca Park MRN: [id number] DOB:[birth year], 61 y.o., female Today's Date: 08/25/2021   PT End of Session - 08/25/21 1447     Visit Number 12    Number of Visits 17    Date for PT Re-Evaluation 09/08/21    Authorization Type eval: 07/14/21    PT Start Time 1445    PT Stop Time 1530    PT Time Calculation (min) 45 min    Equipment Utilized During Treatment Gait belt    Activity Tolerance Patient tolerated treatment well    Behavior During Therapy WFL for tasks assessed/performed              Past Medical History:  Diagnosis Date   Diabetes mellitus without complication ([place])    Hypertension    PONV (postoperative nausea and vomiting)    the day after rt 5th toe amputation surgery   Past Surgical History:  Procedure Laterality Date   AMPUTATION Right 11/10/2018   Procedure: AMPUTATION RAY (amputation 5th metatarsal Right foot;  Surgeon: Caroline More, DPM;  Location: [facility];  Service: Podiatry;  Laterality: Right;   AMPUTATION Left 03/15/2021   Procedure: AMPUTATION BELOW KNEE;  Surgeon: Zara Chess, MD;  Location: [facility];  Service: Vascular;  Laterality: Left;   AMPUTATION TOE Right 12/05/2018   Procedure: RAY RIGHT 4TH AMPUTATION;  Surgeon: Caroline More, DPM;  Location: [place] [facility];  Service: Podiatry;  Laterality: Right;  mac with local DIABETIC-oral meds   BONE BIOPSY Right 11/10/2018   Procedure: BONE BIOPSY- Right 4th toe;  Surgeon: Caroline More, DPM;  Location: [facility];  Service: Podiatry;  Laterality: Right;   BUNIONECTOMY Bilateral    COLONOSCOPY WITH PROPOFOL N/A 09/07/2018   Procedure: COLONOSCOPY WITH PROPOFOL;  Surgeon: Lin Landsman, MD;  Location: [facility];  Service: Gastroenterology;  Laterality: N/A;   COLONOSCOPY WITH PROPOFOL N/A 09/17/2019   Procedure: COLONOSCOPY WITH PROPOFOL;  Surgeon: Lin Landsman, MD;  Location: [facility] ENDOSCOPY;  Service:  Gastroenterology;  Laterality: N/A;   TEE WITHOUT CARDIOVERSION N/A 03/18/2021   Procedure: TRANSESOPHAGEAL ECHOCARDIOGRAM (TEE);  Surgeon: Minna Merritts, MD;  Location: [facility];  Service: Cardiovascular;  Laterality: N/A;   Patient Active Problem List   Diagnosis Date Noted   Severe sepsis with acute organ dysfunction due to methicillin susceptible Staphylococcus aureus (MSSA) ([place]) 03/21/2021   Status post amputation of toe of right foot ([place]) 04/08/2020   Osteomyelitis ([place]) 11/08/2018   HLD (hyperlipidemia) 09/17/2018   Diabetes mellitus ([place]) 03/27/2018   White coat syndrome with diagnosis of hypertension 03/27/2018    PCP: Marinda Elk, MD  REFERRING PROVIDER: Marinda Elk, MD  REFERRING DIAGNOSIS: Z47.89 (ICD-10-CM) - Encounter for prosthetic gait training  THERAPY DIAG: Difficulty in walking, not elsewhere classified  Muscle weakness (generalized)  ONSET DATE: 03/15/21  FOLLOW UP APPT WITH PROVIDER: Yes   FROM INITIAL EVALUATION 07/14/21  SUBJECTIVE:  Chief Complaint: L BKA  Pertinent History Rebecca Park is a 61 year old female underwent a left below-knee amputation on 03/15/2021 secondary to a LLE ulceration and sepsis. The patient discharged to a rehab facility where she stayed for about 37 days per her report. She worked with therapy at the facility and received her LLE prosthesis about 1.5 weeks ago. She states that she has done some standing and weight shifting in the prosthesis but no true ambulation. She states good healing in the distal LLE and has had minimal pain including very infrequent phantom LLE pain/itching.   Pain: No, no pain currently. She has infrequent LLE phantom pain/itching.  Numbness/Tingling: No Focal Weakness: No, generalized weakness secondary   Recent changes in overall health/medication: No Prior history of physical therapy for balance:  No Falls: Has patient fallen in last 6 months? 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 Spouse works during the day Lives in: House/apartment, one level. Has a ramp (a little steep but she could navigate with a wheelchair if necessary); Has following equipment at home: Single point cane, Walker - 2 wheeled, Wheelchair (manual), shower chair, bed side commode, Grab bars, and Ramped entry   Patient Goals: August 27, 2021 her company has an anniversary celebration and pt would like to be able to be ambulating. Pt would like to be able to return to work.    OBJECTIVE:   Patient Surveys  FOTO: 42 ,predicted improvement to 16  Cognition Patient is oriented to person, place, and time.  Recent memory is intact.  Remote memory is intact.  Attention span and concentration are intact.  Expressive speech is intact.  Patient's fund of knowledge is within normal limits for educational level.    Gross Musculoskeletal Assessment Tremor: None Bulk: Normal Tone: Normal Excellent healing of residual LLE without any signs of infection. Incision is fully closed.   TRANSFER: Modified independent for transfers with rolling walker and upper extremity support on sitting surface  GAIT: Distance walked: Over 200' Assistive device utilized: Environmental consultant - 2 wheeled Level of assistance: CGA Comments: Initially ambulation practiced in the  parallel bars with step-to pattern but pt almost immediately naturally progressed to step-through pattern. Pt able to progress to ambulation in rehab gym as well as outside to vehicle at end of session with a front wheeled walker. Again pt initially started with step-to pattern but progressed to partial step-through and then full step-through pattern. Slight decrease in LLE stance time and RLE step length. Overall pt is very steady with rolling walker.     Posture: No gross abnormalities noted in standing or seated posture   AROM: Limited bilateral hip IR noted, L knee reaches full extension and flexion. Bilateral hip flexion WNL with some mild restriction in bilateral hip extension   LE MMT:  MMT (out of 5) Right 08/25/2021 Left 08/25/2021  Hip flexion 4+ 4+  Hip extension 4- 4-  Hip abduction 4- 4-  Hip adduction 4 4  Hip internal rotation 5 5  Hip external rotation 5 5  Knee flexion 4- 4  Knee extension 5 5  Ankle dorsiflexion 5  Ankle plantarflexion Active   Ankle inversion    Ankle eversion    (* = pain; Blank rows = not tested)   Sensation Grossly intact to light touch bilateral LEs as determined by testing dermatomes L2-S2. Proprioception, and hot/cold testing deferred on this date.   Reflexes Deferred   Cranial Nerves Deferred   Coordination/Cerebellar Deferred   FUNCTIONAL OUTCOME MEASURES   07/16/21 07/21/21 Comments  BERG  35/56 High fall risk  DGI     FGA     TUG 18.8s  Above cut-off  5TSTS Unable  Decreased LE strength  2 Minute Walk Test 278'  With front wheeled walker   10 Meter Gait Speed Self-selected: 15.3s = 0.65 m/s, fastest: 12.2s = 0.59ms  Below full community ambulation speed  (Blank rows = not tested)  ABC: 41.3%   TODAY'S TREATMENT   SUBJECTIVE: Pt reports that she is doing well today. No pain upon arrival. She has continued ambulating with her single point cane states that occasionally she doesn't even put the cane down on the  ground. No falls. She feels confident going to her company party next week ambulating with a single point cane. No specific questions or concerns currently.  PAIN: No   Ther-ex  NuStep intervals L1/L5-6 x 45s s each for a total of 8:00 minutes with 1 minute warm-up and 1 minute cool-down BLE only (seat 9) during interval history with therapist monitoring fatigue and adjusting resistance for intervals; Supine L SLR with 3# ankle weight (AW) around prosthetic 2 x 15; Hooklying bridges 2 x 15; Hooklying clams with green tband 2 x 15; Hooklying adductor ball squeeze 2 x 15; R sidelying L hip straight leg abduction with 3# AW 2 x 10;   Neuromuscular Re-education  Forward/Retro gait across grass to challenge balance on uneven surface x multiple bouts each; Forward gait across grass with dual cognitive task to challenge balance on uneven surface when attention is divided x multiple bouts; Side stepping across grass both directions x multiple bouts each direction, added dual cognitive task to further challenge balance when attention is divided; Forward gait across grass with horizontal ball tosses with therapist to challenge balance on uneven surface with dual motor task x multiple bouts; Forward gait across grass with vertical ball tosses to self to challenge balance on uneven surface with dual motor task x multiple bouts;   Not performed today: Feet together eyes open x 30s; Feet together eyes closed x 60s; Half tandem eyes open alternating forward LE x 30s each; Sit to stand without UE support from chair with Airex pad on seat 2 x 10; Tandem balance alternating forward LE x 30s each; Staggered stance balance with front foot on 6" step alternating LE positions x 30s each; Alternating 6" step taps without UE support x 10 each; Alternating 12" step taps without UE support x 10 each; Airex alternating 6" step taps without UE support x 10 each; Airex balance with front foot on 6" step and  rearfoot on Airex pad alternating forward LE x 30s each; Standing exercises with 4# ankle weights: Hip flexion marches x 15 BLE; Hamstring curls x 15 BLE; Hip abduction x 15 BLE; Hip extension x 15 BLE; Mini squats with mirror feedback for equal weight distribution between BLE x 10; Seated LAQ with 4# AW x 15 BLE; Cross-over side stepping in // bars without UE support x 2 lengths; Total Gym (TG) Level 22 (L22) double leg squats x 10; TG L15 L single leg squats  2 x 5; Practiced stairs with no UE support during ascent and single UE support on rail for descent step-to pattern x 3 trials, x 2 trials; 6" and 12" hurdle step walking in // bars without UE support 2 x 4 lengths; Airex balance beam tandem balance alternating forward LE x 30s each; Airex balance beam tandem gait x 4 lengths; Airex balance beam side stepping x 4 lengths;   PATIENT EDUCATION:  Education details: Pt educated throughout session about proper posture and technique with exercises. Improved exercise technique, movement at target joints, use of target muscles after min to mod verbal, visual, tactile cues, stair training Person educated: Patient Education method: Explanation Education comprehension: verbalized understanding and returned demonstration   HOME EXERCISE PROGRAM: Access Code: [email or web address] URL: [email or web address] Date: 07/21/2021 Prepared by: Roxana Hires  Exercises - Supine Active Straight Leg Raise (Mirrored)  - 1 x daily - 7 x weekly - 2-3 sets - 10 reps - 3s hold - Sidelying Hip Abduction (Mirrored)  - 1 x daily - 7 x weekly - 2-3 sets - 10 reps - 3s hold - Seated Long Arc Quad with Ankle Weight (Mirrored)  - 1 x daily - 7 x weekly - 2-3 sets - 10 reps - 3s hold - Mini Squat with Counter Support  - 1 x daily - 7 x weekly - 2-3 sets - 10 reps - Standing March with Counter Support  - 1 x daily - 7 x weekly - 2-3 sets - 10 reps - 1-2s hold - Standing Romberg to 1/2 Tandem Stance  - 1 x  daily - 7 x weekly - 3 x 30s  hold - Corner Balance Feet Together With Eyes Closed  - 1 x daily - 7 x weekly - 3 x 30s  hold   ASSESSMENT:  CLINICAL IMPRESSION: Pt demonstrates excellent motivation during session today. Progressed pt to balance practice outside on the grass without assistive device. Also added dual cognitive and motor tasks for further challenge to balance. Mat table strengthening performed afterward for improved strength and also because pt is notably fatigued after ambulation outside.  However, her endurance is improving from session to session. Pt encouraged to continue HEP and follow-up as scheduled. Pt will benefit from PT services to address deficits in strength, balance, and mobility in order to return to full function at home and work.    REHAB POTENTIAL: Excellent  CLINICAL DECISION MAKING: Stable/uncomplicated  EVALUATION COMPLEXITY: Low   GOALS: Goals reviewed with patient? No  SHORT TERM GOALS: Target date: 08/11/2021  Pt will be independent with HEP in order to improve strength and balance in order to decrease fall risk and improve function at home. Baseline:  Goal status: ACHIEVED   LONG TERM GOALS: Target date: 09/08/2021  Pt will increase FOTO to at least 53 to demonstrate significant improvement in function at home related to balance  Baseline: 07/14/21: 34; 08/18/21: 55 Goal status: ACHIEVED  2.  Pt will improve BERG to at least 50/56 points in order to demonstrate clinically significant improvement in balance.   Baseline: 07/21/21: 35/56; 08/18/21: 45/56 Goal status: REVISED  3.  Pt will improve ABC by at least 13% in order to demonstrate clinically significant improvement in balance confidence.    Baseline: 07/16/21: 41.3%, 08/18/21: 72.5% Goal status: ACHIEVED  4. Pt will decrease 5TSTS by at least 3 seconds in order to demonstrate clinically significant improvement in LE strength      Baseline: 07/16/21: Pt unable to perform sit to stand  without  UE support; 08/18/21: 11.6s, minA+1 for balance; Goal status: ACHIEVED  5. Pt will increased 2MWT to greater than 390' with a single point cane without loss of balance in order to participate in her company celebration safely ambulating with a single point cane. Baseline: 07/14/21: limited ambulation with front wheeled walker, 07/16/21: 278' with front wheeled walker; 08/18/21: 292' with single point cane and supervision from therapist Goal status: REVISED   PLAN: PT FREQUENCY: 2x/week  PT DURATION: 8 weeks  PLANNED INTERVENTIONS: Therapeutic exercises, Therapeutic activity, Neuromuscular re-education, Balance training, Gait training, Patient/Family education, Joint manipulation, Joint mobilization, Canalith repositioning, Aquatic Therapy, Dry Needling, Cognitive remediation, Electrical stimulation, Spinal manipulation, Spinal mobilization, Cryotherapy, Moist heat, Traction, Ultrasound, Ionotophoresis 21m/ml Dexamethasone, and Manual therapy  PLAN FOR NEXT SESSION: continue strengthening/balance exercises, review/modify HEP as appropriate  JLyndel SafeHuprich PT, DPT, GCS  Alyssamarie Mounsey 08/25/2021, 3:40 PM

## 2021-08-27 DIAGNOSIS — M869 Osteomyelitis, unspecified: Secondary | ICD-10-CM | POA: Diagnosis not present

## 2021-08-27 DIAGNOSIS — R531 Weakness: Secondary | ICD-10-CM | POA: Diagnosis not present

## 2021-09-01 ENCOUNTER — Ambulatory Visit: Payer: BC Managed Care – PPO | Attending: Nurse Practitioner

## 2021-09-01 ENCOUNTER — Other Ambulatory Visit (INDEPENDENT_AMBULATORY_CARE_PROVIDER_SITE_OTHER): Payer: Self-pay | Admitting: Vascular Surgery

## 2021-09-01 DIAGNOSIS — R262 Difficulty in walking, not elsewhere classified: Secondary | ICD-10-CM | POA: Insufficient documentation

## 2021-09-01 DIAGNOSIS — M6281 Muscle weakness (generalized): Secondary | ICD-10-CM | POA: Diagnosis not present

## 2021-09-01 DIAGNOSIS — I70201 Unspecified atherosclerosis of native arteries of extremities, right leg: Secondary | ICD-10-CM

## 2021-09-01 NOTE — Therapy (Signed)
OUTPATIENT PHYSICAL THERAPY PROSTHETIC TREATMENT   Patient Name: Rebecca Park MRN: [id number] DOB:[birth year], 61 y.o., female Today's Date: 09/02/2021   PT End of Session - 09/01/21 1524     Visit Number 13    Number of Visits 17    Date for PT Re-Evaluation 09/08/21    Authorization Type eval: 07/14/21    PT Start Time 1530    PT Stop Time 1615    PT Time Calculation (min) 45 min    Equipment Utilized During Treatment Gait belt    Activity Tolerance Patient tolerated treatment well    Behavior During Therapy WFL for tasks assessed/performed             Past Medical History:  Diagnosis Date   Diabetes mellitus without complication ([place])    Hypertension    PONV (postoperative nausea and vomiting)    the day after rt 5th toe amputation surgery   Past Surgical History:  Procedure Laterality Date   AMPUTATION Right 11/10/2018   Procedure: AMPUTATION RAY (amputation 5th metatarsal Right foot;  Surgeon: Rebecca Park, DPM;  Location: [facility];  Service: Podiatry;  Laterality: Right;   AMPUTATION Left 03/15/2021   Procedure: AMPUTATION BELOW KNEE;  Surgeon: Rebecca Chess, MD;  Location: [facility];  Service: Vascular;  Laterality: Left;   AMPUTATION TOE Right 12/05/2018   Procedure: RAY RIGHT 4TH AMPUTATION;  Surgeon: Rebecca Park, DPM;  Location: [place];  Service: Podiatry;  Laterality: Right;  mac with local DIABETIC-oral meds   BONE BIOPSY Right 11/10/2018   Procedure: BONE BIOPSY- Right 4th toe;  Surgeon: Rebecca Park, DPM;  Location: [facility];  Service: Podiatry;  Laterality: Right;   BUNIONECTOMY Bilateral    COLONOSCOPY WITH PROPOFOL N/A 09/07/2018   Procedure: COLONOSCOPY WITH PROPOFOL;  Surgeon: Rebecca Landsman, MD;  Location: [facility];  Service: Gastroenterology;  Laterality: N/A;   COLONOSCOPY WITH PROPOFOL N/A 09/17/2019   Procedure: COLONOSCOPY WITH PROPOFOL;  Surgeon: Rebecca Landsman, MD;  Location: [facility] ENDOSCOPY;  Service:  Gastroenterology;  Laterality: N/A;   TEE WITHOUT CARDIOVERSION N/A 03/18/2021   Procedure: TRANSESOPHAGEAL ECHOCARDIOGRAM (TEE);  Surgeon: Rebecca Merritts, MD;  Location: [facility];  Service: Cardiovascular;  Laterality: N/A;   Patient Active Problem List   Diagnosis Date Noted   Severe sepsis with acute organ dysfunction due to methicillin susceptible Staphylococcus aureus (MSSA) ([place]) 03/21/2021   Status post amputation of toe of right foot ([place]) 04/08/2020   Osteomyelitis ([place]) 11/08/2018   HLD (hyperlipidemia) 09/17/2018   Diabetes mellitus ([place]) 03/27/2018   White coat syndrome with diagnosis of hypertension 03/27/2018    PCP: Rebecca Elk, MD  REFERRING PROVIDER: Marinda Elk, MD  REFERRING DIAGNOSIS: Z47.89 (ICD-10-CM) - Encounter for prosthetic gait training  THERAPY DIAG: Difficulty in walking, not elsewhere classified  Muscle weakness (generalized)  ONSET DATE: 03/15/21  FOLLOW UP APPT WITH PROVIDER: Yes   FROM INITIAL EVALUATION 07/14/21  SUBJECTIVE:  Chief Complaint: L BKA  Pertinent History Rebecca Park is a 61 year old female underwent a left below-knee amputation on 03/15/2021 secondary to a LLE ulceration and sepsis. The patient discharged to a rehab facility where she stayed for about 37 days per her report. She worked with therapy at the facility and received her LLE prosthesis about 1.5 weeks ago. She states that she has done some standing and weight shifting in the prosthesis but no true ambulation. She states good healing in the distal LLE and has had minimal pain including very infrequent phantom LLE pain/itching.   Pain: No, no pain currently. She has infrequent LLE phantom pain/itching.  Numbness/Tingling: No Focal Weakness: No, generalized weakness secondary   Recent changes in overall health/medication: No Prior history of physical therapy for balance:  No Falls: Has patient fallen in last 6 months? 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 Spouse works during the day Lives in: House/apartment, one level. Has a ramp (a little steep but she could navigate with a wheelchair if necessary); Has following equipment at home: Single point cane, Walker - 2 wheeled, Wheelchair (manual), shower chair, bed side commode, Grab bars, and Ramped entry   Patient Goals: August 27, 2021 her company has an anniversary celebration and pt would like to be able to be ambulating. Pt would like to be able to return to work.    OBJECTIVE:   Patient Surveys  FOTO: 93 ,predicted improvement to 70  Cognition Patient is oriented to person, place, and time.  Recent memory is intact.  Remote memory is intact.  Attention span and concentration are intact.  Expressive speech is intact.  Patient's fund of knowledge is within normal limits for educational level.    Gross Musculoskeletal Assessment Tremor: None Bulk: Normal Tone: Normal Excellent healing of residual LLE without any signs of infection. Incision is fully closed.   TRANSFER: Modified independent for transfers with rolling walker and upper extremity support on sitting surface  GAIT: Distance walked: Over 200' Assistive device utilized: Environmental consultant - 2 wheeled Level of assistance: CGA Comments: Initially ambulation practiced in the  parallel bars with step-to pattern but pt almost immediately naturally progressed to step-through pattern. Pt able to progress to ambulation in rehab gym as well as outside to vehicle at end of session with a front wheeled walker. Again pt initially started with step-to pattern but progressed to partial step-through and then full step-through pattern. Slight decrease in LLE stance time and RLE step length. Overall pt is very steady with rolling walker.     Posture: No gross abnormalities noted in standing or seated posture   AROM: Limited bilateral hip IR noted, L knee reaches full extension and flexion. Bilateral hip flexion WNL with some mild restriction in bilateral hip extension   LE MMT:  MMT (out of 5) Right 09/02/2021 Left 09/02/2021  Hip flexion 4+ 4+  Hip extension 4- 4-  Hip abduction 4- 4-  Hip adduction 4 4  Hip internal rotation 5 5  Hip external rotation 5 5  Knee flexion 4- 4  Knee extension 5 5  Ankle dorsiflexion 5  Ankle plantarflexion Active   Ankle inversion    Ankle eversion    (* = pain; Blank rows = not tested)   Sensation Grossly intact to light touch bilateral LEs as determined by testing dermatomes L2-S2. Proprioception, and hot/cold testing deferred on this date.   Reflexes Deferred   Cranial Nerves Deferred   Coordination/Cerebellar Deferred   FUNCTIONAL OUTCOME MEASURES   07/16/21 07/21/21 Comments  BERG  35/56 High fall risk  DGI     FGA     TUG 18.8s  Above cut-off  5TSTS Unable  Decreased LE strength  2 Minute Walk Test 278'  With front wheeled walker   10 Meter Gait Speed Self-selected: 15.3s = 0.65 m/s, fastest: 12.2s = 0.1ms  Below full community ambulation speed  (Blank rows = not tested)  ABC: 41.3%   TODAY'S TREATMENT   SUBJECTIVE: Pt reports that she is doing well today. No pain upon arrival but she does report some intermittent L knee pain. No falls. She was able to attend her company celebration ambulating with a  single point cane. No specific questions or concerns currently.   PAIN: No   Neuromuscular Re-education  NuStep intervals L1/L5-6 x 45s s each for a total of 9:00 minutes with 1 minute warm-up and 1 minute cool-down BLE only (seat 9) during interval history with therapist monitoring fatigue and adjusting resistance for intervals; Airex balance beam tandem balance alternating forward LE x 30s each; Airex balance beam tandem balance alternating forward LE with horizontal and vertical head turns x 30s each; Airex balance beam tandem gait x 4 lengths; Airex balance beam side stepping x 4 lengths; Picking up cones while walking from ground in // bars x 2 lengths; Airex mini squats x 10; Practiced stairs with no UE support during ascent and single UE support on rail for descent step-to pattern x 3 trials, x 2 trials;   Not performed today: Half tandem eyes open alternating forward LE x 30s each; Sit to stand without UE support from chair with Airex pad on seat 2 x 10; Tandem balance alternating forward LE x 30s each; Staggered stance balance with front foot on 6" step alternating LE positions x 30s each; Alternating 6" step taps without UE support x 10 each; Alternating 12" step taps without UE support x 10 each; Airex alternating 6" step taps without UE support x 10 each; Airex balance with front foot on 6" step and rearfoot on Airex pad alternating forward LE x 30s each; Mini squats with mirror feedback for equal weight distribution between BLE x 10; Seated LAQ with 4# AW x 15 BLE; Cross-over side stepping in // bars without UE support x 2 lengths; Total Gym (TG) Level 22 (L22) double leg squats x 10; TG L15 L single leg squats 2 x 5; 6" and 12" hurdle step walking in // bars without UE support 2 x 4 lengths; Forward/Retro gait across grass to challenge balance on uneven surface x multiple bouts each; Forward gait across grass with dual cognitive task to challenge balance on uneven  surface when attention is divided x multiple bouts; Side stepping across grass both directions x multiple bouts each direction, added dual cognitive task to further challenge balance when attention is divided; Forward gait across grass with horizontal ball tosses with therapist to challenge balance on uneven surface with dual motor task x multiple bouts; Forward gait across grass with vertical ball tosses to self to challenge balance on uneven surface with dual motor task x  multiple bouts; Standing exercises with 4# ankle weights: Hip flexion marches x 15 BLE; Hamstring curls x 15 BLE; Hip abduction x 15 BLE; Hip extension x 15 BLE; Supine L SLR with 3# ankle weight (AW) around prosthetic 2 x 15; Hooklying bridges 2 x 15; Hooklying clams with green tband 2 x 15; Hooklying adductor ball squeeze 2 x 15; R sidelying L hip straight leg abduction with 3# AW 2 x 10;   PATIENT EDUCATION:  Education details: Pt educated throughout session about proper posture and technique with exercises. Improved exercise technique, movement at target joints, use of target muscles after min to mod verbal, visual, tactile cues, stair training Person educated: Patient Education method: Explanation Education comprehension: verbalized understanding and returned demonstration   HOME EXERCISE PROGRAM: Access Code: [email or web address] URL: [email or web address] Date: 07/21/2021 Prepared by: Roxana Hires  Exercises - Supine Active Straight Leg Raise (Mirrored)  - 1 x daily - 7 x weekly - 2-3 sets - 10 reps - 3s hold - Sidelying Hip Abduction (Mirrored)  - 1 x daily - 7 x weekly - 2-3 sets - 10 reps - 3s hold - Seated Long Arc Quad with Ankle Weight (Mirrored)  - 1 x daily - 7 x weekly - 2-3 sets - 10 reps - 3s hold - Mini Squat with Counter Support  - 1 x daily - 7 x weekly - 2-3 sets - 10 reps - Standing March with Counter Support  - 1 x daily - 7 x weekly - 2-3 sets - 10 reps - 1-2s hold - Standing Romberg  to 1/2 Tandem Stance  - 1 x daily - 7 x weekly - 3 x 30s  hold - Corner Balance Feet Together With Eyes Closed  - 1 x daily - 7 x weekly - 3 x 30s  hold   ASSESSMENT:  CLINICAL IMPRESSION: Pt demonstrates excellent motivation during session today. Focused mostly on balance exercises and utilized Airex pads for unstable surfaces. Pt is demonstrating significant improvement in her stability and is now able to confidently ambulate in the community with a single point cane. Pt encouraged to continue HEP and follow-up as scheduled. She will benefit from PT services to address deficits in strength, balance, and mobility in order to return to full function at home and work.    REHAB POTENTIAL: Excellent  CLINICAL DECISION MAKING: Stable/uncomplicated  EVALUATION COMPLEXITY: Low   GOALS: Goals reviewed with patient? No  SHORT TERM GOALS: Target date: 08/11/2021  Pt will be independent with HEP in order to improve strength and balance in order to decrease fall risk and improve function at home. Baseline:  Goal status: ACHIEVED   LONG TERM GOALS: Target date: 09/08/2021  Pt will increase FOTO to at least 53 to demonstrate significant improvement in function at home related to balance  Baseline: 07/14/21: 34; 08/18/21: 55 Goal status: ACHIEVED  2.  Pt will improve BERG to at least 50/56 points in order to demonstrate clinically significant improvement in balance.   Baseline: 07/21/21: 35/56; 08/18/21: 45/56 Goal status: REVISED  3.  Pt will improve ABC by at least 13% in order to demonstrate clinically significant improvement in balance confidence.    Baseline: 07/16/21: 41.3%, 08/18/21: 72.5% Goal status: ACHIEVED  4. Pt will decrease 5TSTS by at least 3 seconds in order to demonstrate clinically significant improvement in LE strength      Baseline: 07/16/21: Pt unable to perform sit to stand without UE support; 08/18/21: 11.6s, minA+1 for balance; Goal status: ACHIEVED  5. Pt will increased  2MWT to greater than 390' with a single point cane without loss of balance in order to participate in her company celebration safely ambulating with a single point cane. Baseline: 07/14/21: limited ambulation with front wheeled walker, 07/16/21: 278' with front wheeled walker; 08/18/21: 292' with single point cane and supervision from therapist Goal status: REVISED   PLAN: PT FREQUENCY: 2x/week  PT DURATION: 8 weeks  PLANNED INTERVENTIONS: Therapeutic exercises, Therapeutic activity, Neuromuscular re-education, Balance training, Gait training, Patient/Family education, Joint manipulation, Joint mobilization, Canalith repositioning, Aquatic Therapy, Dry Needling, Cognitive remediation, Electrical stimulation, Spinal manipulation, Spinal mobilization, Cryotherapy, Moist heat, Traction, Ultrasound, Ionotophoresis 61m/ml Dexamethasone, and Manual therapy  PLAN FOR NEXT SESSION: continue strengthening/balance exercises, review/modify HEP as appropriate  JLyndel SafeHuprich PT, DPT, GCS  Jaimie Pippins 09/02/2021, 1:21 PM

## 2021-09-02 ENCOUNTER — Ambulatory Visit (INDEPENDENT_AMBULATORY_CARE_PROVIDER_SITE_OTHER): Payer: BC Managed Care – PPO

## 2021-09-02 ENCOUNTER — Ambulatory Visit (INDEPENDENT_AMBULATORY_CARE_PROVIDER_SITE_OTHER): Payer: BC Managed Care – PPO | Admitting: Nurse Practitioner

## 2021-09-02 ENCOUNTER — Encounter (INDEPENDENT_AMBULATORY_CARE_PROVIDER_SITE_OTHER): Payer: Self-pay | Admitting: Nurse Practitioner

## 2021-09-02 VITALS — BP 107/73 | HR 98 | Resp 16 | Wt 199.0 lb

## 2021-09-02 DIAGNOSIS — I70201 Unspecified atherosclerosis of native arteries of extremities, right leg: Secondary | ICD-10-CM | POA: Diagnosis not present

## 2021-09-02 DIAGNOSIS — Z89511 Acquired absence of right leg below knee: Secondary | ICD-10-CM

## 2021-09-02 DIAGNOSIS — E0821 Diabetes mellitus due to underlying condition with diabetic nephropathy: Secondary | ICD-10-CM | POA: Diagnosis not present

## 2021-09-08 ENCOUNTER — Ambulatory Visit: Payer: BC Managed Care – PPO | Admitting: Physical Therapy

## 2021-09-08 DIAGNOSIS — M6281 Muscle weakness (generalized): Secondary | ICD-10-CM

## 2021-09-08 DIAGNOSIS — R262 Difficulty in walking, not elsewhere classified: Secondary | ICD-10-CM

## 2021-09-08 DIAGNOSIS — E113513 Type 2 diabetes mellitus with proliferative diabetic retinopathy with macular edema, bilateral: Secondary | ICD-10-CM | POA: Diagnosis not present

## 2021-09-08 NOTE — Therapy (Signed)
OUTPATIENT PHYSICAL THERAPY PROSTHETIC TREATMENT/RE-CERTIFICATION NOTE  Reporting period: 07/14/21 - 09/08/21   Patient Name: Rebecca Park MRN: [id number] DOB:March 11, 1961, 61 y.o., female Today's Date: 09/08/2021   PT End of Session - 09/08/21 1818     Visit Number 14    Number of Visits 33    Date for PT Re-Evaluation 11/03/21    Authorization Type eval: 07/14/21    PT Start Time 1701    PT Stop Time 1728    PT Time Calculation (min) 27 min    Equipment Utilized During Treatment Gait belt    Activity Tolerance Patient tolerated treatment well    Behavior During Therapy WFL for tasks assessed/performed              Past Medical History:  Diagnosis Date   Diabetes mellitus without complication ([place])    Hypertension    PONV (postoperative nausea and vomiting)    the day after rt 5th toe amputation surgery   Past Surgical History:  Procedure Laterality Date   AMPUTATION Right 11/10/2018   Procedure: AMPUTATION RAY (amputation 5th metatarsal Right foot;  Surgeon: Caroline More, DPM;  Location: [facility];  Service: Podiatry;  Laterality: Right;   AMPUTATION Left 03/15/2021   Procedure: AMPUTATION BELOW KNEE;  Surgeon: Zara Chess, MD;  Location: [facility];  Service: Vascular;  Laterality: Left;   AMPUTATION TOE Right 12/05/2018   Procedure: RAY RIGHT 4TH AMPUTATION;  Surgeon: Caroline More, DPM;  Location: [facility];  Service: Podiatry;  Laterality: Right;  mac with local DIABETIC-oral meds   BONE BIOPSY Right 11/10/2018   Procedure: BONE BIOPSY- Right 4th toe;  Surgeon: Caroline More, DPM;  Location: [facility];  Service: Podiatry;  Laterality: Right;   BUNIONECTOMY Bilateral    COLONOSCOPY WITH PROPOFOL N/A 09/07/2018   Procedure: COLONOSCOPY WITH PROPOFOL;  Surgeon: Lin Landsman, MD;  Location: [facility];  Service: Gastroenterology;  Laterality: N/A;   COLONOSCOPY WITH PROPOFOL N/A 09/17/2019   Procedure: COLONOSCOPY WITH PROPOFOL;  Surgeon: Lin Landsman, MD;  Location: [facility] ENDOSCOPY;  Service: Gastroenterology;  Laterality: N/A;   TEE WITHOUT CARDIOVERSION N/A 03/18/2021   Procedure: TRANSESOPHAGEAL ECHOCARDIOGRAM (TEE);  Surgeon: Minna Merritts, MD;  Location: [facility];  Service: Cardiovascular;  Laterality: N/A;   Patient Active Problem List   Diagnosis Date Noted   Severe sepsis with acute organ dysfunction due to methicillin susceptible Staphylococcus aureus (MSSA) ([place]) 03/21/2021   Status post amputation of toe of right foot ([place]) 04/08/2020   Osteomyelitis ([place]) 11/08/2018   HLD (hyperlipidemia) 09/17/2018   Diabetes mellitus ([place]) 03/27/2018   White coat syndrome with diagnosis of hypertension 03/27/2018    PCP: Marinda Elk, MD  REFERRING PROVIDER: Kris Hartmann, NP  REFERRING DIAGNOSIS: Z47.89 (ICD-10-CM) - Encounter for prosthetic gait training  THERAPY DIAG: Difficulty in walking, not elsewhere classified  Muscle weakness (generalized)  ONSET DATE: 03/15/21  FOLLOW UP APPT WITH PROVIDER: Yes   FROM INITIAL EVALUATION 07/14/21  SUBJECTIVE:  Chief Complaint: L BKA  Pertinent History Rebecca Park is a 61 year old female underwent a left below-knee amputation on 03/15/2021 secondary to a LLE ulceration and sepsis. The patient discharged to a rehab facility where she stayed for about 37 days per her report. She worked with therapy at the facility and received her LLE prosthesis about 1.5 weeks ago. She states that she has done some standing and weight shifting in the prosthesis but no true ambulation. She states good healing in the distal LLE and has had minimal pain including very infrequent phantom LLE pain/itching.   Pain: No, no pain currently. She has infrequent LLE phantom pain/itching.  Numbness/Tingling: No Focal  Weakness: No, generalized weakness secondary  Recent changes in overall health/medication: No Prior history of physical therapy for balance:  No Falls: Has patient fallen in last 6 months? 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 Spouse works during the day Lives in: House/apartment, one level. Has a ramp (a little steep but she could navigate with a wheelchair if necessary); Has following equipment at home: Single point cane, Walker - 2 wheeled, Wheelchair (manual), shower chair, bed side commode, Grab bars, and Ramped entry   Patient Goals: August 27, 2021 her company has an anniversary celebration and pt would like to be able to be ambulating. Pt would like to be able to return to work.    OBJECTIVE:   Patient Surveys  FOTO: 62 ,predicted improvement to 62  Cognition Patient is oriented to person, place, and time.  Recent memory is intact.  Remote memory is intact.  Attention span and concentration are intact.  Expressive speech is intact.  Patient's fund of knowledge is within normal limits for educational level.    Gross Musculoskeletal Assessment Tremor: None Bulk: Normal Tone: Normal Excellent healing of residual LLE without any signs of infection. Incision is fully closed.   TRANSFER: Modified independent for transfers with rolling walker and upper extremity support on sitting surface  GAIT: Distance walked: Over 200' Assistive device utilized: Environmental consultant - 2 wheeled Level of assistance: CGA Comments:  Initially ambulation practiced in the parallel bars with step-to pattern but pt almost immediately naturally progressed to step-through pattern. Pt able to progress to ambulation in rehab gym as well as outside to vehicle at end of session with a front wheeled walker. Again pt initially started with step-to pattern but progressed to partial step-through and then full step-through pattern. Slight decrease in LLE stance time and RLE step length. Overall pt is very steady with rolling walker.     Posture: No gross abnormalities noted in standing or seated posture   AROM: Limited bilateral hip IR noted, L knee reaches full extension and flexion. Bilateral hip flexion WNL with some mild restriction in bilateral hip extension   LE MMT:  MMT (out of 5) Right 09/08/2021 Left 09/08/2021  Hip flexion 4+ 4+  Hip extension 4- 4-  Hip abduction 4- 4-  Hip adduction 4 4  Hip internal rotation 5 5  Hip external rotation 5 5  Knee flexion 4- 4  Knee extension 5 5  Ankle dorsiflexion 5  Ankle plantarflexion Active   Ankle inversion    Ankle eversion    (* = pain; Blank rows = not tested)   Sensation Grossly intact to light touch bilateral LEs as determined by testing dermatomes L2-S2. Proprioception, and hot/cold testing deferred on this date.   Reflexes Deferred   Cranial Nerves Deferred   Coordination/Cerebellar Deferred   FUNCTIONAL OUTCOME MEASURES   07/16/21 07/21/21 Comments  BERG  35/56 High fall risk  DGI     FGA     TUG 18.8s  Above cut-off  5TSTS Unable  Decreased LE strength  2 Minute Walk Test 278'  With front wheeled walker   10 Meter Gait Speed Self-selected: 15.3s = 0.65 m/s, fastest: 12.2s = 0.37ms  Below full community ambulation speed  (Blank rows = not tested)  ABC: 41.3%   TODAY'S TREATMENT   SUBJECTIVE: Pt reports that she returned to the office this week; she has been standing and walking more than usual. States her residual limb is bothering her.  States pain will rise up to 7/10, currently 5-6/10. Pain described specifically to anterior lower limb over bony prominence of tibia. States pain may be related to return to work in physical office rather than working from home. Arrives ambulating with SPC. No falls.   PAIN: 5-6/10   Neuromuscular Re-education  NuStep intervals L1/L5 x 45s s each for a total of 10:00 minutes with 1 minute warm-up and 1 minute cool-down BLE only (seat 9) during interval history with therapist monitoring fatigue and adjusting resistance for intervals; Airex balance beam tandem balance alternating forward LE x 60s each;  Observation Performed visual check of pt residual limb after pt reports continuous pain and at location of high stress over bony prominence of distal tibia. Blood noted as pt was doffing gel interface; red in color, some dried onto skin and some fresh in liquid form. After cleaning blood off skin, PT noted erythremia and bruising at distal limb, especially at tibial bony prominence, matching pt description of pain location. A small laceration was noted over the distal tibia measuring 334m was not actively bleeding in NWB with prosthetic removed. Did apply a bandage to contain blood until pt is home. PT advised pt to stay off her feet and not wear prosthetic limb until wound is healed and she has met with prosthetist.  PT advised pt to meet with prosthetist as soon as possible to have adjustments made as needed. Also educated on signs and symptoms of infection and to contact MD if symptoms arise.   Ended session once limb was clean and dry. Did reapply prosthetic with pt educated only for duration required to return home.     Not performed today: Airex balance beam tandem balance alternating forward LE with horizontal and vertical head turns x 60s each; Airex balance beam tandem gait x 4 lengths; Airex balance beam side stepping x 4 lengths; Picking up cones while walking from ground in // bars x 2  lengths; Airex mini squats x 10; Practiced stairs with no UE support during ascent and single UE support on rail for descent step-to pattern x 3 trials, x 2 trials; Half tandem eyes open alternating forward LE x 30s each; Sit to stand without UE support from chair with Airex pad on seat 2 x 10; Tandem balance alternating forward LE x 30s each; Staggered stance balance with front foot on 6" step alternating LE positions x 30s each; Alternating 6" step taps without UE support x 10 each; Alternating  12" step taps without UE support x 10 each; Airex alternating 6" step taps without UE support x 10 each; Airex balance with front foot on 6" step and rearfoot on Airex pad alternating forward LE x 30s each; Mini squats with mirror feedback for equal weight distribution between BLE x 10; Seated LAQ with 4# AW x 15 BLE; Cross-over side stepping in // bars without UE support x 2 lengths; Total Gym (TG) Level 22 (L22) double leg squats x 10; TG L15 L single leg squats 2 x 5; 6" and 12" hurdle step walking in // bars without UE support 2 x 4 lengths; Forward/Retro gait across grass to challenge balance on uneven surface x multiple bouts each; Forward gait across grass with dual cognitive task to challenge balance on uneven surface when attention is divided x multiple bouts; Side stepping across grass both directions x multiple bouts each direction, added dual cognitive task to further challenge balance when attention is divided; Forward gait across grass with horizontal ball tosses with therapist to challenge balance on uneven surface with dual motor task x multiple bouts; Forward gait across grass with vertical ball tosses to self to challenge balance on uneven surface with dual motor task x multiple bouts; Standing exercises with 4# ankle weights: Hip flexion marches x 15 BLE; Hamstring curls x 15 BLE; Hip abduction x 15 BLE; Hip extension x 15 BLE; Supine L SLR with 3# ankle weight (AW) around  prosthetic 2 x 15; Hooklying bridges 2 x 15; Hooklying clams with green tband 2 x 15; Hooklying adductor ball squeeze 2 x 15; R sidelying L hip straight leg abduction with 3# AW 2 x 10;   PATIENT EDUCATION:  Education details: Pt educated throughout session about proper posture and technique with exercises. Improved exercise technique, movement at target joints, use of target muscles after min to mod verbal, visual, tactile cues, stair training Person educated: Patient Education method: Explanation Education comprehension: verbalized understanding and returned demonstration   HOME EXERCISE PROGRAM: Access Code: [email or web address] URL: [email or web address] Date: 07/21/2021 Prepared by: Roxana Hires  Exercises - Supine Active Straight Leg Raise (Mirrored)  - 1 x daily - 7 x weekly - 2-3 sets - 10 reps - 3s hold - Sidelying Hip Abduction (Mirrored)  - 1 x daily - 7 x weekly - 2-3 sets - 10 reps - 3s hold - Seated Long Arc Quad with Ankle Weight (Mirrored)  - 1 x daily - 7 x weekly - 2-3 sets - 10 reps - 3s hold - Mini Squat with Counter Support  - 1 x daily - 7 x weekly - 2-3 sets - 10 reps - Standing March with Counter Support  - 1 x daily - 7 x weekly - 2-3 sets - 10 reps - 1-2s hold - Standing Romberg to 1/2 Tandem Stance  - 1 x daily - 7 x weekly - 3 x 30s  hold - Corner Balance Feet Together With Eyes Closed  - 1 x daily - 7 x weekly - 3 x 30s  hold   ASSESSMENT:  CLINICAL IMPRESSION: Pt arrives willing to work however reporting pain in residual limb that is unusual to her. Did initiate session however once location of pain was described (over high pressure area of distal tibia), PT asked to perform a visual check of limb. Blood was first noted, followed by erythremia, bruising and finally a small opening in the skin over the distal tibia. PT assisted pt with cleaning limb and prosthetic accessories; applied temporary  bandage and ensured limb was dry prior to re-donning  prosthetic limb. PT educated on next steps including staying off her feet, not wearing prosthesis to allow wound to heal, follow up with prosthetist as soon as possible, monitor for signs and symptoms of infection and contact MD if any arise. Plan to follow up with pt to check how lower limb is healing and if adjustments have been made to prosthesis. She will benefit from PT services to address deficits in strength, balance, and mobility in order to return to full function at home and work.   Please see updated outcome measures performed on 08/18/21 regarding today's re-certification.     REHAB POTENTIAL: Excellent  CLINICAL DECISION MAKING: Stable/uncomplicated  EVALUATION COMPLEXITY: Low   GOALS: Goals reviewed with patient? No  SHORT TERM GOALS: Target date: 08/11/2021  Pt will be independent with HEP in order to improve strength and balance in order to decrease fall risk and improve function at home. Baseline:  Goal status: ACHIEVED   LONG TERM GOALS: Target date: 09/08/2021  Pt will increase FOTO to at least 53 to demonstrate significant improvement in function at home related to balance  Baseline: 07/14/21: 34; 08/18/21: 55 Goal status: ACHIEVED  2.  Pt will improve BERG to at least 50/56 points in order to demonstrate clinically significant improvement in balance.   Baseline: 07/21/21: 35/56; 08/18/21: 45/56 Goal status: REVISED  3.  Pt will improve ABC by at least 13% in order to demonstrate clinically significant improvement in balance confidence.    Baseline: 07/16/21: 41.3%, 08/18/21: 72.5% Goal status: ACHIEVED  4. Pt will decrease 5TSTS by at least 3 seconds in order to demonstrate clinically significant improvement in LE strength      Baseline: 07/16/21: Pt unable to perform sit to stand without UE support; 08/18/21: 11.6s, minA+1 for balance; Goal status: ACHIEVED  5. Pt will increased 2MWT to greater than 390' with a single point cane without loss of balance in order to  participate in her company celebration safely ambulating with a single point cane. Baseline: 07/14/21: limited ambulation with front wheeled walker, 07/16/21: 278' with front wheeled walker; 08/18/21: 292' with single point cane and supervision from therapist Goal status: REVISED   PLAN: PT FREQUENCY: 2x/week  PT DURATION: 8 weeks  PLANNED INTERVENTIONS: Therapeutic exercises, Therapeutic activity, Neuromuscular re-education, Balance training, Gait training, Patient/Family education, Joint manipulation, Joint mobilization, Canalith repositioning, Aquatic Therapy, Dry Needling, Cognitive remediation, Electrical stimulation, Spinal manipulation, Spinal mobilization, Cryotherapy, Moist heat, Traction, Ultrasound, Ionotophoresis 40m/ml Dexamethasone, and Manual therapy  PLAN FOR NEXT SESSION: continue strengthening/balance exercises, review/modify HEP as appropriate   KPatrina LeveringPT, DPT

## 2021-09-10 ENCOUNTER — Ambulatory Visit: Payer: BC Managed Care – PPO

## 2021-09-10 ENCOUNTER — Telehealth (INDEPENDENT_AMBULATORY_CARE_PROVIDER_SITE_OTHER): Payer: Self-pay | Admitting: Nurse Practitioner

## 2021-09-10 NOTE — Telephone Encounter (Signed)
I spoke with Dr Delana Meyer and recommended that patient can come in next week. Patient has been schedule for 09/14/21

## 2021-09-10 NOTE — Telephone Encounter (Signed)
Patient called and LVM stating that [facility], and was having some bleeding from her stomp as well as some fluid. Provider there at the clinic told her she needed to see a provider here in out office within the next two days     Please call and advise

## 2021-09-13 DIAGNOSIS — Z89511 Acquired absence of right leg below knee: Secondary | ICD-10-CM | POA: Insufficient documentation

## 2021-09-13 NOTE — Progress Notes (Unsigned)
MRN : [id number]  Rebecca Park is a 61 y.o. (12/21/1960) female who presents with chief complaint of check circulation.  History of Present Illness:    The patient presents today sooner than expected for a draining site on her amputation stump.  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  She ultimately developed an ulceration on her right lower extremity and this became septic leading to her amputation.  The patient has been in a rehab facility and all staples were removed.  At this time the incision is well-healed with no evidence of dehiscence or drainage.  The patient notes that she has been working diligently with physical therapy and is very ready to proceed with a prosthesis.  No outpatient medications have been marked as taking for the 09/14/21 encounter (Appointment) with Delana Meyer, Dolores Lory, MD.    Past Medical History:  Diagnosis Date   Diabetes mellitus without complication ([place])    Hypertension    PONV (postoperative nausea and vomiting)    the day after rt 5th toe amputation surgery    Past Surgical History:  Procedure Laterality Date   AMPUTATION Right 11/10/2018   Procedure: AMPUTATION RAY (amputation 5th metatarsal Right foot;  Surgeon: Caroline More, DPM;  Location: [facility];  Service: Podiatry;  Laterality: Right;   AMPUTATION Left 03/15/2021   Procedure: AMPUTATION BELOW KNEE;  Surgeon: Zara Chess, MD;  Location: [facility];  Service: Vascular;  Laterality: Left;   AMPUTATION TOE Right 12/05/2018   Procedure: RAY RIGHT 4TH AMPUTATION;  Surgeon: Caroline More, DPM;  Location: [place];  Service: Podiatry;  Laterality: Right;  mac with local DIABETIC-oral meds   BONE BIOPSY Right 11/10/2018   Procedure: BONE BIOPSY- Right 4th toe;  Surgeon: Caroline More, DPM;  Location: [facility];  Service: Podiatry;  Laterality: Right;   BUNIONECTOMY Bilateral    COLONOSCOPY WITH PROPOFOL N/A 09/07/2018   Procedure: COLONOSCOPY WITH PROPOFOL;  Surgeon:  Lin Landsman, MD;  Location[facility] END[facility];  Service: Gastroenterology;  Laterality: N/A;   COLONOSCOPY WITH PROPOFOL N/A 09/17/2019   Procedure: COLONOSCOPY WITH PROPOFOL;  Surgeon: Lin Landsman, MD;  Location: [facility] ENDOSCOPY;  Service: Gastroenterology;  Laterality: N/A;   TEE WITHOUT CARDIOVERSION N/A 03/18/2021   Procedure: TRANSESOPHAGEAL ECHOCARDIOGRAM (TEE);  Surgeon: Minna Merritts, MD;  Location: [facility];  Service: Cardiovascular;  Laterality: N/A;    Social History Social History   Tobacco Use   Smoking status: Never   Smokeless tobacco: Never  Vaping Use   Vaping Use: Never used  Substance Use Topics   Alcohol use: Not Currently    Comment: recently quit 01/2018, 3-4 beers QD   Drug use: Never    Family History Family History  Problem Relation Age of Onset   Heart disease Father    Heart attack Father    Heart disease Maternal Grandfather    Breast cancer Neg Hx     No Known Allergies   REVIEW OF SYSTEMS (Negative unless checked)  Constitutional: _0 Weight loss  _1 Fever  _2 Chills Cardiac: _3 Chest pain   _4 Chest pressure   _5 Palpitations   _6 Shortness of breath when laying flat   _7 Shortness of breath with exertion. Vascular:  _8 Pain in legs with walking   _9 Pain in legs at rest  _10 History of DVT   _11 Phlebitis   _12 Swelling in legs   _13 Varicose veins   _14 Non-healing ulcers Pulmonary:   _15 Uses home oxygen   _16 Productive cough   _17 Hemoptysis   _18 Wheeze  _19   COPD   _0 Asthma Neurologic:  _1 Dizziness   _2 Seizures   _3 History of stroke   _4 History of TIA  _5 Aphasia   _6 Vissual changes   _7 Weakness or numbness in arm   _8 Weakness or numbness in leg Musculoskeletal:   _9 Joint swelling   _10 Joint pain   _11 Low back pain Hematologic:  _12 Easy bruising  _13 Easy bleeding   _14 Hypercoagulable state   _15 Anemic Gastrointestinal:  _16 Diarrhea   _17 Vomiting  _18 Gastroesophageal reflux/heartburn   _19 Difficulty swallowing. Genitourinary:  _20 Chronic kidney disease    _21 Difficult urination  _22 Frequent urination   _23 Blood in urine Skin:  _24 Rashes   _25 Ulcers  Psychological:  _26 History of anxiety   _27  History of major depression.  Physical Examination  There were no vitals filed for this visit. There is no height or weight on file to calculate BMI. Gen: WD/WN, NAD Head: [place]/AT, No temporalis wasting.  Ear/Nose/Throat: Hearing grossly intact, nares w/o erythema or drainage Eyes: PER, EOMI, sclera nonicteric.  Neck: Supple, no masses.  No bruit or JVD.  Pulmonary:  Good air movement, no audible wheezing, no use of accessory muscles.  Cardiac: RRR, normal S1, S2, no Murmurs. Vascular:  mild trophic changes, no open wounds Vessel Right Left  Radial Palpable Palpable  PT Not Palpable BKA  DP Not Palpable BKA  Gastrointestinal: soft, non-distended. No guarding/no peritoneal signs.  Musculoskeletal: M/S 5/5 throughout.  No visible deformity.  Neurologic: CN 2-12 intact. Pain and light touch intact in extremities.  Symmetrical.  Speech is fluent. Motor exam as listed above. Psychiatric: Judgment intact, Mood & affect appropriate for pt's clinical situation. Dermatologic: No rashes or ulcers noted.  No changes consistent with cellulitis.   CBC Lab Results  Component Value Date   WBC 13.1 (H) 03/22/2021   HGB 9.1 (L) 03/22/2021   HCT 29.1 (L) 03/22/2021   MCV 85.1 03/22/2021   PLT 201 03/22/2021    BMET    Component Value Date/Time   NA 137 03/27/2021 0605   K 3.5 03/27/2021 0605   CL 97 (L) 03/27/2021 0605   CO2 32 03/27/2021 0605   GLUCOSE 122 (H) 03/27/2021 0605   BUN 10 03/27/2021 0605   CREATININE 0.74 03/27/2021 0605   CREATININE 0.93 09/15/2018 1545   CALCIUM 8.0 (L) 03/27/2021 0605   GFRNONAA >60 03/27/2021 0605   GFRAA >60 11/11/2018 0356   CrCl cannot be calculated (Patient's most recent lab result is older than the maximum 21 days allowed.).  COAG Lab Results  Component Value Date   INR 1.2 03/14/2021   INR 1.1 03/13/2021     Radiology VAS [place] ABI WITH/WO TBI  Result Date: 09/09/2021  LOWER EXTREMITY DOPPLER STUDY Patient Name:  ANSHIKA PETHTEL  Date of Exam:   09/02/2021 Medical Rec #: [id number]       Accession #:    [id number] Date of Birth: 12/01/1960        Patient Gender: F Patient Age:   59 years Exam Location:  [facility] Procedure:      VAS [place] ABI WITH/WO TBI Referring Phys: Leotis Pain --------------------------------------------------------------------------------  Indications: Peripheral artery disease.  Vascular Interventions: 03/15/2021: Left BKA. Performing Technologist: Almira Coaster RVS  Examination Guidelines: A complete evaluation includes at minimum, Doppler waveform signals and systolic blood pressure reading at the level of bilateral brachial, anterior tibial, and posterior tibial arteries, when vessel segments are accessible. Bilateral testing is considered an integral part of a complete examination. Photoelectric Plethysmograph (PPG) waveforms and toe systolic pressure readings are included  as required and additional duplex testing as needed. Limited examinations for reoccurring indications may be performed as noted.  ABI Findings: +---------+------------------+-----+---------+--------+ Right    Rt Pressure (mmHg)IndexWaveform Comment  +---------+------------------+-----+---------+--------+ Brachial 137                                      +---------+------------------+-----+---------+--------+ ATA      220               1.61 triphasicNC       +---------+------------------+-----+---------+--------+ PTA      211               1.54 triphasicNC       +---------+------------------+-----+---------+--------+ Great Toe162               1.18 Normal            +---------+------------------+-----+---------+--------+ +--------+------------------+-----+--------+-------+ Left    Lt Pressure (mmHg)IndexWaveformComment +--------+------------------+-----+--------+-------+  [id number]                                    +--------+------------------+-----+--------+-------+ ATA                                    Lt BKA  +--------+------------------+-----+--------+-------+ +-------+-----------+-----------+------------+------------+ ABI/TBIToday's ABIToday's TBIPrevious ABIPrevious TBI +-------+-----------+-----------+------------+------------+ Right  >1.0 [place]    1.18                                +-------+-----------+-----------+------------+------------+  Summary: Right: Resting right ankle-brachial index indicates noncompressible right lower extremity arteries. The right toe-brachial index is normal. Left: Left BKA.  *See table(s) above for measurements and observations.  Electronically signed by Leotis Pain MD on 09/09/2021 at 9:37:30 AM.    Final      Assessment/Plan There are no diagnoses linked to this encounter.   Hortencia Pilar, MD  09/13/2021 4:11 PM

## 2021-09-14 ENCOUNTER — Ambulatory Visit (INDEPENDENT_AMBULATORY_CARE_PROVIDER_SITE_OTHER): Payer: BC Managed Care – PPO | Admitting: Vascular Surgery

## 2021-09-14 ENCOUNTER — Encounter (INDEPENDENT_AMBULATORY_CARE_PROVIDER_SITE_OTHER): Payer: Self-pay | Admitting: Nurse Practitioner

## 2021-09-14 ENCOUNTER — Encounter (INDEPENDENT_AMBULATORY_CARE_PROVIDER_SITE_OTHER): Payer: Self-pay | Admitting: Vascular Surgery

## 2021-09-14 VITALS — BP 100/66 | HR 91 | Resp 16 | Wt 201.0 lb

## 2021-09-14 DIAGNOSIS — Z89511 Acquired absence of right leg below knee: Secondary | ICD-10-CM

## 2021-09-14 DIAGNOSIS — E782 Mixed hyperlipidemia: Secondary | ICD-10-CM | POA: Diagnosis not present

## 2021-09-14 DIAGNOSIS — E0821 Diabetes mellitus due to underlying condition with diabetic nephropathy: Secondary | ICD-10-CM | POA: Diagnosis not present

## 2021-09-14 DIAGNOSIS — I10 Essential (primary) hypertension: Secondary | ICD-10-CM | POA: Diagnosis not present

## 2021-09-14 NOTE — Progress Notes (Signed)
Subjective:    Patient ID: Casandra Doffing, female    DOB: 1960-08-14, 61 y.o.   MRN: [id number] No chief complaint on file.   Laylamarie Meuser is a 61 year old female that presents today for follow-up evaluation after recent left below-knee amputation.  The below-knee amputation with a result of osteomyelitis.  Since that time the patient has been doing remarkably well.  She is currently walking with a cane and sometimes.  Currently there is no open wounds or ulcerations on her right lower extremity.  She denies any claudication or rest pain.  Today the patient underwent compressible ABIs with triphasic tibial artery waveforms with normal toe waveforms.    Review of Systems  All other systems reviewed and are negative.      Objective:   Physical Exam Vitals reviewed.  HENT:     Head: Normocephalic.  Cardiovascular:     Rate and Rhythm: Normal rate.  Pulmonary:     Effort: Pulmonary effort is normal.  Skin:    General: Skin is warm and dry.  Neurological:     Mental Status: She is alert and oriented to person, place, and time.     Gait: Gait abnormal.  Psychiatric:        Mood and Affect: Mood normal.        Behavior: Behavior normal.        Thought Content: Thought content normal.        Judgment: Judgment normal.     BP 107/73 (BP Location: Left Arm)   Pulse 98   Resp 16   Wt 199 lb (90.3 kg)   BMI 32.12 kg/m   Past Medical History:  Diagnosis Date   Diabetes mellitus without complication (HCC)    Hypertension    PONV (postoperative nausea and vomiting)    the day after rt 5th toe amputation surgery    Social History   Socioeconomic History   Marital status: Married    Spouse name: Not on file   Number of children: Not on file   Years of education: Not on file   Highest education level: Not on file  Occupational History   Not on file  Tobacco Use   Smoking status: Never   Smokeless tobacco: Never  Vaping Use   Vaping Use: Never used  Substance and  Sexual Activity   Alcohol use: Not Currently    Comment: recently quit 01/2018, 3-4 beers QD   Drug use: Never   Sexual activity: Yes  Other Topics Concern   Not on file  Social History Narrative   Not on file   Social Determinants of Health   Financial Resource Strain: Not on file  Food Insecurity: Not on file  Transportation Needs: Not on file  Physical Activity: Not on file  Stress: Not on file  Social Connections: Not on file  Intimate Partner Violence: Not on file    Past Surgical History:  Procedure Laterality Date   AMPUTATION Right 11/10/2018   Procedure: AMPUTATION RAY (amputation 5th metatarsal Right foot;  Surgeon: Caroline More, DPM;  Location: [facility];  Service: Podiatry;  Laterality: Right;   AMPUTATION Left 03/15/2021   Procedure: AMPUTATION BELOW KNEE;  Surgeon: Zara Chess, MD;  Location: [facility];  Service: Vascular;  Laterality: Left;   AMPUTATION TOE Right 12/05/2018   Procedure: RAY RIGHT 4TH AMPUTATION;  Surgeon: Caroline More, DPM;  Location: [place];  Service: Podiatry;  Laterality: Right;  mac with local DIABETIC-oral meds   BONE  BIOPSY Right 11/10/2018   Procedure: BONE BIOPSY- Right 4th toe;  Surgeon: Caroline More, DPM;  Location: [facility];  Service: Podiatry;  Laterality: Right;   BUNIONECTOMY Bilateral    COLONOSCOPY WITH PROPOFOL N/A 09/07/2018   Procedure: COLONOSCOPY WITH PROPOFOL;  Surgeon: Lin Landsman, MD;  Location: [facility] ENDOSCOPY;  Service: Gastroenterology;  Laterality: N/A;   COLONOSCOPY WITH PROPOFOL N/A 09/17/2019   Procedure: COLONOSCOPY WITH PROPOFOL;  Surgeon: Lin Landsman, MD;  Location: [facility] ENDOSCOPY;  Service: Gastroenterology;  Laterality: N/A;   TEE WITHOUT CARDIOVERSION N/A 03/18/2021   Procedure: TRANSESOPHAGEAL ECHOCARDIOGRAM (TEE);  Surgeon: Minna Merritts, MD;  Location: [facility];  Service: Cardiovascular;  Laterality: N/A;    Family History  Problem Relation Age of Onset   Heart disease  Father    Heart attack Father    Heart disease Maternal Grandfather    Breast cancer Neg Hx     No Known Allergies     Latest Ref Rng & Units 03/22/2021    5:08 AM 03/21/2021    5:35 AM 03/17/2021    4:29 AM  CBC  WBC 4.0 - 10.5 K/uL 13.1  11.9  16.2   Hemoglobin 12.0 - 15.0 g/dL 9.1  8.8  9.5   Hematocrit 36.0 - 46.0 % 29.1  27.7  29.8   Platelets 150 - 400 K/uL 201  177  179       CMP     Component Value Date/Time   NA 137 03/27/2021 0605   K 3.5 03/27/2021 0605   CL 97 (L) 03/27/2021 0605   CO2 32 03/27/2021 0605   GLUCOSE 122 (H) 03/27/2021 0605   BUN 10 03/27/2021 0605   CREATININE 0.74 03/27/2021 0605   CREATININE 0.93 09/15/2018 1545   CALCIUM 8.0 (L) 03/27/2021 0605   PROT 5.9 (L) 03/15/2021 0248   ALBUMIN 2.2 (L) 03/15/2021 0248   AST 21 03/15/2021 0248   ALT 18 03/15/2021 0248   ALKPHOS 148 (H) 03/15/2021 0248   BILITOT 1.6 (H) 03/15/2021 0248   GFRNONAA >60 03/27/2021 0605   GFRAA >60 11/11/2018 0356     VAS [place] ABI WITH/WO TBI  Result Date: 09/09/2021  LOWER EXTREMITY DOPPLER STUDY Patient Name:  EZELLA KELL  Date of Exam:   09/02/2021 Medical Rec #: [id number]       Accession #:    [id number] Date of Birth: 1960-05-07        Patient Gender: F Patient Age:   54 years Exam Location:  [facility] Procedure:      VAS [place] ABI WITH/WO TBI Referring Phys: Leotis Pain --------------------------------------------------------------------------------  Indications: Peripheral artery disease.  Vascular Interventions: 03/15/2021: Left BKA. Performing Technologist: Almira Coaster RVS  Examination Guidelines: A complete evaluation includes at minimum, Doppler waveform signals and systolic blood pressure reading at the level of bilateral brachial, anterior tibial, and posterior tibial arteries, when vessel segments are accessible. Bilateral testing is considered an integral part of a complete examination. Photoelectric Plethysmograph (PPG) waveforms and toe systolic  pressure readings are included as required and additional duplex testing as needed. Limited examinations for reoccurring indications may be performed as noted.  ABI Findings: +---------+------------------+-----+---------+--------+ Right    Rt Pressure (mmHg)IndexWaveform Comment  +---------+------------------+-----+---------+--------+ Brachial 137                                      +---------+------------------+-----+---------+--------+ ATA  220               1.61 triphasicNC       +---------+------------------+-----+---------+--------+ PTA      211               1.54 triphasicNC       +---------+------------------+-----+---------+--------+ Great Toe162               1.18 Normal            +---------+------------------+-----+---------+--------+ +--------+------------------+-----+--------+-------+ Left    Lt Pressure (mmHg)IndexWaveformComment +--------+------------------+-----+--------+-------+ [id number]                                    +--------+------------------+-----+--------+-------+ ATA                                    Lt BKA  +--------+------------------+-----+--------+-------+ +-------+-----------+-----------+------------+------------+ ABI/TBIToday's ABIToday's TBIPrevious ABIPrevious TBI +-------+-----------+-----------+------------+------------+ Right  >1.0     1.18                                +-------+-----------+-----------+------------+------------+  Summary: Right: Resting right ankle-brachial index indicates noncompressible right lower extremity arteries. The right toe-brachial index is normal. Left: Left BKA.  *See table(s) above for measurements and observations.  Electronically signed by Leotis Pain MD on 09/09/2021 at 9:37:30 AM.    Final        Assessment & Plan:   1. 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  Currently no role for intervention.  We will have patient follow-up with [place] on an as-needed basis or if she develops claudication-like symptoms or any open wounds or ulcerations. - VAS [place] ABI WITH/WO TBI  2. Hx of BKA, right ([place]) Currently patient is doing well with her progression with her prosthetic.  Patient will follow-up as needed if any issues arise.  3. Diabetes mellitus due to underlying condition with diabetic nephropathy, unspecified whether long term insulin use ([place]) Continue hypoglycemic medications as already ordered, these medications have been reviewed and there are no changes at this time.  Hgb A1C to be monitored as already arranged by primary service    Current Outpatient Medications on File Prior to Visit  Medication Sig Dispense Refill   amLODipine (NORVASC) 5 MG tablet Take 5 mg by mouth daily.     atorvastatin (LIPITOR) 40 MG tablet TAKE 1 TABLET BY MOUTH EVERY DAY 90 tablet 0   glipiZIDE (GLUCOTROL) 10 MG tablet Take 10 mg by mouth daily before breakfast.     Multiple Vitamins-Minerals (WOMENS MULTIVITAMIN PO) Take 1 tablet by mouth daily.      torsemide (DEMADEX) 10 MG tablet Take 1 tablet (10 mg total) by mouth daily. (Patient taking differently: Take 20 mg by mouth daily.) 30 tablet 0   TRULICITY 1.5 WU/1.3KG SOPN Inject 0.5 mLs into the skin once a week.     Lancets 30G MISC 1 each by Does not apply route 2 (two) times daily. 200 each 1   ONETOUCH ULTRA test strip 1 EACH BY OTHER ROUTE 2 (TWO) TIMES DAILY. 200 strip 1   pantoprazole (PROTONIX) 40 MG tablet Take 1 tablet (40 mg total) by mouth 2 (two) times daily. 30 tablet 0  SANTYL ointment Apply 1 application topically daily as needed.     No current facility-administered medications on file prior to visit.    There are no Patient Instructions on file for this visit. No follow-ups on file.   Kris Hartmann, NP

## 2021-09-20 ENCOUNTER — Encounter (INDEPENDENT_AMBULATORY_CARE_PROVIDER_SITE_OTHER): Payer: Self-pay | Admitting: Vascular Surgery

## 2021-09-26 DIAGNOSIS — R531 Weakness: Secondary | ICD-10-CM | POA: Diagnosis not present

## 2021-09-26 DIAGNOSIS — M869 Osteomyelitis, unspecified: Secondary | ICD-10-CM | POA: Diagnosis not present

## 2021-09-28 ENCOUNTER — Ambulatory Visit (INDEPENDENT_AMBULATORY_CARE_PROVIDER_SITE_OTHER): Payer: BC Managed Care – PPO | Admitting: Vascular Surgery

## 2021-10-01 ENCOUNTER — Other Ambulatory Visit (INDEPENDENT_AMBULATORY_CARE_PROVIDER_SITE_OTHER): Payer: Self-pay | Admitting: Nurse Practitioner

## 2021-10-01 ENCOUNTER — Encounter (INDEPENDENT_AMBULATORY_CARE_PROVIDER_SITE_OTHER): Payer: Self-pay | Admitting: Vascular Surgery

## 2021-10-01 ENCOUNTER — Ambulatory Visit (INDEPENDENT_AMBULATORY_CARE_PROVIDER_SITE_OTHER): Payer: BC Managed Care – PPO | Admitting: Nurse Practitioner

## 2021-10-01 VITALS — BP 137/88 | HR 97 | Resp 16 | Wt 201.0 lb

## 2021-10-01 DIAGNOSIS — Z89511 Acquired absence of right leg below knee: Secondary | ICD-10-CM

## 2021-10-05 LAB — AEROBIC CULTURE

## 2021-10-15 ENCOUNTER — Encounter (INDEPENDENT_AMBULATORY_CARE_PROVIDER_SITE_OTHER): Payer: Self-pay | Admitting: Nurse Practitioner

## 2021-10-15 ENCOUNTER — Ambulatory Visit (INDEPENDENT_AMBULATORY_CARE_PROVIDER_SITE_OTHER): Payer: BC Managed Care – PPO | Admitting: Vascular Surgery

## 2021-10-15 VITALS — BP 105/70 | HR 95 | Resp 16 | Wt 210.6 lb

## 2021-10-15 DIAGNOSIS — E782 Mixed hyperlipidemia: Secondary | ICD-10-CM

## 2021-10-15 DIAGNOSIS — Z89511 Acquired absence of right leg below knee: Secondary | ICD-10-CM

## 2021-10-15 DIAGNOSIS — I10 Essential (primary) hypertension: Secondary | ICD-10-CM

## 2021-10-15 DIAGNOSIS — E0821 Diabetes mellitus due to underlying condition with diabetic nephropathy: Secondary | ICD-10-CM | POA: Diagnosis not present

## 2021-10-15 NOTE — Progress Notes (Signed)
Foll

## 2021-10-18 ENCOUNTER — Encounter (INDEPENDENT_AMBULATORY_CARE_PROVIDER_SITE_OTHER): Payer: Self-pay | Admitting: Nurse Practitioner

## 2021-10-18 ENCOUNTER — Encounter (INDEPENDENT_AMBULATORY_CARE_PROVIDER_SITE_OTHER): Payer: Self-pay | Admitting: Vascular Surgery

## 2021-10-18 NOTE — Progress Notes (Signed)
MRN : [id number]  Rebecca Park is a 61 y.o. (September 22, 1960) female who presents with chief complaint of check BKA stump.  History of Present Illness:   The patient returns today for evaluation of her stump following development of a wound that appeared suddenly.  Today the wound site is essentially healed.  There is no more drainage.  No obvious signs symptoms of infection currently.  Current Meds  Medication Sig   amLODipine (NORVASC) 5 MG tablet Take 5 mg by mouth daily.   atorvastatin (LIPITOR) 40 MG tablet TAKE 1 TABLET BY MOUTH EVERY DAY   glipiZIDE (GLUCOTROL) 10 MG tablet Take 10 mg by mouth daily before breakfast.   Multiple Vitamins-Minerals (WOMENS MULTIVITAMIN PO) Take 1 tablet by mouth daily.    torsemide (DEMADEX) 10 MG tablet Take 1 tablet (10 mg total) by mouth daily. (Patient taking differently: Take 20 mg by mouth daily.)   TRULICITY 1.5 BV/6.9IH SOPN Inject 0.5 mLs into the skin once a week.    Past Medical History:  Diagnosis Date   Diabetes mellitus without complication (HCC)    Hypertension    PONV (postoperative nausea and vomiting)    the day after rt 5th toe amputation surgery    Past Surgical History:  Procedure Laterality Date   AMPUTATION Right 11/10/2018   Procedure: AMPUTATION RAY (amputation 5th metatarsal Right foot;  Surgeon: Caroline More, DPM;  Location: [facility];  Service: Podiatry;  Laterality: Right;   AMPUTATION Left 03/15/2021   Procedure: AMPUTATION BELOW KNEE;  Surgeon: Zara Chess, MD;  Location: [facility];  Service: Vascular;  Laterality: Left;   AMPUTATION TOE Right 12/05/2018   Procedure: RAY RIGHT 4TH AMPUTATION;  Surgeon: Caroline More, DPM;  Location: [place];  Service: Podiatry;  Laterality: Right;  mac with local DIABETIC-oral meds   BONE BIOPSY Right 11/10/2018   Procedure: BONE BIOPSY- Right 4th toe;  Surgeon: Caroline More, DPM;  Location: [facility];  Service: Podiatry;  Laterality: Right;    BUNIONECTOMY Bilateral    COLONOSCOPY WITH PROPOFOL N/A 09/07/2018   Procedure: COLONOSCOPY WITH PROPOFOL;  Surgeon: Lin Landsman, MD;  Location: [facility] ENDOSCOPY;  Service: Gastroenterology;  Laterality: N/A;   COLONOSCOPY WITH PROPOFOL N/A 09/17/2019   Procedure: COLONOSCOPY WITH PROPOFOL;  Surgeon: Lin Landsman, MD;  Location: [facility] ENDOSCOPY;  Service: Gastroenterology;  Laterality: N/A;   TEE WITHOUT CARDIOVERSION N/A 03/18/2021   Procedure: TRANSESOPHAGEAL ECHOCARDIOGRAM (TEE);  Surgeon: Minna Merritts, MD;  Location: [facility];  Service: Cardiovascular;  Laterality: N/A;    Social History Social History   Tobacco Use   Smoking status: Never   Smokeless tobacco: Never  Vaping Use   Vaping Use: Never used  Substance Use Topics   Alcohol use: Not Currently    Comment: recently quit 01/2018, 3-4 beers QD   Drug use: Never    Family History Family History  Problem Relation Age of Onset   Heart disease Father    Heart attack Father    Heart disease Maternal Grandfather    Breast cancer Neg Hx     No Known Allergies   REVIEW OF SYSTEMS (Negative unless checked)  Constitutional: _0 Weight loss  _1 Fever  _2 Chills Cardiac: _3 Chest pain   _4 Chest pressure   _5 Palpitations   _6 Shortness of breath when laying flat   _7 Shortness of breath with exertion. Vascular:  _8 Pain in legs with walking   _9 Pain in legs  at rest  _0 History of DVT   _1 Phlebitis   _2 Swelling in legs   _3 Varicose veins   _4 Non-healing ulcers Pulmonary:   _5 Uses home oxygen   _6 Productive cough   _7 Hemoptysis   _8 Wheeze  _9 COPD   _10 Asthma Neurologic:  _11 Dizziness   _12 Seizures   _13 History of stroke   _14 History of TIA  _15 Aphasia   _16 Vissual changes   _17 Weakness or numbness in arm   _18 Weakness or numbness in leg Musculoskeletal:   _19 Joint swelling   _20 Joint pain   _21 Low back pain Hematologic:  _22 Easy bruising  _23 Easy bleeding   _24 Hypercoagulable state   _25 Anemic Gastrointestinal:  _26 Diarrhea   _27 Vomiting   _28 Gastroesophageal reflux/heartburn   _29 Difficulty swallowing. Genitourinary:  _30 Chronic kidney disease   _31 Difficult urination  _32 Frequent urination   _33 Blood in urine Skin:  _34 Rashes   _35 Ulcers  Psychological:  _36 History of anxiety   _37  History of major depression.  Physical Examination  Vitals:   10/15/21 1135  BP: 105/70  Pulse: 95  Resp: 16  Weight: 210 lb 9.6 oz (95.5 kg)   Body mass index is 33.99 kg/m. Gen: WD/WN, NAD Head: [place]/AT, No temporalis wasting.  Ear/Nose/Throat: Hearing grossly intact, nares w/o erythema or drainage Eyes: PER, EOMI, sclera nonicteric.  Neck: Supple, no masses.  No bruit or JVD.  Pulmonary:  Good air movement, no audible wheezing, no use of accessory muscles.  Cardiac: RRR, normal S1, S2, no Murmurs. Vascular:  Stump is healed Vessel Right Left  Radial Palpable Palpable  PT BKA Palpable  DP BKA Palpable  Gastrointestinal: soft, non-distended. No guarding/no peritoneal signs.  Musculoskeletal: M/S 5/5 throughout.  No visible deformity.  Neurologic: CN 2-12 intact. Pain and light touch intact in extremities.  Symmetrical.  Speech is fluent. Motor exam as listed above. Psychiatric: Judgment intact, Mood & affect appropriate for pt's clinical situation. Dermatologic: No rashes or ulcers noted.  No changes consistent with cellulitis.   CBC Lab Results  Component Value Date   WBC 13.1 (H) 03/22/2021   HGB 9.1 (L) 03/22/2021   HCT 29.1 (L) 03/22/2021   MCV 85.1 03/22/2021   PLT 201 03/22/2021    BMET    Component Value Date/Time   NA 137 03/27/2021 0605   K 3.5 03/27/2021 0605   CL 97 (L) 03/27/2021 0605   CO2 32 03/27/2021 0605   GLUCOSE 122 (H) 03/27/2021 0605   BUN 10 03/27/2021 0605   CREATININE 0.74 03/27/2021 0605   CREATININE 0.93 09/15/2018 1545   CALCIUM 8.0 (L) 03/27/2021 0605   GFRNONAA >60 03/27/2021 0605   GFRAA >60 11/11/2018 0356   CrCl cannot be calculated (Patient's most recent lab result is older than the maximum  21 days allowed.).  COAG Lab Results  Component Value Date   INR 1.2 03/14/2021   INR 1.1 03/13/2021    Radiology No results found.   Assessment/Plan 1. Hx of BKA, right ([place]) The patient's wound appears healed.  We discussed a couple of ways to off load this site to prevent re-ulceration.  She will keep her next scheduled appointment.  2. Mixed hyperlipidemia Continue statin as ordered and reviewed, no changes at this time   3. Diabetes mellitus due to underlying condition with diabetic nephropathy, unspecified whether long term insulin use ([place]) Continue hypoglycemic medications as already ordered, these medications have been reviewed and there are no changes at this time.  Hgb A1C to be monitored as already arranged by primary service   4. Essential hypertension Continue antihypertensive medications  as already ordered, these medications have been reviewed and there are no changes at this time.     Hortencia Pilar, MD  10/18/2021 11:07 AM

## 2021-10-18 NOTE — Progress Notes (Signed)
Subjective:    Patient ID: Rebecca Park, female    DOB: 17-Apr-1960, 61 y.o.   MRN: [id number] Chief Complaint  Patient presents with   Follow-up    2 week follow up    The patient returns today for evaluation of her stump following development of a wound that appeared suddenly.  Today the wound site is much healed with just a small pinpoint opening.  There is to remain some drainage.  No obvious signs symptoms of infection currently.    Review of Systems  Skin:  Positive for wound.  All other systems reviewed and are negative.      Objective:   Physical Exam Vitals reviewed.  HENT:     Head: Normocephalic.  Cardiovascular:     Rate and Rhythm: Normal rate.  Pulmonary:     Effort: Pulmonary effort is normal.  Musculoskeletal:     Right Lower Extremity: Right leg is amputated below knee.  Skin:    General: Skin is warm and dry.  Neurological:     Mental Status: She is alert and oriented to person, place, and time.  Psychiatric:        Mood and Affect: Mood normal.        Behavior: Behavior normal.        Thought Content: Thought content normal.        Judgment: Judgment normal.     BP 137/88 (BP Location: Left Arm)   Pulse 97   Resp 16   Wt 201 lb (91.2 kg)   BMI 32.44 kg/m   Past Medical History:  Diagnosis Date   Diabetes mellitus without complication (HCC)    Hypertension    PONV (postoperative nausea and vomiting)    the day after rt 5th toe amputation surgery    Social History   Socioeconomic History   Marital status: Married    Spouse name: Not on file   Number of children: Not on file   Years of education: Not on file   Highest education level: Not on file  Occupational History   Not on file  Tobacco Use   Smoking status: Never   Smokeless tobacco: Never  Vaping Use   Vaping Use: Never used  Substance and Sexual Activity   Alcohol use: Not Currently    Comment: recently quit 01/2018, 3-4 beers QD   Drug use: Never   Sexual activity:  Yes  Other Topics Concern   Not on file  Social History Narrative   Not on file   Social Determinants of Health   Financial Resource Strain: Not on file  Food Insecurity: Not on file  Transportation Needs: Not on file  Physical Activity: Not on file  Stress: Not on file  Social Connections: Not on file  Intimate Partner Violence: Not on file    Past Surgical History:  Procedure Laterality Date   AMPUTATION Right 11/10/2018   Procedure: AMPUTATION RAY (amputation 5th metatarsal Right foot;  Surgeon: Caroline More, DPM;  Location: [facility];  Service: Podiatry;  Laterality: Right;   AMPUTATION Left 03/15/2021   Procedure: AMPUTATION BELOW KNEE;  Surgeon: Zara Chess, MD;  Location: [facility];  Service: Vascular;  Laterality: Left;   AMPUTATION TOE Right 12/05/2018   Procedure: RAY RIGHT 4TH AMPUTATION;  Surgeon: Caroline More, DPM;  Location: [place];  Service: Podiatry;  Laterality: Right;  mac with local DIABETIC-oral meds   BONE BIOPSY Right 11/10/2018   Procedure: BONE BIOPSY- Right 4th toe;  Surgeon:  Caroline More, DPM;  Location: [facility];  Service: Podiatry;  Laterality: Right;   BUNIONECTOMY Bilateral    COLONOSCOPY WITH PROPOFOL N/A 09/07/2018   Procedure: COLONOSCOPY WITH PROPOFOL;  Surgeon: Lin Landsman, MD;  Location: [facility] ENDOSCOPY;  Service: Gastroenterology;  Laterality: N/A;   COLONOSCOPY WITH PROPOFOL N/A 09/17/2019   Procedure: COLONOSCOPY WITH PROPOFOL;  Surgeon: Lin Landsman, MD;  Location: [facility] ENDOSCOPY;  Service: Gastroenterology;  Laterality: N/A;   TEE WITHOUT CARDIOVERSION N/A 03/18/2021   Procedure: TRANSESOPHAGEAL ECHOCARDIOGRAM (TEE);  Surgeon: Minna Merritts, MD;  Location: [facility];  Service: Cardiovascular;  Laterality: N/A;    Family History  Problem Relation Age of Onset   Heart disease Father    Heart attack Father    Heart disease Maternal Grandfather    Breast cancer Neg Hx     No Known Allergies     Latest Ref  Rng & Units 03/22/2021    5:08 AM 03/21/2021    5:35 AM 03/17/2021    4:29 AM  CBC  WBC 4.0 - 10.5 K/uL 13.1  11.9  16.2   Hemoglobin 12.0 - 15.0 g/dL 9.1  8.8  9.5   Hematocrit 36.0 - 46.0 % 29.1  27.7  29.8   Platelets 150 - 400 K/uL 201  177  179       CMP     Component Value Date/Time   NA 137 03/27/2021 0605   K 3.5 03/27/2021 0605   CL 97 (L) 03/27/2021 0605   CO2 32 03/27/2021 0605   GLUCOSE 122 (H) 03/27/2021 0605   BUN 10 03/27/2021 0605   CREATININE 0.74 03/27/2021 0605   CREATININE 0.93 09/15/2018 1545   CALCIUM 8.0 (L) 03/27/2021 0605   PROT 5.9 (L) 03/15/2021 0248   ALBUMIN 2.2 (L) 03/15/2021 0248   AST 21 03/15/2021 0248   ALT 18 03/15/2021 0248   ALKPHOS 148 (H) 03/15/2021 0248   BILITOT 1.6 (H) 03/15/2021 0248   GFRNONAA >60 03/27/2021 0605   GFRAA >60 11/11/2018 0356     VAS [place] ABI WITH/WO TBI  Result Date: 09/09/2021  LOWER EXTREMITY DOPPLER STUDY Patient Name:  Rebecca Park  Date of Exam:   09/02/2021 Medical Rec #: [id number]       Accession #:    [id number] Date of Birth: 04-10-1960        Patient Gender: F Patient Age:   83 years Exam Location:  [facility] Procedure:      VAS [place] ABI WITH/WO TBI Referring Phys: Leotis Pain --------------------------------------------------------------------------------  Indications: Peripheral artery disease.  Vascular Interventions: 03/15/2021: Left BKA. Performing Technologist: Almira Coaster RVS  Examination Guidelines: A complete evaluation includes at minimum, Doppler waveform signals and systolic blood pressure reading at the level of bilateral brachial, anterior tibial, and posterior tibial arteries, when vessel segments are accessible. Bilateral testing is considered an integral part of a complete examination. Photoelectric Plethysmograph (PPG) waveforms and toe systolic pressure readings are included as required and additional duplex testing as needed. Limited examinations for reoccurring indications may  be performed as noted.  ABI Findings: +---------+------------------+-----+---------+--------+ Right    Rt Pressure (mmHg)IndexWaveform Comment  +---------+------------------+-----+---------+--------+ Brachial 137                                      +---------+------------------+-----+---------+--------+ ATA      220  1.61 triphasicNC       +---------+------------------+-----+---------+--------+ PTA      211               1.54 triphasicNC       +---------+------------------+-----+---------+--------+ Great Toe162               1.18 Normal            +---------+------------------+-----+---------+--------+ +--------+------------------+-----+--------+-------+ Left    Lt Pressure (mmHg)IndexWaveformComment +--------+------------------+-----+--------+-------+ [id number]                                    +--------+------------------+-----+--------+-------+ ATA                                    Lt BKA  +--------+------------------+-----+--------+-------+ +-------+-----------+-----------+------------+------------+ ABI/TBIToday's ABIToday's TBIPrevious ABIPrevious TBI +-------+-----------+-----------+------------+------------+ Right  >1.0 [place]    1.18                                +-------+-----------+-----------+------------+------------+  Summary: Right: Resting right ankle-brachial index indicates noncompressible right lower extremity arteries. The right toe-brachial index is normal. Left: Left BKA.  *See table(s) above for measurements and observations.  Electronically signed by Leotis Pain MD on 09/09/2021 at 9:37:30 AM.    Final        Assessment & Plan:   1. Hx of BKA, right ([place]) The patient's wound is healed considerably.  She has not been utilizing her prosthetic.  We will culture the area to make sure there is no underlying infection.  Is advised to begin utilizing her prosthetic at short intervals.  We will have her return in 2 weeks  for wound evaluation.   Current Outpatient Medications on File Prior to Visit  Medication Sig Dispense Refill   amLODipine (NORVASC) 5 MG tablet Take 5 mg by mouth daily.     atorvastatin (LIPITOR) 40 MG tablet TAKE 1 TABLET BY MOUTH EVERY DAY 90 tablet 0   glipiZIDE (GLUCOTROL) 10 MG tablet Take 10 mg by mouth daily before breakfast.     Multiple Vitamins-Minerals (WOMENS MULTIVITAMIN PO) Take 1 tablet by mouth daily.      torsemide (DEMADEX) 10 MG tablet Take 1 tablet (10 mg total) by mouth daily. (Patient taking differently: Take 20 mg by mouth daily.) 30 tablet 0   TRULICITY 1.5 ID/4.3BD SOPN Inject 0.5 mLs into the skin once a week.     Lancets 30G MISC 1 each by Does not apply route 2 (two) times daily. 200 each 1   ONETOUCH ULTRA test strip 1 EACH BY OTHER ROUTE 2 (TWO) TIMES DAILY. 200 strip 1   pantoprazole (PROTONIX) 40 MG tablet Take 1 tablet (40 mg total) by mouth 2 (two) times daily. 30 tablet 0   SANTYL ointment Apply 1 application topically daily as needed.     No current facility-administered medications on file prior to visit.    There are no Patient Instructions on file for this visit. No follow-ups on file.   Kris Hartmann, NP

## 2021-10-23 DIAGNOSIS — I890 Lymphedema, not elsewhere classified: Secondary | ICD-10-CM | POA: Diagnosis not present

## 2021-10-27 DIAGNOSIS — R531 Weakness: Secondary | ICD-10-CM | POA: Diagnosis not present

## 2021-10-27 DIAGNOSIS — M869 Osteomyelitis, unspecified: Secondary | ICD-10-CM | POA: Diagnosis not present

## 2021-10-28 ENCOUNTER — Ambulatory Visit
Admission: RE | Admit: 2021-10-28 | Discharge: 2021-10-28 | Disposition: A | Discharge status: Home / Self Care | Payer: BC Managed Care – PPO | Source: Ambulatory Visit | Attending: Physician Assistant | Admitting: Physician Assistant

## 2021-10-28 DIAGNOSIS — Z1231 Encounter for screening mammogram for malignant neoplasm of breast: Secondary | ICD-10-CM | POA: Insufficient documentation

## 2021-11-02 DIAGNOSIS — E113513 Type 2 diabetes mellitus with proliferative diabetic retinopathy with macular edema, bilateral: Secondary | ICD-10-CM | POA: Diagnosis not present

## 2021-11-23 DIAGNOSIS — I890 Lymphedema, not elsewhere classified: Secondary | ICD-10-CM | POA: Diagnosis not present

## 2021-11-24 DIAGNOSIS — E113513 Type 2 diabetes mellitus with proliferative diabetic retinopathy with macular edema, bilateral: Secondary | ICD-10-CM | POA: Diagnosis not present

## 2021-11-27 DIAGNOSIS — R531 Weakness: Secondary | ICD-10-CM | POA: Diagnosis not present

## 2021-11-27 DIAGNOSIS — M869 Osteomyelitis, unspecified: Secondary | ICD-10-CM | POA: Diagnosis not present

## 2021-12-21 DIAGNOSIS — E119 Type 2 diabetes mellitus without complications: Secondary | ICD-10-CM | POA: Diagnosis not present

## 2021-12-24 DIAGNOSIS — I890 Lymphedema, not elsewhere classified: Secondary | ICD-10-CM | POA: Diagnosis not present

## 2021-12-27 DIAGNOSIS — R531 Weakness: Secondary | ICD-10-CM | POA: Diagnosis not present

## 2021-12-27 DIAGNOSIS — M869 Osteomyelitis, unspecified: Secondary | ICD-10-CM | POA: Diagnosis not present

## 2021-12-28 DIAGNOSIS — E119 Type 2 diabetes mellitus without complications: Secondary | ICD-10-CM | POA: Diagnosis not present

## 2021-12-28 DIAGNOSIS — E782 Mixed hyperlipidemia: Secondary | ICD-10-CM | POA: Diagnosis not present

## 2021-12-28 DIAGNOSIS — Z23 Encounter for immunization: Secondary | ICD-10-CM | POA: Diagnosis not present

## 2021-12-28 DIAGNOSIS — I10 Essential (primary) hypertension: Secondary | ICD-10-CM | POA: Diagnosis not present

## 2022-01-25 ENCOUNTER — Encounter (INDEPENDENT_AMBULATORY_CARE_PROVIDER_SITE_OTHER): Payer: Self-pay

## 2022-01-27 DIAGNOSIS — R531 Weakness: Secondary | ICD-10-CM | POA: Diagnosis not present

## 2022-01-27 DIAGNOSIS — M869 Osteomyelitis, unspecified: Secondary | ICD-10-CM | POA: Diagnosis not present

## 2022-02-22 DIAGNOSIS — E113513 Type 2 diabetes mellitus with proliferative diabetic retinopathy with macular edema, bilateral: Secondary | ICD-10-CM | POA: Diagnosis not present

## 2022-02-26 DIAGNOSIS — M869 Osteomyelitis, unspecified: Secondary | ICD-10-CM | POA: Diagnosis not present

## 2022-02-26 DIAGNOSIS — R531 Weakness: Secondary | ICD-10-CM | POA: Diagnosis not present

## 2022-03-29 DIAGNOSIS — M869 Osteomyelitis, unspecified: Secondary | ICD-10-CM | POA: Diagnosis not present

## 2022-03-29 DIAGNOSIS — R531 Weakness: Secondary | ICD-10-CM | POA: Diagnosis not present

## 2022-04-06 DIAGNOSIS — E119 Type 2 diabetes mellitus without complications: Secondary | ICD-10-CM | POA: Diagnosis not present

## 2022-04-06 DIAGNOSIS — E782 Mixed hyperlipidemia: Secondary | ICD-10-CM | POA: Diagnosis not present

## 2022-04-06 DIAGNOSIS — I10 Essential (primary) hypertension: Secondary | ICD-10-CM | POA: Diagnosis not present

## 2022-04-13 DIAGNOSIS — I10 Essential (primary) hypertension: Secondary | ICD-10-CM | POA: Diagnosis not present

## 2022-04-13 DIAGNOSIS — E119 Type 2 diabetes mellitus without complications: Secondary | ICD-10-CM | POA: Diagnosis not present

## 2022-04-13 DIAGNOSIS — Z89512 Acquired absence of left leg below knee: Secondary | ICD-10-CM | POA: Diagnosis not present

## 2022-04-13 DIAGNOSIS — E782 Mixed hyperlipidemia: Secondary | ICD-10-CM | POA: Diagnosis not present

## 2022-04-29 DIAGNOSIS — M869 Osteomyelitis, unspecified: Secondary | ICD-10-CM | POA: Diagnosis not present

## 2022-04-29 DIAGNOSIS — R531 Weakness: Secondary | ICD-10-CM | POA: Diagnosis not present

## 2022-05-17 DIAGNOSIS — E113512 Type 2 diabetes mellitus with proliferative diabetic retinopathy with macular edema, left eye: Secondary | ICD-10-CM | POA: Diagnosis not present

## 2022-05-25 DIAGNOSIS — E782 Mixed hyperlipidemia: Secondary | ICD-10-CM | POA: Diagnosis not present

## 2022-05-25 DIAGNOSIS — I10 Essential (primary) hypertension: Secondary | ICD-10-CM | POA: Diagnosis not present

## 2022-05-25 DIAGNOSIS — E119 Type 2 diabetes mellitus without complications: Secondary | ICD-10-CM | POA: Diagnosis not present

## 2022-05-25 DIAGNOSIS — Z89512 Acquired absence of left leg below knee: Secondary | ICD-10-CM | POA: Diagnosis not present

## 2022-05-28 DIAGNOSIS — R531 Weakness: Secondary | ICD-10-CM | POA: Diagnosis not present

## 2022-05-28 DIAGNOSIS — M869 Osteomyelitis, unspecified: Secondary | ICD-10-CM | POA: Diagnosis not present

## 2022-05-31 ENCOUNTER — Ambulatory Visit
Admission: RE | Admit: 2022-05-31 | Discharge: 2022-05-31 | Disposition: A | Discharge status: Home / Self Care | Payer: BC Managed Care – PPO | Attending: Nurse Practitioner | Admitting: Nurse Practitioner

## 2022-05-31 ENCOUNTER — Other Ambulatory Visit (INDEPENDENT_AMBULATORY_CARE_PROVIDER_SITE_OTHER): Payer: Self-pay | Admitting: Nurse Practitioner

## 2022-05-31 ENCOUNTER — Encounter (INDEPENDENT_AMBULATORY_CARE_PROVIDER_SITE_OTHER): Payer: Self-pay | Admitting: Nurse Practitioner

## 2022-05-31 ENCOUNTER — Ambulatory Visit
Admission: RE | Admit: 2022-05-31 | Discharge: 2022-05-31 | Disposition: A | Discharge status: Home / Self Care | Payer: BC Managed Care – PPO | Source: Ambulatory Visit | Attending: Nurse Practitioner | Admitting: Nurse Practitioner

## 2022-05-31 ENCOUNTER — Ambulatory Visit (INDEPENDENT_AMBULATORY_CARE_PROVIDER_SITE_OTHER): Payer: BC Managed Care – PPO | Admitting: Nurse Practitioner

## 2022-05-31 ENCOUNTER — Telehealth (INDEPENDENT_AMBULATORY_CARE_PROVIDER_SITE_OTHER): Payer: Self-pay | Admitting: Vascular Surgery

## 2022-05-31 VITALS — BP 132/85 | HR 85 | Resp 18 | Ht 67.0 in | Wt 230.0 lb

## 2022-05-31 DIAGNOSIS — M7989 Other specified soft tissue disorders: Secondary | ICD-10-CM | POA: Diagnosis not present

## 2022-05-31 NOTE — Telephone Encounter (Signed)
Patient schedule for today

## 2022-05-31 NOTE — Telephone Encounter (Signed)
She can come in and see myself or Dr. Delana Meyer with no studies

## 2022-05-31 NOTE — Telephone Encounter (Signed)
Patient called and stated she is having some soreness and swelling at the spot of her amputation and wanted to know if she could be seen in the very near future.  Please advise.

## 2022-06-01 ENCOUNTER — Encounter (INDEPENDENT_AMBULATORY_CARE_PROVIDER_SITE_OTHER): Payer: Self-pay | Admitting: Nurse Practitioner

## 2022-06-01 NOTE — Progress Notes (Signed)
Subjective:    Patient ID: Rebecca Park, female    DOB: [birth year], 62 y.o.   MRN: [id number] Chief Complaint  Patient presents with   Follow-up    f/u with no studies for redness and swelling    Rebecca Park is a 62 year old female who presents today for evaluation of her left residual limb.  The patient noted that she began to have some swelling and drainage from the area.  She has previously had a similar incident but the area was not as large.  She notes that she has been wearing her prosthetic is normal and denies any pain or discomfort with wearing it.  She has not worn it for any extended timeframe that she is aware of.  She denies any fevers or chills.    Review of Systems  Skin:  Positive for wound.  All other systems reviewed and are negative.      Objective:   Physical Exam Vitals reviewed.  HENT:     Head: Normocephalic.  Cardiovascular:     Rate and Rhythm: Normal rate.  Pulmonary:     Effort: Pulmonary effort is normal.  Musculoskeletal:     Left Lower Extremity: Left leg is amputated below knee.  Skin:    General: Skin is warm and dry.  Neurological:     Mental Status: She is alert and oriented to person, place, and time.  Psychiatric:        Mood and Affect: Mood normal.        Behavior: Behavior normal.        Thought Content: Thought content normal.        Judgment: Judgment normal.     BP 132/85 (BP Location: Left Arm)   Pulse 85   Resp 18   Ht '5\' 7"'$  (1.702 m)   Wt 230 lb (104.3 kg)   BMI 36.02 kg/m   Past Medical History:  Diagnosis Date   Diabetes mellitus without complication (HCC)    Hypertension    PONV (postoperative nausea and vomiting)    the day after rt 5th toe amputation surgery    Social History   Socioeconomic History   Marital status: Married    Spouse name: Not on file   Number of children: Not on file   Years of education: Not on file   Highest education level: Not on file  Occupational History   Not on file   Tobacco Use   Smoking status: Never   Smokeless tobacco: Never  Vaping Use   Vaping Use: Never used  Substance and Sexual Activity   Alcohol use: Not Currently    Comment: recently quit 01/2018, 3-4 beers QD   Drug use: Never   Sexual activity: Yes  Other Topics Concern   Not on file  Social History Narrative   Not on file   Social Determinants of Health   Financial Resource Strain: Not on file  Food Insecurity: Not on file  Transportation Needs: Not on file  Physical Activity: Not on file  Stress: Not on file  Social Connections: Not on file  Intimate Partner Violence: Not on file    Past Surgical History:  Procedure Laterality Date   AMPUTATION Right 11/10/2018   Procedure: AMPUTATION RAY (amputation 5th metatarsal Right foot;  Surgeon: Caroline More, DPM;  Location: [facility];  Service: Podiatry;  Laterality: Right;   AMPUTATION Left 03/15/2021   Procedure: AMPUTATION BELOW KNEE;  Surgeon: Zara Chess, MD;  Location: [facility];  Service:  Vascular;  Laterality: Left;   AMPUTATION TOE Right 12/05/2018   Procedure: RAY RIGHT 4TH AMPUTATION;  Surgeon: Caroline More, DPM;  Location: [place];  Service: Podiatry;  Laterality: Right;  mac with local DIABETIC-oral meds   BONE BIOPSY Right 11/10/2018   Procedure: BONE BIOPSY- Right 4th toe;  Surgeon: Caroline More, DPM;  Location: [facility];  Service: Podiatry;  Laterality: Right;   BUNIONECTOMY Bilateral    COLONOSCOPY WITH PROPOFOL N/A 09/07/2018   Procedure: COLONOSCOPY WITH PROPOFOL;  Surgeon: Lin Landsman, MD;  Location: [facility] ENDOSCOPY;  Service: Gastroenterology;  Laterality: N/A;   COLONOSCOPY WITH PROPOFOL N/A 09/17/2019   Procedure: COLONOSCOPY WITH PROPOFOL;  Surgeon: Lin Landsman, MD;  Location: [facility] ENDOSCOPY;  Service: Gastroenterology;  Laterality: N/A;   TEE WITHOUT CARDIOVERSION N/A 03/18/2021   Procedure: TRANSESOPHAGEAL ECHOCARDIOGRAM (TEE);  Surgeon: Minna Merritts, MD;  Location: [facility]  ORS;  Service: Cardiovascular;  Laterality: N/A;    Family History  Problem Relation Age of Onset   Heart disease Father    Heart attack Father    Heart disease Maternal Grandfather    Breast cancer Neg Hx     No Known Allergies     Latest Ref Rng & Units 03/22/2021    5:08 AM 03/21/2021    5:35 AM 03/17/2021    4:29 AM  CBC  WBC 4.0 - 10.5 K/uL 13.1  11.9  16.2   Hemoglobin 12.0 - 15.0 g/dL 9.1  8.8  9.5   Hematocrit 36.0 - 46.0 % 29.1  27.7  29.8   Platelets 150 - 400 K/uL 201  177  179       CMP     Component Value Date/Time   NA 137 03/27/2021 0605   K 3.5 03/27/2021 0605   CL 97 (L) 03/27/2021 0605   CO2 32 03/27/2021 0605   GLUCOSE 122 (H) 03/27/2021 0605   BUN 10 03/27/2021 0605   CREATININE 0.74 03/27/2021 0605   CREATININE 0.93 09/15/2018 1545   CALCIUM 8.0 (L) 03/27/2021 0605   PROT 5.9 (L) 03/15/2021 0248   ALBUMIN 2.2 (L) 03/15/2021 0248   AST 21 03/15/2021 0248   ALT 18 03/15/2021 0248   ALKPHOS 148 (H) 03/15/2021 0248   BILITOT 1.6 (H) 03/15/2021 0248   GFRNONAA >60 03/27/2021 0605   GFRAA >60 11/11/2018 0356     No results found.     Assessment & Plan:   1. Swelling of limb The patient does have noted swollen area on her left residual limb.  There is also some drainage from a small opening.  We will culture this and see if there is any evidence of active infection.  Will also have the patient undergo an x-ray to determine if this is more so edematous tissues or there is a possible underlying abscess.  I explained to the patient that depending on what is found with x-ray she actually may need an MRI to further evaluate the situation.  In the instance that there is nothing that is noted to be significant, the patient may also need reevaluation of her prosthetic as this may be ill feeding causing irritation. - DG Tibia/Fibula Left; Future   Current Outpatient Medications on File Prior to Visit  Medication Sig Dispense Refill   amLODipine  (NORVASC) 5 MG tablet Take 5 mg by mouth daily.     atorvastatin (LIPITOR) 40 MG tablet TAKE 1 TABLET BY MOUTH EVERY DAY 90 tablet 0   glipiZIDE (GLUCOTROL) 10 MG  tablet Take 10 mg by mouth daily before breakfast.     Lancets 30G MISC 1 each by Does not apply route 2 (two) times daily. 200 each 1   Multiple Vitamins-Minerals (WOMENS MULTIVITAMIN PO) Take 1 tablet by mouth daily.  (Patient not taking: Reported on 05/31/2022)     ONETOUCH ULTRA test strip 1 EACH BY OTHER ROUTE 2 (TWO) TIMES DAILY. 200 strip 1   pantoprazole (PROTONIX) 40 MG tablet Take 1 tablet (40 mg total) by mouth 2 (two) times daily. 30 tablet 0   SANTYL ointment Apply 1 application topically daily as needed.     torsemide (DEMADEX) 10 MG tablet Take 1 tablet (10 mg total) by mouth daily. (Patient not taking: Reported on 05/31/2022) 30 tablet 0   TRULICITY 1.5 0000000 SOPN Inject 0.5 mLs into the skin once a week. (Patient not taking: Reported on 05/31/2022)     No current facility-administered medications on file prior to visit.    There are no Patient Instructions on file for this visit. No follow-ups on file.   Kris Hartmann, NP

## 2022-06-04 ENCOUNTER — Other Ambulatory Visit (INDEPENDENT_AMBULATORY_CARE_PROVIDER_SITE_OTHER): Payer: Self-pay | Admitting: Nurse Practitioner

## 2022-06-04 ENCOUNTER — Encounter (INDEPENDENT_AMBULATORY_CARE_PROVIDER_SITE_OTHER): Payer: Self-pay

## 2022-06-04 LAB — AEROBIC CULTURE

## 2022-06-04 MED ORDER — SULFAMETHOXAZOLE-TRIMETHOPRIM 800-160 MG PO TABS: 1.0000 | ORAL_TABLET | Freq: Two times a day (BID) | ORAL | 0 refills | Status: DC

## 2022-06-28 DIAGNOSIS — M869 Osteomyelitis, unspecified: Secondary | ICD-10-CM | POA: Diagnosis not present

## 2022-06-28 DIAGNOSIS — R531 Weakness: Secondary | ICD-10-CM | POA: Diagnosis not present

## 2022-08-10 DIAGNOSIS — E113512 Type 2 diabetes mellitus with proliferative diabetic retinopathy with macular edema, left eye: Secondary | ICD-10-CM | POA: Diagnosis not present

## 2022-08-18 DIAGNOSIS — I10 Essential (primary) hypertension: Secondary | ICD-10-CM | POA: Diagnosis not present

## 2022-08-18 DIAGNOSIS — E782 Mixed hyperlipidemia: Secondary | ICD-10-CM | POA: Diagnosis not present

## 2022-08-18 DIAGNOSIS — R8290 Unspecified abnormal findings in urine: Secondary | ICD-10-CM | POA: Diagnosis not present

## 2022-08-18 DIAGNOSIS — E119 Type 2 diabetes mellitus without complications: Secondary | ICD-10-CM | POA: Diagnosis not present

## 2022-08-20 DIAGNOSIS — Z23 Encounter for immunization: Secondary | ICD-10-CM | POA: Diagnosis not present

## 2022-09-03 ENCOUNTER — Ambulatory Visit (INDEPENDENT_AMBULATORY_CARE_PROVIDER_SITE_OTHER): Payer: BC Managed Care – PPO | Admitting: Vascular Surgery

## 2022-09-03 ENCOUNTER — Encounter (INDEPENDENT_AMBULATORY_CARE_PROVIDER_SITE_OTHER): Payer: BC Managed Care – PPO

## 2022-09-14 ENCOUNTER — Other Ambulatory Visit (INDEPENDENT_AMBULATORY_CARE_PROVIDER_SITE_OTHER): Payer: Self-pay | Admitting: Nurse Practitioner

## 2022-09-14 DIAGNOSIS — Z89611 Acquired absence of right leg above knee: Secondary | ICD-10-CM

## 2022-09-14 DIAGNOSIS — I70201 Unspecified atherosclerosis of native arteries of extremities, right leg: Secondary | ICD-10-CM

## 2022-09-17 ENCOUNTER — Ambulatory Visit (INDEPENDENT_AMBULATORY_CARE_PROVIDER_SITE_OTHER): Payer: Self-pay

## 2022-09-17 ENCOUNTER — Ambulatory Visit (INDEPENDENT_AMBULATORY_CARE_PROVIDER_SITE_OTHER): Payer: BC Managed Care – PPO | Admitting: Vascular Surgery

## 2022-09-17 ENCOUNTER — Encounter (INDEPENDENT_AMBULATORY_CARE_PROVIDER_SITE_OTHER): Payer: Self-pay

## 2022-09-17 DIAGNOSIS — Z89611 Acquired absence of right leg above knee: Secondary | ICD-10-CM

## 2022-09-17 DIAGNOSIS — I70201 Unspecified atherosclerosis of native arteries of extremities, right leg: Secondary | ICD-10-CM

## 2022-09-21 LAB — VAS US ABI WITH/WO TBI

## 2022-10-04 ENCOUNTER — Other Ambulatory Visit: Payer: Self-pay | Admitting: Physician Assistant

## 2022-10-04 DIAGNOSIS — Z1231 Encounter for screening mammogram for malignant neoplasm of breast: Secondary | ICD-10-CM

## 2022-10-06 ENCOUNTER — Other Ambulatory Visit: Payer: Self-pay | Admitting: Physician Assistant

## 2022-10-06 DIAGNOSIS — R198 Other specified symptoms and signs involving the digestive system and abdomen: Secondary | ICD-10-CM

## 2022-10-11 ENCOUNTER — Ambulatory Visit
Admission: RE | Admit: 2022-10-11 | Discharge: 2022-10-11 | Disposition: A | Discharge status: Home / Self Care | Payer: PRIVATE HEALTH INSURANCE | Source: Ambulatory Visit | Attending: Physician Assistant | Admitting: Physician Assistant

## 2022-10-11 ENCOUNTER — Other Ambulatory Visit: Payer: Self-pay | Admitting: Physician Assistant

## 2022-10-11 DIAGNOSIS — R198 Other specified symptoms and signs involving the digestive system and abdomen: Secondary | ICD-10-CM | POA: Diagnosis present

## 2022-11-01 ENCOUNTER — Ambulatory Visit
Admission: RE | Admit: 2022-11-01 | Discharge: 2022-11-01 | Disposition: A | Discharge status: Home / Self Care | Payer: PRIVATE HEALTH INSURANCE | Source: Ambulatory Visit | Attending: Physician Assistant | Admitting: Physician Assistant

## 2022-11-01 DIAGNOSIS — Z1231 Encounter for screening mammogram for malignant neoplasm of breast: Secondary | ICD-10-CM | POA: Diagnosis present

## 2022-11-23 ENCOUNTER — Inpatient Hospital Stay: Payer: PRIVATE HEALTH INSURANCE

## 2022-11-23 ENCOUNTER — Encounter: Payer: Self-pay | Admitting: Emergency Medicine

## 2022-11-23 ENCOUNTER — Emergency Department: Payer: PRIVATE HEALTH INSURANCE

## 2022-11-23 ENCOUNTER — Inpatient Hospital Stay
Admission: EM | Admit: 2022-11-23 | Discharge: 2022-11-24 | DRG: 064 | Disposition: A | Discharge status: Home / Self Care | Payer: PRIVATE HEALTH INSURANCE | Attending: Hospitalist | Admitting: Hospitalist

## 2022-11-23 ENCOUNTER — Other Ambulatory Visit: Payer: Self-pay

## 2022-11-23 ENCOUNTER — Inpatient Hospital Stay (HOSPITAL_COMMUNITY)
Admit: 2022-11-23 | Discharge: 2022-11-23 | Disposition: A | Discharge status: Home / Self Care | Payer: PRIVATE HEALTH INSURANCE | Attending: Internal Medicine | Admitting: Internal Medicine

## 2022-11-23 DIAGNOSIS — Z89512 Acquired absence of left leg below knee: Secondary | ICD-10-CM | POA: Diagnosis not present

## 2022-11-23 DIAGNOSIS — Z7984 Long term (current) use of oral hypoglycemic drugs: Secondary | ICD-10-CM

## 2022-11-23 DIAGNOSIS — I110 Hypertensive heart disease with heart failure: Secondary | ICD-10-CM | POA: Diagnosis present

## 2022-11-23 DIAGNOSIS — I6389 Other cerebral infarction: Secondary | ICD-10-CM

## 2022-11-23 DIAGNOSIS — E119 Type 2 diabetes mellitus without complications: Secondary | ICD-10-CM | POA: Diagnosis present

## 2022-11-23 DIAGNOSIS — I639 Cerebral infarction, unspecified: Secondary | ICD-10-CM

## 2022-11-23 DIAGNOSIS — J449 Chronic obstructive pulmonary disease, unspecified: Secondary | ICD-10-CM | POA: Diagnosis present

## 2022-11-23 DIAGNOSIS — I2510 Atherosclerotic heart disease of native coronary artery without angina pectoris: Secondary | ICD-10-CM | POA: Diagnosis present

## 2022-11-23 DIAGNOSIS — Z79899 Other long term (current) drug therapy: Secondary | ICD-10-CM

## 2022-11-23 DIAGNOSIS — D72829 Elevated white blood cell count, unspecified: Secondary | ICD-10-CM | POA: Diagnosis present

## 2022-11-23 DIAGNOSIS — E669 Obesity, unspecified: Secondary | ICD-10-CM | POA: Diagnosis present

## 2022-11-23 DIAGNOSIS — E785 Hyperlipidemia, unspecified: Secondary | ICD-10-CM | POA: Diagnosis present

## 2022-11-23 DIAGNOSIS — Z7982 Long term (current) use of aspirin: Secondary | ICD-10-CM

## 2022-11-23 DIAGNOSIS — I5022 Chronic systolic (congestive) heart failure: Secondary | ICD-10-CM | POA: Diagnosis present

## 2022-11-23 DIAGNOSIS — Z89421 Acquired absence of other right toe(s): Secondary | ICD-10-CM

## 2022-11-23 DIAGNOSIS — R29702 NIHSS score 2: Secondary | ICD-10-CM | POA: Diagnosis present

## 2022-11-23 DIAGNOSIS — R4701 Aphasia: Secondary | ICD-10-CM | POA: Diagnosis present

## 2022-11-23 DIAGNOSIS — I10 Essential (primary) hypertension: Secondary | ICD-10-CM | POA: Diagnosis present

## 2022-11-23 DIAGNOSIS — E871 Hypo-osmolality and hyponatremia: Secondary | ICD-10-CM | POA: Diagnosis present

## 2022-11-23 DIAGNOSIS — Z7985 Long-term (current) use of injectable non-insulin antidiabetic drugs: Secondary | ICD-10-CM

## 2022-11-23 DIAGNOSIS — Z7902 Long term (current) use of antithrombotics/antiplatelets: Secondary | ICD-10-CM

## 2022-11-23 DIAGNOSIS — I6522 Occlusion and stenosis of left carotid artery: Secondary | ICD-10-CM | POA: Diagnosis present

## 2022-11-23 DIAGNOSIS — G936 Cerebral edema: Secondary | ICD-10-CM | POA: Diagnosis present

## 2022-11-23 DIAGNOSIS — Z8249 Family history of ischemic heart disease and other diseases of the circulatory system: Secondary | ICD-10-CM

## 2022-11-23 DIAGNOSIS — I63512 Cerebral infarction due to unspecified occlusion or stenosis of left middle cerebral artery: Principal | ICD-10-CM | POA: Diagnosis present

## 2022-11-23 HISTORY — DX: Cerebral infarction, unspecified: I63.9

## 2022-11-23 LAB — COMPREHENSIVE METABOLIC PANEL
ALT: 19 U/L (ref 0–44)
AST: 17 U/L (ref 15–41)
Albumin: 3.9 g/dL (ref 3.5–5.0)
Alkaline Phosphatase: 131 U/L — ABNORMAL HIGH (ref 38–126)
Anion gap: 10 (ref 5–15)
BUN: 37 mg/dL — ABNORMAL HIGH (ref 8–23)
CO2: 20 mmol/L — ABNORMAL LOW (ref 22–32)
Calcium: 8.5 mg/dL — ABNORMAL LOW (ref 8.9–10.3)
Chloride: 99 mmol/L (ref 98–111)
Creatinine, Ser: 1.26 mg/dL — ABNORMAL HIGH (ref 0.44–1.00)
GFR, Estimated: 48 mL/min — ABNORMAL LOW (ref >60)
Glucose, Bld: 159 mg/dL — ABNORMAL HIGH (ref 70–99)
Potassium: 3.9 mmol/L (ref 3.5–5.1)
Sodium: 129 mmol/L — ABNORMAL LOW (ref 135–145)
Total Bilirubin: 0.7 mg/dL (ref 0.3–1.2)
Total Protein: 7 g/dL (ref 6.5–8.1)

## 2022-11-23 LAB — URINE DRUG SCREEN, QUALITATIVE (ARMC ONLY)
Amphetamines, Ur Screen: NOT DETECTED
Barbiturates, Ur Screen: NOT DETECTED
Benzodiazepine, Ur Scrn: NOT DETECTED
Cannabinoid 50 Ng, Ur ~~LOC~~: NOT DETECTED
Cocaine Metabolite,Ur ~~LOC~~: NOT DETECTED
MDMA (Ecstasy)Ur Screen: NOT DETECTED
Methadone Scn, Ur: NOT DETECTED
Opiate, Ur Screen: NOT DETECTED
Phencyclidine (PCP) Ur S: NOT DETECTED
Tricyclic, Ur Screen: NOT DETECTED

## 2022-11-23 LAB — GLUCOSE, CAPILLARY
Glucose-Capillary: 209 mg/dL — ABNORMAL HIGH (ref 70–99)
Glucose-Capillary: 136 mg/dL — ABNORMAL HIGH (ref 70–99)
Glucose-Capillary: 135 mg/dL — ABNORMAL HIGH (ref 70–99)
Glucose-Capillary: 126 mg/dL — ABNORMAL HIGH (ref 70–99)

## 2022-11-23 LAB — URINALYSIS, ROUTINE W REFLEX MICROSCOPIC
Bilirubin Urine: NEGATIVE
Glucose, UA: NEGATIVE mg/dL
Ketones, ur: NEGATIVE mg/dL
Nitrite: NEGATIVE
Protein, ur: 100 mg/dL — AB
Specific Gravity, Urine: 1.025 (ref 1.005–1.030)
WBC, UA: >50 WBC/hpf (ref 0–5)
pH: 5 (ref 5.0–8.0)

## 2022-11-23 LAB — CBC
HCT: 37.8 % (ref 36.0–46.0)
Hemoglobin: 12.4 g/dL (ref 12.0–15.0)
MCH: 28.6 pg (ref 26.0–34.0)
MCHC: 32.8 g/dL (ref 30.0–36.0)
MCV: 87.3 fL (ref 80.0–100.0)
Platelets: 198 10*3/uL (ref 150–400)
RBC: 4.33 MIL/uL (ref 3.87–5.11)
RDW: 13.5 % (ref 11.5–15.5)
WBC: 12.5 10*3/uL — ABNORMAL HIGH (ref 4.0–10.5)
nRBC: 0 % (ref 0.0–0.2)

## 2022-11-23 LAB — DIFFERENTIAL
Abs Immature Granulocytes: 0.05 10*3/uL (ref 0.00–0.07)
Basophils Absolute: 0 10*3/uL (ref 0.0–0.1)
Basophils Relative: 0 %
Eosinophils Absolute: 0.2 10*3/uL (ref 0.0–0.5)
Eosinophils Relative: 2 %
Immature Granulocytes: 0 %
Lymphocytes Relative: 10 %
Lymphs Abs: 1.3 10*3/uL (ref 0.7–4.0)
Monocytes Absolute: 0.6 10*3/uL (ref 0.1–1.0)
Monocytes Relative: 5 %
Neutro Abs: 10.3 10*3/uL — ABNORMAL HIGH (ref 1.7–7.7)
Neutrophils Relative %: 83 %

## 2022-11-23 LAB — ECHOCARDIOGRAM COMPLETE
AR max vel: 1.7 cm2
AV Area VTI: 1.71 cm2
AV Area mean vel: 1.6 cm2
AV Mean grad: 5 mmHg
AV Peak grad: 9.4 mmHg
Ao pk vel: 1.53 m/s
Area-P 1/2: 7.16 cm2
Est EF: >55
Height: 67 in
S' Lateral: 3 cm
Weight: 3904.79 oz

## 2022-11-23 LAB — PROTIME-INR
INR: 1.1 (ref 0.8–1.2)
Prothrombin Time: 14 seconds (ref 11.4–15.2)

## 2022-11-23 LAB — HEMOGLOBIN A1C
Hgb A1c MFr Bld: 6.4 % — ABNORMAL HIGH (ref 4.8–5.6)
Mean Plasma Glucose: 136.98 mg/dL

## 2022-11-23 LAB — APTT: aPTT: 30 seconds (ref 24–36)

## 2022-11-23 LAB — CBG MONITORING, ED: Glucose-Capillary: 159 mg/dL — ABNORMAL HIGH (ref 70–99)

## 2022-11-23 LAB — ETHANOL: Alcohol, Ethyl (B): <10 mg/dL (ref <10)

## 2022-11-23 MED ORDER — ASPIRIN 81 MG PO CHEW
324.0000 mg | CHEWABLE_TABLET | Freq: Once | ORAL | Status: AC
  Administered 2022-11-23: 324 mg via ORAL
  Filled 2022-11-23: qty 4

## 2022-11-23 MED ORDER — CLOPIDOGREL BISULFATE 75 MG PO TABS
75.0000 mg | ORAL_TABLET | Freq: Every day | ORAL | Status: DC
  Administered 2022-11-24: 75 mg via ORAL
  Filled 2022-11-23: qty 1

## 2022-11-23 MED ORDER — ACETAMINOPHEN 160 MG/5ML PO SOLN: 650.0000 mg | ORAL | Status: DC | PRN

## 2022-11-23 MED ORDER — ATORVASTATIN CALCIUM 20 MG PO TABS
40.0000 mg | ORAL_TABLET | Freq: Every day | ORAL | Status: DC
  Administered 2022-11-23 – 2022-11-24 (×2): 40 mg via ORAL
  Filled 2022-11-23 (×2): qty 2

## 2022-11-23 MED ORDER — ACETAMINOPHEN 325 MG PO TABS: 650.0000 mg | ORAL_TABLET | ORAL | Status: DC | PRN

## 2022-11-23 MED ORDER — INSULIN ASPART 100 UNIT/ML IJ SOLN
0.0000 [IU] | Freq: Three times a day (TID) | INTRAMUSCULAR | Status: DC
  Administered 2022-11-23: 5 [IU] via SUBCUTANEOUS
  Administered 2022-11-23 – 2022-11-24 (×4): 2 [IU] via SUBCUTANEOUS
  Filled 2022-11-23 (×5): qty 1

## 2022-11-23 MED ORDER — METOPROLOL SUCCINATE ER 25 MG PO TB24
25.0000 mg | ORAL_TABLET | Freq: Every day | ORAL | Status: DC
  Administered 2022-11-23 – 2022-11-24 (×2): 25 mg via ORAL
  Filled 2022-11-23 (×2): qty 1

## 2022-11-23 MED ORDER — SENNOSIDES-DOCUSATE SODIUM 8.6-50 MG PO TABS: 1.0000 | ORAL_TABLET | Freq: Every evening | ORAL | Status: DC | PRN

## 2022-11-23 MED ORDER — ENOXAPARIN SODIUM 60 MG/0.6ML IJ SOSY
0.5000 mg/kg | PREFILLED_SYRINGE | INTRAMUSCULAR | Status: DC
  Administered 2022-11-23: 55 mg via SUBCUTANEOUS
  Filled 2022-11-23: qty 0.6

## 2022-11-23 MED ORDER — LORAZEPAM 2 MG/ML IJ SOLN
1.0000 mg | Freq: Once | INTRAMUSCULAR | Status: DC
  Filled 2022-11-23: qty 1

## 2022-11-23 MED ORDER — ASPIRIN 81 MG PO TBEC
81.0000 mg | DELAYED_RELEASE_TABLET | Freq: Every day | ORAL | Status: DC
  Administered 2022-11-24: 81 mg via ORAL
  Filled 2022-11-23: qty 1

## 2022-11-23 MED ORDER — SODIUM CHLORIDE 0.9 % IV SOLN: INTRAVENOUS | Status: AC

## 2022-11-23 MED ORDER — PANTOPRAZOLE SODIUM 40 MG PO TBEC
40.0000 mg | DELAYED_RELEASE_TABLET | Freq: Two times a day (BID) | ORAL | Status: DC
  Administered 2022-11-23 – 2022-11-24 (×3): 40 mg via ORAL
  Filled 2022-11-23 (×3): qty 1

## 2022-11-23 MED ORDER — IOHEXOL 350 MG/ML SOLN
100.0000 mL | Freq: Once | INTRAVENOUS | Status: AC | PRN
  Administered 2022-11-23: 100 mL via INTRAVENOUS

## 2022-11-23 MED ORDER — STROKE: EARLY STAGES OF RECOVERY BOOK: Freq: Once | Status: AC

## 2022-11-23 MED ORDER — ACETAMINOPHEN 650 MG RE SUPP: 650.0000 mg | RECTAL | Status: DC | PRN

## 2022-11-23 MED ORDER — LORAZEPAM 2 MG/ML IJ SOLN
1.0000 mg | Freq: Once | INTRAMUSCULAR | Status: AC
  Administered 2022-11-23: 1 mg via INTRAVENOUS

## 2022-11-23 MED ORDER — CLOPIDOGREL BISULFATE 75 MG PO TABS
300.0000 mg | ORAL_TABLET | Freq: Once | ORAL | Status: AC
  Administered 2022-11-23: 300 mg via ORAL
  Filled 2022-11-23: qty 4

## 2022-11-23 MED ORDER — HYDRALAZINE HCL 20 MG/ML IJ SOLN: 5.0000 mg | INTRAMUSCULAR | Status: DC | PRN

## 2022-11-23 NOTE — Progress Notes (Signed)
*  PRELIMINARY RESULTS* Echocardiogram 2D Echocardiogram has been performed.  Cristela Blue 11/23/2022, 10:14 AM

## 2022-11-23 NOTE — Evaluation (Signed)
Occupational Therapy Evaluation Patient Details Name: Rebecca Park MRN: [id number] DOB: 1960/04/25 Today's Date: 11/23/2022   History of Present Illness Patient is a 62 year old female with acute aphasia. CT of head with acute left MCA stroke. History of HTN, chronic HFrEF with LVEF 45-50%, IIDM ,HLD, left BKA   Clinical Impression   Upon entering the room, pt supine in bed and agreeable to OT intervention. Pt is pleasant and cooperative. She does have difficulty with word finding but using strategies such as describing what she is attempting to say. Pt reports living at home with spouse and being Mod I at baseline with use of SPC and prosthesis for mobility and working full time. She utilized using wheelchair when prosthesis is off in order to remain safe. Pt has donned her own prosthesis today and demonstrates ability to ambulates with SPC without assistance 100'. Pt also endorses ambulating several times back and forth to bathroom today. Pt does not need skilled OT intervention and agrees. OT to complete orders at this time as pt is at baseline for functional mobility and occupational performance.       If plan is discharge home, recommend the following:      Functional Status Assessment  Patient has not had a recent decline in their functional status  Equipment Recommendations  None recommended by OT       Precautions / Restrictions Precautions Precautions: Fall Restrictions Weight Bearing Restrictions: No      Mobility Bed Mobility Overal bed mobility: Modified Independent                  Transfers Overall transfer level: Modified independent Equipment used: Straight cane                      Balance Overall balance assessment: Needs assistance Sitting-balance support: Feet supported Sitting balance-Leahy Scale: Good     Standing balance support: Single extremity supported, During functional activity Standing balance-Leahy Scale: Fair                              ADL either performed or assessed with clinical judgement   ADL Overall ADL's : At baseline;Modified independent                                       General ADL Comments: mod I for all self care tasks and dons her own prosthesis today. She has been ambulating back and forth to bathroom with use of SPC and ambulates to RN station this session without physical assistance.     Vision Baseline Vision/History: 1 Wears glasses Patient Visual Report: No change from baseline              Pertinent Vitals/Pain Pain Assessment Pain Assessment: No/denies pain     Extremity/Trunk Assessment Upper Extremity Assessment Upper Extremity Assessment: Right hand dominant RUE Deficits / Details: 4/5 shoulder flexion RUE Sensation: WNL   Lower Extremity Assessment Lower Extremity Assessment: RLE deficits/detail;LLE deficits/detail RLE Deficits / Details: 5/5 dorsiflexion, plantarflexion. weight bearing without knee buckling RLE Sensation: history of peripheral neuropathy;WNL LLE Deficits / Details: BKA with prosthesis in place. patient able to advance hip with ambulation without difficulty LLE Sensation: history of peripheral neuropathy       Communication Communication Communication: Difficulty communicating thoughts/reduced clarity of speech   Cognition Arousal: Alert Behavior  During Therapy: WFL for tasks assessed/performed Overall Cognitive Status: Within Functional Limits for tasks assessed                                 General Comments: Pt is pleasant and motivated to return home                Home Living Family/patient expects to be discharged to:: Private residence Living Arrangements: Spouse/significant other Available Help at Discharge: Family;Available 24 hours/day Type of Home: House Home Access: Stairs to enter [employer] of Steps: 3 Entrance Stairs-Rails: Right;Left Home Layout: One level      Bathroom Shower/Tub: Walk-in shower         Home Equipment: Agricultural consultant (2 wheels);Cane - single point;Shower seat;Wheelchair - manual          Prior Functioning/Environment Prior Level of Function : Independent/Modified Independent;Working/employed             Mobility Comments: using prosthesis and cane for mobility and wheelchair when prosthesis is off ADLs Comments: Pt reports being Ind in self care tasks and still working full time.                 OT Goals(Current goals can be found in the care plan section) Acute Rehab OT Goals Patient Stated Goal: to go home and work on speech OT Goal Formulation: With patient Time For Goal Achievement: 11/23/22 Potential to Achieve Goals: Fair  OT Frequency:         AM-PAC OT "6 Clicks" Daily Activity     Outcome Measure Help from another person eating meals?: None Help from another person taking care of personal grooming?: None Help from another person toileting, which includes using toliet, bedpan, or urinal?: None Help from another person bathing (including washing, rinsing, drying)?: None Help from another person to put on and taking off regular upper body clothing?: None Help from another person to put on and taking off regular lower body clothing?: None 6 Click Score: 24   End of Session Equipment Utilized During Treatment: Other (comment) [facility]) Nurse Communication: Mobility status  Activity Tolerance: Patient tolerated treatment well Patient left: in bed;with call bell/phone within reach;with bed alarm set                   Time: 1610-9604 OT Time Calculation (min): 17 min Charges:  OT General Charges $OT Visit: 1 Visit OT Evaluation $OT Eval Low Complexity: 1 Low  Jackquline Denmark, MS, OTR/L , CBIS ascom [phone]  11/23/22, 3:26 PM

## 2022-11-23 NOTE — Consult Note (Signed)
Hospital Consult    Reason for Consult:  Acute MCA Infarct Requesting Physician:  Dr Mikey College MD  MRN #:  [id number]  History of Present Illness: This is a 62 y.o. female Rebecca Park is a 62 y.o. female with medical history significant of HTN, chronic HFrEF with LVEF 45-50%, IIDM ,HLD, left BKA, presented with acute aphasia. Patient was last known normal around 9:30 PM yesterday, early morning this morning, patient woke up and found " trouble to find my words" witnessed by her significant other at bedside.  She denies any weakness numbness of any of the limbs, no headache no double vision. CT head acute left MCA stroke, CTA showed left ICA 75% stenosis.  Patient is currently been placed on aspirin 81 mg daily, Plavix 75 mg daily, Lipitor 40 mg daily, and Lovenox 55 mg subcu every 24 hours while inpatient.  On exam this morning patient is resting comfortably in bed.  She endorses late last night she was having difficulty speaking and when her spouse checked on her at 2 AM she noticed a problem as well and called EMS.  She denies any current chest pain or shortness of breath.  She does endorse having difficulty recalling certain words but denies any left-sided or right-sided weakness.  She endorses she will be going for an MRI of the brain later this morning.  Past Medical History:  Diagnosis Date   Diabetes mellitus without complication (HCC)    Hypertension    PONV (postoperative nausea and vomiting)    the day after rt 5th toe amputation surgery    Past Surgical History:  Procedure Laterality Date   AMPUTATION Right 11/10/2018   Procedure: AMPUTATION RAY (amputation 5th metatarsal Right foot;  Surgeon: Rosetta Posner, DPM;  Location: [facility] ORS;  Service: Podiatry;  Laterality: Right;   AMPUTATION Left 03/15/2021   Procedure: AMPUTATION BELOW KNEE;  Surgeon: Learta Codding, MD;  Location: [facility] ORS;  Service: Vascular;  Laterality: Left;   AMPUTATION TOE Right 12/05/2018   Procedure: RAY  RIGHT 4TH AMPUTATION;  Surgeon: Rosetta Posner, DPM;  Location: [facility];  Service: Podiatry;  Laterality: Right;  mac with local DIABETIC-oral meds   BONE BIOPSY Right 11/10/2018   Procedure: BONE BIOPSY- Right 4th toe;  Surgeon: Rosetta Posner, DPM;  Location: [facility] ORS;  Service: Podiatry;  Laterality: Right;   BUNIONECTOMY Bilateral    COLONOSCOPY WITH PROPOFOL N/A 09/07/2018   Procedure: COLONOSCOPY WITH PROPOFOL;  Surgeon: Toney Reil, MD;  Location: [facility];  Service: Gastroenterology;  Laterality: N/A;   COLONOSCOPY WITH PROPOFOL N/A 09/17/2019   Procedure: COLONOSCOPY WITH PROPOFOL;  Surgeon: Toney Reil, MD;  Location: [facility] END[facility]COPY;  Service: Gastroenterology;  Laterality: N/A;   TEE WITHOUT CARDIOVERSION N/A 03/18/2021   Procedure: TRANSESOPHAGEAL ECHOCARDIOGRAM (TEE);  Surgeon: Antonieta Iba, MD;  Location: [facility] ORS;  Service: Cardiovascular;  Laterality: N/A;    No Known Allergies  Prior to Admission medications   Medication Sig Start Date End Date Taking? Authorizing Provider  amLODipine (NORVASC) 5 MG tablet Take 5 mg by mouth daily. 09/01/21  Yes [provider]  atorvastatin (LIPITOR) 40 MG tablet TAKE 1 TABLET BY MOUTH EVERY DAY 07/02/20  Yes Baity, Salvadore Oxford, NP  glipiZIDE (GLUCOTROL) 10 MG tablet Take 10 mg by mouth daily before breakfast.   Yes [provider]  losartan-hydrochlorothiazide (HYZAAR) 100-12.5 MG tablet Take 1 tablet by mouth daily. 08/31/22  Yes [provider]  metoprolol succinate (TOPROL-XL) 25 MG 24 hr  tablet Take 1 tablet by mouth daily. 09/16/22  Yes [provider]  MOUNJARO 7.5 MG/0.5ML Pen Inject 7.5 mg into the skin once a week. 09/17/22  Yes [provider]  pantoprazole (PROTONIX) 40 MG tablet Take 1 tablet (40 mg total) by mouth 2 (two) times daily. 03/27/21  Yes Azucena Fallen, MD  Lancets 30G MISC 1 each by Does not apply route 2 (two) times daily. 01/18/19   Lorre Munroe, NP  Multiple Vitamins-Minerals (WOMENS MULTIVITAMIN PO) Take 1 tablet by mouth daily.  Patient not taking: Reported on 05/31/2022    [provider]  ONETOUCH ULTRA test strip 1 EACH BY OTHER ROUTE 2 (TWO) TIMES DAILY. 07/11/19   Lorre Munroe, NP  SANTYL ointment Apply 1 application topically daily as needed. Patient not taking: Reported on 11/23/2022 01/19/21   [provider]  torsemide (DEMADEX) 10 MG tablet Take 1 tablet (10 mg total) by mouth daily. Patient not taking: Reported on 05/31/2022 03/28/21   Azucena Fallen, MD  TRULICITY 1.5 MG/0.5ML SOPN Inject 0.5 mLs into the skin once a week. Patient not taking: Reported on 05/31/2022 02/16/21   [provider]    Social History   Socioeconomic History   Marital status: Married    Spouse name: Not on file   Number of children: Not on file   Years of education: Not on file   Highest education level: Not on file  Occupational History   Not on file  Tobacco Use   Smoking status: Never   Smokeless tobacco: Never  Vaping Use   Vaping status: Never Used  Substance and Sexual Activity   Alcohol use: Not Currently    Comment: recently quit 01/2018, 3-4 beers QD   Drug use: Never   Sexual activity: Yes  Other Topics Concern   Not on file  Social History Narrative   Not on file   Social Determinants of Health   Financial Resource Strain: Low Risk  (10/05/2022)   [facility], [facility]   Overall Financial Resource Strain (CARDIA)    Difficulty of Paying Living Expenses: Not hard at all  Food Insecurity: No Food Insecurity (11/23/2022)   Hunger Vital Sign    Worried About Running Out of Food in the Last Year: Never true    Ran Out of Food in the Last Year: Never true  Transportation Needs: No Transportation Needs (11/23/2022)   PRAPARE - Administrator, Civil Service (Medical): No    Lack of Transportation (Non-Medical): No   Physical Activity: Not on file  Stress: Not on file  Social Connections: Not on file  Intimate Partner Violence: Not At Risk (11/23/2022)   Humiliation, Afraid, Rape, and Kick questionnaire    Fear of Current or Ex-Partner: No    Emotionally Abused: No    Physically Abused: No    Sexually Abused: No     Family History  Problem Relation Age of Onset   Heart disease Father    Heart attack Father    Heart disease Maternal Grandfather    Breast cancer Neg Hx     ROS: Otherwise negative unless mentioned in HPI  Physical Examination  Vitals:   11/23/22 0600 11/23/22 0747  BP: (!) 140/72 (!) 115/55  Pulse: 99 (!) 102  Resp: (!) 24 18  Temp:  97.9 F (36.6 C)  SpO2: 100% 95%   Body mass index is 38.22 kg/m.  General:  WDWN in NAD Gait: Not observed HENT: WNL, normocephalic Pulmonary: normal non-labored breathing, without Rales, rhonchi,  wheezing Cardiac: regular, without  Murmurs, rubs or gallops; without carotid bruits Abdomen: Positive Bowel Sounds throughout,  soft, NT/ND, no masses Skin: without rashes Vascular Exam/Pulses: Palpable Pulses throughout Extremities: without ischemic changes, without Gangrene , without cellulitis; without open wounds;  Musculoskeletal: no muscle wasting or atrophy  Neurologic: A&O X 3;  No focal weakness or paresthesias are detected; speech is fluent/normal Psychiatric:  The pt has Normal affect. Lymph:  Unremarkable  CBC    Component Value Date/Time   WBC 12.5 (H) 11/23/2022 0400   RBC 4.33 11/23/2022 0400   HGB 12.4 11/23/2022 0400   HCT 37.8 11/23/2022 0400   PLT 198 11/23/2022 0400   MCV 87.3 11/23/2022 0400   MCH 28.6 11/23/2022 0400   MCHC 32.8 11/23/2022 0400   RDW 13.5 11/23/2022 0400   LYMPHSABS 1.3 11/23/2022 0400   MONOABS 0.6 11/23/2022 0400   EOSABS 0.2 11/23/2022 0400   BASOSABS 0.0 11/23/2022 0400    BMET    Component Value Date/Time   NA 129 (L) 11/23/2022 0400   K 3.9 11/23/2022 0400   CL 99  11/23/2022 0400   CO2 20 (L) 11/23/2022 0400   GLUCOSE 159 (H) 11/23/2022 0400   BUN 37 (H) 11/23/2022 0400   CREATININE 1.26 (H) 11/23/2022 0400   CREATININE 0.93 09/15/2018 1545   CALCIUM 8.5 (L) 11/23/2022 0400   GFRNONAA 48 (L) 11/23/2022 0400   GFRAA >60 11/11/2018 0356    COAGS: Lab Results  Component Value Date   INR 1.1 11/23/2022   INR 1.2 03/14/2021   INR 1.1 03/13/2021     Non-Invasive Vascular Imaging:   EXAM:11/23/22 CT HEAD WITHOUT CONTRAST   TECHNIQUE: Contiguous axial images were obtained from the base of the skull through the vertex without intravenous contrast.   RADIATION DOSE REDUCTION: This exam was performed according to the departmental dose-optimization program which includes automated exposure control, adjustment of the mA and/or kV according to patient size and/or use of iterative reconstruction technique.   COMPARISON:  None Available.   FINDINGS: Brain: Cytotoxic edema in the upper left insula and lateral frontal cortex. No hemorrhage, hydrocephalus, or masslike finding   Vascular: No hyperdense vessel or unexpected calcification.   Skull: Normal. Negative for fracture or focal lesion.   Sinuses/Orbits: No acute finding.   Other: These results were called by telephone at the time of interpretation on 11/23/2022 at 4:06 am to provider [facility] , who verbally acknowledged these results.   ASPECTS [facility] Stroke Program Early CT Score)   - Ganglionic level infarction (caudate, lentiform nuclei, internal capsule, insula, M1-M3 cortex): 6   - Supraganglionic infarction (M4-M6 cortex): 2   Total score (0-10 with 10 being normal): 8   IMPRESSION: 1. Acute left MCA branch infarct.  ASPECTS is 8. 2. No intracranial hemorrhage.  Statin:  Yes.   Beta Blocker:  Yes.   Aspirin:  No. ACEI:  No. ARB:  Yes.   CCB use:  Yes Other antiplatelets/anticoagulants:  No.    ASSESSMENT/PLAN: This is a 62 y.o. female who presents to [facility]  emergency department with acute aphasia.  Upon workup patient was noted to have an acute CVA with infarct left MCA.  PLAN: After reviewing the patient's CTA of her head neck.  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  Due to the patient having an acute CVA with Broca's aphasia and  difficulty with word recall any intervention will have to be delayed for 2 weeks.  Therefore we will plan on scheduling the patient for an outpatient endovascular left carotid artery angioplasty and stent placement.  Patient will need to be discharged on aspirin 81 mg daily, Plavix 75 mg daily and Lipitor 40 mg daily.  I discussed in detail with the patient and her spouse the procedure, benefits, risks, and complications.  Both verbalized her understanding and would like to proceed in 2 weeks as planned.  I answered all her questions this afternoon.  Patient's information will be forwarded to the office for scheduling patient was instructed she will be called with the appointment.  -I discussed the plan in detail with Dr. Levora Dredge MD and he is in agreement with the plan.   Marcie Bal [facility] 11/23/2022 9:59 AM

## 2022-11-23 NOTE — Consult Note (Addendum)
Advanced Imaging:  CTA Head and Neck Completed. L ICA proximal left ICA stenosis measuring ~ 75%. Rec vascular surgery/neuro IR consult  consult for revascularization. Rec plavix load of 300 mg and ASA 81 mg now and then ASA 81 and plavix 75 starting tomorrow (if able to swallow and if not rec just ASA suppository).   CTP Completed. LVO:No  [facility] Tele[facility]urology Consult Services   Patient Name:   Rebecca, Park Date of Birth:   10/01/1950 Identification Number:   MRN - [id number] Date of Service:   11/23/2022 04:13:47  Diagnosis:       I63.89 - Cerebrovascular accident (CVA) due to other mechanism [facility])  Impression:      62 F presents with expressive aphasia. Not a thrombolytic candidate 2/2 LKN >4.5 hrs . HCT shows a likely already completed L MCA ischemic CVA. Not an IR candidate 2/2 NIH low (no severely newly debilitating symptoms and LVO not suspected), CTA pending. Rec admission for CVA workup. ASA 325 mg for now (or ASA rectally)  Our recommendations are outlined below.  Recommendations:        Stroke/Telemetry Floor       Neuro Checks       Bedside Swallow Eval       DVT Prophylaxis       IV Fluids, Normal Saline       Head of Bed 30 Degrees       Euglycemia and Avoid Hyperthermia (PRN Acetaminophen)       Initiate or continue Aspirin 325 MG daily       Antihypertensives PRN if Blood pressure is greater than 220/120 or there is a concern for End organ damage/contraindications for permissive HTN. If blood pressure is greater than 220/120 give labetalol PO or IV or Vasotec IV with a goal of 15% reduction in BP during the first 24 hours.       Stroke/TIA work up with: Brain MRI withOUT contrast, lipid panel (if not done in the past 30 days) and start high intensity statin (lipitor 40 or 80 mg or crestor 40 mg) depending upon high high above 70 LDL is to achieve a goal of < 70, HbA1c (if not done in the past 30 days), Transthoracic ECHO if not done in the  past 2 months (to evaluate for Ejection fraction, wall motion abnormalities, vegetations, thrombus (left ventricle or left atrial appendage), possible PFO (if there is a PFO then rec venous doppler of lower extremities and pelvic veins)).  Sign Out:       Discussed with Emergency Department Provider    ------------------------------------------------------------------------------  Advanced Imaging: Advanced imaging has been ordered. Results pending.   Metrics: Last Known Well: 11/22/2022 21:30:00 [facility] Notification Time: 11/23/2022 04:13:47 Arrival Time: 11/23/2022 03:45:00 Stamp Time: 11/23/2022 04:13:47 Initial Response Time: 11/23/2022 04:17:52 Symptoms: speech changes. Initial patient interaction: 11/23/2022 04:17:52 NIHSS Assessment Completed: 11/23/2022 04:23:00 Patient is not a candidate for Thrombolytic. Thrombolytic Medical Decision: 11/23/2022 04:23:00 Patient was not deemed candidate for Thrombolytic because of following reasons: Last Well Known Above 4.5 Hours.  I personally Reviewed the CT Head and it Showed LMCA distribution hypodensity  Primary Provider Notified of Diagnostic Impression and Management Plan on: 11/23/2022 04:35:28    ------------------------------------------------------------------------------  History of Present Illness: Patient is a 62 year old Female.  Patient was brought by private transportation with symptoms of speech changes. 62 F presents with speech changes. No hx of symptoms like this before. lkn: 9:30 PM BEFORE she went to BED. Woke up at  1:30 am and couldn't form sentences per her wife at bedside. Symptoms have not improved since she woke up.  No hx CVA in the past.     Past Medical History:      Hypertension      Diabetes Mellitus      There is no history of Hyperlipidemia      There is no history of Stroke      There is no history of Seizures  Medications:  No Anticoagulant use  No Antiplatelet  use Reviewed EMR for current medications  Allergies:  Reviewed  Social History: Patient Is: Married Smoking: No Alcohol Use: Yes Drug Use: No  Family History:  There is no family history of premature cerebrovascular disease pertinent to this consultation  ROS : 14 Points Review of Systems was performed and was negative except mentioned in HPI.  Past Surgical History: There Is No Surgical History Contributory To Today's Visit    Examination: BP(126/48), Pulse(94), Blood Glucose(159) 1A: Level of Consciousness - Alert; keenly responsive + 0 1B: Ask Month and Age - 1 Question Right + 1 1C: Blink Eyes & Squeeze Hands - Performs Both Tasks + 0 2: Test Horizontal Extraocular Movements - Normal + 0 3: Test Visual Fields - No Visual Loss + 0 4: Test Facial Palsy (Use Grimace if Obtunded) - Normal symmetry + 0 5A: Test Left Arm Motor Drift - No Drift for 10 Seconds + 0 5B: Test Right Arm Motor Drift - No Drift for 10 Seconds + 0 6A: Test Left Leg Motor Drift - No Drift for 5 Seconds + 0 6B: Test Right Leg Motor Drift - No Drift for 5 Seconds + 0 7: Test Limb Ataxia (FNF/Heel-Shin) - Does Not Understand + 0 8: Test Sensation - Normal; No sensory loss + 0 9: Test Language/Aphasia - Mild-Moderate Aphasia: Some Obvious Changes, Without Significant Limitation + 1 10: Test Dysarthria - Normal + 0 11: Test Extinction/Inattention - No abnormality + 0  NIHSS Score: 2   Pre-Morbid Modified Rankin Scale: 3 Points = Moderate disability; requiring some help, but able to walk without assistance  Spoke with : Dr. Katrinka Blazing  This consult was conducted in real time using interactive audio and Immunologist. Patient was informed of the technology being used for this visit and agreed to proceed. Patient located in hospital and provider located at home/office setting.   Patient is being evaluated for possible acute neurologic impairment and high probability of imminent or life-threatening  deterioration. I spent total of 36 minutes providing care to this patient, including time for face to face visit via telemedicine, review of medical records, imaging studies and discussion of findings with providers, the patient and/or family.   Dr Amedeo Kinsman   [facility] For Inpatient follow-up with [facility] physician please call [facility] [phone]. This is not an outpatient service. Post hospital discharge, please contact hospital directly.  Please do not communicate with [facility] physicians via secure chat. If you have any questions, Please contact [facility]. Please call or reconsult our service if there are any clinical or diagnostic changes.

## 2022-11-23 NOTE — ED Provider Notes (Signed)
[facility] Provider Note    Event Date/Time   First MD Initiated Contact with Patient 11/23/22 [phone]     (approximate)   History   Aphasia   HPI  Rebecca Park is a 62 y.o. female who presents to the ED for evaluation of Aphasia   I reviewed annual PCP visit from last month.  Obese patient with history of DM, HTN, HLD  Patient presents to the ED for evaluation of acute onset aphasia.  She reports going to bed "normal" but awakening overnight and her spouse noted that she was having difficulty speaking so they come into the ED.  Last known normal around 9:30 PM.  Physical Exam   Triage Vital Signs: ED Triage Vitals  Encounter Vitals Group     BP --      Systolic BP Percentile --      Diastolic BP Percentile --      Pulse --      Resp --      Temp --      Temp src --      SpO2 --      Weight 11/23/22 0355 225 lb (102.1 kg)     Height 11/23/22 0355 5\' 7"  (1.702 m)     Head Circumference --      Peak Flow --      Pain Score 11/23/22 0352 0     Pain Loc --      Pain Education --      Exclude from Growth Chart --     Most recent vital signs: Vitals:   11/23/22 0410 11/23/22 0437  BP: (!) 126/48 126/81  Pulse: 94 97  Resp: 19 20  Temp: 97.9 F (36.6 C)   SpO2: 98% 98%    General: Awake, no distress.  CV:  Good peripheral perfusion.  Resp:  Normal effort.  Abd:  No distention.  MSK:  No deformity noted.  Neuro:  Aphasia without evidence of weakness or impaired sensation to the extremities.  Other:     ED Results / Procedures / Treatments   Labs (all labs ordered are listed, but only abnormal results are displayed) Labs Reviewed  CBC - Abnormal; Notable for the following components:      Result Value   WBC 12.5 (*)    All other components within normal limits  DIFFERENTIAL - Abnormal; Notable for the following components:   Neutro Abs 10.3 (*)    All other components within normal limits  COMPREHENSIVE METABOLIC PANEL -  Abnormal; Notable for the following components:   Sodium 129 (*)    CO2 20 (*)    Glucose, Bld 159 (*)    BUN 37 (*)    Creatinine, Ser 1.26 (*)    Calcium 8.5 (*)    Alkaline Phosphatase 131 (*)    GFR, Estimated 48 (*)    All other components within normal limits  CBG MONITORING, ED - Abnormal; Notable for the following components:   Glucose-Capillary 159 (*)    All other components within normal limits  ETHANOL  PROTIME-INR  APTT  URINE DRUG SCREEN, QUALITATIVE ([facility] ONLY)  URINALYSIS, ROUTINE W REFLEX MICROSCOPIC    EKG Sinus rhythm rate 95 bpm.  Normal axis and intervals.  Nonspecific ST changes inferiorly and laterally without STEMI.  RADIOLOGY CT head interpreted by me with evidence of ischemic stroke to left MCA territory, no evidence of hemorrhagic conversion. CTA head and neck interpreted by me without evidence of  LVO  Official radiology report(s): CT ANGIO HEAD NECK W WO CM W PERF (CODE STROKE)  Result Date: 11/23/2022 CLINICAL DATA:  Difficulty speaking.  Acute infarct by head CT. EXAM: CT ANGIOGRAPHY HEAD AND NECK CT PERFUSION BRAIN TECHNIQUE: Multidetector CT imaging of the head and neck was performed using the standard protocol during bolus administration of intravenous contrast. Multiplanar CT image reconstructions and MIPs were obtained to evaluate the vascular anatomy. Carotid stenosis measurements (when applicable) are obtained utilizing NASCET criteria, using the distal internal carotid diameter as the denominator. Multiphase CT imaging of the brain was performed following IV bolus contrast injection. Subsequent parametric perfusion maps were calculated using RAPID software. RADIATION DOSE REDUCTION: This exam was performed according to the departmental dose-optimization program which includes automated exposure control, adjustment of the mA and/or kV according to patient size and/or use of iterative reconstruction technique. CONTRAST:  OMNIPAQUE IOHEXOL 350  MG/ML SOLN COMPARISON:  Head CT from earlier today FINDINGS: CTA NECK FINDINGS Aortic arch: 2 vessel branching.  No acute finding or dilatation Right carotid system: Moderate calcified plaque deposition at the bifurcation and proximal ICA without flow reducing stenosis or ulceration Left carotid system: Moderate mixed density plaque at the bifurcation and proximal ICA with primarily low-density component causing proximal ICA stenosis of at least 75%, patent lumen measuring down to 1 mm. Vertebral arteries: Calcified plaque at the left vertebral origin. No subclavian stenosis. Both vertebral arteries are smoothly contoured and widely patent to the dura. Skeleton: No acute finding Other neck: No acute finding Upper chest: Clear apical lungs Review of the MIP images confirms the above findings CTA HEAD FINDINGS Anterior circulation: Atheromatous calcification which is confluent along the carotid siphons. No major branch occlusion, proximal flow limiting stenosis, beading, or aneurysm. Posterior circulation: Calcified plaque on the V4 segments. The vertebral and basilar arteries are widely patent. No branch occlusion, beading, or aneurysm Venous sinuses: Unremarkable for arterial timing Anatomic variants: None significant Review of the MIP images confirms the above findings CT Brain Perfusion Findings: ASPECTS: 8 CBF (<30%) Volume: 0mL Perfusion (Tmax>6.0s) volume: 0mL The left frontal infarct is highlighted only as mildly delayed perfusion, likely due to vessel reopening. IMPRESSION: 1. No emergent large vessel occlusion. 2. 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 3. No penumbra by CT perfusion. Electronically Signed   By: Tiburcio Pea M.D.   On: 11/23/2022 04:50   CT HEAD CODE STROKE WO CONTRAST  Result Date: 11/23/2022 CLINICAL DATA:  Code stroke.  Aphasia EXAM: CT HEAD WITHOUT CONTRAST TECHNIQUE: Contiguous axial images were obtained from the base of the skull through the  vertex without intravenous contrast. RADIATION DOSE REDUCTION: This exam was performed according to the departmental dose-optimization program which includes automated exposure control, adjustment of the mA and/or kV according to patient size and/or use of iterative reconstruction technique. COMPARISON:  None Available. FINDINGS: Brain: Cytotoxic edema in the upper left insula and lateral frontal cortex. No hemorrhage, hydrocephalus, or masslike finding Vascular: No hyperdense vessel or unexpected calcification. Skull: Normal. Negative for fracture or focal lesion. Sinuses/Orbits: No acute finding. Other: These results were called by telephone at the time of interpretation on 11/23/2022 at 4:06 am to provider [facility] , who verbally acknowledged these results. ASPECTS [facility] Stroke Program Early CT Score) - Ganglionic level infarction (caudate, lentiform nuclei, internal capsule, insula, M1-M3 cortex): 6 - Supraganglionic infarction (M4-M6 cortex): 2 Total score (0-10 with 10 being normal): 8 IMPRESSION: 1. Acute left MCA branch  infarct.  ASPECTS is 8. 2. No intracranial hemorrhage. Electronically Signed   By: Tiburcio Pea M.D.   On: 11/23/2022 04:07    PROCEDURES and INTERVENTIONS:  .1-3 Lead EKG Interpretation  Performed by: Delton Prairie, MD Authorized by: Delton Prairie, MD     Interpretation: normal     ECG rate:  92   ECG rate assessment: normal     Rhythm: sinus rhythm     Ectopy: none     Conduction: normal   .Critical Care  Performed by: Delton Prairie, MD Authorized by: Delton Prairie, MD   Critical care provider statement:    Critical care time (minutes):  30   Critical care time was exclusive of:  Separately billable procedures and treating other patients   Critical care was necessary to treat or prevent imminent or life-threatening deterioration of the following conditions:  CNS failure or compromise   Critical care was time spent personally by me on the following activities:   Development of treatment plan with patient or surrogate, discussions with consultants, evaluation of patient's response to treatment, examination of patient, ordering and review of laboratory studies, ordering and review of radiographic studies, ordering and performing treatments and interventions, pulse oximetry, re-evaluation of patient's condition and review of old charts   Medications  iohexol (OMNIPAQUE) 350 MG/ML injection 100 mL (100 mLs Intravenous Contrast Given 11/23/22 0438)  aspirin chewable tablet 324 mg (324 mg Oral Given 11/23/22 0447)  clopidogrel (PLAVIX) tablet 300 mg (300 mg Oral Given 11/23/22 0515)     IMPRESSION / MDM / ASSESSMENT AND PLAN / ED COURSE  I reviewed the triage vital signs and the nursing notes.  Differential diagnosis includes, but is not limited to, ischemic stroke, hemorrhagic stroke, complex migraine, seizure  {Patient presents with symptoms of an acute illness or injury that is potentially life-threatening.  Patient presents with evidence of an acute ischemic stroke outside of any interventional window, without evidence of LVO, requiring admission for stroke workup.  Aphasia is his only symptom.  Area of ischemia is already notable on her CT head.  Angiogram without LVO.  Mild leukocytosis but I doubt infectious etiology of her symptoms.  Mild metabolic derangements with hyponatremia and slight elevation of creatinine.  Aspirin Plavix provided, per neurology recommendations.  We will consult medicine for admission  Clinical Course as of 11/23/22 0521  Tue Nov 23, 2022  0406 Call from rads., left frontal lobe infarct [DS]  0429 Reassessed as tele-neuro is evaluating. Now headed back to ct for angiograms [DS]  0444 Discuss with teleneuro doc, full dose asa. Admit for stroke workup. F/u cta for unlikely possibility of LVO [DS]  0501 Callback from neurology.  Recommend starting Plavix due to 75% ICA stenosis [DS]    Clinical Course User Index [DS]  Delton Prairie, MD     FINAL CLINICAL IMPRESSION(S) / ED DIAGNOSES   Final diagnoses:  Aphasia  Cerebrovascular accident (CVA) due to other mechanism [facility])     Rx / DC Orders   ED Discharge Orders     None        Note:  This document was prepared using Dragon voice recognition software and may include unintentional dictation errors.   Delton Prairie, MD 11/23/22 [phone]

## 2022-11-23 NOTE — ED Notes (Signed)
Pt transported to floor by me and on CCM.

## 2022-11-23 NOTE — ED Triage Notes (Addendum)
Pt to triage via w/c with no distress; accomp by SO who reports pt with diff speaking; pt with expressive aphasia noted; MAEW, grips = & strong; smile symmetrical; no reports numbness or weakness; st went to bed at 930pm, awoke at 2am to go to BR, went into the kitchen and SO found her sitting there with diff speaking, slurred speech; pt denies any c/o pain, denies hx of same; reports recent congestion; pt taken to room 15 by Quita Skye, RN for further eval

## 2022-11-23 NOTE — Progress Notes (Addendum)
0410: Tele stroke cart activated at this time. Pt NCCT completed prior to activation.   0413: TSP paged at this time  0417: TSP on camera at this time. Results of NCCT provided to TSP.   0427: Pt back to CT for advanced imaging.   0431: TSP off camera at this time. TSP to follow up with EDP for plan of care.   3086: Pt returned from CT.   0450: TSRN off cart at this time.   5784: Results of advanced imaging provided to TSP via secure message.

## 2022-11-23 NOTE — Progress Notes (Signed)
SLP Note: Chart reviewed, Pt visited for potential evaluation. Pt reports she is awaiting MRI. Pt was pleasant and alert with wife and mother in the room. Pt is able to communicate needs and wants with slow deliberate speech but noted some expressive aphasia. Pt and family report it has improved since arrival at 3am today, but is not at baseline. Pt reports no swallowing problems but has not eaten until MRI is completed, only had sips of liquid. Will await further assessment as Pt has not even been here 24hrs and has shown some improvement. Will proceed with speech language eval tomorrow as appropriate. Pt agreed to plan.

## 2022-11-23 NOTE — Evaluation (Signed)
Physical Therapy Evaluation and Discharge  Patient Details Name: Rebecca Park MRN: [id number] DOB: 12-Jan-1961 Today's Date: 11/23/2022  History of Present Illness  Patient is a 62 year old female with acute aphasia. CT of head with acute left MCA stroke. History of HTN, chronic HFrEF with LVEF 45-50%, IIDM ,HLD, left BKA   Clinical Impression  Patient is agreeable to PT evaluation. She lives with her spouse and is modified independent at baseline using her prosthesis and a cane for ambulation. She drives and works.  The patient has expressive aphasia but is able to follow all commands without difficulty. She reports her speech is improving. She is modified independent with all mobility, including short distance ambulation with her prosthesis and cane. The patient feels she is at her baseline level of functional mobility with her primary concern being her speech. No acute PT needs are identified at this time as the patient appears to be at baseline functional status. PT will sign off.             Equipment Recommendations None recommended by PT     Functional Status Assessment Patient has not had a recent decline in their functional status     Precautions / Restrictions Precautions Precautions: Fall Restrictions Weight Bearing Restrictions: No      Mobility  Bed Mobility Overal bed mobility: Modified Independent                  Transfers Overall transfer level: Modified independent                      Ambulation/Gait Ambulation/Gait assistance: Modified independent (Device/Increase time) Gait Distance (Feet): 30 Feet Assistive device: Straight cane Gait Pattern/deviations: Step-through pattern, Decreased stride length, Decreased stance time - left Gait velocity: decreased     General Gait Details: patient ambulated with her own cane and prosthesis in place. no loss of balance with standing activity  Stairs            Wheelchair Mobility      Tilt Bed    Modified Rankin (Stroke Patients Only)       Balance Overall balance assessment: Needs assistance Sitting-balance support: Feet supported Sitting balance-Leahy Scale: Good     Standing balance support: Single extremity supported Standing balance-Leahy Scale: Fair Standing balance comment: using cane in RUE for support in standing (and LLE prosthesis)                             Pertinent Vitals/Pain Pain Assessment Pain Assessment: No/denies pain    Home Living Family/patient expects to be discharged to:: Private residence Living Arrangements: Spouse/significant other Available Help at Discharge: Family;Available 24 hours/day Type of Home: House Home Access: Stairs to enter Entrance Stairs-Rails: Doctor, general practice of Steps: 3   Home Layout: One level Home Equipment: Agricultural consultant (2 wheels);Cane - single point;Shower seat;Wheelchair - manual      Prior Function Prior Level of Function : Independent/Modified Independent             Mobility Comments: using prosthesis and cane       Extremity/Trunk Assessment   Upper Extremity Assessment Upper Extremity Assessment: Right hand dominant;RUE deficits/detail (LUE WFL for tasks assessed) RUE Deficits / Details: 4/5 shoulder flexion RUE Sensation: WNL    Lower Extremity Assessment Lower Extremity Assessment: RLE deficits/detail;LLE deficits/detail RLE Deficits / Details: 5/5 dorsiflexion, plantarflexion. weight bearing without knee buckling RLE Sensation: history of  peripheral neuropathy;WNL LLE Deficits / Details: BKA with prosthesis in place. patient able to advance hip with ambulation without difficulty LLE Sensation: history of peripheral neuropathy       Communication   Communication Communication: Difficulty communicating thoughts/reduced clarity of speech  Cognition Arousal: Alert Behavior During Therapy: WFL for tasks assessed/performed Overall Cognitive  Status: Within Functional Limits for tasks assessed                                 General Comments: patient is alert and oriented. she is able to follow all commands without difficulty. expressive aphasia present but improving per patient and family report        General Comments      Exercises     Assessment/Plan    PT Assessment Patient does not need any further PT services  PT Problem List         PT Treatment Interventions      PT Goals (Current goals can be found in the Care Plan section)  Acute Rehab PT Goals PT Goal Formulation: All assessment and education complete, DC therapy    Frequency       Co-evaluation               AM-PAC PT "6 Clicks" Mobility  Outcome Measure Help needed turning from your back to your side while in a flat bed without using bedrails?: None Help needed moving from lying on your back to sitting on the side of a flat bed without using bedrails?: None Help needed moving to and from a bed to a chair (including a wheelchair)?: None Help needed standing up from a chair using your arms (e.g., wheelchair or bedside chair)?: None Help needed to walk in hospital room?: None Help needed climbing 3-5 steps with a railing? : None 6 Click Score: 24    End of Session   Activity Tolerance: Patient tolerated treatment well Patient left: in bed;with call bell/phone within reach;with family/visitor present Nurse Communication: Mobility status PT Visit Diagnosis: Other abnormalities of gait and mobility (R26.89)    Time: 4034-7425 PT Time Calculation (min) (ACUTE ONLY): 12 min   Charges:   PT Evaluation $PT Eval Low Complexity: 1 Low   PT General Charges $$ ACUTE PT VISIT: 1 Visit         Donna Bernard, PT, MPT   Ina Homes 11/23/2022, 2:29 PM

## 2022-11-23 NOTE — H&P (Signed)
History and Physical    Rebecca Park:[id number] DOB: 1960-07-14 DOA: 11/23/2022  PCP: Patrice Paradise, MD (Confirm with patient/family/NH records and if not entered, this has to be entered at [facility] point of entry) Patient coming from: Home  I have personally briefly reviewed patient's old medical records in [facility]  Chief Complaint: Speech difficulties  HPI: Rebecca Park is a 62 y.o. female with medical history significant of HTN, chronic HFrEF with LVEF 45-50%, IIDM ,HLD, left BKA, presented with acute aphasia.  Patient was last known normal around 9:30 PM yesterday, early morning this morning, patient woke up and found " trouble to find my words" witnessed by her significant other at bedside.  She denies any weakness numbness of any of the limbs, no headache no double vision. ED Course: Afebrile, nontachycardic nonhypotensive not hypoxic.  CT head acute left MCA stroke, CTA showed left ICA 75% stenosis.  Teleneurology evaluated patient and consider patient not a candidate for TNK due to low NISS score, and recommended medical management with aspirin and Plavix.  Teleneurology also recommended vascular evaluation for revascularization of left ICA.  Review of Systems: As per HPI otherwise 14 point review of systems negative.   Past Medical History:  Diagnosis Date   Diabetes mellitus without complication (HCC)    Hypertension    PONV (postoperative nausea and vomiting)    the day after rt 5th toe amputation surgery    Past Surgical History:  Procedure Laterality Date   AMPUTATION Right 11/10/2018   Procedure: AMPUTATION RAY (amputation 5th metatarsal Right foot;  Surgeon: Rosetta Posner, DPM;  Location: [facility];  Service: Podiatry;  Laterality: Right;   AMPUTATION Left 03/15/2021   Procedure: AMPUTATION BELOW KNEE;  Surgeon: Learta Codding, MD;  Location: [facility];  Service: Vascular;  Laterality: Left;   AMPUTATION TOE Right 12/05/2018   Procedure: RAY RIGHT 4TH  AMPUTATION;  Surgeon: Rosetta Posner, DPM;  Location: [facility];  Service: Podiatry;  Laterality: Right;  mac with local DIABETIC-oral meds   BONE BIOPSY Right 11/10/2018   Procedure: BONE BIOPSY- Right 4th toe;  Surgeon: Rosetta Posner, DPM;  Location: [facility];  Service: Podiatry;  Laterality: Right;   BUNIONECTOMY Bilateral    COLONOSCOPY WITH PROPOFOL N/A 09/07/2018   Procedure: COLONOSCOPY WITH PROPOFOL;  Surgeon: Toney Reil, MD;  Location: [facility];  Service: Gastroenterology;  Laterality: N/A;   COLONOSCOPY WITH PROPOFOL N/A 09/17/2019   Procedure: COLONOSCOPY WITH PROPOFOL;  Surgeon: Toney Reil, MD;  Location: [facility] ENDOSCOPY;  Service: Gastroenterology;  Laterality: N/A;   TEE WITHOUT CARDIOVERSION N/A 03/18/2021   Procedure: TRANSESOPHAGEAL ECHOCARDIOGRAM (TEE);  Surgeon: Antonieta Iba, MD;  Location: [facility];  Service: Cardiovascular;  Laterality: N/A;     reports that she has never smoked. She has never used smokeless tobacco. She reports that she does not currently use alcohol. She reports that she does not use drugs.  No Known Allergies  Family History  Problem Relation Age of Onset   Heart disease Father    Heart attack Father    Heart disease Maternal Grandfather    Breast cancer Neg Hx     Prior to Admission medications   Medication Sig Start Date End Date Taking? Authorizing Provider  amLODipine (NORVASC) 5 MG tablet Take 5 mg by mouth daily. 09/01/21  Yes [provider]  atorvastatin (LIPITOR) 40 MG tablet TAKE 1 TABLET BY MOUTH EVERY DAY 07/02/20  Yes Baity, Salvadore Oxford, NP  glipiZIDE (GLUCOTROL)  10 MG tablet Take 10 mg by mouth daily before breakfast.   Yes [provider]  losartan-hydrochlorothiazide (HYZAAR) 100-12.5 MG tablet Take 1 tablet by mouth daily. 08/31/22  Yes [provider]  metoprolol succinate (TOPROL-XL) 25 MG 24 hr tablet Take 1 tablet by mouth daily. 09/16/22  Yes [provider]   MOUNJARO 7.5 MG/0.5ML Pen Inject 7.5 mg into the skin once a week. 09/17/22  Yes [provider]  pantoprazole (PROTONIX) 40 MG tablet Take 1 tablet (40 mg total) by mouth 2 (two) times daily. 03/27/21  Yes Azucena Fallen, MD  Lancets 30G MISC 1 each by Does not apply route 2 (two) times daily. 01/18/19   Lorre Munroe, NP  Multiple Vitamins-Minerals (WOMENS MULTIVITAMIN PO) Take 1 tablet by mouth daily.  Patient not taking: Reported on 05/31/2022    [provider]  ONETOUCH ULTRA test strip 1 EACH BY OTHER ROUTE 2 (TWO) TIMES DAILY. 07/11/19   Lorre Munroe, NP  SANTYL ointment Apply 1 application topically daily as needed. Patient not taking: Reported on 11/23/2022 01/19/21   [provider]  torsemide (DEMADEX) 10 MG tablet Take 1 tablet (10 mg total) by mouth daily. Patient not taking: Reported on 05/31/2022 03/28/21   Azucena Fallen, MD  TRULICITY 1.5 MG/0.5ML SOPN Inject 0.5 mLs into the skin once a week. Patient not taking: Reported on 05/31/2022 02/16/21   [provider]    Physical Exam: Vitals:   11/23/22 0437 11/23/22 0438 11/23/22 0600 11/23/22 0747  BP: 126/81  (!) 140/72 (!) 115/55  Pulse: 97  99 (!) 102  Resp: 20  (!) 24 18  Temp:    97.9 F (36.6 C)  TempSrc:    Oral  SpO2: 98%  100% 95%  Weight:  110.7 kg    Height:        Constitutional: NAD, calm, comfortable Vitals:   11/23/22 0437 11/23/22 0438 11/23/22 0600 11/23/22 0747  BP: 126/81  (!) 140/72 (!) 115/55  Pulse: 97  99 (!) 102  Resp: 20  (!) 24 18  Temp:    97.9 F (36.6 C)  TempSrc:    Oral  SpO2: 98%  100% 95%  Weight:  110.7 kg    Height:       Eyes: PERRL, lids and conjunctivae normal ENMT: Mucous membranes are moist. Posterior pharynx clear of any exudate or lesions.Normal dentition.  Neck: normal, supple, no masses, no thyromegaly Respiratory: clear to auscultation bilaterally, no wheezing, no crackles. Normal respiratory effort. No accessory  muscle use.  Cardiovascular: Regular rate and rhythm, no murmurs / rubs / gallops. No extremity edema. 2+ pedal pulses. No carotid bruits.  Abdomen: no tenderness, no masses palpated. No hepatosplenomegaly. Bowel sounds positive.  Musculoskeletal: no clubbing / cyanosis. No joint deformity upper and lower extremities. Good ROM, no contractures. Normal muscle tone.  Skin: no rashes, lesions, ulcers. No induration Neurologic: CN 2-12 grossly intact. Sensation intact, DTR normal. Strength 5/5 in all 4.  Psychiatric: Normal judgment and insight. Alert and oriented x 3. Normal mood.     Labs on Admission: I have personally reviewed following labs and imaging studies  CBC: Recent Labs  Lab 11/23/22 0400  WBC 12.5*  NEUTROABS 10.3*  HGB 12.4  HCT 37.8  MCV 87.3  PLT 198   Basic Metabolic Panel: Recent Labs  Lab 11/23/22 0400  NA 129*  K 3.9  CL 99  CO2 20*  GLUCOSE 159*  BUN 37*  CREATININE  1.26*  CALCIUM 8.5*   GFR: Estimated Creatinine Clearance: 59.3 mL/min (A) (by C-G formula based on SCr of 1.26 mg/dL (H)). Liver Function Tests: Recent Labs  Lab 11/23/22 0400  AST 17  ALT 19  ALKPHOS 131*  BILITOT 0.7  PROT 7.0  ALBUMIN 3.9   No results for input(s): "LIPASE", "AMYLASE" in the last 168 hours. No results for input(s): "AMMONIA" in the last 168 hours. Coagulation Profile: Recent Labs  Lab 11/23/22 0400  INR 1.1   Cardiac Enzymes: No results for input(s): "CKTOTAL", "CKMB", "CKMBINDEX", "TROPONINI" in the last 168 hours. BNP (last 3 results) No results for input(s): "PROBNP" in the last 8760 hours. HbA1C: No results for input(s): "HGBA1C" in the last 72 hours. CBG: Recent Labs  Lab 11/23/22 0351 11/23/22 0817  GLUCAP 159* 209*   Lipid Profile: No results for input(s): "CHOL", "HDL", "LDLCALC", "TRIG", "CHOLHDL", "LDLDIRECT" in the last 72 hours. Thyroid Function Tests: No results for input(s): "TSH", "T4TOTAL", "FREET4", "T3FREE", "THYROIDAB" in the  last 72 hours. Anemia Panel: No results for input(s): "VITAMINB12", "FOLATE", "FERRITIN", "TIBC", "IRON", "RETICCTPCT" in the last 72 hours. Urine analysis:    Component Value Date/Time   COLORURINE YELLOW (A) 11/23/2022 0621   APPEARANCEUR TURBID (A) 11/23/2022 0621   LABSPEC 1.025 11/23/2022 0621   PHURINE 5.0 11/23/2022 0621   GLUCOSEU NEGATIVE 11/23/2022 0621   HGBUR SMALL (A) 11/23/2022 0621   BILIRUBINUR NEGATIVE 11/23/2022 0621   KETONESUR NEGATIVE 11/23/2022 0621   PROTEINUR 100 (A) 11/23/2022 0621   NITRITE NEGATIVE 11/23/2022 0621   LEUKOCYTESUR LARGE (A) 11/23/2022 0621    Radiological Exams on Admission: CT ANGIO HEAD NECK W WO CM W PERF (CODE STROKE)  Result Date: 11/23/2022 CLINICAL DATA:  Difficulty speaking.  Acute infarct by head CT. EXAM: CT ANGIOGRAPHY HEAD AND NECK CT PERFUSION BRAIN TECHNIQUE: Multidetector CT imaging of the head and neck was performed using the standard protocol during bolus administration of intravenous contrast. Multiplanar CT image reconstructions and MIPs were obtained to evaluate the vascular anatomy. Carotid stenosis measurements (when applicable) are obtained utilizing NASCET criteria, using the distal internal carotid diameter as the denominator. Multiphase CT imaging of the brain was performed following IV bolus contrast injection. Subsequent parametric perfusion maps were calculated using RAPID software. RADIATION DOSE REDUCTION: This exam was performed according to the departmental dose-optimization program which includes automated exposure control, adjustment of the mA and/or kV according to patient size and/or use of iterative reconstruction technique. CONTRAST:  OMNIPAQUE IOHEXOL 350 MG/ML SOLN COMPARISON:  Head CT from earlier today FINDINGS: CTA NECK FINDINGS Aortic arch: 2 vessel branching.  No acute finding or dilatation Right carotid system: Moderate calcified plaque deposition at the bifurcation and proximal ICA without flow  reducing stenosis or ulceration Left carotid system: Moderate mixed density plaque at the bifurcation and proximal ICA with primarily low-density component causing proximal ICA stenosis of at least 75%, patent lumen measuring down to 1 mm. Vertebral arteries: Calcified plaque at the left vertebral origin. No subclavian stenosis. Both vertebral arteries are smoothly contoured and widely patent to the dura. Skeleton: No acute finding Other neck: No acute finding Upper chest: Clear apical lungs Review of the MIP images confirms the above findings CTA HEAD FINDINGS Anterior circulation: Atheromatous calcification which is confluent along the carotid siphons. No major branch occlusion, proximal flow limiting stenosis, beading, or aneurysm. Posterior circulation: Calcified plaque on the V4 segments. The vertebral and basilar arteries are widely patent. No branch occlusion, beading, or aneurysm Venous  sinuses: Unremarkable for arterial timing Anatomic variants: None significant Review of the MIP images confirms the above findings CT Brain Perfusion Findings: ASPECTS: 8 CBF (<30%) Volume: 0mL Perfusion (Tmax>6.0s) volume: 0mL The left frontal infarct is highlighted only as mildly delayed perfusion, likely due to vessel reopening. IMPRESSION: 1. No emergent large vessel occlusion. 2. 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 3. No penumbra by CT perfusion. Electronically Signed   By: Tiburcio Pea M.D.   On: 11/23/2022 04:50   CT HEAD CODE STROKE WO CONTRAST  Result Date: 11/23/2022 CLINICAL DATA:  Code stroke.  Aphasia EXAM: CT HEAD WITHOUT CONTRAST TECHNIQUE: Contiguous axial images were obtained from the base of the skull through the vertex without intravenous contrast. RADIATION DOSE REDUCTION: This exam was performed according to the departmental dose-optimization program which includes automated exposure control, adjustment of the mA and/or kV according to patient size and/or  use of iterative reconstruction technique. COMPARISON:  None Available. FINDINGS: Brain: Cytotoxic edema in the upper left insula and lateral frontal cortex. No hemorrhage, hydrocephalus, or masslike finding Vascular: No hyperdense vessel or unexpected calcification. Skull: Normal. Negative for fracture or focal lesion. Sinuses/Orbits: No acute finding. Other: These results were called by telephone at the time of interpretation on 11/23/2022 at 4:06 am to provider [facility] , who verbally acknowledged these results. ASPECTS [facility] Stroke Program Early CT Score) - Ganglionic level infarction (caudate, lentiform nuclei, internal capsule, insula, M1-M3 cortex): 6 - Supraganglionic infarction (M4-M6 cortex): 2 Total score (0-10 with 10 being normal): 8 IMPRESSION: 1. Acute left MCA branch infarct.  ASPECTS is 8. 2. No intracranial hemorrhage. Electronically Signed   By: Tiburcio Pea M.D.   On: 11/23/2022 04:07    EKG: Pending  Assessment/Plan Principal Problem:   Acute CVA (cerebrovascular accident) [facility]) Active Problems:   Diabetes mellitus (HCC)   Essential hypertension   Carotid stenosis, left   CVA (cerebral vascular accident) (HCC)  (please populate well all problems here in Problem List. (For example, if patient is on BP meds at home and you resume or decide to hold them, it is a problem that needs to be her. Same for CAD, COPD, HLD and so on)  Acute expressive aphasia -Secondary to left MCA branch ischemic stroke.  CT head showed cytotoxic edema in the upper left insula and left frontal cortex. -CTA showed proximal left ICA 75% stenosis.  Secure text neurosurgeon Dr. Gilda Crease and waiting for reply. -Patient was loaded with aspirin and Plavix in the ED as per telemetry neurology, will continue aspirin 81 mg and Plavix 75 mg daily -Check A1c and lipid panel -Allow permissive hypertension for 2 days -Speech evaluation  HTN Chronic HFrEF -Euvolemic, hold off home BP meds -Hold off  diuresis  IIDM -Hold off p.o. diabetic medications -Start SSI  HLD -Continue atorvastatin 40 mg daily -Check lipid panel  DVT prophylaxis: Lovenox Code Status: Full code Family Communication: Significant other at bedside Disposition Plan: Patient is sick with acute ischemic stroke with left ICA stenosis, requiring inpatient vascular surgery evaluation.  Expect more than 2 midnight hospital stay Consults called: Neurology, neurosurgery Admission status: Telemetry admission  Emeline General MD [facility] Pager [phone]  11/23/2022, 8:48 AM

## 2022-11-24 DIAGNOSIS — I639 Cerebral infarction, unspecified: Secondary | ICD-10-CM | POA: Diagnosis not present

## 2022-11-24 LAB — CBC
HCT: 35.7 % — ABNORMAL LOW (ref 36.0–46.0)
Hemoglobin: 11.8 g/dL — ABNORMAL LOW (ref 12.0–15.0)
MCH: 28.6 pg (ref 26.0–34.0)
MCHC: 33.1 g/dL (ref 30.0–36.0)
MCV: 86.4 fL (ref 80.0–100.0)
Platelets: 178 10*3/uL (ref 150–400)
RBC: 4.13 MIL/uL (ref 3.87–5.11)
RDW: 13.8 % (ref 11.5–15.5)
WBC: 9.2 10*3/uL (ref 4.0–10.5)
nRBC: 0 % (ref 0.0–0.2)

## 2022-11-24 LAB — BASIC METABOLIC PANEL
Anion gap: 9 (ref 5–15)
BUN: 27 mg/dL — ABNORMAL HIGH (ref 8–23)
CO2: 21 mmol/L — ABNORMAL LOW (ref 22–32)
Calcium: 9 mg/dL (ref 8.9–10.3)
Chloride: 106 mmol/L (ref 98–111)
Creatinine, Ser: 1.21 mg/dL — ABNORMAL HIGH (ref 0.44–1.00)
GFR, Estimated: 51 mL/min — ABNORMAL LOW (ref >60)
Glucose, Bld: 163 mg/dL — ABNORMAL HIGH (ref 70–99)
Potassium: 4.4 mmol/L (ref 3.5–5.1)
Sodium: 133 mmol/L — ABNORMAL LOW (ref 135–145)

## 2022-11-24 LAB — LIPID PANEL
Cholesterol: 128 mg/dL (ref 0–200)
HDL: 48 mg/dL (ref >40)
LDL Cholesterol: 56 mg/dL (ref 0–99)
Total CHOL/HDL Ratio: 2.7 ratio
Triglycerides: 121 mg/dL (ref <150)
VLDL: 24 mg/dL (ref 0–40)

## 2022-11-24 LAB — GLUCOSE, CAPILLARY
Glucose-Capillary: 164 mg/dL — ABNORMAL HIGH (ref 70–99)
Glucose-Capillary: 162 mg/dL — ABNORMAL HIGH (ref 70–99)
Glucose-Capillary: 147 mg/dL — ABNORMAL HIGH (ref 70–99)

## 2022-11-24 LAB — HIV ANTIBODY (ROUTINE TESTING W REFLEX): HIV Screen 4th Generation wRfx: NONREACTIVE

## 2022-11-24 MED ORDER — ASPIRIN 81 MG PO TBEC: 81.0000 mg | DELAYED_RELEASE_TABLET | Freq: Every day | ORAL | 1 refills | Status: AC

## 2022-11-24 MED ORDER — CLOPIDOGREL BISULFATE 75 MG PO TABS: 75.0000 mg | ORAL_TABLET | Freq: Every day | ORAL | 1 refills | Status: DC

## 2022-11-24 MED ORDER — ATORVASTATIN CALCIUM 40 MG PO TABS: 40.0000 mg | ORAL_TABLET | Freq: Every day | ORAL | 2 refills | Status: AC

## 2022-11-24 NOTE — Progress Notes (Addendum)
Per Dr Fran Lowes, keep tele monitoring at this time

## 2022-11-24 NOTE — Plan of Care (Signed)
  Problem: Activity: Goal: Risk for activity intolerance will decrease Outcome: Progressing   Problem: Nutrition: Goal: Adequate nutrition will be maintained Outcome: Progressing   Problem: Coping: Goal: Level of anxiety will decrease Outcome: Progressing   Problem: Elimination: Goal: Will not experience complications related to bowel motility Outcome: Progressing Goal: Will not experience complications related to urinary retention Outcome: Progressing   Problem: Pain Managment: Goal: General experience of comfort will improve Outcome: Progressing   Problem: Skin Integrity: Goal: Risk for impaired skin integrity will decrease Outcome: Progressing   Problem: Tissue Perfusion: Goal: Adequacy of tissue perfusion will improve Outcome: Progressing

## 2022-11-24 NOTE — Discharge Summary (Signed)
Physician Discharge Summary   AAISHAH INZER  female DOB: 1960/05/19  UJW:[id number]  PCP: Patrice Paradise, MD  Admit date: 11/23/2022 Discharge date: 11/24/2022  Admitted From: home Disposition:  home CODE STATUS: Full code  Discharge Instructions     Ambulatory referral to Neurology   Complete by: As directed    Diet - low sodium heart healthy   Complete by: As directed    Discharge instructions   Complete by: As directed    Please take aspirin 81 mg and plavix together until you follow up with Vascular Surgery who will then make recommendation whether to continue.  Continue taking your home Lipitor. [facility] Course:  For full details, please see H&P, progress notes, consult notes and ancillary notes.  Briefly,  Rebecca Park is a 62 y.o. female with medical history significant of HTN, chronic HFrEF with LVEF 45-50%, IIDM ,HLD, left BKA, presented with acute aphasia.   Acute expressive aphasia 2/2 Acute left MCA branch ischemic stroke CT head showed cytotoxic edema in the upper left insula and left frontal cortex. -CTA showed proximal left ICA 75% stenosis.   -Patient was loaded with aspirin and Plavix in the ED as per telemetry neurology. --discharged on aspirin 81 mg and plavix together until outpatient follow up with Vascular Surgery. --LDL 56, cont home Lipitor 40 mg daily --outpatient endovascular left carotid artery angioplasty and stent placement in 2 weeks; Vascular surgery to schedule  HTN Chronic HFrEF --cont Toprol, Hyzaar and amlodipine --pt not taking torsemide PTA   IIDM --pt not taking Trulicity PTA --cont glipizide and Mounjaro   HLD -Continue atorvastatin 40 mg daily   Unless noted above, medications under "STOP" list are ones pt was not taking PTA.  Discharge Diagnoses:  Principal Problem:   Acute CVA (cerebrovascular accident) [facility]) Active Problems:   Diabetes mellitus (HCC)   Essential hypertension   Carotid  stenosis, left   CVA (cerebral vascular accident) (HCC)   30 Day Unplanned Readmission Risk Score    Flowsheet Row ED to Hosp-Admission (Current) from 11/23/2022 in [facility] 1C MEDICAL TELEMETRY  30 Day Unplanned Readmission Risk Score (%) 11.66 Filed at 11/24/2022 1201       This score is the patient's risk of an unplanned readmission within 30 days of being discharged (0 -100%). The score is based on dignosis, age, lab data, medications, orders, and past utilization.   Low:  0-14.9   Medium: 15-21.9   High: 22-29.9   Extreme: 30 and above         Discharge Instructions:  Allergies as of 11/24/2022   No Known Allergies      Medication List     STOP taking these medications    Santyl 250 UNIT/GM ointment Generic drug: collagenase   torsemide 10 MG tablet Commonly known as: DEMADEX   Trulicity 1.5 MG/0.5ML Sopn Generic drug: Dulaglutide   WOMENS MULTIVITAMIN PO       TAKE these medications    amLODipine 5 MG tablet Commonly known as: NORVASC Take 5 mg by mouth daily.   aspirin EC 81 MG tablet Take 1 tablet (81 mg total) by mouth daily. Swallow whole. Start taking on: November 25, 2022   atorvastatin 40 MG tablet Commonly known as: LIPITOR Take 1 tablet (40 mg total) by mouth daily.   clopidogrel 75 MG tablet Commonly known as: PLAVIX Take 1 tablet (75 mg total) by mouth daily. Start taking on: November 25, 2022   glipiZIDE 10 MG tablet Commonly known as: GLUCOTROL Take 10 mg by mouth daily before breakfast.   Lancets 30G Misc 1 each by Does not apply route 2 (two) times daily.   losartan-hydrochlorothiazide 100-12.5 MG tablet Commonly known as: HYZAAR Take 1 tablet by mouth daily.   metoprolol succinate 25 MG 24 hr tablet Commonly known as: TOPROL-XL Take 1 tablet by mouth daily.   Mounjaro 7.5 MG/0.5ML Pen Generic drug: tirzepatide Inject 7.5 mg into the skin once a week.   OneTouch Ultra test strip Generic drug:  glucose blood 1 EACH BY OTHER ROUTE 2 (TWO) TIMES DAILY.   pantoprazole 40 MG tablet Commonly known as: PROTONIX Take 1 tablet (40 mg total) by mouth 2 (two) times daily.         Follow-up Information     Schnier, Latina Craver, MD Follow up.   Specialties: Vascular Surgery, Cardiology, Radiology, Vascular Surgery Contact information: [address] [place] [address] [phone]         Patrice Paradise, MD Follow up in 1 week(s).   Specialty: Physician Assistant Contact information: [phone] [facility] [address] [place] [facility] [place] [address] [phone]                 No Known Allergies   The results of significant diagnostics from this hospitalization (including imaging, microbiology, ancillary and laboratory) are listed below for reference.   Consultations:   Procedures/Studies: MR BRAIN WO CONTRAST  Result Date: 11/23/2022 CLINICAL DATA:  Neuro deficit, acute, stroke suspected. Expressive aphasia. EXAM: MRI HEAD WITHOUT CONTRAST TECHNIQUE: Multiplanar, multiecho pulse sequences of the brain and surrounding structures were obtained without intravenous contrast. COMPARISON:  Head CT and CTA 11/23/2022 FINDINGS: Brain: As seen on CT, there is an acute left MCA infarct involving the insula and frontal operculum with greatest span of 4.5 cm and with associated trace petechial hemorrhage. There is mild cytotoxic edema without mass effect. A few punctate acute cortical infarcts are noted in the left parietal lobe. No age advanced chronic white matter disease is evident. No mass, midline shift, or extra-axial fluid collection is seen. The ventricles are normal in size. Vascular: Major intracranial vascular flow voids are preserved. Skull and upper cervical spine: Unremarkable bone marrow signal. Sinuses/Orbits: Unremarkable orbits. No significant inflammatory changes in the paranasal sinuses. Clear mastoid air cells. Other: None. IMPRESSION:  Acute left MCA infarct primarily involving the left frontal operculum and insula. Electronically Signed   By: Sebastian Ache M.D.   On: 11/23/2022 15:56   ECHOCARDIOGRAM COMPLETE  Result Date: 11/23/2022    ECHOCARDIOGRAM REPORT   Patient Name:   ROSEMARIE ROHR Date of Exam: 11/23/2022 Medical Rec #:  [id number]      Height:       67.0 in Accession #:    [id number]     Weight:       244.0 lb Date of Birth:  [birth year]       BSA:          2.201 m Patient Age:    62 years       BP:           115/55 mmHg Patient Gender: F              HR:           102 bpm. Exam Location:  [facility] Procedure: 2D Echo, Cardiac Doppler, Color Doppler and Saline Contrast Bubble  Study Indications:     Stroke I63.9  History:         Patient has prior history of Echocardiogram examinations, most                  recent 03/18/2021. Risk Factors:Hypertension and Diabetes.  Sonographer:     Cristela Blue Referring Phys:  [id number] Emeline General Diagnosing Phys: Yvonne Kendall MD  Sonographer Comments: Technically challenging study due to limited acoustic windows. IMPRESSIONS  1. Left ventricular ejection fraction, by estimation, is >55%. The left ventricle has normal function. Left ventricular endocardial border not optimally defined to evaluate regional wall motion. There is mild left ventricular hypertrophy. Indeterminate diastolic filling due to E-A fusion.  2. Right ventricular systolic function is normal. The right ventricular size is normal. Tricuspid regurgitation signal is inadequate for assessing PA pressure.  3. The mitral valve is degenerative. Trivial mitral valve regurgitation. No evidence of mitral stenosis.  4. The aortic valve was not well visualized. There is mild calcification of the aortic valve. There is mild thickening of the aortic valve. Aortic valve regurgitation is not visualized. Aortic valve sclerosis/calcification is present, without any evidence of aortic stenosis.  5. The inferior vena cava is normal in size with  greater than 50% respiratory variability, suggesting right atrial pressure of 3 mmHg.  6. Bubble study is nondiagnostic due to suboptimal acoustic windows. FINDINGS  Left Ventricle: Left ventricular ejection fraction, by estimation, is >55%. The left ventricle has normal function. Left ventricular endocardial border not optimally defined to evaluate regional wall motion. The left ventricular internal cavity size was  normal in size. There is mild left ventricular hypertrophy. Indeterminate diastolic filling due to E-A fusion. Right Ventricle: The right ventricular size is normal. No increase in right ventricular wall thickness. Right ventricular systolic function is normal. Tricuspid regurgitation signal is inadequate for assessing PA pressure. Left Atrium: Left atrial size was normal in size. Right Atrium: Right atrial size was normal in size. Pericardium: There is no evidence of pericardial effusion. Presence of epicardial fat layer. Mitral Valve: The mitral valve is degenerative in appearance. There is mild thickening of the mitral valve leaflet(s). Trivial mitral valve regurgitation. No evidence of mitral valve stenosis. Tricuspid Valve: The tricuspid valve is not well visualized. Tricuspid valve regurgitation is mild. Aortic Valve: The aortic valve was not well visualized. There is mild calcification of the aortic valve. There is mild thickening of the aortic valve. Aortic valve regurgitation is not visualized. Aortic valve sclerosis/calcification is present, without any evidence of aortic stenosis. Aortic valve mean gradient measures 5.0 mmHg. Aortic valve peak gradient measures 9.4 mmHg. Aortic valve area, by VTI measures 1.71 cm. Pulmonic Valve: The pulmonic valve was not well visualized. Pulmonic valve regurgitation is not visualized. No evidence of pulmonic stenosis. Aorta: The aortic root is normal in size and structure. Pulmonary Artery: The pulmonary artery is not well seen. Venous: The inferior vena  cava is normal in size with greater than 50% respiratory variability, suggesting right atrial pressure of 3 mmHg. IAS/Shunts: The interatrial septum was not well visualized. Agitated saline contrast was given intravenously to evaluate for intracardiac shunting. Bubble study is nondiagnostic due to suboptimal acoustic windows.  LEFT VENTRICLE PLAX 2D LVIDd:         4.47 cm   Diastology LVIDs:         3.00 cm   LV e' medial:    5.98 cm/s LV PW:         1.18  cm   LV E/e' medial:  9.5 LV IVS:        1.10 cm   LV e' lateral:   9.03 cm/s LVOT diam:     2.00 cm   LV E/e' lateral: 6.3 LV SV:         45 LV SV Index:   20 LVOT Area:     3.14 cm  RIGHT VENTRICLE RV Basal diam:  3.00 cm RV Mid diam:    3.20 cm RV S prime:     14.90 cm/s TAPSE (M-mode): 2.2 cm LEFT ATRIUM         Index       RIGHT ATRIUM          Index LA diam:    2.60 cm 1.18 cm/m  RA Area:     9.76 cm                                 RA Volume:   19.40 ml 8.82 ml/m  AORTIC VALVE AV Area (Vmax):    1.70 cm AV Area (Vmean):   1.60 cm AV Area (VTI):     1.71 cm AV Vmax:           153.33 cm/s AV Vmean:          102.033 cm/s AV VTI:            0.263 m AV Peak Grad:      9.4 mmHg AV Mean Grad:      5.0 mmHg LVOT Vmax:         83.00 cm/s LVOT Vmean:        52.100 cm/s LVOT VTI:          0.143 m LVOT/AV VTI ratio: 0.54  AORTA Ao Root diam: 3.00 cm MITRAL VALVE MV Area (PHT): 7.16 cm     SHUNTS MV Decel Time: 106 msec     Systemic VTI:  0.14 m MV E velocity: 57.10 cm/s   Systemic Diam: 2.00 cm MV A velocity: 104.00 cm/s MV E/A ratio:  0.55 Cristal Deer End MD Electronically signed by Yvonne Kendall MD Signature Date/Time: 11/23/2022/1:06:46 PM    Final    CT ANGIO HEAD NECK W WO CM W PERF (CODE STROKE)  Result Date: 11/23/2022 CLINICAL DATA:  Difficulty speaking.  Acute infarct by head CT. EXAM: CT ANGIOGRAPHY HEAD AND NECK CT PERFUSION BRAIN TECHNIQUE: Multidetector CT imaging of the head and neck was performed using the standard protocol during bolus  administration of intravenous contrast. Multiplanar CT image reconstructions and MIPs were obtained to evaluate the vascular anatomy. Carotid stenosis measurements (when applicable) are obtained utilizing NASCET criteria, using the distal internal carotid diameter as the denominator. Multiphase CT imaging of the brain was performed following IV bolus contrast injection. Subsequent parametric perfusion maps were calculated using RAPID software. RADIATION DOSE REDUCTION: This exam was performed according to the departmental dose-optimization program which includes automated exposure control, adjustment of the mA and/or kV according to patient size and/or use of iterative reconstruction technique. CONTRAST:  OMNIPAQUE IOHEXOL 350 MG/ML SOLN COMPARISON:  Head CT from earlier today FINDINGS: CTA NECK FINDINGS Aortic arch: 2 vessel branching.  No acute finding or dilatation Right carotid system: Moderate calcified plaque deposition at the bifurcation and proximal ICA without flow reducing stenosis or ulceration Left carotid system: Moderate mixed density plaque at the bifurcation and proximal ICA with primarily low-density component causing proximal  ICA stenosis of at least 75%, patent lumen measuring down to 1 mm. Vertebral arteries: Calcified plaque at the left vertebral origin. No subclavian stenosis. Both vertebral arteries are smoothly contoured and widely patent to the dura. Skeleton: No acute finding Other neck: No acute finding Upper chest: Clear apical lungs Review of the MIP images confirms the above findings CTA HEAD FINDINGS Anterior circulation: Atheromatous calcification which is confluent along the carotid siphons. No major branch occlusion, proximal flow limiting stenosis, beading, or aneurysm. Posterior circulation: Calcified plaque on the V4 segments. The vertebral and basilar arteries are widely patent. No branch occlusion, beading, or aneurysm Venous sinuses: Unremarkable for arterial timing  Anatomic variants: None significant Review of the MIP images confirms the above findings CT Brain Perfusion Findings: ASPECTS: 8 CBF (<30%) Volume: 0mL Perfusion (Tmax>6.0s) volume: 0mL The left frontal infarct is highlighted only as mildly delayed perfusion, likely due to vessel reopening. IMPRESSION: 1. No emergent large vessel occlusion. 2. 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 3. No penumbra by CT perfusion. Electronically Signed   By: Tiburcio Pea M.D.   On: 11/23/2022 04:50   CT HEAD CODE STROKE WO CONTRAST  Result Date: 11/23/2022 CLINICAL DATA:  Code stroke.  Aphasia EXAM: CT HEAD WITHOUT CONTRAST TECHNIQUE: Contiguous axial images were obtained from the base of the skull through the vertex without intravenous contrast. RADIATION DOSE REDUCTION: This exam was performed according to the departmental dose-optimization program which includes automated exposure control, adjustment of the mA and/or kV according to patient size and/or use of iterative reconstruction technique. COMPARISON:  None Available. FINDINGS: Brain: Cytotoxic edema in the upper left insula and lateral frontal cortex. No hemorrhage, hydrocephalus, or masslike finding Vascular: No hyperdense vessel or unexpected calcification. Skull: Normal. Negative for fracture or focal lesion. Sinuses/Orbits: No acute finding. Other: These results were called by telephone at the time of interpretation on 11/23/2022 at 4:06 am to provider [facility] , who verbally acknowledged these results. ASPECTS [facility] Stroke Program Early CT Score) - Ganglionic level infarction (caudate, lentiform nuclei, internal capsule, insula, M1-M3 cortex): 6 - Supraganglionic infarction (M4-M6 cortex): 2 Total score (0-10 with 10 being normal): 8 IMPRESSION: 1. Acute left MCA branch infarct.  ASPECTS is 8. 2. No intracranial hemorrhage. Electronically Signed   By: Tiburcio Pea M.D.   On: 11/23/2022 04:07   MM 3D SCREENING  MAMMOGRAM BILATERAL BREAST  Result Date: 11/02/2022 CLINICAL DATA:  Screening. EXAM: DIGITAL SCREENING BILATERAL MAMMOGRAM WITH TOMOSYNTHESIS AND CAD TECHNIQUE: Bilateral screening digital craniocaudal and mediolateral oblique mammograms were obtained. Bilateral screening digital breast tomosynthesis was performed. The images were evaluated with computer-aided detection. COMPARISON:  Previous exam(s). ACR Breast Density Category b: There are scattered areas of fibroglandular density. FINDINGS: There are no findings suspicious for malignancy. IMPRESSION: No mammographic evidence of malignancy. A result letter of this screening mammogram will be mailed directly to the patient. RECOMMENDATION: Screening mammogram in one year. (Code:[id number]-01Y) BI-RADS CATEGORY  1: Negative. Electronically Signed   By: Sherian Rein M.D.   On: 11/02/2022 13:41      Labs: BNP (last 3 results) No results for input(s): "BNP" in the last 8760 hours. Basic Metabolic Panel: Recent Labs  Lab 11/23/22 0400 11/24/22 0512  NA 129* 133*  K 3.9 4.4  CL 99 106  CO2 20* 21*  GLUCOSE 159* 163*  BUN 37* 27*  CREATININE 1.26* 1.21*  CALCIUM 8.5* 9.0   Liver Function Tests: Recent Labs  Lab 11/23/22 0400  AST 17  ALT 19  ALKPHOS 131*  BILITOT 0.7  PROT 7.0  ALBUMIN 3.9   No results for input(s): "LIPASE", "AMYLASE" in the last 168 hours. No results for input(s): "AMMONIA" in the last 168 hours. CBC: Recent Labs  Lab 11/23/22 0400 11/24/22 0512  WBC 12.5* 9.2  NEUTROABS 10.3*  --   HGB 12.4 11.8*  HCT 37.8 35.7*  MCV 87.3 86.4  PLT 198 178   Cardiac Enzymes: No results for input(s): "CKTOTAL", "CKMB", "CKMBINDEX", "TROPONINI" in the last 168 hours. BNP: Invalid input(s): "POCBNP" CBG: Recent Labs  Lab 11/23/22 1630 11/23/22 1944 11/24/22 0747 11/24/22 1127 11/24/22 1536  GLUCAP 135* 126* 164* 162* 147*   D-Dimer No results for input(s): "DDIMER" in the last 72 hours. Hgb A1c Recent Labs     11/23/22 0400  HGBA1C 6.4*   Lipid Profile Recent Labs    11/24/22 0512  CHOL 128  HDL 48  LDLCALC 56  TRIG 121  CHOLHDL 2.7   Thyroid function studies No results for input(s): "TSH", "T4TOTAL", "T3FREE", "THYROIDAB" in the last 72 hours.  Invalid input(s): "FREET3" Anemia work up No results for input(s): "VITAMINB12", "FOLATE", "FERRITIN", "TIBC", "IRON", "RETICCTPCT" in the last 72 hours. Urinalysis    Component Value Date/Time   COLORURINE YELLOW (A) 11/23/2022 0621   APPEARANCEUR TURBID (A) 11/23/2022 0621   LABSPEC 1.025 11/23/2022 0621   PHURINE 5.0 11/23/2022 0621   GLUCOSEU NEGATIVE 11/23/2022 0621   HGBUR SMALL (A) 11/23/2022 0621   BILIRUBINUR NEGATIVE 11/23/2022 0621   KETONESUR NEGATIVE 11/23/2022 0621   PROTEINUR 100 (A) 11/23/2022 0621   NITRITE NEGATIVE 11/23/2022 0621   LEUKOCYTESUR LARGE (A) 11/23/2022 0621   Sepsis Labs Recent Labs  Lab 11/23/22 0400 11/24/22 0512  WBC 12.5* 9.2   Microbiology No results found for this or any previous visit (from the past 240 hour(s)).   Total time spend on discharging this patient, including the last patient exam, discussing the hospital stay, instructions for ongoing care as it relates to all pertinent caregivers, as well as preparing the medical discharge records, prescriptions, and/or referrals as applicable, is 40 minutes.    Darlin Priestly, MD  [facility] 11/24/2022, 3:49 PM

## 2022-12-07 ENCOUNTER — Telehealth (INDEPENDENT_AMBULATORY_CARE_PROVIDER_SITE_OTHER): Payer: Self-pay

## 2022-12-07 NOTE — Telephone Encounter (Signed)
Spoke with the patient and informed her of the status of her prior authorization. The authorization is still pending review.

## 2022-12-15 ENCOUNTER — Other Ambulatory Visit: Payer: Self-pay | Admitting: *Deleted

## 2022-12-15 DIAGNOSIS — I6522 Occlusion and stenosis of left carotid artery: Secondary | ICD-10-CM

## 2022-12-22 ENCOUNTER — Ambulatory Visit (INDEPENDENT_AMBULATORY_CARE_PROVIDER_SITE_OTHER): Payer: PRIVATE HEALTH INSURANCE | Admitting: Vascular Surgery

## 2022-12-22 ENCOUNTER — Encounter: Payer: Self-pay | Admitting: Vascular Surgery

## 2022-12-22 ENCOUNTER — Ambulatory Visit (HOSPITAL_COMMUNITY)
Admission: RE | Admit: 2022-12-22 | Discharge: 2022-12-22 | Disposition: A | Discharge status: Home / Self Care | Payer: PRIVATE HEALTH INSURANCE | Source: Ambulatory Visit | Attending: Vascular Surgery | Admitting: Vascular Surgery

## 2022-12-22 ENCOUNTER — Other Ambulatory Visit: Payer: Self-pay

## 2022-12-22 VITALS — BP 133/83 | HR 92 | Temp 97.8°F | Resp 20 | Ht 67.0 in | Wt 218.0 lb

## 2022-12-22 DIAGNOSIS — I6522 Occlusion and stenosis of left carotid artery: Secondary | ICD-10-CM | POA: Diagnosis present

## 2022-12-22 DIAGNOSIS — I63239 Cerebral infarction due to unspecified occlusion or stenosis of unspecified carotid arteries: Secondary | ICD-10-CM | POA: Diagnosis not present

## 2022-12-22 NOTE — Progress Notes (Signed)
Patient ID: Rebecca Park, female   DOB: [birth year], 62 y.o.   MRN: [id number]  Reason for Consult: New Patient (Initial Visit)   Referred by Patrice Paradise, MD  Subjective:     HPI:  Rebecca Park is a 62 y.o. female with history of left below-knee amputation walks with the help of a cane and a left-sided prosthetic.  She was recently admitted at [facility] found to have left MCA stroke with difficulty speaking.  This is mostly returned to baseline she still does have some difficulty finding words.  She denies any previous history of stroke.  She is now on aspirin, Plavix and Lipitor was previously not on any of these medications.  She has not had any recurrent symptoms.  Past Medical History:  Diagnosis Date   Diabetes mellitus without complication (HCC)    Hypertension    PONV (postoperative nausea and vomiting)    the day after rt 5th toe amputation surgery   Family History  Problem Relation Age of Onset   Heart disease Father    Heart attack Father    Heart disease Maternal Grandfather    Breast cancer Neg Hx    Past Surgical History:  Procedure Laterality Date   AMPUTATION Right 11/10/2018   Procedure: AMPUTATION RAY (amputation 5th metatarsal Right foot;  Surgeon: Rosetta Posner, DPM;  Location: [facility] ORS;  Service: Podiatry;  Laterality: Right;   AMPUTATION Left 03/15/2021   Procedure: AMPUTATION BELOW KNEE;  Surgeon: Learta Codding, MD;  Location: [facility] OR[facility];  Service: Vascular;  Laterality: Left;   AMPUTATION TOE Right 12/05/2018   Procedure: RAY RIGHT 4TH AMPUTATION;  Surgeon: Rosetta Posner, DPM;  Location: [facility];  Service: Podiatry;  Laterality: Right;  mac with local DIABETIC-oral meds   BONE BIOPSY Right 11/10/2018   Procedure: BONE BIOPSY- Right 4th toe;  Surgeon: Rosetta Posner, DPM;  Location: [facility];  Service: Podiatry;  Laterality: Right;   BUNIONECTOMY Bilateral    COLONOSCOPY WITH PROPOFOL N/A 09/07/2018   Procedure:  COLONOSCOPY WITH PROPOFOL;  Surgeon: Toney Reil, MD;  Location: [facility];  Service: Gastroenterology;  Laterality: N/A;   COLONOSCOPY WITH PROPOFOL N/A 09/17/2019   Procedure: COLONOSCOPY WITH PROPOFOL;  Surgeon: Toney Reil, MD;  Location: [facility] ENDOSCOPY;  Service: Gastroenterology;  Laterality: N/A;   TEE WITHOUT CARDIOVERSION N/A 03/18/2021   Procedure: TRANSESOPHAGEAL ECHOCARDIOGRAM (TEE);  Surgeon: Antonieta Iba, MD;  Location: [facility] ORS;  Service: Cardiovascular;  Laterality: N/A;    Short Social History:  Social History   Tobacco Use   Smoking status: Never   Smokeless tobacco: Never  Substance Use Topics   Alcohol use: Not Currently    Comment: recently quit 01/2018, 3-4 beers QD    No Known Allergies  Current Outpatient Medications  Medication Sig Dispense Refill   amLODipine (NORVASC) 5 MG tablet Take 5 mg by mouth daily.     aspirin EC 81 MG tablet Take 1 tablet (81 mg total) by mouth daily. Swallow whole. 30 tablet 1   atorvastatin (LIPITOR) 40 MG tablet Take 1 tablet (40 mg total) by mouth daily. 30 tablet 2   clopidogrel (PLAVIX) 75 MG tablet Take 1 tablet (75 mg total) by mouth daily. 30 tablet 1   glipiZIDE (GLUCOTROL) 10 MG tablet Take 10 mg by mouth daily before breakfast.     losartan-hydrochlorothiazide (HYZAAR) 100-12.5 MG tablet Take 1 tablet by mouth daily.     metoprolol succinate (TOPROL-XL) 25 MG 24  hr tablet Take 1 tablet by mouth daily.     MOUNJARO 7.5 MG/0.5ML Pen Inject 7.5 mg into the skin once a week.     pantoprazole (PROTONIX) 40 MG tablet Take 1 tablet (40 mg total) by mouth 2 (two) times daily. 30 tablet 0   No current facility-administered medications for this visit.    Review of Systems  Constitutional:  Constitutional negative. HENT: HENT negative.  Eyes: Eyes negative.  Cardiovascular: Cardiovascular negative.  GI: Gastrointestinal negative.  Musculoskeletal:       Previous LLE amputation Skin: Skin negative.   Neurological: Positive for speech difficulty.  Hematologic: Hematologic/lymphatic negative.  Psychiatric: Psychiatric negative.        Objective:  Objective   Vitals:   12/22/22 1114 12/22/22 1117  BP: 134/80 133/83  Pulse: 92   Resp: 20   Temp: 97.8 F (36.6 C)   SpO2: 96%   Weight: 218 lb (98.9 kg)   Height: 5\' 7"  (1.702 m)    Body mass index is 34.14 kg/m.  Physical Exam HENT:     Mouth/Throat:     Mouth: Mucous membranes are moist.  Eyes:     Pupils: Pupils are equal, round, and reactive to light.  Neck:     Vascular: No carotid bruit.  Cardiovascular:     Rate and Rhythm: Normal rate.     Pulses: Normal pulses.     Heart sounds: No murmur heard. Pulmonary:     Effort: Pulmonary effort is normal.  Abdominal:     General: Abdomen is flat.     Palpations: Abdomen is soft.  Musculoskeletal:     Cervical back: Neck supple.     Comments: L BKA prosthesis  Skin:    General: Skin is warm.  Neurological:     General: No focal deficit present.     Mental Status: She is alert.  Psychiatric:        Mood and Affect: Mood normal.     Data: CTA IMPRESSION: 1. No emergent large vessel occlusion. 2. 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 3. No penumbra by CT perfusion.       Assessment/Plan:    62 year old female recently admitted with symptomatic left ICA lesion with left MCA stroke from which she has mostly recovered.  She is now maintained on aspirin, Plavix and a statin.  We have reviewed her CT together today and discussed her options being medical therapy versus carotid stenting versus carotid endarterectomy.  Given the plaque morphology of the lesion I have recommended carotid endarterectomy.  We have discussed the risk including 1 to 2% risk of stroke as well as risk to her heart and cranial nerve injury and she demonstrates good understanding.  Will plan for left carotid endarterectomy in the very near future given  that her stroke was 1 month ago.     Maeola Harman MD [facility]

## 2022-12-22 NOTE — H&P (View-Only) (Signed)
Patient ID: Rebecca Park, female   DOB: [birth year], 62 y.o.   MRN: [id number]  Reason for Consult: New Patient (Initial Visit)   Referred by Rebecca Paradise, MD  Subjective:     HPI:  [name] is a 62 y.o. female with history of left below-knee amputation walks with the help of a cane and a left-sided prosthetic.  She was recently admitted at [facility] found to have left MCA stroke with difficulty speaking.  This is mostly returned to baseline she still does have some difficulty finding words.  She denies any previous history of stroke.  She is now on aspirin, Plavix and Lipitor was previously not on any of these medications.  She has not had any recurrent symptoms.  Past Medical History:  Diagnosis Date   Diabetes mellitus without complication (HCC)    Hypertension    PONV (postoperative nausea and vomiting)    the day after rt 5th toe amputation surgery   Family History  Problem Relation Age of Onset   Heart disease Father    Heart attack Father    Heart disease Maternal Grandfather    Breast cancer Neg Hx    Past Surgical History:  Procedure Laterality Date   AMPUTATION Right 11/10/2018   Procedure: AMPUTATION RAY (amputation 5th metatarsal Right foot;  Surgeon: Rebecca Park, DPM;  Location: [facility];  Service: Podiatry;  Laterality: Right;   AMPUTATION Left 03/15/2021   Procedure: AMPUTATION BELOW KNEE;  Surgeon: Rebecca Codding, MD;  Location: [facility];  Service: Vascular;  Laterality: Left;   AMPUTATION TOE Right 12/05/2018   Procedure: RAY RIGHT 4TH AMPUTATION;  Surgeon: Rebecca Park, DPM;  Location: [facility];  Service: Podiatry;  Laterality: Right;  mac with local DIABETIC-oral meds   BONE BIOPSY Right 11/10/2018   Procedure: BONE BIOPSY- Right 4th toe;  Surgeon: Rebecca Park, DPM;  Location: [facility];  Service: Podiatry;  Laterality: Right;   BUNIONECTOMY Bilateral    COLONOSCOPY WITH PROPOFOL N/A 09/07/2018   Procedure:  COLONOSCOPY WITH PROPOFOL;  Surgeon: Rebecca Reil, MD;  Location: [facility] END[facility]COPY;  Service: Gastroenterology;  Laterality: N/A;   COLONOSCOPY WITH PROPOFOL N/A 09/17/2019   Procedure: COLONOSCOPY WITH PROPOFOL;  Surgeon: Rebecca Reil, MD;  Location: [facility] END[facility]COPY;  Service: Gastroenterology;  Laterality: N/A;   TEE WITHOUT CARDIOVERSION N/A 03/18/2021   Procedure: TRANSESOPHAGEAL ECHOCARDIOGRAM (TEE);  Surgeon: Rebecca Iba, MD;  Location: [facility];  Service: Cardiovascular;  Laterality: N/A;    Short Social History:  Social History   Tobacco Use   Smoking status: Never   Smokeless tobacco: Never  Substance Use Topics   Alcohol use: Not Currently    Comment: recently quit 01/2018, 3-4 beers QD    No Known Allergies  Current Outpatient Medications  Medication Sig Dispense Refill   amLODipine (NORVASC) 5 MG tablet Take 5 mg by mouth daily.     aspirin EC 81 MG tablet Take 1 tablet (81 mg total) by mouth daily. Swallow whole. 30 tablet 1   atorvastatin (LIPITOR) 40 MG tablet Take 1 tablet (40 mg total) by mouth daily. 30 tablet 2   clopidogrel (PLAVIX) 75 MG tablet Take 1 tablet (75 mg total) by mouth daily. 30 tablet 1   glipiZIDE (GLUCOTROL) 10 MG tablet Take 10 mg by mouth daily before breakfast.     losartan-hydrochlorothiazide (HYZAAR) 100-12.5 MG tablet Take 1 tablet by mouth daily.     metoprolol succinate (TOPROL-XL) 25 MG 24  hr tablet Take 1 tablet by mouth daily.     MOUNJARO 7.5 MG/0.5ML Pen Inject 7.5 mg into the skin once a week.     pantoprazole (PROTONIX) 40 MG tablet Take 1 tablet (40 mg total) by mouth 2 (two) times daily. 30 tablet 0   No current facility-administered medications for this visit.    Review of Systems  Constitutional:  Constitutional negative. HENT: HENT negative.  Eyes: Eyes negative.  Cardiovascular: Cardiovascular negative.  GI: Gastrointestinal negative.  Musculoskeletal:       Previous LLE amputation Skin: Skin negative.   Neurological: Positive for speech difficulty.  Hematologic: Hematologic/lymphatic negative.  Psychiatric: Psychiatric negative.        Objective:  Objective   Vitals:   12/22/22 1114 12/22/22 1117  BP: 134/80 133/83  Pulse: 92   Resp: 20   Temp: 97.8 F (36.6 C)   SpO2: 96%   Weight: 218 lb (98.9 kg)   Height: 5\' 7"  (1.702 m)    Body mass index is 34.14 kg/m.  Physical Exam HENT:     Mouth/Throat:     Mouth: Mucous membranes are moist.  Eyes:     Pupils: Pupils are equal, round, and reactive to light.  Neck:     Vascular: No carotid bruit.  Cardiovascular:     Rate and Rhythm: Normal rate.     Pulses: Normal pulses.     Heart sounds: No murmur heard. Pulmonary:     Effort: Pulmonary effort is normal.  Abdominal:     General: Abdomen is flat.     Palpations: Abdomen is soft.  Musculoskeletal:     Cervical back: Neck supple.     Comments: L BKA prosthesis  Skin:    General: Skin is warm.  Neurological:     General: No focal deficit present.     Mental Status: She is alert.  Psychiatric:        Mood and Affect: Mood normal.     Data: CTA IMPRESSION: 1. No emergent large vessel occlusion. 2. 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 3. No penumbra by CT perfusion.       Assessment/Plan:    62 year old female recently admitted with symptomatic left ICA lesion with left MCA stroke from which she has mostly recovered.  She is now maintained on aspirin, Plavix and a statin.  We have reviewed her CT together today and discussed her options being medical therapy versus carotid stenting versus carotid endarterectomy.  Given the plaque morphology of the lesion I have recommended carotid endarterectomy.  We have discussed the risk including 1 to 2% risk of stroke as well as risk to her heart and cranial nerve injury and she demonstrates good understanding.  Will plan for left carotid endarterectomy in the very near future given  that her stroke was 1 month ago.     Rebecca Harman MD [facility]

## 2022-12-24 NOTE — Progress Notes (Signed)
Surgical Instructions   Your procedure is scheduled on December 31, 2022. Report to [facility] Main Entrance "A" at 05:30 A.M., then check in with the Admitting office. Any questions or running late day of surgery: call [phone]  Questions prior to your surgery date: call [phone], Monday-Friday, 8am-4pm. If you experience any cold or flu symptoms such as cough, fever, chills, shortness of breath, etc. between now and your scheduled surgery, please notify us at the above number.     Remember:  Do not eat or drink after midnight the night before your surgery   Take these medicines the morning of surgery with A SIP OF WATER   amLODipine (NORVASC)  aspirin EC  atorvastatin (LIPITOR)  clopidogrel (PLAVIX)   WHAT DO I DO ABOUT MY DIABETES MEDICATION?   Do not take your GlipiZide (GLUCOTROL) the morning of surgery.  Follow your surgeon's direction's on when to stop taking MOUNJARO.  Last dose on 12/19/22.    HOW TO MANAGE YOUR DIABETES BEFORE AND AFTER SURGERY  Why is it important to control my blood sugar before and after surgery? Improving blood sugar levels before and after surgery helps healing and can limit problems. A way of improving blood sugar control is eating a healthy diet by:  Eating less sugar and carbohydrates  Increasing activity/exercise  Talking with your doctor about reaching your blood sugar goals High blood sugars (greater than 180 mg/dL) can raise your risk of infections and slow your recovery, so you will need to focus on controlling your diabetes during the weeks before surgery. Make sure that the doctor who takes care of your diabetes knows about your planned surgery including the date and location.  How do I manage my blood sugar before surgery? Check your blood sugar at least 4 times a day, starting 2 days before surgery, to make sure that the level is not too high or low.  Check your blood sugar the morning of your surgery when you wake up and  every 2 hours until you get to the Short Stay unit.  If your blood sugar is less than 70 mg/dL, you will need to treat for low blood sugar: Do not take insulin. Treat a low blood sugar (less than 70 mg/dL) with  cup of clear juice (cranberry or apple), 4 glucose tablets, OR glucose gel. Recheck blood sugar in 15 minutes after treatment (to make sure it is greater than 70 mg/dL). If your blood sugar is not greater than 70 mg/dL on recheck, call [phone] for further instructions. Report your blood sugar to the short stay nurse when you get to Short Stay.  If you are admitted to the hospital after surgery: Your blood sugar will be checked by the staff and you will probably be given insulin after surgery (instead of oral diabetes medicines) to make sure you have good blood sugar levels. The goal for blood sugar control after surgery is 80-180 mg/dL.  One week prior to surgery, STOP taking any Aspirin (unless otherwise instructed by your surgeon) Aleve, Naproxen, Ibuprofen, Motrin, Advil, Goody's, BC's, all herbal medications, fish oil, and non-prescription vitamins.                     Do NOT Smoke (Tobacco/Vaping) for 24 hours prior to your procedure.  If you use a CPAP at night, you may bring your mask/headgear for your overnight stay.   You will be asked to remove any contacts, glasses, piercing's, hearing aid's, dentures/partials prior to surgery. Please  bring cases for these items if needed.    Patients discharged the day of surgery will not be allowed to drive home, and someone needs to stay with them for 24 hours.  SURGICAL WAITING ROOM VISITATION Patients may have no more than 2 support people in the waiting area - these visitors may rotate.   Pre-op nurse will coordinate an appropriate time for 1 ADULT support person, who may not rotate, to accompany patient in pre-op.  Children under the age of 45 must have an adult with them who is not the patient and must remain in the main  waiting area with an adult.  If the patient needs to stay at the hospital during part of their recovery, the visitor guidelines for inpatient rooms apply.  Please refer to the [facility] website for the visitor guidelines for any additional information.   If you received a COVID test during your pre-op visit  it is requested that you wear a mask when out in public, stay away from anyone that may not be feeling well and notify your surgeon if you develop symptoms. If you have been in contact with anyone that has tested positive in the last 10 days please notify you surgeon.      Pre-operative 5 CHG Bathing Instructions   You can play a key role in reducing the risk of infection after surgery. Your skin needs to be as free of germs as possible. You can reduce the number of germs on your skin by washing with CHG (chlorhexidine gluconate) soap before surgery. CHG is an antiseptic soap that kills germs and continues to kill germs even after washing.   DO NOT use if you have an allergy to chlorhexidine/CHG or antibacterial soaps. If your skin becomes reddened or irritated, stop using the CHG and notify one of our RNs at [phone].   Please shower with the CHG soap starting 4 days before surgery using the following schedule:     Please keep in mind the following:  DO NOT shave, including legs and underarms, starting the day of your first shower.   You may shave your face at any point before/day of surgery.  Place clean sheets on your bed the day you start using CHG soap. Use a clean washcloth (not used since being washed) for each shower. DO NOT sleep with pets once you start using the CHG.   CHG Shower Instructions:  Wash your face and private area with normal soap. If you choose to wash your hair, wash first with your normal shampoo.  After you use shampoo/soap, rinse your hair and body thoroughly to remove shampoo/soap residue.  Turn the water OFF and apply about 3 tablespoons (45 ml)  of CHG soap to a CLEAN washcloth.  Apply CHG soap ONLY FROM YOUR NECK DOWN TO YOUR TOES (washing for 3-5 minutes)  DO NOT use CHG soap on face, private areas, open wounds, or sores.  Pay special attention to the area where your surgery is being performed.  If you are having back surgery, having someone wash your back for you may be helpful. Wait 2 minutes after CHG soap is applied, then you may rinse off the CHG soap.  Pat dry with a clean towel  Put on clean clothes/pajamas   If you choose to wear lotion, please use ONLY the CHG-compatible lotions on the back of this paper.   Additional instructions for the day of surgery: DO NOT APPLY any lotions, deodorants, cologne, or perfumes.  Do not bring valuables to the hospital. [facility] is not responsible for any belongings/valuables. Do not wear nail polish, gel polish, artificial nails, or any other type of covering on natural nails (fingers and toes) Do not wear jewelry or makeup Put on clean/comfortable clothes.  Please brush your teeth.  Ask your nurse before applying any prescription medications to the skin.     CHG Compatible Lotions   Aveeno Moisturizing lotion  Cetaphil Moisturizing Cream  Cetaphil Moisturizing Lotion  Clairol Herbal Essence Moisturizing Lotion, Dry Skin  Clairol Herbal Essence Moisturizing Lotion, Extra Dry Skin  Clairol Herbal Essence Moisturizing Lotion, Normal Skin  Curel Age Defying Therapeutic Moisturizing Lotion with Alpha Hydroxy  Curel Extreme Care Body Lotion  Curel Soothing Hands Moisturizing Hand Lotion  Curel Therapeutic Moisturizing Cream, Fragrance-Free  Curel Therapeutic Moisturizing Lotion, Fragrance-Free  Curel Therapeutic Moisturizing Lotion, Original Formula  Eucerin Daily Replenishing Lotion  Eucerin Dry Skin Therapy Plus Alpha Hydroxy Crme  Eucerin Dry Skin Therapy Plus Alpha Hydroxy Lotion  Eucerin Original Crme  Eucerin Original Lotion  Eucerin Plus Crme Eucerin Plus  Lotion  Eucerin TriLipid Replenishing Lotion  Keri Anti-Bacterial Hand Lotion  Keri Deep Conditioning Original Lotion Dry Skin Formula Softly Scented  Keri Deep Conditioning Original Lotion, Fragrance Free Sensitive Skin Formula  Keri Lotion Fast Absorbing Fragrance Free Sensitive Skin Formula  Keri Lotion Fast Absorbing Softly Scented Dry Skin Formula  Keri Original Lotion  Keri Skin Renewal Lotion Keri Silky Smooth Lotion  Keri Silky Smooth Sensitive Skin Lotion  Nivea Body Creamy Conditioning Oil  Nivea Body Extra Enriched Lotion  Nivea Body Original Lotion  Nivea Body Sheer Moisturizing Lotion Nivea Crme  Nivea Skin Firming Lotion  NutraDerm 30 Skin Lotion  NutraDerm Skin Lotion  NutraDerm Therapeutic Skin Cream  NutraDerm Therapeutic Skin Lotion  ProShield Protective Hand Cream  Provon moisturizing lotion  Please read over the following fact sheets that you were given.

## 2022-12-27 ENCOUNTER — Encounter (HOSPITAL_COMMUNITY): Payer: Self-pay

## 2022-12-27 ENCOUNTER — Other Ambulatory Visit: Payer: Self-pay

## 2022-12-27 ENCOUNTER — Encounter (HOSPITAL_COMMUNITY)
Admission: RE | Admit: 2022-12-27 | Discharge: 2022-12-27 | Disposition: A | Discharge status: Home / Self Care | Payer: PRIVATE HEALTH INSURANCE | Source: Ambulatory Visit | Attending: Vascular Surgery | Admitting: Vascular Surgery

## 2022-12-27 VITALS — BP 117/55 | HR 93 | Temp 98.0°F | Resp 18 | Ht 67.0 in | Wt 225.6 lb

## 2022-12-27 DIAGNOSIS — Z01818 Encounter for other preprocedural examination: Secondary | ICD-10-CM | POA: Diagnosis present

## 2022-12-27 DIAGNOSIS — Z01812 Encounter for preprocedural laboratory examination: Secondary | ICD-10-CM | POA: Diagnosis not present

## 2022-12-27 DIAGNOSIS — I63239 Cerebral infarction due to unspecified occlusion or stenosis of unspecified carotid arteries: Secondary | ICD-10-CM

## 2022-12-27 DIAGNOSIS — Z1152 Encounter for screening for COVID-19: Secondary | ICD-10-CM | POA: Insufficient documentation

## 2022-12-27 LAB — COMPREHENSIVE METABOLIC PANEL
ALT: 29 U/L (ref 0–44)
AST: 25 U/L (ref 15–41)
Albumin: 3.7 g/dL (ref 3.5–5.0)
Alkaline Phosphatase: 148 U/L — ABNORMAL HIGH (ref 38–126)
Anion gap: 11 (ref 5–15)
BUN: 37 mg/dL — ABNORMAL HIGH (ref 8–23)
CO2: 22 mmol/L (ref 22–32)
Calcium: 9 mg/dL (ref 8.9–10.3)
Chloride: 104 mmol/L (ref 98–111)
Creatinine, Ser: 1.64 mg/dL — ABNORMAL HIGH (ref 0.44–1.00)
GFR, Estimated: 35 mL/min — ABNORMAL LOW (ref >60)
Glucose, Bld: 116 mg/dL — ABNORMAL HIGH (ref 70–99)
Potassium: 3.9 mmol/L (ref 3.5–5.1)
Sodium: 137 mmol/L (ref 135–145)
Total Bilirubin: 0.8 mg/dL (ref 0.3–1.2)
Total Protein: 7.2 g/dL (ref 6.5–8.1)

## 2022-12-27 LAB — TYPE AND SCREEN
ABO/RH(D): O POS
Antibody Screen: NEGATIVE

## 2022-12-27 LAB — PROTIME-INR
INR: 1.1 (ref 0.8–1.2)
Prothrombin Time: 14 s (ref 11.4–15.2)

## 2022-12-27 LAB — CBC
HCT: 38.7 % (ref 36.0–46.0)
Hemoglobin: 12.5 g/dL (ref 12.0–15.0)
MCH: 29.3 pg (ref 26.0–34.0)
MCHC: 32.3 g/dL (ref 30.0–36.0)
MCV: 90.6 fL (ref 80.0–100.0)
Platelets: 189 10*3/uL (ref 150–400)
RBC: 4.27 MIL/uL (ref 3.87–5.11)
RDW: 12.8 % (ref 11.5–15.5)
WBC: 11.2 10*3/uL — ABNORMAL HIGH (ref 4.0–10.5)
nRBC: 0 % (ref 0.0–0.2)

## 2022-12-27 LAB — SURGICAL PCR SCREEN
MRSA, PCR: NEGATIVE
Staphylococcus aureus: NEGATIVE

## 2022-12-27 LAB — APTT: aPTT: 30 s (ref 24–36)

## 2022-12-27 LAB — GLUCOSE, CAPILLARY: Glucose-Capillary: 126 mg/dL — ABNORMAL HIGH (ref 70–99)

## 2022-12-27 LAB — SARS CORONAVIRUS 2 (TAT 6-24 HRS): SARS Coronavirus 2: NEGATIVE

## 2022-12-27 NOTE — Progress Notes (Signed)
PCP - Dr. Maurine Minister Cardiologist - Denies  PPM/ICD - DENIES Device Orders - N/A Rep Notified - N/A  Chest x-ray - N/A EKG - 11/23/22 Stress Test - DENIES ECHO - 11/23/22 Cardiac Cath - DENIES  Sleep Study - DENIES CPAP - N/A  Fasting Blood Sugar - 126-146 Checks Blood Sugar __0___ times a day. PATIENT CHECKS 3x/WEEK  Last dose of GLP1 agonist-  MOUNJARO LAST DOSE TAKEN ON 12/19/22 GLP1 instructions: Y  Blood Thinner Instructions: Y FROM PHYSICIAN Aspirin Instructions:Y FOLLOW HER PHYSICIAN INSTRUCTIONS  ERAS Protcol - NPO PRE-SURGERY Ensure or G2- N/A  COVID TEST- Y, PHYSICIAN ORDERED   Anesthesia review: Y, had a prior stroke on 11/23/22, with slight aphasia but improving. Diabetic,   Patient denies shortness of breath, fever, cough and chest pain at PAT appointment. Patient states no history of respiratory issues in last 3 months.   All instructions explained to the patient, with a verbal understanding of the material. Patient agrees to go over the instructions while at home for a better understanding. Patient also instructed to self quarantine after being tested for COVID-19. The opportunity to ask questions was provided.

## 2022-12-28 NOTE — Progress Notes (Signed)
Anesthesia Chart Review:   Case: [id number] Date/Time: 12/31/22 0715   Procedure: ENDARTERECTOMY CAROTID (Left)   Anesthesia type: General   Pre-op diagnosis: Symptomatic carotid artery stenosis with infarction   [facility]: [facility] ROOM 16 / [facility]   Surgeons: Maeola Harman, MD       DISCUSSION: Patient is a 62 year old female scheduled for the above procedure.  History includes never smoker, postoperative N/V, HTN, DM2, CVA (11/23/22), osteomyelitis (s/p right 5th toe amputation, 11/10/18, right 4th toe 12/05/18; left BKA for LLE gangrene).   [facility] admission 11/23/22 - 11/24/22 for speech difficulty and diagnosed with acute left MCA branch CVA. Not felt to be a candidate for TNK due to low NISS score. CTA showed proximal left ICA 75% stenosis. She was started on ASA and Plavix with plan for out-patient vascular surgery evaluation for left carotid intervention. Statin continued.   She was evaluated by Dr. Randie Heinz on 12/22/22. He reviewed her CTA and discussed medical therapy versus carotid stenting versus carotid endarterectomy. Given the plaque morphology of the lesion he recommended carotid endarterectomy. Per posting, she is to continue ASA and Plavix.   A1c 6.4% on 11/23/22. Last Mounjaro dose reported as 12/19/22.   12/27/22 PAT labs showed Creatinine 1.64, previously in the 1.2-1.3 range during her CVA admission. She did have CTA then. Antihypertensives include amlodipine, Toprol, and losartan-hydrochlorothiazide. Results routed to Dr. Randie Heinz for preoperative recommendations and to see if he wanted renal function repeated prior to surgery versus following post-operatively.   Anesthesia team to evaluate on the day of surgery. 12/27/22 COVID-19 test negative.  (UPDATE 12/29/22 10:07 AM: She has symptomatic left carotid stenosis, so Dr. Randie Heinz plans to proceed as scheduled and will monitor renal function post-operatively.)    VS: BP (!) 117/55   Pulse 93   Temp 36.7 C   Resp 18   Ht 5'  7" (1.702 m)   Wt 102.3 kg   LMP  (Approximate) Comment: a few years ago  SpO2 100%   BMI 35.33 kg/m    PROVIDERS: Patrice Paradise, MD is listed as PCP [facility]). Next visit scheduled for 02/08/23 with labs on 02/01/23.    LABS: Preoperative labs noted. BUN 37, Creatinine 1.64, previously Creatinine ~ 1.2-1.3 and BUN 27-37 range in August 2024. A1c 6.4% on 11/23/22.  (all labs ordered are listed, but only abnormal results are displayed)  Labs Reviewed  GLUCOSE, CAPILLARY - Abnormal; Notable for the following components:      Result Value   Glucose-Capillary 126 (*)    All other components within normal limits  CBC - Abnormal; Notable for the following components:   WBC 11.2 (*)    All other components within normal limits  COMPREHENSIVE METABOLIC PANEL - Abnormal; Notable for the following components:   Glucose, Bld 116 (*)    BUN 37 (*)    Creatinine, Ser 1.64 (*)    Alkaline Phosphatase 148 (*)    GFR, Estimated 35 (*)    All other components within normal limits  SARS CORONAVIRUS 2 (TAT 6-24 HRS)  SURGICAL PCR SCREEN  PROTIME-INR  APTT  TYPE AND SCREEN    IMAGES: MRI Brain 11/23/22:  IMPRESSION: Acute left MCA infarct primarily involving the left frontal operculum and insula.  CT Perfusion Brain & CTA Head/Neck 11/23/22: IMPRESSION: 1. No emergent large vessel occlusion. 2. 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 3. No penumbra by CT perfusion.   EKG:  11/23/22: Sinus rhythm Probable inferior infarct, old Lateral leads are also involved Confirmed by UNCONFIRMED, DOCTOR ([id number]), editor Lonell Face [phone]) on 11/23/2022 8:29:18 AM   CV: US Carotid 12/22/22: Summary:  Right Carotid: Velocities in the right ICA are consistent with a 1-39% stenosis.  Left Carotid: Velocities in the left ICA are consistent with a 40-59% stenosis.  Vertebrals: Bilateral vertebral arteries demonstrate antegrade flow.   Subclavians: Normal flow hemodynamics were seen in bilateral subclavian arteries.   Echo 11/23/22: IMPRESSIONS   1. Left ventricular ejection fraction, by estimation, is >55%. The left  ventricle has normal function. Left ventricular endocardial border not  optimally defined to evaluate regional wall motion. There is mild left  ventricular hypertrophy. Indeterminate  diastolic filling due to E-A fusion.   2. Right ventricular systolic function is normal. The right ventricular  size is normal. Tricuspid regurgitation signal is inadequate for assessing  PA pressure.   3. The mitral valve is degenerative. Trivial mitral valve regurgitation.  No evidence of mitral stenosis.   4. The aortic valve was not well visualized. There is mild calcification  of the aortic valve. There is mild thickening of the aortic valve. Aortic  valve regurgitation is not visualized. Aortic valve  sclerosis/calcification is present, without any  evidence of aortic stenosis.   5. The inferior vena cava is normal in size with greater than 50%  respiratory variability, suggesting right atrial pressure of 3 mmHg.   6. Bubble study is nondiagnostic due to suboptimal acoustic windows.     Past Medical History:  Diagnosis Date   Diabetes mellitus without complication (HCC)    Hypertension    PONV (postoperative nausea and vomiting)    the day after rt 5th toe amputation surgery   Stroke (HCC) 11/23/2022    Past Surgical History:  Procedure Laterality Date   AMPUTATION Right 11/10/2018   Procedure: AMPUTATION RAY (amputation 5th metatarsal Right foot;  Surgeon: Rosetta Posner, DPM;  Location: [facility] ORS;  Service: Podiatry;  Laterality: Right;   AMPUTATION Left 03/15/2021   Procedure: AMPUTATION BELOW KNEE;  Surgeon: Learta Codding, MD;  Location: [facility] ORS;  Service: Vascular;  Laterality: Left;   AMPUTATION TOE Right 12/05/2018   Procedure: RAY RIGHT 4TH AMPUTATION;  Surgeon: Rosetta Posner, DPM;  Location: [facility];  Service: Podiatry;  Laterality: Right;  mac with local DIABETIC-oral meds   BONE BIOPSY Right 11/10/2018   Procedure: BONE BIOPSY- Right 4th toe;  Surgeon: Rosetta Posner, DPM;  Location: [facility] ORS;  Service: Podiatry;  Laterality: Right;   BUNIONECTOMY Bilateral    COLONOSCOPY WITH PROPOFOL N/A 09/07/2018   Procedure: COLONOSCOPY WITH PROPOFOL;  Surgeon: Toney Reil, MD;  Location[facility] ENDOSCOPY;  Service: Gastroenterology;  Laterality: N/A;   COLONOSCOPY WITH PROPOFOL N/A 09/17/2019   Procedure: COLONOSCOPY WITH PROPOFOL;  Surgeon: Toney Reil, MD;  Location[facility] END[facility]COPY;  Service: Gastroenterology;  Laterality: N/A;   TEE WITHOUT CARDIOVERSION N/A 03/18/2021   Procedure: TRANSESOPHAGEAL ECHOCARDIOGRAM (TEE);  Surgeon: Antonieta Iba, MD;  Location: [facility] ORS;  Service: Cardiovascular;  Laterality: N/A;    MEDICATIONS:  amLODipine (NORVASC) 5 MG tablet   aspirin EC 81 MG tablet   atorvastatin (LIPITOR) 40 MG tablet   clopidogrel (PLAVIX) 75 MG tablet   glipiZIDE (GLUCOTROL) 10 MG tablet   losartan-hydrochlorothiazide (HYZAAR) 100-12.5 MG tablet   metoprolol succinate (TOPROL-XL) 25 MG 24 hr tablet   MOUNJARO 7.5 MG/0.5ML Pen   pantoprazole (PROTONIX) 40 MG tablet   No current facility-administered  medications for this encounter.    Shonna Chock, PA-C Surgical Short Stay/Anesthesiology [facility] Phone [phone] [facility] Phone [phone] 12/28/2022 5:53 PM

## 2022-12-28 NOTE — Anesthesia Preprocedure Evaluation (Addendum)
Anesthesia Evaluation  Patient identified by MRN, date of birth, ID band Patient awake    Reviewed: Allergy & Precautions, NPO status , Patient's Chart, lab work & pertinent test results  History of Anesthesia Complications (+) PONV and history of anesthetic complications  Airway Mallampati: III  TM Distance: >3 FB Neck ROM: Full    Dental  (+) Dental Advisory Given   Pulmonary neg pulmonary ROS   Pulmonary exam normal        Cardiovascular hypertension, Pt. on medications and Pt. on home beta blockers + Peripheral Vascular Disease  Normal cardiovascular exam   '24 Carotid US - 40-59% left ICAS, 1-39% right ICAS  '24 TTE - EF >55%. There is mild left ventricular hypertrophy. Trivial mitral valve regurgitation.   '24 CT Angio Neck - ~75% stenosis in proximal left ICA    Neuro/Psych CVA, No Residual Symptoms  negative psych ROS   GI/Hepatic Neg liver ROS,GERD  Medicated and Controlled,,  Endo/Other  diabetes, Type 2, Oral Hypoglycemic Agents  Morbid obesity  Renal/GU Renal InsufficiencyRenal disease     Musculoskeletal negative musculoskeletal ROS (+)    Abdominal   Peds  Hematology negative hematology ROS (+)   Anesthesia Other Findings On GLP-1a, last dose >7 days ago   Reproductive/Obstetrics                              Anesthesia Physical Anesthesia Plan  ASA: 4  Anesthesia Plan: General   Post-op Pain Management: Tylenol PO (pre-op)*   Induction: Intravenous  PONV Risk Score and Plan: 4 or greater and Treatment may vary due to age or medical condition, Ondansetron, Propofol infusion, Dexamethasone and Midazolam  Airway Management Planned: Oral ETT  Additional Equipment: Arterial line  Intra-op Plan:   Post-operative Plan: Extubation in OR  Informed Consent: I have reviewed the patients History and Physical, chart, labs and discussed the procedure including the  risks, benefits and alternatives for the proposed anesthesia with the patient or authorized representative who has indicated his/her understanding and acceptance.     Dental advisory given  Plan Discussed with: CRNA and Anesthesiologist  Anesthesia Plan Comments: (See PAT note )        Anesthesia Quick Evaluation

## 2022-12-31 ENCOUNTER — Encounter (HOSPITAL_COMMUNITY): Payer: Self-pay | Admitting: Vascular Surgery

## 2022-12-31 ENCOUNTER — Inpatient Hospital Stay (HOSPITAL_COMMUNITY)
Admission: RE | Admit: 2022-12-31 | Discharge: 2023-01-01 | DRG: 039 | Disposition: A | Discharge status: Home / Self Care | Payer: PRIVATE HEALTH INSURANCE | Source: Ambulatory Visit | Attending: Vascular Surgery | Admitting: Vascular Surgery

## 2022-12-31 ENCOUNTER — Encounter (HOSPITAL_COMMUNITY)
Admission: RE | Disposition: A | Discharge status: Home / Self Care | Payer: Self-pay | Source: Ambulatory Visit | Attending: Vascular Surgery

## 2022-12-31 ENCOUNTER — Other Ambulatory Visit: Payer: Self-pay

## 2022-12-31 ENCOUNTER — Inpatient Hospital Stay (HOSPITAL_COMMUNITY): Payer: PRIVATE HEALTH INSURANCE | Admitting: Certified Registered"

## 2022-12-31 ENCOUNTER — Inpatient Hospital Stay (HOSPITAL_COMMUNITY): Payer: PRIVATE HEALTH INSURANCE | Admitting: Vascular Surgery

## 2022-12-31 DIAGNOSIS — Z8673 Personal history of transient ischemic attack (TIA), and cerebral infarction without residual deficits: Secondary | ICD-10-CM

## 2022-12-31 DIAGNOSIS — I6522 Occlusion and stenosis of left carotid artery: Secondary | ICD-10-CM

## 2022-12-31 DIAGNOSIS — Z89512 Acquired absence of left leg below knee: Secondary | ICD-10-CM | POA: Diagnosis not present

## 2022-12-31 DIAGNOSIS — Z7902 Long term (current) use of antithrombotics/antiplatelets: Secondary | ICD-10-CM | POA: Diagnosis not present

## 2022-12-31 DIAGNOSIS — Z79899 Other long term (current) drug therapy: Secondary | ICD-10-CM

## 2022-12-31 DIAGNOSIS — I10 Essential (primary) hypertension: Secondary | ICD-10-CM | POA: Diagnosis present

## 2022-12-31 DIAGNOSIS — Z6834 Body mass index (BMI) 34.0-34.9, adult: Secondary | ICD-10-CM

## 2022-12-31 DIAGNOSIS — Z8249 Family history of ischemic heart disease and other diseases of the circulatory system: Secondary | ICD-10-CM | POA: Diagnosis not present

## 2022-12-31 DIAGNOSIS — Z7982 Long term (current) use of aspirin: Secondary | ICD-10-CM | POA: Diagnosis not present

## 2022-12-31 DIAGNOSIS — E66811 Obesity, class 1: Secondary | ICD-10-CM | POA: Diagnosis present

## 2022-12-31 DIAGNOSIS — Z7984 Long term (current) use of oral hypoglycemic drugs: Secondary | ICD-10-CM

## 2022-12-31 DIAGNOSIS — I6529 Occlusion and stenosis of unspecified carotid artery: Principal | ICD-10-CM | POA: Diagnosis present

## 2022-12-31 DIAGNOSIS — E119 Type 2 diabetes mellitus without complications: Secondary | ICD-10-CM

## 2022-12-31 DIAGNOSIS — E785 Hyperlipidemia, unspecified: Secondary | ICD-10-CM | POA: Diagnosis present

## 2022-12-31 DIAGNOSIS — Z89421 Acquired absence of other right toe(s): Secondary | ICD-10-CM

## 2022-12-31 DIAGNOSIS — Z01818 Encounter for other preprocedural examination: Secondary | ICD-10-CM

## 2022-12-31 DIAGNOSIS — Z7985 Long-term (current) use of injectable non-insulin antidiabetic drugs: Secondary | ICD-10-CM

## 2022-12-31 HISTORY — PX: ENDARTERECTOMY: SHX5162

## 2022-12-31 LAB — GLUCOSE, CAPILLARY
Glucose-Capillary: 122 mg/dL — ABNORMAL HIGH (ref 70–99)
Glucose-Capillary: 123 mg/dL — ABNORMAL HIGH (ref 70–99)

## 2022-12-31 LAB — CBC
HCT: 33.8 % — ABNORMAL LOW (ref 36.0–46.0)
Hemoglobin: 11 g/dL — ABNORMAL LOW (ref 12.0–15.0)
MCH: 29.2 pg (ref 26.0–34.0)
MCHC: 32.5 g/dL (ref 30.0–36.0)
MCV: 89.7 fL (ref 80.0–100.0)
Platelets: 150 10*3/uL (ref 150–400)
RBC: 3.77 MIL/uL — ABNORMAL LOW (ref 3.87–5.11)
RDW: 13 % (ref 11.5–15.5)
WBC: 11.4 10*3/uL — ABNORMAL HIGH (ref 4.0–10.5)
nRBC: 0 % (ref 0.0–0.2)

## 2022-12-31 LAB — CREATININE, SERUM
Creatinine, Ser: 1.34 mg/dL — ABNORMAL HIGH (ref 0.44–1.00)
GFR, Estimated: 45 mL/min — ABNORMAL LOW (ref >60)

## 2022-12-31 LAB — ABO/RH: ABO/RH(D): O POS

## 2022-12-31 LAB — POCT ACTIVATED CLOTTING TIME
Activated Clotting Time: 152 s
Activated Clotting Time: 385 s

## 2022-12-31 SURGERY — ENDARTERECTOMY, CAROTID
Anesthesia: General | Site: Neck | Laterality: Left

## 2022-12-31 MED ORDER — HEPARIN SODIUM (PORCINE) 5000 UNIT/ML IJ SOLN
5000.0000 [IU] | Freq: Three times a day (TID) | INTRAMUSCULAR | Status: DC
  Administered 2023-01-01: 5000 [IU] via SUBCUTANEOUS
  Filled 2022-12-31: qty 1

## 2022-12-31 MED ORDER — ONDANSETRON HCL 4 MG/2ML IJ SOLN
INTRAMUSCULAR | Status: AC
  Filled 2022-12-31: qty 2

## 2022-12-31 MED ORDER — GUAIFENESIN-DM 100-10 MG/5ML PO SYRP: 15.0000 mL | ORAL_SOLUTION | ORAL | Status: DC | PRN

## 2022-12-31 MED ORDER — AMLODIPINE BESYLATE 5 MG PO TABS
5.0000 mg | ORAL_TABLET | Freq: Every day | ORAL | Status: DC
  Administered 2023-01-01: 5 mg via ORAL
  Filled 2022-12-31: qty 1

## 2022-12-31 MED ORDER — PROTAMINE SULFATE 10 MG/ML IV SOLN
INTRAVENOUS | Status: AC
  Filled 2022-12-31: qty 5

## 2022-12-31 MED ORDER — DOCUSATE SODIUM 100 MG PO CAPS
100.0000 mg | ORAL_CAPSULE | Freq: Every day | ORAL | Status: DC
  Administered 2023-01-01: 100 mg via ORAL
  Filled 2022-12-31: qty 1

## 2022-12-31 MED ORDER — LOSARTAN POTASSIUM 50 MG PO TABS
100.0000 mg | ORAL_TABLET | Freq: Every day | ORAL | Status: DC
  Administered 2022-12-31 – 2023-01-01 (×2): 100 mg via ORAL
  Filled 2022-12-31 (×3): qty 2

## 2022-12-31 MED ORDER — PROPOFOL 10 MG/ML IV BOLUS
INTRAVENOUS | Status: AC
  Filled 2022-12-31: qty 20

## 2022-12-31 MED ORDER — CEFAZOLIN SODIUM-DEXTROSE 2-4 GM/100ML-% IV SOLN
2.0000 g | INTRAVENOUS | Status: AC
  Administered 2022-12-31: 2 g via INTRAVENOUS
  Filled 2022-12-31: qty 100

## 2022-12-31 MED ORDER — FENTANYL CITRATE (PF) 100 MCG/2ML IJ SOLN: 25.0000 ug | INTRAMUSCULAR | Status: DC | PRN

## 2022-12-31 MED ORDER — OXYCODONE HCL 5 MG PO TABS
5.0000 mg | ORAL_TABLET | Freq: Once | ORAL | Status: AC | PRN
  Administered 2022-12-31: 5 mg via ORAL

## 2022-12-31 MED ORDER — ORAL CARE MOUTH RINSE: 15.0000 mL | Freq: Once | OROMUCOSAL | Status: AC

## 2022-12-31 MED ORDER — SODIUM CHLORIDE 0.9 % IV SOLN: INTRAVENOUS | Status: DC

## 2022-12-31 MED ORDER — POTASSIUM CHLORIDE CRYS ER 20 MEQ PO TBCR: 20.0000 meq | EXTENDED_RELEASE_TABLET | Freq: Every day | ORAL | Status: DC | PRN

## 2022-12-31 MED ORDER — DEXAMETHASONE SODIUM PHOSPHATE 10 MG/ML IJ SOLN
INTRAMUSCULAR | Status: AC
  Filled 2022-12-31: qty 1

## 2022-12-31 MED ORDER — SUGAMMADEX SODIUM 200 MG/2ML IV SOLN
INTRAVENOUS | Status: DC | PRN
  Administered 2022-12-31: 200 mg via INTRAVENOUS

## 2022-12-31 MED ORDER — PROTAMINE SULFATE 10 MG/ML IV SOLN
INTRAVENOUS | Status: DC | PRN
Start: 2022-12-31 — End: 2022-12-31
  Administered 2022-12-31: 50 mg via INTRAVENOUS

## 2022-12-31 MED ORDER — LABETALOL HCL 5 MG/ML IV SOLN
INTRAVENOUS | Status: DC | PRN
Start: 2022-12-31 — End: 2022-12-31
  Administered 2022-12-31: 10 mg via INTRAVENOUS

## 2022-12-31 MED ORDER — METOPROLOL SUCCINATE ER 25 MG PO TB24
25.0000 mg | ORAL_TABLET | Freq: Every day | ORAL | Status: DC
  Administered 2022-12-31 – 2023-01-01 (×2): 25 mg via ORAL
  Filled 2022-12-31 (×2): qty 1

## 2022-12-31 MED ORDER — MAGNESIUM SULFATE 2 GM/50ML IV SOLN: 2.0000 g | Freq: Every day | INTRAVENOUS | Status: DC | PRN

## 2022-12-31 MED ORDER — METOPROLOL TARTRATE 5 MG/5ML IV SOLN: 2.0000 mg | INTRAVENOUS | Status: DC | PRN

## 2022-12-31 MED ORDER — GLIPIZIDE 10 MG PO TABS
10.0000 mg | ORAL_TABLET | Freq: Two times a day (BID) | ORAL | Status: DC
  Administered 2022-12-31 – 2023-01-01 (×2): 10 mg via ORAL
  Filled 2022-12-31 (×2): qty 1

## 2022-12-31 MED ORDER — DEXAMETHASONE SODIUM PHOSPHATE 10 MG/ML IJ SOLN
INTRAMUSCULAR | Status: DC | PRN
  Administered 2022-12-31: 10 mg via INTRAVENOUS

## 2022-12-31 MED ORDER — HYDROCHLOROTHIAZIDE 25 MG PO TABS
25.0000 mg | ORAL_TABLET | Freq: Every day | ORAL | Status: DC
  Administered 2022-12-31 – 2023-01-01 (×2): 25 mg via ORAL
  Filled 2022-12-31 (×2): qty 1

## 2022-12-31 MED ORDER — ACETAMINOPHEN 325 MG PO TABS
325.0000 mg | ORAL_TABLET | ORAL | Status: DC | PRN
  Administered 2023-01-01: 650 mg via ORAL
  Filled 2022-12-31: qty 2

## 2022-12-31 MED ORDER — CHLORHEXIDINE GLUCONATE 0.12 % MT SOLN
15.0000 mL | Freq: Once | OROMUCOSAL | Status: AC
  Administered 2022-12-31: 15 mL via OROMUCOSAL
  Filled 2022-12-31: qty 15

## 2022-12-31 MED ORDER — BISACODYL 5 MG PO TBEC: 5.0000 mg | DELAYED_RELEASE_TABLET | Freq: Every day | ORAL | Status: DC | PRN

## 2022-12-31 MED ORDER — PANTOPRAZOLE SODIUM 40 MG PO TBEC
40.0000 mg | DELAYED_RELEASE_TABLET | Freq: Two times a day (BID) | ORAL | Status: DC
  Administered 2022-12-31 – 2023-01-01 (×3): 40 mg via ORAL
  Filled 2022-12-31 (×3): qty 1

## 2022-12-31 MED ORDER — PROPOFOL 10 MG/ML IV BOLUS
INTRAVENOUS | Status: DC | PRN
  Administered 2022-12-31: 20 mg via INTRAVENOUS
  Administered 2022-12-31: 100 mg via INTRAVENOUS

## 2022-12-31 MED ORDER — EPHEDRINE 5 MG/ML INJ
INTRAVENOUS | Status: AC
  Filled 2022-12-31: qty 5

## 2022-12-31 MED ORDER — SODIUM CHLORIDE 0.9 % IV SOLN: 500.0000 mL | Freq: Once | INTRAVENOUS | Status: DC | PRN

## 2022-12-31 MED ORDER — OXYCODONE HCL 5 MG PO TABS
ORAL_TABLET | ORAL | Status: AC
  Filled 2022-12-31: qty 1

## 2022-12-31 MED ORDER — SUCCINYLCHOLINE CHLORIDE 200 MG/10ML IV SOSY
PREFILLED_SYRINGE | INTRAVENOUS | Status: AC
  Filled 2022-12-31: qty 10

## 2022-12-31 MED ORDER — ACETAMINOPHEN 500 MG PO TABS
1000.0000 mg | ORAL_TABLET | Freq: Once | ORAL | Status: AC
  Administered 2022-12-31: 1000 mg via ORAL
  Filled 2022-12-31: qty 2

## 2022-12-31 MED ORDER — ONDANSETRON HCL 4 MG/2ML IJ SOLN
INTRAMUSCULAR | Status: DC | PRN
  Administered 2022-12-31: 4 mg via INTRAVENOUS

## 2022-12-31 MED ORDER — ASPIRIN 81 MG PO TBEC
81.0000 mg | DELAYED_RELEASE_TABLET | Freq: Every day | ORAL | Status: DC
  Administered 2023-01-01: 81 mg via ORAL
  Filled 2022-12-31: qty 1

## 2022-12-31 MED ORDER — CEFAZOLIN SODIUM-DEXTROSE 2-4 GM/100ML-% IV SOLN
2.0000 g | Freq: Three times a day (TID) | INTRAVENOUS | Status: AC
  Administered 2022-12-31 (×2): 2 g via INTRAVENOUS
  Filled 2022-12-31 (×2): qty 100

## 2022-12-31 MED ORDER — PHENOL 1.4 % MT LIQD: 1.0000 | OROMUCOSAL | Status: DC | PRN

## 2022-12-31 MED ORDER — HEPARIN 6000 UNIT IRRIGATION SOLUTION
Status: AC
  Filled 2022-12-31: qty 500

## 2022-12-31 MED ORDER — LACTATED RINGERS IV SOLN: INTRAVENOUS | Status: DC

## 2022-12-31 MED ORDER — NALOXONE HCL 0.4 MG/ML IJ SOLN
INTRAMUSCULAR | Status: DC | PRN
Start: 2022-12-31 — End: 2022-12-31
  Administered 2022-12-31: 40 ug via INTRAVENOUS

## 2022-12-31 MED ORDER — CHLORHEXIDINE GLUCONATE CLOTH 2 % EX PADS: 6.0000 | MEDICATED_PAD | Freq: Once | CUTANEOUS | Status: DC

## 2022-12-31 MED ORDER — DEXMEDETOMIDINE HCL IN NACL 80 MCG/20ML IV SOLN
INTRAVENOUS | Status: AC
  Filled 2022-12-31: qty 20

## 2022-12-31 MED ORDER — ROCURONIUM BROMIDE 10 MG/ML (PF) SYRINGE
PREFILLED_SYRINGE | INTRAVENOUS | Status: DC | PRN
  Administered 2022-12-31: 20 mg via INTRAVENOUS
  Administered 2022-12-31: 50 mg via INTRAVENOUS

## 2022-12-31 MED ORDER — HYDRALAZINE HCL 20 MG/ML IJ SOLN
INTRAMUSCULAR | Status: DC | PRN
Start: 2022-12-31 — End: 2022-12-31
  Administered 2022-12-31: 10 mg via INTRAVENOUS

## 2022-12-31 MED ORDER — ONDANSETRON HCL 4 MG/2ML IJ SOLN: 4.0000 mg | Freq: Four times a day (QID) | INTRAMUSCULAR | Status: DC | PRN

## 2022-12-31 MED ORDER — LIDOCAINE 2% (20 MG/ML) 5 ML SYRINGE
INTRAMUSCULAR | Status: AC
  Filled 2022-12-31: qty 5

## 2022-12-31 MED ORDER — ALUM & MAG HYDROXIDE-SIMETH 200-200-20 MG/5ML PO SUSP: 15.0000 mL | ORAL | Status: DC | PRN

## 2022-12-31 MED ORDER — HYDRALAZINE HCL 20 MG/ML IJ SOLN: 5.0000 mg | INTRAMUSCULAR | Status: DC | PRN

## 2022-12-31 MED ORDER — FENTANYL CITRATE (PF) 250 MCG/5ML IJ SOLN
INTRAMUSCULAR | Status: DC | PRN
  Administered 2022-12-31: 200 ug via INTRAVENOUS
  Administered 2022-12-31: 50 ug via INTRAVENOUS

## 2022-12-31 MED ORDER — PHENYLEPHRINE 80 MCG/ML (10ML) SYRINGE FOR IV PUSH (FOR BLOOD PRESSURE SUPPORT)
PREFILLED_SYRINGE | INTRAVENOUS | Status: AC
  Filled 2022-12-31: qty 10

## 2022-12-31 MED ORDER — OXYCODONE HCL 5 MG PO TABS
5.0000 mg | ORAL_TABLET | ORAL | Status: DC | PRN
  Administered 2022-12-31: 10 mg via ORAL
  Filled 2022-12-31: qty 2

## 2022-12-31 MED ORDER — HYDRALAZINE HCL 20 MG/ML IJ SOLN
INTRAMUSCULAR | Status: DC | PRN
Start: 2022-12-31 — End: 2022-12-31

## 2022-12-31 MED ORDER — 0.9 % SODIUM CHLORIDE (POUR BTL) OPTIME
TOPICAL | Status: DC | PRN
  Administered 2022-12-31: 1000 mL

## 2022-12-31 MED ORDER — OXYCODONE HCL 5 MG/5ML PO SOLN: 5.0000 mg | Freq: Once | ORAL | Status: AC | PRN

## 2022-12-31 MED ORDER — PHENYLEPHRINE 80 MCG/ML (10ML) SYRINGE FOR IV PUSH (FOR BLOOD PRESSURE SUPPORT)
PREFILLED_SYRINGE | INTRAVENOUS | Status: DC | PRN
  Administered 2022-12-31: 80 ug via INTRAVENOUS

## 2022-12-31 MED ORDER — PHENYLEPHRINE HCL-NACL 20-0.9 MG/250ML-% IV SOLN
INTRAVENOUS | Status: DC | PRN
  Administered 2022-12-31: 30 ug/min via INTRAVENOUS

## 2022-12-31 MED ORDER — SENNOSIDES-DOCUSATE SODIUM 8.6-50 MG PO TABS: 1.0000 | ORAL_TABLET | Freq: Every evening | ORAL | Status: DC | PRN

## 2022-12-31 MED ORDER — HEPARIN 6000 UNIT IRRIGATION SOLUTION
Status: DC | PRN
  Administered 2022-12-31: 1

## 2022-12-31 MED ORDER — FENTANYL CITRATE (PF) 250 MCG/5ML IJ SOLN
INTRAMUSCULAR | Status: AC
  Filled 2022-12-31: qty 5

## 2022-12-31 MED ORDER — LABETALOL HCL 5 MG/ML IV SOLN: 10.0000 mg | INTRAVENOUS | Status: DC | PRN

## 2022-12-31 MED ORDER — LOSARTAN POTASSIUM-HCTZ 100-12.5 MG PO TABS: 1.0000 | ORAL_TABLET | Freq: Every day | ORAL | Status: DC

## 2022-12-31 MED ORDER — ROCURONIUM BROMIDE 10 MG/ML (PF) SYRINGE
PREFILLED_SYRINGE | INTRAVENOUS | Status: AC
  Filled 2022-12-31: qty 10

## 2022-12-31 MED ORDER — MORPHINE SULFATE (PF) 2 MG/ML IV SOLN: 2.0000 mg | INTRAVENOUS | Status: DC | PRN

## 2022-12-31 MED ORDER — HEPARIN SODIUM (PORCINE) 1000 UNIT/ML IJ SOLN
INTRAMUSCULAR | Status: DC | PRN
Start: 2022-12-31 — End: 2022-12-31
  Administered 2022-12-31: 10000 [IU] via INTRAVENOUS

## 2022-12-31 MED ORDER — LIDOCAINE 2% (20 MG/ML) 5 ML SYRINGE
INTRAMUSCULAR | Status: DC | PRN
  Administered 2022-12-31: 50 mg via INTRAVENOUS

## 2022-12-31 MED ORDER — LIDOCAINE HCL (PF) 1 % IJ SOLN
INTRAMUSCULAR | Status: AC
  Filled 2022-12-31: qty 30

## 2022-12-31 MED ORDER — HEMOSTATIC AGENTS (NO CHARGE) OPTIME
TOPICAL | Status: DC | PRN
  Administered 2022-12-31: 1 via TOPICAL

## 2022-12-31 MED ORDER — EPHEDRINE SULFATE-NACL 50-0.9 MG/10ML-% IV SOSY
PREFILLED_SYRINGE | INTRAVENOUS | Status: DC | PRN
  Administered 2022-12-31: 10 mg via INTRAVENOUS

## 2022-12-31 MED ORDER — ACETAMINOPHEN 650 MG RE SUPP: 325.0000 mg | RECTAL | Status: DC | PRN

## 2022-12-31 MED ORDER — ATORVASTATIN CALCIUM 40 MG PO TABS
40.0000 mg | ORAL_TABLET | Freq: Every day | ORAL | Status: DC
  Administered 2023-01-01: 40 mg via ORAL
  Filled 2022-12-31: qty 1

## 2022-12-31 SURGICAL SUPPLY — 51 items
ADH SKN CLS APL DERMABOND .7 (GAUZE/BANDAGES/DRESSINGS) ×1
BAG COUNTER SPONGE SURGICOUNT (BAG) ×1 IMPLANT
BAG DECANTER FOR FLEXI CONT (MISCELLANEOUS) ×1 IMPLANT
BAG SPNG CNTER NS LX DISP (BAG) ×1
CANISTER SUCT 3000ML PPV (MISCELLANEOUS) ×1 IMPLANT
CATH ROBINSON RED A/P 18FR (CATHETERS) ×1 IMPLANT
CLIP LIGATING EXTRA MED SLVR (CLIP) ×1 IMPLANT
CLIP LIGATING EXTRA SM BLUE (MISCELLANEOUS) ×1 IMPLANT
COVER PROBE W GEL 5X96 (DRAPES) ×1 IMPLANT
DERMABOND ADVANCED .7 DNX12 (GAUZE/BANDAGES/DRESSINGS) ×1 IMPLANT
ELECT REM PT RETURN 9FT ADLT (ELECTROSURGICAL) ×1 IMPLANT
ELECTRODE REM PT RTRN 9FT ADLT (ELECTROSURGICAL) ×1 IMPLANT
GLOVE BIO SURGEON STRL SZ7.5 (GLOVE) ×1 IMPLANT
GLOVE INDICATOR 7.0 STRL GRN (GLOVE) IMPLANT
GLOVE SS BIOGEL STRL SZ 6.5 (GLOVE) IMPLANT
GOWN STRL REUS W/ TWL LRG LVL3 (GOWN DISPOSABLE) ×2 IMPLANT
GOWN STRL REUS W/ TWL XL LVL3 (GOWN DISPOSABLE) ×1 IMPLANT
GOWN STRL REUS W/TWL LRG LVL3 (GOWN DISPOSABLE) ×2
GOWN STRL REUS W/TWL XL LVL3 (GOWN DISPOSABLE) ×1
HEMOSTAT SNOW SURGICEL 2X4 (HEMOSTASIS) IMPLANT
INSERT FOGARTY SM (MISCELLANEOUS) IMPLANT
IV ADAPTER SYR DOUBLE MALE LL (MISCELLANEOUS) IMPLANT
IV ADPR TBG 2 MALE LL ART (MISCELLANEOUS)
KIT BASIN OR (CUSTOM PROCEDURE TRAY) ×1 IMPLANT
KIT SHUNT ARGYLE CAROTID ART 6 (VASCULAR PRODUCTS) ×1 IMPLANT
KIT TURNOVER KIT B (KITS) ×1 IMPLANT
LOOP VASCULAR MINI 18 RED (MISCELLANEOUS) ×1
NDL HYPO 25GX1X1/2 BEV (NEEDLE) IMPLANT
NDL SPNL 20GX3.5 QUINCKE YW (NEEDLE) IMPLANT
NEEDLE HYPO 25GX1X1/2 BEV (NEEDLE) IMPLANT
NEEDLE SPNL 20GX3.5 QUINCKE YW (NEEDLE) IMPLANT
NS IRRIG 1000ML POUR BTL (IV SOLUTION) ×3 IMPLANT
PACK CAROTID (CUSTOM PROCEDURE TRAY) ×1 IMPLANT
PAD ARMBOARD 7.5X6 YLW CONV (MISCELLANEOUS) ×2 IMPLANT
PATCH VASC XENOSURE 1X6 (Vascular Products) IMPLANT
POSITIONER HEAD DONUT 9IN (MISCELLANEOUS) ×1 IMPLANT
POWDER SURGICEL 3.0 GRAM (HEMOSTASIS) IMPLANT
STOPCOCK 4 WAY LG BORE MALE ST (IV SETS) IMPLANT
SUT ETHILON 3 0 PS 1 (SUTURE) IMPLANT
SUT MNCRL AB 4-0 PS2 18 (SUTURE) ×1 IMPLANT
SUT PROLENE 6 0 BV (SUTURE) ×1 IMPLANT
SUT SILK 3 0 (SUTURE)
SUT SILK 3-0 18XBRD TIE 12 (SUTURE) IMPLANT
SUT VIC AB 3-0 SH 27 (SUTURE) ×1
SUT VIC AB 3-0 SH 27X BRD (SUTURE) ×1 IMPLANT
SYR CONTROL 10ML LL (SYRINGE) IMPLANT
TOWEL GREEN STERILE (TOWEL DISPOSABLE) ×1 IMPLANT
TUBE CONNECTING 12X1/4 (SUCTIONS) IMPLANT
TUBING ART PRESS 48 MALE/FEM (TUBING) IMPLANT
VASCULAR TIE MINI RED 18IN STL (MISCELLANEOUS) IMPLANT
WATER STERILE IRR 1000ML POUR (IV SOLUTION) ×1 IMPLANT

## 2022-12-31 NOTE — Anesthesia Procedure Notes (Signed)
Arterial Line Insertion Start/End10/06/2022 8:08 AM, 12/31/2022 8:08 AM Performed by: Pincus Large, CRNA, CRNA  Preanesthetic checklist: patient identified, IV checked, site marked, risks and benefits discussed, surgical consent and monitors and equipment checked Lidocaine 1% used for infiltration and patient sedated Right, radial was placed Catheter size: 20 G Hand hygiene performed , maximum sterile barriers used  and Seldinger technique used Allen's test indicative of satisfactory collateral circulation Attempts: 2 Procedure performed using ultrasound guided technique. Ultrasound Notes:anatomy identified, needle tip was noted to be adjacent to the nerve/plexus identified and no ultrasound evidence of intravascular and/or intraneural injection Following insertion, dressing applied and Biopatch. Patient tolerated the procedure well with no immediate complications.

## 2022-12-31 NOTE — Anesthesia Procedure Notes (Addendum)
Procedure Name: Intubation Date/Time: 12/31/2022 7:41 AM  Performed by: Pincus Large, CRNAPre-anesthesia Checklist: Patient identified, Emergency Drugs available, Suction available and Patient being monitored Patient Re-evaluated:Patient Re-evaluated prior to induction Oxygen Delivery Method: Circle System Utilized Preoxygenation: Pre-oxygenation with 100% oxygen Induction Type: IV induction Ventilation: Mask ventilation without difficulty Laryngoscope Size: Miller and 2 Grade View: Grade II Tube type: Oral Tube size: 7.0 mm Number of attempts: 1 Airway Equipment and Method: Stylet and Oral airway Placement Confirmation: ETT inserted through vocal cords under direct vision, positive ETCO2 and breath sounds checked- equal and bilateral Secured at: 22 cm Tube secured with: Tape Dental Injury: Teeth and Oropharynx as per pre-operative assessment

## 2022-12-31 NOTE — Transfer of Care (Signed)
Immediate Anesthesia Transfer of Care Note  Patient: Rebecca Park  Procedure(s) Performed: ENDARTERECTOMY CAROTID Left (Left: Neck)  Patient Location: PACU  Anesthesia Type:General  Level of Consciousness: awake, alert , oriented, and sedated  Airway & Oxygen Therapy: Patient Spontanous Breathing and Patient connected to face mask oxygen  Post-op Assessment: Report given to RN and Post -op Vital signs reviewed and stable  Post vital signs: Reviewed and stable  Last Vitals:  Vitals Value Taken Time  BP 106/74   Temp 97.5   Pulse 81 12/31/22 0947  Resp 11 12/31/22 0947  SpO2 96 % 12/31/22 0947  Vitals shown include unfiled device data.  Last Pain:  Vitals:   12/31/22 0615  TempSrc: Oral  PainSc:          Complications: No notable events documented.

## 2022-12-31 NOTE — Interval H&P Note (Signed)
History and Physical Interval Note:  12/31/2022 7:21 AM  Rebecca Park  has presented today for surgery, with the diagnosis of Symptomatic carotid artery stenosis with infarction.  The various methods of treatment have been discussed with the patient and family. After consideration of risks, benefits and other options for treatment, the patient has consented to  Procedure(s): ENDARTERECTOMY CAROTID (Left) as a surgical intervention.  The patient's history has been reviewed, patient examined, no change in status, stable for surgery.  I have reviewed the patient's chart and labs.  Questions were answered to the patient's satisfaction.     Lemar Livings

## 2022-12-31 NOTE — Anesthesia Postprocedure Evaluation (Signed)
Anesthesia Post Note  Patient: Rebecca Park  Procedure(s) Performed: ENDARTERECTOMY CAROTID Left (Left: Neck)     Patient location during evaluation: PACU Anesthesia Type: General Level of consciousness: awake and alert Pain management: pain level controlled Vital Signs Assessment: post-procedure vital signs reviewed and stable Respiratory status: spontaneous breathing, nonlabored ventilation and respiratory function stable Cardiovascular status: stable and blood pressure returned to baseline Anesthetic complications: no   No notable events documented.  Last Vitals:  Vitals:   12/31/22 1100 12/31/22 1115  BP: (!) 92/52 (!) 97/54  Pulse: 86 88  Resp: 16 18  Temp:  (!) 36.2 C  SpO2: 94% 95%    Last Pain:  Vitals:   12/31/22 1115  TempSrc:   PainSc: 3                  Beryle Lathe

## 2022-12-31 NOTE — Plan of Care (Signed)
  Problem: Education: Goal: Knowledge of disease or condition will improve Outcome: Progressing Goal: Knowledge of secondary prevention will improve (MUST DOCUMENT ALL) Outcome: Progressing Goal: Knowledge of patient specific risk factors will improve Loraine Leriche N/A or DELETE if not current risk factor) Outcome: Progressing   Problem: Ischemic Stroke/TIA Tissue Perfusion: Goal: Complications of ischemic stroke/TIA will be minimized Outcome: Progressing   Problem: Coping: Goal: Will verbalize positive feelings about self Outcome: Progressing Goal: Will identify appropriate support needs Outcome: Progressing   Problem: Health Behavior/Discharge Planning: Goal: Ability to manage health-related needs will improve Outcome: Progressing Goal: Goals will be collaboratively established with patient/family Outcome: Progressing   Problem: Self-Care: Goal: Ability to participate in self-care as condition permits will improve Outcome: Progressing Goal: Verbalization of feelings and concerns over difficulty with self-care will improve Outcome: Progressing Goal: Ability to communicate needs accurately will improve Outcome: Progressing   Problem: Nutrition: Goal: Risk of aspiration will decrease Outcome: Progressing   Problem: Education: Goal: Knowledge of discharge needs will improve Outcome: Progressing   Problem: Clinical Measurements: Goal: Postoperative complications will be avoided or minimized Outcome: Progressing   Problem: Respiratory: Goal: Ability to achieve and maintain a regular respiratory rate will improve Outcome: Progressing   Problem: Skin Integrity: Goal: Demonstration of wound healing without infection will improve Outcome: Progressing

## 2022-12-31 NOTE — Discharge Instructions (Signed)
   [facility]  Discharge Instructions   Carotid Surgery  Please refer to the following instructions for your post-procedure care. Your surgeon or physician assistant will discuss any changes with you.  Activity  You are encouraged to walk as much as you can. You can slowly return to normal activities but must avoid strenuous activity and heavy lifting until your doctor tell you it's okay. Avoid activities such as vacuuming or swinging a golf club. You can drive after one week if you are comfortable and you are no longer taking prescription pain medications. It is normal to feel tired for serval weeks after your surgery. It is also normal to have difficulty with sleep habits, eating, and bowel movements after surgery. These will go away with time.  Bathing/Showering  Shower daily after you go home. Do not soak in a bathtub, hot tub, or swim until the incision heals completely.  Incision Care  Shower every day. Clean your incision with mild soap and water. Pat the area dry with a clean towel. You do not need a bandage unless otherwise instructed. Do not apply any ointments or creams to your incision. You may have skin glue on your incision. Do not peel it off. It will come off on its own in about one week. Your incision may feel thickened and raised for several weeks after your surgery. This is normal and the skin will soften over time.   For Men Only: It's okay to shave around the incision but do not shave the incision itself for 2 weeks. It is common to have numbness under your chin that could last for several months.  Diet  Resume your normal diet. There are no special food restrictions following this procedure. A low fat/low cholesterol diet is recommended for all patients with vascular disease. In order to heal from your surgery, it is CRITICAL to get adequate nutrition. Your body requires vitamins, minerals, and protein. Vegetables are the best source of  vitamins and minerals. Vegetables also provide the perfect balance of protein. Processed food has little nutritional value, so try to avoid this.  Medications  Resume taking all of your medications unless your doctor or physician assistant tells you not to. If your incision is causing pain, you may take over-the- counter pain relievers such as acetaminophen (Tylenol). If you were prescribed a stronger pain medication, please be aware these medications can cause nausea and constipation. Prevent nausea by taking the medication with a snack or meal. Avoid constipation by drinking plenty of fluids and eating foods with a high amount of fiber, such as fruits, vegetables, and grains.   Do not take Tylenol if you are taking prescription pain medications.  Follow Up  Our office will schedule a follow up appointment 2-3 weeks following discharge.  Please call us immediately for any of the following conditions  . Increased pain, redness, drainage (pus) from your incision site. . Fever of 101 degrees or higher. . If you should develop stroke (slurred speech, difficulty swallowing, weakness on one side of your body, loss of vision) you should call 911 and go to the nearest emergency room. .  Reduce your risk of vascular disease:  . Stop smoking. If you would like help call [facility] at 1-800-QUIT-NOW (1-800-784-8669) or [place] at 336-586-4000. . Manage your cholesterol . Maintain a desired weight . Control your diabetes . Keep your blood pressure down .  If you have any questions, please call the office at [phone]. 

## 2022-12-31 NOTE — Plan of Care (Signed)
Problem: Education: Goal: Knowledge of disease or condition will improve 12/31/2022 2223 by Orson Ape, RN Outcome: Progressing 12/31/2022 2222 by Orson Ape, RN Outcome: Progressing Goal: Knowledge of secondary prevention will improve (MUST DOCUMENT ALL) 12/31/2022 2223 by Orson Ape, RN Outcome: Progressing 12/31/2022 2222 by Orson Ape, RN Outcome: Progressing Goal: Knowledge of patient specific risk factors will improve Loraine Leriche N/A or DELETE if not current risk factor) 12/31/2022 2223 by Orson Ape, RN Outcome: Progressing 12/31/2022 2222 by Orson Ape, RN Outcome: Progressing   Problem: Ischemic Stroke/TIA Tissue Perfusion: Goal: Complications of ischemic stroke/TIA will be minimized 12/31/2022 2223 by Orson Ape, RN Outcome: Progressing 12/31/2022 2222 by Orson Ape, RN Outcome: Progressing   Problem: Coping: Goal: Will verbalize positive feelings about self 12/31/2022 2223 by Orson Ape, RN Outcome: Progressing 12/31/2022 2222 by Orson Ape, RN Outcome: Progressing Goal: Will identify appropriate support needs 12/31/2022 2223 by Orson Ape, RN Outcome: Progressing 12/31/2022 2222 by Orson Ape, RN Outcome: Progressing   Problem: Health Behavior/Discharge Planning: Goal: Ability to manage health-related needs will improve 12/31/2022 2223 by Orson Ape, RN Outcome: Progressing 12/31/2022 2222 by Orson Ape, RN Outcome: Progressing Goal: Goals will be collaboratively established with patient/family 12/31/2022 2223 by Orson Ape, RN Outcome: Progressing 12/31/2022 2222 by Orson Ape, RN Outcome: Progressing   Problem: Self-Care: Goal: Ability to participate in self-care as condition permits will improve 12/31/2022 2223 by Orson Ape, RN Outcome: Progressing 12/31/2022 2222 by Orson Ape, RN Outcome: Progressing Goal: Verbalization of feelings and  concerns over difficulty with self-care will improve 12/31/2022 2223 by Orson Ape, RN Outcome: Progressing 12/31/2022 2222 by Orson Ape, RN Outcome: Progressing Goal: Ability to communicate needs accurately will improve 12/31/2022 2223 by Orson Ape, RN Outcome: Progressing 12/31/2022 2222 by Orson Ape, RN Outcome: Progressing   Problem: Nutrition: Goal: Risk of aspiration will decrease 12/31/2022 2223 by Orson Ape, RN Outcome: Progressing 12/31/2022 2222 by Orson Ape, RN Outcome: Progressing Goal: Dietary intake will improve 12/31/2022 2223 by Orson Ape, RN Outcome: Progressing 12/31/2022 2222 by Orson Ape, RN Outcome: Progressing   Problem: Education: Goal: Knowledge of discharge needs will improve 12/31/2022 2223 by Orson Ape, RN Outcome: Progressing 12/31/2022 2222 by Orson Ape, RN Outcome: Progressing   Problem: Clinical Measurements: Goal: Postoperative complications will be avoided or minimized 12/31/2022 2223 by Orson Ape, RN Outcome: Progressing 12/31/2022 2222 by Orson Ape, RN Outcome: Progressing   Problem: Respiratory: Goal: Ability to achieve and maintain a regular respiratory rate will improve 12/31/2022 2223 by Orson Ape, RN Outcome: Progressing 12/31/2022 2222 by Orson Ape, RN Outcome: Progressing   Problem: Skin Integrity: Goal: Demonstration of wound healing without infection will improve 12/31/2022 2223 by Orson Ape, RN Outcome: Progressing 12/31/2022 2222 by Orson Ape, RN Outcome: Progressing   Problem: Education: Goal: Knowledge of General Education information will improve Description: Including pain rating scale, medication(s)/side effects and non-pharmacologic comfort measures Outcome: Progressing   Problem: Health Behavior/Discharge Planning: Goal: Ability to manage health-related needs will improve Outcome: Progressing    Problem: Clinical Measurements: Goal: Ability to maintain clinical measurements within normal limits will improve Outcome: Progressing Goal: Will remain free from infection Outcome: Progressing Goal: Diagnostic test results will improve Outcome: Progressing Goal: Respiratory complications will improve Outcome: Progressing Goal: Cardiovascular complication will be avoided Outcome: Progressing   Problem: Activity: Goal: Risk for activity intolerance will decrease Outcome: Progressing   Problem: Nutrition: Goal: Adequate nutrition will be maintained Outcome: Progressing   Problem: Coping: Goal: Level of anxiety will decrease Outcome: Progressing   Problem:  Elimination: Goal: Will not experience complications related to bowel motility Outcome: Progressing Goal: Will not experience complications related to urinary retention Outcome: Progressing   Problem: Pain Managment: Goal: General experience of comfort will improve Outcome: Progressing   Problem: Safety: Goal: Ability to remain free from injury will improve Outcome: Progressing   Problem: Skin Integrity: Goal: Risk for impaired skin integrity will decrease Outcome: Progressing

## 2022-12-31 NOTE — Op Note (Signed)
Patient name: Rebecca Park MRN: [id number] DOB: July 22, 1960 Sex: female  12/31/2022 Pre-operative Diagnosis: Symptomatic left stenosis Post-operative diagnosis:  Same Surgeon:  Apolinar Junes C. Randie Heinz, MD Assistant: Nathanial Rancher, PA Procedure Performed:  Left carotid endarterectomy with bovine pericardial patch angioplasty and 10 French shunt neuroprotection  Indications: 62 year old female with recent history of left cortical stroke that she has recovered from.  She remains on dual antiplatelet therapy and statin.  She is indicated for left carotid endarterectomy.   Given the complexity of the case,  the assistant was necessary in order to expedite the procedure and safely perform the technical aspects of the operation.  The assistant provided traction and countertraction to assist with exposure of the common carotid artery, external carotid artery, and internal carotid artery.  They also assisted with suture ligatures and dividing the facial vein and multiple small venous branches tethering the hypoglossal nerve. The assistant also played a critical role in placing the shunt safely.  In addition they were necessary to provide adequate traction and countertraction to perform a precise endarterectomy and precise closure.  These skills, especially following the Prolene suture for the anastomosis, could not have been adequately performed by a scrub tech assistant.    Findings: The carotid bifurcation was actually very low.  The carotid past the lesion was healthy.  10 French stent was placed as neuroprotection given the recent history of stroke.  At completion the patient was neurologically intact.   Procedure:  The patient was identified in the holding area and taken to the operating room where she was placed supine operative when general anesthesia was induced.  She was sterilely prepped and draped in the left neck in the usual fashion, antibiotics were administered a timeout was called.  We began with  incision along the anterior border of the sternocleidomastoid.  We dissected down through the skin subcutaneous tissue and the sternocleidomastoid was retracted laterally.  We identified the internal jugular vein and the common carotid artery.  The incision did have to be extended inferiorly as the lesion appeared to be quite low.  The facial vein was divided between ties.  The common carotid was identified and encircled with umbilical tape and the patient was fully heparinized and ACT returned greater than 300.  We dissected out prior identified the external carotid artery and encircled this as well as the superior thyroidal branch and the ICA much higher where it was healthy was also encircled with Vesseloops.  A bovine pericardial patch and 10 [place] shunt were prepared.  The blood pressure was a systolic of 140 ACT greater than 300 and the ICA was clamped followed by the common carotid artery and the the ECA and the superior thyroid branch.  The common carotid was entered sharply and arteriotomy extended onto the ICA where this was healthy.  The 10 [place] was placed distally allowed to backbleed and then placed proximally.  Flow was confirmed with Doppler.  Endarterectomy was performed including eversion of the external carotid artery.  When all of the endarterectomy site had been smooth we sewed a bovine pericardial patch in place.  Prior to completion we removed the shunt and flushed in all directions and thoroughly irrigated the carotid bulb with heparinized saline.  We then completed a patch angioplasty and then released the clamps on the external and superior thyroid branch followed by the common carotid artery and after several cardiac cycles the internal carotid artery.  Doppler confirmed expected below resistance signal in the distal ICA  with somewhat high resistance in the ECA.  We administered 50 mg of protamine and obtain hemostasis in the wound.  We did at the place a few repair stitches and our  patch.  After hemostasis was obtained we irrigated the wound and closed in layers the platysma with Vicryl and the skin with Monocryl.  Dermabond was placed at the skin level.  The patient was awakened from anesthesia and noted to be neurologically intact and transferred to recovery area in stable condition.  All counts were correct at completion.  EBL: 50cc   Maebelle Sulton C. Randie Heinz, MD [facility] [place] Office: [phone] Pager: [phone]

## 2023-01-01 LAB — BASIC METABOLIC PANEL
Anion gap: 11 (ref 5–15)
BUN: 29 mg/dL — ABNORMAL HIGH (ref 8–23)
CO2: 21 mmol/L — ABNORMAL LOW (ref 22–32)
Calcium: 8.7 mg/dL — ABNORMAL LOW (ref 8.9–10.3)
Chloride: 104 mmol/L (ref 98–111)
Creatinine, Ser: 1.09 mg/dL — ABNORMAL HIGH (ref 0.44–1.00)
GFR, Estimated: 57 mL/min — ABNORMAL LOW (ref >60)
Glucose, Bld: 216 mg/dL — ABNORMAL HIGH (ref 70–99)
Potassium: 4.5 mmol/L (ref 3.5–5.1)
Sodium: 136 mmol/L (ref 135–145)

## 2023-01-01 LAB — CBC
HCT: 33 % — ABNORMAL LOW (ref 36.0–46.0)
Hemoglobin: 11 g/dL — ABNORMAL LOW (ref 12.0–15.0)
MCH: 28.8 pg (ref 26.0–34.0)
MCHC: 33.3 g/dL (ref 30.0–36.0)
MCV: 86.4 fL (ref 80.0–100.0)
Platelets: 182 10*3/uL (ref 150–400)
RBC: 3.82 MIL/uL — ABNORMAL LOW (ref 3.87–5.11)
RDW: 12.9 % (ref 11.5–15.5)
WBC: 17.2 10*3/uL — ABNORMAL HIGH (ref 4.0–10.5)
nRBC: 0 % (ref 0.0–0.2)

## 2023-01-01 LAB — LIPID PANEL
Cholesterol: 114 mg/dL (ref 0–200)
HDL: 38 mg/dL — ABNORMAL LOW (ref >40)
LDL Cholesterol: 63 mg/dL (ref 0–99)
Total CHOL/HDL Ratio: 3 {ratio}
Triglycerides: 66 mg/dL (ref <150)
VLDL: 13 mg/dL (ref 0–40)

## 2023-01-01 MED ORDER — CLOPIDOGREL BISULFATE 75 MG PO TABS
75.0000 mg | ORAL_TABLET | Freq: Every day | ORAL | Status: DC
  Administered 2023-01-01: 75 mg via ORAL
  Filled 2023-01-01: qty 1

## 2023-01-01 NOTE — Progress Notes (Addendum)
  Progress Note    01/01/2023 8:15 AM 1 Day Post-Op  Subjective: Sitting up in bed eating breakfast.  Denies any pain.    Vitals:   01/01/23 0552 01/01/23 0754  BP: 125/70 124/66  Pulse: 86 88  Resp: 15 (!) 22  Temp:  98.1 F (36.7 C)  SpO2: 97% 97%    Physical Exam: General: Resting comfortably, alert and oriented x 4 Cardiac: Regular Lungs: Nonlabored Incisions: Left-sided neck incision clean, dry, and intact.  No hematoma Extremities: Palpable and equal radial pulses Neuro: No slurred speech, no numbness/weakness, no acute vision changes   CBC    Component Value Date/Time   WBC 17.2 (H) 01/01/2023 0405   RBC 3.82 (L) 01/01/2023 0405   HGB 11.0 (L) 01/01/2023 0405   HCT 33.0 (L) 01/01/2023 0405   PLT 182 01/01/2023 0405   MCV 86.4 01/01/2023 0405   MCH 28.8 01/01/2023 0405   MCHC 33.3 01/01/2023 0405   RDW 12.9 01/01/2023 0405   LYMPHSABS 1.3 11/23/2022 0400   MONOABS 0.6 11/23/2022 0400   EOSABS 0.2 11/23/2022 0400   BASOSABS 0.0 11/23/2022 0400    BMET    Component Value Date/Time   NA 136 01/01/2023 0405   K 4.5 01/01/2023 0405   CL 104 01/01/2023 0405   CO2 21 (L) 01/01/2023 0405   GLUCOSE 216 (H) 01/01/2023 0405   BUN 29 (H) 01/01/2023 0405   CREATININE 1.09 (H) 01/01/2023 0405   CREATININE 0.93 09/15/2018 1545   CALCIUM 8.7 (L) 01/01/2023 0405   GFRNONAA 57 (L) 01/01/2023 0405   GFRAA >60 11/11/2018 0356    INR    Component Value Date/Time   INR 1.1 12/27/2022 0950     Intake/Output Summary (Last 24 hours) at 01/01/2023 0815 Last data filed at 01/01/2023 0736 Gross per 24 hour  Intake 924.3 ml  Output 1950 ml  Net -1025.7 ml      Assessment/Plan:  62 y.o. female is 1 day postop, s/p: Left carotid endarterectomy   -The patient is doing well this morning.  She denies any pain at her incision site.  She had some pain overnight, but this was controlled with Tylenol -Left-sided neck incision is clean, dry, intact without  hematoma -The patient is neuro intact without slurred speech, weakness/numbness, or acute vision changes -She is voiding without difficulty.  She is eating a regular diet without difficulty swallowing. -Hemodynamically stable.  Postop hemoglobin at 11 -Will give her a dose of Plavix prior to discharge today.  She will continue her aspirin, Plavix, and statin daily. -Anticipate discharge today.  Will arrange follow-up with our office in 2 to 3 weeks for incision check   Loel Dubonnet PA-C [facility] [phone] 01/01/2023 8:15 AM   VASCULAR STAFF ADDENDUM: I have independently interviewed and examined the patient. I agree with the above.  Looks great postop day 1 after left CEA. Neurologically intact.  Cranial nerves intact.  Moving all extremities. Has tolerated regular diet that difficulty swallowing. Continue aspirin, Plavix, statin therapies. Follow-up with [place] in 2 to 3 weeks. Safe for discharge.  Rande Brunt. Lenell Antu, MD [facility] Phone Number: [phone] 01/01/2023 11:24 AM

## 2023-01-01 NOTE — Plan of Care (Signed)
  Problem: Education: Goal: Knowledge of disease or condition will improve Outcome: Progressing Goal: Knowledge of secondary prevention will improve (MUST DOCUMENT ALL) Outcome: Progressing Goal: Knowledge of patient specific risk factors will improve Loraine Leriche N/A or DELETE if not current risk factor) Outcome: Progressing   Problem: Ischemic Stroke/TIA Tissue Perfusion: Goal: Complications of ischemic stroke/TIA will be minimized Outcome: Progressing   Problem: Coping: Goal: Will verbalize positive feelings about self Outcome: Progressing Goal: Will identify appropriate support needs Outcome: Progressing   Problem: Health Behavior/Discharge Planning: Goal: Ability to manage health-related needs will improve Outcome: Progressing Goal: Goals will be collaboratively established with patient/family Outcome: Progressing   Problem: Self-Care: Goal: Ability to participate in self-care as condition permits will improve Outcome: Progressing Goal: Verbalization of feelings and concerns over difficulty with self-care will improve Outcome: Progressing Goal: Ability to communicate needs accurately will improve Outcome: Progressing   Problem: Nutrition: Goal: Risk of aspiration will decrease Outcome: Progressing Goal: Dietary intake will improve Outcome: Progressing   Problem: Education: Goal: Knowledge of discharge needs will improve Outcome: Progressing   Problem: Clinical Measurements: Goal: Postoperative complications will be avoided or minimized Outcome: Progressing   Problem: Respiratory: Goal: Ability to achieve and maintain a regular respiratory rate will improve Outcome: Progressing   Problem: Skin Integrity: Goal: Demonstration of wound healing without infection will improve Outcome: Progressing   Problem: Education: Goal: Knowledge of General Education information will improve Description: Including pain rating scale, medication(s)/side effects and non-pharmacologic  comfort measures Outcome: Progressing   Problem: Health Behavior/Discharge Planning: Goal: Ability to manage health-related needs will improve Outcome: Progressing   Problem: Clinical Measurements: Goal: Ability to maintain clinical measurements within normal limits will improve Outcome: Progressing Goal: Will remain free from infection Outcome: Progressing Goal: Diagnostic test results will improve Outcome: Progressing Goal: Respiratory complications will improve Outcome: Progressing Goal: Cardiovascular complication will be avoided Outcome: Progressing   Problem: Activity: Goal: Risk for activity intolerance will decrease Outcome: Progressing   Problem: Nutrition: Goal: Adequate nutrition will be maintained Outcome: Progressing   Problem: Coping: Goal: Level of anxiety will decrease Outcome: Progressing   Problem: Elimination: Goal: Will not experience complications related to bowel motility Outcome: Progressing Goal: Will not experience complications related to urinary retention Outcome: Progressing   Problem: Pain Managment: Goal: General experience of comfort will improve Outcome: Progressing   Problem: Safety: Goal: Ability to remain free from injury will improve Outcome: Progressing   Problem: Skin Integrity: Goal: Risk for impaired skin integrity will decrease Outcome: Progressing

## 2023-01-01 NOTE — Progress Notes (Signed)
PHARMACIST LIPID MONITORING  Rebecca Park is a 62 y.o. female admitted on 12/31/2022 for left carotid endarterectomy.  Pharmacy has been consulted to optimize lipid-lowering therapy with the indication of secondary prevention for clinical ASCVD.  Recent Labs:  Lipid Panel (last 6 months):   Lab Results  Component Value Date   CHOL 114 01/01/2023   TRIG 66 01/01/2023   HDL 38 (L) 01/01/2023   CHOLHDL 3.0 01/01/2023   VLDL 13 01/01/2023   LDLCALC 63 01/01/2023    Hepatic function panel (last 6 months):   Lab Results  Component Value Date   AST 25 12/27/2022   ALT 29 12/27/2022   ALKPHOS 148 (H) 12/27/2022   BILITOT 0.8 12/27/2022    SCr (since admission):   Serum creatinine: 1.09 mg/dL (H) 16/10/96 0454 Estimated creatinine clearance: 64.5 mL/min (A)  Current therapy and lipid therapy tolerance Current lipid-lowering therapy: atorvastatin 40mg  Previous lipid-lowering therapies (if applicable): N/A Documented or reported allergies or intolerances to lipid-lowering therapies (if applicable): N/A  Assessment:   No changes in lipid-lowering therapy at this time due to LDL being at goal of < 70 (63).  Plan:    1.Statin intensity (high intensity recommended for all patients regardless of the LDL):  No statin changes. The patient is already on a high intensity statin.  2.Add ezetimibe (if any one of the following):   Not indicated at this time.  3.Refer to lipid clinic:   No  4.Follow-up with:  Primary care provider - Patrice Paradise, MD  5.Follow-up labs after discharge:  No changes in lipid therapy, repeat a lipid panel in one year.     Nicole Kindred, PharmD PGY1 Pharmacy Resident 01/01/2023 7:22 AM

## 2023-01-03 ENCOUNTER — Encounter (HOSPITAL_COMMUNITY): Payer: Self-pay | Admitting: Vascular Surgery

## 2023-01-03 NOTE — Discharge Summary (Signed)
Discharge Summary     AYMEE DENMON 1960/07/15 62 y.o. female  [id number]  Admission Date: 12/31/2022  Discharge Date: 01/01/2023  Physician: Lemar Livings, MD  Admission Diagnosis: Carotid artery stenosis [I65.29] Symptomatic carotid artery stenosis, left [I65.22]  Discharge Day Diagnosis: Carotid artery stenosis [I65.29] Symptomatic carotid artery stenosis, left [facility]  Hospital Course:  The patient was admitted to the hospital and taken to the operating room on 12/31/2022 and underwent left carotid endarterectomy. The pt tolerated the procedure well and was transported to the PACU in good condition.   By POD 1, the pt neuro status was intact. Her hemoglobin was stable at 11 without signs of bleeding. She was voiding without difficulty and tolerating a normal diet.   She ambulated without difficulty and was discharged home.    Recent Labs    01/01/23 0405  NA 136  K 4.5  CL 104  CO2 21*  GLUCOSE 216*  BUN 29*  CALCIUM 8.7*   Recent Labs    01/01/23 0405  WBC 17.2*  HGB 11.0*  HCT 33.0*  PLT 182   No results for input(s): "INR" in the last 72 hours.   Discharge Instructions     Call MD for:  redness, tenderness, or signs of infection (pain, swelling, redness, odor or green/yellow discharge around incision site)   Complete by: As directed    Call MD for:  severe uncontrolled pain   Complete by: As directed    Call MD for:  temperature >100.4   Complete by: As directed    Diet - low sodium heart healthy   Complete by: As directed    Discharge wound care:   Complete by: As directed    Wash your incision daily with antibacterial soap and warm water, then pat dry.  Do not place any ointments or creams on your incision.   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   Severe sepsis with acute organ dysfunction due to methicillin susceptible Staphylococcus aureus (MSSA) (HCC) 03/21/2021   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   aspirin EC 81 MG tablet Take 1 tablet (81 mg total) by mouth daily. Swallow whole.   atorvastatin 40 MG tablet Commonly known as: LIPITOR Take 1 tablet (40 mg total) by mouth daily.   clopidogrel 75 MG tablet Commonly known as: PLAVIX Take 1 tablet (75 mg total) by mouth daily.  glipiZIDE 10 MG tablet Commonly known as: GLUCOTROL Take 10 mg by mouth 2 (two) times daily before a meal.   losartan-hydrochlorothiazide 100-12.5 MG tablet Commonly known as: HYZAAR Take 1 tablet by mouth daily.   metoprolol succinate 25 MG 24 hr tablet Commonly known as: TOPROL-XL Take 25 mg by mouth daily.   Mounjaro 7.5 MG/0.5ML Pen Generic drug: tirzepatide Inject 7.5 mg into the skin once a week. Notes to patient: Take as you were prior to admission    pantoprazole 40 MG tablet Commonly known as: PROTONIX Take 1 tablet (40 mg total) by mouth 2 (two) times daily.                Discharge Care Instructions  (From admission, onward)           Start     Ordered   01/01/23 0000  Discharge wound care:       Comments: Wash your incision daily with antibacterial soap and warm water, then pat dry.  Do not place any ointments or creams on your incision.   01/01/23 0822             Discharge Instructions:   [facility] Discharge Instructions Carotid Revascularization  Please refer to the following instructions for your post-procedure care. Your surgeon or physician assistant will discuss any changes with you.  Activity  You are encouraged to walk as much as you can. You can slowly return to normal activities but must avoid strenuous activity and heavy lifting until your doctor tell you it's OK. Avoid activities such as vacuuming or swinging a golf club. You can drive after one week if you are comfortable and you are no longer taking prescription pain medications. It is normal to feel tired for serval weeks after your surgery. It is also normal to have difficulty with sleep habits, eating, and bowel movements after surgery. These will go away with time.  Bathing/Showering  You may shower after you come home. Do not soak in a bathtub, hot tub, or swim until the incision heals completely.  Incision Care  Shower every day. Clean your incision with mild soap and water. Pat the area dry with a clean towel. You do not need a bandage unless otherwise instructed. Do not apply any ointments or creams to your incision. You may have skin glue on your incision. Do not peel it off. It will come off on its own in about one week. Your incision may feel thickened and raised for several weeks after your surgery. This is normal and the skin will soften over time. For Men Only: It's OK to shave around the incision but do not shave the incision itself for 2 weeks. It is common to have numbness under your chin that could last for  several months.  Diet  Resume your normal diet. There are no special food restrictions following this procedure. A low fat/low cholesterol diet is recommended for all patients with vascular disease. In order to heal from your surgery, it is CRITICAL to get adequate nutrition. Your body requires vitamins, minerals, and protein. Vegetables are the best source of vitamins and minerals. Vegetables also provide the perfect balance of protein. Processed food has little nutritional value, so try to avoid this.  Medications  Resume taking all of your medications unless your doctor or physician assistant tells you not to.  If your incision is causing pain, you may take over-the- counter pain relievers such as acetaminophen (Tylenol). If you  were prescribed a stronger pain medication, please be aware these medications can cause nausea and constipation.  Prevent nausea by taking the medication with a snack or meal. Avoid constipation by drinking plenty of fluids and eating foods with a high amount of fiber, such as fruits, vegetables, and grains. Do not take Tylenol if you are taking prescription pain medications.  Follow Up  Our office will schedule a follow up appointment 2-3 weeks following discharge.  Please call us immediately for any of the following conditions  Increased pain, redness, drainage (pus) from your incision site. Fever of 101 degrees or higher. If you should develop stroke (slurred speech, difficulty swallowing, weakness on one side of your body, loss of vision) you should call 911 and go to the nearest emergency room.  Reduce your risk of vascular disease:  Stop smoking. If you would like help call [facility] at 1-800-QUIT-NOW ((639) 061-4868) or [place] at 3616719254. Manage your cholesterol Maintain a desired weight Control your diabetes Keep your blood pressure down  If you have any questions, please call the office at [phone].  Disposition: Home  Patient's  condition: is Good  Follow up: 1. [facility] in 2 weeks.   Loel Dubonnet, PA-C [facility] [phone]   --- For [facility] Registry use ---   Modified Rankin score at D/C (0-6): 0  IV medication needed for:  1. Hypertension: No 2. Hypotension: No  Post-op Complications: No  1. Post-op CVA or TIA: No  If yes: Event classification (right eye, left eye, right cortical, left cortical, verterobasilar, other):   If yes: Timing of event (intra-op, <6 hrs post-op, >=6 hrs post-op, unknown):   2. CN injury: No  If yes: CN  injuried   3. Myocardial infarction: No  If yes: Dx by (EKG or clinical, Troponin):   4.  CHF: No  5.  Dysrhythmia (new): No  6. Wound infection: No  7. Reperfusion symptoms: No  8. Return to OR: No  If yes: return to OR for (bleeding, neurologic, other CEA incision, other):   Discharge medications: Statin use:  Yes ASA use:  Yes   Beta blocker use:  Yes ACE-Inhibitor use:  No  ARB use:  Yes CCB use: Yes P2Y12 Antagonist use: Yes, [x ] Plavix, [ ]  Plasugrel, [ ]  Ticlopinine, [ ]  Ticagrelor, [ ]  Other, [ ]  No for medical reason, [ ]  Non-compliant, [ ]  Not-indicated Anti-coagulant use:  No, [ ]  Warfarin, [ ]  Rivaroxaban, [ ]  Dabigatran,

## 2023-01-10 ENCOUNTER — Telehealth: Payer: Self-pay

## 2023-01-10 NOTE — Telephone Encounter (Signed)
Pt called with c/o nosebleeds, one lasting 3 hours yesterday. Per APP Marisue Humble) pt can d/c the Plavix and is to remain on Aspirin. Pt is aware and verbalized understanding. She has post op f/u scheduled in 2 weeks. No further questions/concerns at this time.

## 2023-01-26 ENCOUNTER — Encounter: Payer: Self-pay | Admitting: Vascular Surgery

## 2023-01-26 ENCOUNTER — Ambulatory Visit (INDEPENDENT_AMBULATORY_CARE_PROVIDER_SITE_OTHER): Payer: PRIVATE HEALTH INSURANCE | Admitting: Vascular Surgery

## 2023-01-26 VITALS — BP 121/82 | HR 92 | Temp 97.8°F | Resp 20 | Ht 67.0 in | Wt 222.0 lb

## 2023-01-26 DIAGNOSIS — I63239 Cerebral infarction due to unspecified occlusion or stenosis of unspecified carotid arteries: Secondary | ICD-10-CM

## 2023-01-26 NOTE — Progress Notes (Signed)
Subjective:     Patient ID: Rebecca Park, female   DOB: 1960/12/03, 62 y.o.   MRN: [id number]  HPI 62 year old female recently underwent left carotid endarterectomy for stroke.  She recovered very quickly from this has no complaints today's visit.  She does have some numbness of her anterior neck   Review of Systems No complaints    Objective:   Physical Exam Vitals:   01/26/23 0940  BP: 121/82  Pulse: 92  Resp: 20  Temp: 97.8 F (36.6 C)  SpO2: 98%    Awake alert oriented Nonlabored respirations Left neck incision healing well    Assessment:     62 year old female status post left carotid endarterectomy for symptomatic disease now recovering well.      Plan:     Follow-up 9 months with carotid duplex  ABI will also be checked at her next follow-up visit although the last one was noncompressible and triphasic she does have a history of left lower extremity amputation.    Mykale Gandolfo C. Randie Heinz, MD [facility] [place] Office: [phone] Pager: [phone]

## 2023-04-05 ENCOUNTER — Telehealth: Payer: Self-pay

## 2023-04-05 NOTE — Telephone Encounter (Signed)
 Delon from [facility] called wanting to know if this patient could proceed with a dental cleaning today as patient told them she is s/p CEA with Dr. Sheree. I reviewed the chart with the PA and Dr. Magda who are in the office today, there are no contraindications as to why the patient can not proceed with the cleaning per the providers. I relayed this information to the dental office.

## 2023-05-11 NOTE — Progress Notes (Signed)
Fax received from [facility] on 05/04/23 for medical clearance/medication hold for dental treatment and cleaning to be signed by B. Randie Heinz, MD.  Provider signed on 05/11/23, scanned into pt's chart, media routed via MyChart to sender on 05/11/23.

## 2023-08-16 ENCOUNTER — Encounter (INDEPENDENT_AMBULATORY_CARE_PROVIDER_SITE_OTHER): Payer: Self-pay

## 2023-09-23 ENCOUNTER — Encounter (INDEPENDENT_AMBULATORY_CARE_PROVIDER_SITE_OTHER): Payer: PRIVATE HEALTH INSURANCE

## 2023-09-23 ENCOUNTER — Ambulatory Visit (INDEPENDENT_AMBULATORY_CARE_PROVIDER_SITE_OTHER): Payer: PRIVATE HEALTH INSURANCE | Admitting: Vascular Surgery

## 2023-10-14 ENCOUNTER — Other Ambulatory Visit: Payer: Self-pay

## 2023-10-14 DIAGNOSIS — Z89511 Acquired absence of right leg below knee: Secondary | ICD-10-CM

## 2023-10-14 DIAGNOSIS — I6522 Occlusion and stenosis of left carotid artery: Secondary | ICD-10-CM

## 2023-10-18 ENCOUNTER — Other Ambulatory Visit: Payer: Self-pay | Admitting: Physician Assistant

## 2023-10-18 DIAGNOSIS — Z1231 Encounter for screening mammogram for malignant neoplasm of breast: Secondary | ICD-10-CM

## 2023-11-02 ENCOUNTER — Ambulatory Visit (HOSPITAL_COMMUNITY)
Admission: RE | Admit: 2023-11-02 | Discharge: 2023-11-02 | Disposition: A | Discharge status: Home / Self Care | Payer: PRIVATE HEALTH INSURANCE | Source: Ambulatory Visit | Attending: Vascular Surgery | Admitting: Vascular Surgery

## 2023-11-02 ENCOUNTER — Ambulatory Visit (HOSPITAL_BASED_OUTPATIENT_CLINIC_OR_DEPARTMENT_OTHER)
Admission: RE | Admit: 2023-11-02 | Discharge: 2023-11-02 | Disposition: A | Discharge status: Home / Self Care | Payer: PRIVATE HEALTH INSURANCE | Source: Ambulatory Visit | Attending: Vascular Surgery | Admitting: Vascular Surgery

## 2023-11-02 ENCOUNTER — Ambulatory Visit: Payer: PRIVATE HEALTH INSURANCE | Attending: Vascular Surgery | Admitting: Vascular Surgery

## 2023-11-02 ENCOUNTER — Encounter: Payer: Self-pay | Admitting: Vascular Surgery

## 2023-11-02 VITALS — BP 133/80 | HR 74 | Temp 97.7°F | Ht 67.0 in | Wt 217.0 lb

## 2023-11-02 DIAGNOSIS — Z9889 Other specified postprocedural states: Secondary | ICD-10-CM

## 2023-11-02 DIAGNOSIS — Z89511 Acquired absence of right leg below knee: Secondary | ICD-10-CM

## 2023-11-02 DIAGNOSIS — I63239 Cerebral infarction due to unspecified occlusion or stenosis of unspecified carotid arteries: Secondary | ICD-10-CM

## 2023-11-02 DIAGNOSIS — Z89612 Acquired absence of left leg above knee: Secondary | ICD-10-CM | POA: Diagnosis not present

## 2023-11-02 DIAGNOSIS — I70201 Unspecified atherosclerosis of native arteries of extremities, right leg: Secondary | ICD-10-CM | POA: Diagnosis not present

## 2023-11-02 DIAGNOSIS — I6522 Occlusion and stenosis of left carotid artery: Secondary | ICD-10-CM

## 2023-11-02 DIAGNOSIS — I63232 Cerebral infarction due to unspecified occlusion or stenosis of left carotid arteries: Secondary | ICD-10-CM

## 2023-11-02 DIAGNOSIS — Z89512 Acquired absence of left leg below knee: Secondary | ICD-10-CM

## 2023-11-02 LAB — VAS US ABI WITH/WO TBI

## 2023-11-02 NOTE — Progress Notes (Signed)
 Patient ID: Rebecca Park, female   DOB: 04/16/1960, 63 y.o.   MRN: [id number]  Reason for Consult: Follow-up   Referred by Marikay Eva POUR, PA  Subjective:     HPI:  Rebecca Park is a 63 y.o. 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  She has undergone left carotid endarterectomy 10 months ago for which she has recovered well with only minor numbness of her left chin area.  She also has a left lower extremity amputation she continues to walk well with the help of a cane and a prosthetic.  She does not have any right lower extremity issues.  No complaints related to today's visit.  Past Medical History:  Diagnosis Date   Diabetes mellitus without complication (HCC)    Hypertension    PONV (postoperative nausea and vomiting)    the day after rt 5th toe amputation surgery   Stroke (HCC) 11/23/2022   Family History  Problem Relation Age of Onset   Heart disease Father    Heart attack Father    Heart disease Maternal Grandfather    Breast cancer Neg Hx    Past Surgical History:  Procedure Laterality Date   AMPUTATION Right 11/10/2018   Procedure: AMPUTATION RAY (amputation 5th metatarsal Right foot;  Surgeon: Lennie Barter, DPM;  Location: [facility];  Service: Podiatry;  Laterality: Right;   AMPUTATION Left 03/15/2021   Procedure: AMPUTATION BELOW KNEE;  Surgeon: Clarice Martin, MD;  Location: [facility];  Service: Vascular;  Laterality: Left;   AMPUTATION TOE Right 12/05/2018   Procedure: RAY RIGHT 4TH AMPUTATION;  Surgeon: Lennie Barter, DPM;  Location: [facility];  Service: Podiatry;  Laterality: Right;  mac with local DIABETIC-oral meds   BONE BIOPSY Right 11/10/2018   Procedure: BONE BIOPSY- Right 4th toe;  Surgeon: Lennie Barter, DPM;  Location: [facility];  Service: Podiatry;  Laterality: Right;   BUNIONECTOMY Bilateral    COLONOSCOPY WITH PROPOFOL  N/A 09/07/2018   Procedure: COLONOSCOPY WITH PROPOFOL ;  Surgeon: Unk Corinn Skiff, MD;  Location: [facility];  Service: Gastroenterology;  Laterality: N/A;   COLONOSCOPY WITH PROPOFOL  N/A 09/17/2019   Procedure: COLONOSCOPY WITH PROPOFOL ;  Surgeon: Unk Corinn Skiff, MD;  Location: [facility] END[facility];  Service: Gastroenterology;  Laterality: N/A;   ENDARTERECTOMY Left 12/31/2022   Procedure: ENDARTERECTOMY CAROTID Left;  Surgeon: Sheree Penne Bruckner, MD;  Location: [facility];  Service: Vascular;  Laterality: Left;   TEE WITHOUT CARDIOVERSION N/A 03/18/2021   Procedure: TRANSESOPHAGEAL ECHOCARDIOGRAM (TEE);  Surgeon: Perla Evalene PARAS, MD;  Location: [facility];  Service: Cardiovascular;  Laterality: N/A;    Short Social History:  Social History   Tobacco Use   Smoking status: Never   Smokeless tobacco: Never  Substance Use Topics   Alcohol use: Not Currently    Comment: 1/week but haven't in last month    No Known Allergies  Current Outpatient Medications  Medication Sig Dispense Refill   amLODipine  (NORVASC ) 5 MG tablet Take 5 mg by mouth daily.     aspirin  EC 81 MG tablet Take 1 tablet (81 mg total) by mouth daily. Swallow whole. 30 tablet 1   atorvastatin  (LIPITOR) 40 MG tablet Take 1 tablet (40 mg total) by mouth daily. 30 tablet 2   glipiZIDE  (GLUCOTROL ) 10 MG tablet Take 10 mg by mouth 2 (two) times daily before a meal.     losartan -hydrochlorothiazide  (HYZAAR) 100-12.5 MG tablet Take 1 tablet by mouth daily.  metoprolol  succinate (TOPROL -XL) 25 MG 24 hr tablet Take 25 mg by mouth daily.     MOUNJARO 7.5 MG/0.5ML Pen Inject 7.5 mg into the skin once a week.     No current facility-administered medications for this visit.    Review of Systems  Constitutional:  Constitutional negative. HENT: HENT negative.  Eyes: Eyes negative.  Respiratory: Respiratory negative.  Cardiovascular: Cardiovascular negative.  GI: Gastrointestinal negative.  Musculoskeletal: Musculoskeletal negative.  Skin: Skin negative.  Neurological: Positive for numbness.   Hematologic: Hematologic/lymphatic negative.  Psychiatric: Psychiatric negative.        Objective:  Objective   Vitals:   11/02/23 0840 11/02/23 0842  BP: 134/80 133/80  Pulse: 74   Temp: 97.7 F (36.5 C)   SpO2: 96%   Weight: 217 lb (98.4 kg)   Height: 5' 7 (1.702 m)    Body mass index is 33.99 kg/m.  Physical Exam HENT:     Head: Normocephalic.     Nose: Nose normal.  Eyes:     Pupils: Pupils are equal, round, and reactive to light.  Neck:     Comments: Well-healed left neck incision Cardiovascular:     Pulses: Normal pulses.  Pulmonary:     Effort: Pulmonary effort is normal.  Abdominal:     General: Abdomen is flat.  Musculoskeletal:        General: Normal range of motion.     Comments: Left lower extremity prosthetic  Skin:    General: Skin is warm.     Capillary Refill: Capillary refill takes less than 2 seconds.  Neurological:     Mental Status: She is alert.     Data: ABI Findings:  +---------+------------------+-----+-----------+--------+  Right   Rt Pressure (mmHg)IndexWaveform   Comment   +---------+------------------+-----+-----------+--------+  Brachial 139                                         +---------+------------------+-----+-----------+--------+  ATA     255               1.73 multiphasic          +---------+------------------+-----+-----------+--------+  PTA     255               1.73 multiphasic          +---------+------------------+-----+-----------+--------+  Great Toe125               0.85                      +---------+------------------+-----+-----------+--------+   +--------+------------------+-----+--------+-------+  Left   Lt Pressure (mmHg)IndexWaveformComment  +--------+------------------+-----+--------+-------+  [id number]                                    +--------+------------------+-----+--------+-------+   +-------+-----------+-----------+------------+------------+   ABI/TBIToday's ABIToday's TBIPrevious ABIPrevious TBI  +-------+-----------+-----------+------------+------------+  Right [place]         1.09       amputation                +-------+-----------+-----------+------------+------------+  Left  [place]         0.85       amputation                +-------+-----------+-----------+------------+------------+        Previous ABI at [facility] on 09/17/22.  Summary:  Right: Resting right ankle-brachial index indicates noncompressible right  lower extremity arteries. The right toe-brachial index is normal.   Right Carotid Findings:  +----------+--------+--------+--------+-------------------------+--------+           PSV cm/sEDV cm/sStenosisPlaque Description       Comments  +----------+--------+--------+--------+-------------------------+--------+  CCA Prox  99      24                                                 +----------+--------+--------+--------+-------------------------+--------+  CCA Mid   74      19                                                 +----------+--------+--------+--------+-------------------------+--------+  CCA Distal73      23                                                 +----------+--------+--------+--------+-------------------------+--------+  ICA Prox  65      19      1-39%   heterogenous and calcific          +----------+--------+--------+--------+-------------------------+--------+  ICA Mid   91      31                                                 +----------+--------+--------+--------+-------------------------+--------+  ICA Distal90      29                                                 +----------+--------+--------+--------+-------------------------+--------+  ECA      89      12                                                 +----------+--------+--------+--------+-------------------------+--------+    +----------+--------+-------+----------------+-------------------+           PSV cm/sEDV cmsDescribe        Arm Pressure (mmHG)  +----------+--------+-------+----------------+-------------------+  [id number]     8      Multiphasic, TWO860                  +----------+--------+-------+----------------+-------------------+   +---------+--------+--+--------+--+---------+  VertebralPSV cm/s67EDV cm/s19Antegrade  +---------+--------+--+--------+--+---------+      Left Carotid Findings:  +----------+--------+--------+--------+------------------+--------+           PSV cm/sEDV cm/sStenosisPlaque DescriptionComments  +----------+--------+--------+--------+------------------+--------+  CCA Prox  100     27                                          +----------+--------+--------+--------+------------------+--------+  CCA Mid   89  29                                          +----------+--------+--------+--------+------------------+--------+  CCA Distal89      20                                          +----------+--------+--------+--------+------------------+--------+  ICA Prox  78      21      Normal                              +----------+--------+--------+--------+------------------+--------+  ICA Mid   101     38                                          +----------+--------+--------+--------+------------------+--------+  ICA Distal118     41                                          +----------+--------+--------+--------+------------------+--------+  ECA      95      14                                          +----------+--------+--------+--------+------------------+--------+   +----------+--------+--------+----------------+-------------------+           PSV cm/sEDV cm/sDescribe        Arm Pressure (mmHG)  +----------+--------+--------+----------------+-------------------+  Dlarojcpjw34     3        Multiphasic, TWO852                  +----------+--------+--------+----------------+-------------------+   +---------+--------+--+--------+--+---------+  VertebralPSV cm/s47EDV cm/s16Antegrade  +---------+--------+--+--------+--+---------+         Summary:  Right Carotid: Velocities in the right ICA are consistent with a 1-39%  stenosis.   Left Carotid: Patent CEA with no stenosis.   Vertebrals:  Bilateral vertebral arteries demonstrate antegrade flow.  Subclavians: Normal flow hemodynamics were seen in bilateral subclavian               arteries.      Assessment/Plan:     63 year old female with patent left carotid endarterectomy site and well-perfused right lower extremity with a history of a left lower extremity amputation.  Plan will be to follow-up in 1 year with repeat noninvasive studies.     Penne Lonni Colorado MD [facility]

## 2023-11-10 ENCOUNTER — Other Ambulatory Visit: Payer: Self-pay

## 2023-11-10 ENCOUNTER — Emergency Department
Admission: EM | Admit: 2023-11-10 | Discharge: 2023-11-10 | Disposition: A | Discharge status: Home / Self Care | Payer: PRIVATE HEALTH INSURANCE | Attending: Emergency Medicine | Admitting: Emergency Medicine

## 2023-11-10 DIAGNOSIS — R319 Hematuria, unspecified: Secondary | ICD-10-CM | POA: Insufficient documentation

## 2023-11-10 DIAGNOSIS — Z8673 Personal history of transient ischemic attack (TIA), and cerebral infarction without residual deficits: Secondary | ICD-10-CM | POA: Insufficient documentation

## 2023-11-10 DIAGNOSIS — E119 Type 2 diabetes mellitus without complications: Secondary | ICD-10-CM | POA: Insufficient documentation

## 2023-11-10 DIAGNOSIS — Z7982 Long term (current) use of aspirin: Secondary | ICD-10-CM | POA: Diagnosis not present

## 2023-11-10 DIAGNOSIS — N83209 Unspecified ovarian cyst, unspecified side: Secondary | ICD-10-CM | POA: Diagnosis not present

## 2023-11-10 DIAGNOSIS — N3091 Cystitis, unspecified with hematuria: Secondary | ICD-10-CM

## 2023-11-10 LAB — BASIC METABOLIC PANEL WITH GFR
Anion gap: 10 (ref 5–15)
BUN: 26 mg/dL — ABNORMAL HIGH (ref 8–23)
CO2: 23 mmol/L (ref 22–32)
Calcium: 8.6 mg/dL — ABNORMAL LOW (ref 8.9–10.3)
Chloride: 102 mmol/L (ref 98–111)
Creatinine, Ser: 0.87 mg/dL (ref 0.44–1.00)
GFR, Estimated: >60 mL/min (ref >60)
Glucose, Bld: 128 mg/dL — ABNORMAL HIGH (ref 70–99)
Potassium: 4.7 mmol/L (ref 3.5–5.1)
Sodium: 135 mmol/L (ref 135–145)

## 2023-11-10 LAB — URINALYSIS, ROUTINE W REFLEX MICROSCOPIC
Bilirubin Urine: NEGATIVE
Glucose, UA: 50 mg/dL — AB
Ketones, ur: NEGATIVE mg/dL
Nitrite: NEGATIVE
Protein, ur: 100 mg/dL — AB
RBC / HPF: >50 RBC/hpf (ref 0–5)
Specific Gravity, Urine: 1.008 (ref 1.005–1.030)
WBC, UA: >50 WBC/hpf (ref 0–5)
pH: 6 (ref 5.0–8.0)

## 2023-11-10 LAB — CBC WITH DIFFERENTIAL/PLATELET
Abs Immature Granulocytes: 0.04 K/uL (ref 0.00–0.07)
Basophils Absolute: 0.1 K/uL (ref 0.0–0.1)
Basophils Relative: 1 %
Eosinophils Absolute: 0.2 K/uL (ref 0.0–0.5)
Eosinophils Relative: 2 %
HCT: 36.9 % (ref 36.0–46.0)
Hemoglobin: 11.6 g/dL — ABNORMAL LOW (ref 12.0–15.0)
Immature Granulocytes: 0 %
Lymphocytes Relative: 13 %
Lymphs Abs: 1.3 K/uL (ref 0.7–4.0)
MCH: 27 pg (ref 26.0–34.0)
MCHC: 31.4 g/dL (ref 30.0–36.0)
MCV: 86 fL (ref 80.0–100.0)
Monocytes Absolute: 0.5 K/uL (ref 0.1–1.0)
Monocytes Relative: 5 %
Neutro Abs: 8.2 K/uL — ABNORMAL HIGH (ref 1.7–7.7)
Neutrophils Relative %: 79 %
Platelets: 205 K/uL (ref 150–400)
RBC: 4.29 MIL/uL (ref 3.87–5.11)
RDW: 14.3 % (ref 11.5–15.5)
WBC: 10.3 K/uL (ref 4.0–10.5)
nRBC: 0 % (ref 0.0–0.2)

## 2023-11-10 MED ORDER — CEFDINIR 300 MG PO CAPS: 300.0000 mg | ORAL_CAPSULE | Freq: Two times a day (BID) | ORAL | 0 refills | Status: AC

## 2023-11-10 NOTE — ED Provider Notes (Signed)
 [facility] Provider Note    Event Date/Time   First MD Initiated Contact with Patient 11/10/23 [phone]     (approximate)   History   Hematuria   HPI  Rebecca Park is a 63 y.o. female history of stroke, on aspirin  status post carotid endarterectomy with vascular surgery, diabetes who comes in with hematuria.  Patient reports noticing blood in her urine around 2 AM this morning and again at 4 AM.  She denies ever having this previously.  Denies any back pain history of kidney stones.  She does report having some urinary dysuria for the past couple of days.  She does report that she is seeing her primary care doctor for before and they told her that there was no infection.  She denies any fevers.   Physical Exam   Triage Vital Signs: ED Triage Vitals  Encounter Vitals Group     BP 11/10/23 0621 123/70     Girls Systolic BP Percentile --      Girls Diastolic BP Percentile --      Boys Systolic BP Percentile --      Boys Diastolic BP Percentile --      Pulse Rate 11/10/23 0621 90     Resp 11/10/23 0621 16     Temp 11/10/23 0621 98.7 F (37.1 C)     Temp src --      SpO2 11/10/23 0621 99 %     Weight 11/10/23 0620 218 lb (98.9 kg)     Height 11/10/23 0620 5' 7 (1.702 m)     Head Circumference --      Peak Flow --      Pain Score 11/10/23 0620 0     Pain Loc --      Pain Education --      Exclude from Growth Chart --     Most recent vital signs: Vitals:   11/10/23 0621  BP: 123/70  Pulse: 90  Resp: 16  Temp: 98.7 F (37.1 C)  SpO2: 99%     General: Awake, no distress. Well appearing. CV:  Good peripheral perfusion.  Resp:  Normal effort.  Abd:  No distention.  Soft and nontender Other:  No flank tenderness.   ED Results / Procedures / Treatments   Labs (all labs ordered are listed, but only abnormal results are displayed) Labs Reviewed  CBC WITH DIFFERENTIAL/PLATELET - Abnormal; Notable for the following components:      Result  Value   Hemoglobin 11.6 (*)    Neutro Abs 8.2 (*)    All other components within normal limits  BASIC METABOLIC PANEL WITH GFR - Abnormal; Notable for the following components:   Glucose, Bld 128 (*)    BUN 26 (*)    Calcium  8.6 (*)    All other components within normal limits  URINALYSIS, ROUTINE W REFLEX MICROSCOPIC - Abnormal; Notable for the following components:   Color, Urine RED (*)    APPearance CLOUDY (*)    Glucose, UA 50 (*)    Hgb urine dipstick LARGE (*)    Protein, ur 100 (*)    Leukocytes,Ua MODERATE (*)    Bacteria, UA MANY (*)    All other components within normal limits  URINE CULTURE   PROCEDURES:  Critical Care performed: No  Procedures   MEDICATIONS ORDERED IN ED: Medications - No data to display   IMPRESSION / MDM / ASSESSMENT AND PLAN / ED COURSE  I reviewed the triage  vital signs and the nursing notes.   Patient's presentation is most consistent with acute presentation with potential threat to life or bodily function.   Patient comes in with hematuria.  Differential includes UTI, kidney stone, bladder infection, retention, AKI, anemia  CBC shows stable hemoglobin at 11.6.  Does have a little bit of a left shift but white count is normal and patient does not meet any sepsis criteria.  BMP shows stable creatinine at 0.87.  Her urine does look consistent with UTI with significant amount of RBCs and WBCs and many bacteria.  This was sent for urine culture.  Postvoid bladder scan was normal and I did do a bedside ultrasound and I did confirm that there is no obvious signs of retention.  Discussed with patient CT imaging to evaluate for kidney stone.  We discussed that if patient did have a fever with a kidney stone that she could get more sick requiring stent placement.  However at this time she denies any fever denies any flank pain denies any abdominal pain at all.  She did report some symptoms of UTI prior to having hematuria so I do suspect this is more  likely a cystitis.  Patient would like to hold off on CT imaging but she will return if she develops any fever, retention, flank pain for evaluation with CT imaging.  Think that this is reasonable as she will follow-up closely with urology for discussion for cystoscopy.  I reviewed and she has got no recent UTI cultures but I will cover more broadly with cefdinir  given the hemorrhagic cystitis.   The patient is on the cardiac monitor to evaluate for evidence of arrhythmia and/or significant heart rate changes.      FINAL CLINICAL IMPRESSION(S) / ED DIAGNOSES   Final diagnoses:  Hematuria, unspecified type  Hemorrhagic cystitis     Rx / DC Orders   ED Discharge Orders          Ordered    cefdinir  (OMNICEF ) 300 MG capsule  2 times daily        11/10/23 0809             Note:  This document was prepared using Dragon voice recognition software and may include unintentional dictation errors.   Ernest Ronal BRAVO, MD 11/10/23 [phone]

## 2023-11-10 NOTE — ED Notes (Signed)
 See triage note  Presents with some blood in urine. States she noticed blood this am around 2 am and then again around 4am  Denies any pain or other urinary sxs'

## 2023-11-10 NOTE — Discharge Instructions (Addendum)
 We are treating you for a UTI that is causing some blood in your urine.  However if you develop difficulties in getting your urine out, fevers, flank pain we discussed returning for evaluation for Foley placement, CT to rule out kidney stone. At this time we are holding off on this given exam and blood work.  At this time however we can trial the antibiotics and see if this helps your symptoms.  You should call urology to make a close follow-up and return to the ER for these issues or any other concerns

## 2023-11-10 NOTE — ED Triage Notes (Signed)
 Pt reports onset of hematuria this morning, pt denies dysuria or flank pain denies hx kidney stones. Pt is not on blood thinners

## 2023-11-10 NOTE — ED Notes (Signed)
 Bladder scan was 39.

## 2023-11-18 LAB — URINE CULTURE: Culture: 20000 — AB
